# Patient Record
Sex: Male | Born: 1940 | Race: White | Hispanic: No | Marital: Married | Smoking: Heavy tobacco smoker
Health system: Southern US, Community
[De-identification: ages and names within clinical notes are randomized; demographics above are authoritative.]

## PROBLEM LIST (undated history)

## (undated) DIAGNOSIS — K5790 Diverticulosis of intestine, part unspecified, without perforation or abscess without bleeding: Secondary | ICD-10-CM

## (undated) DIAGNOSIS — I739 Peripheral vascular disease, unspecified: Secondary | ICD-10-CM

## (undated) DIAGNOSIS — N529 Male erectile dysfunction, unspecified: Secondary | ICD-10-CM

## (undated) DIAGNOSIS — J449 Chronic obstructive pulmonary disease, unspecified: Secondary | ICD-10-CM

## (undated) DIAGNOSIS — M1990 Unspecified osteoarthritis, unspecified site: Secondary | ICD-10-CM

## (undated) DIAGNOSIS — N200 Calculus of kidney: Secondary | ICD-10-CM

## (undated) DIAGNOSIS — I82409 Acute embolism and thrombosis of unspecified deep veins of unspecified lower extremity: Secondary | ICD-10-CM

## (undated) DIAGNOSIS — F419 Anxiety disorder, unspecified: Secondary | ICD-10-CM

## (undated) DIAGNOSIS — K2270 Barrett's esophagus without dysplasia: Secondary | ICD-10-CM

## (undated) DIAGNOSIS — I714 Abdominal aortic aneurysm, without rupture, unspecified: Secondary | ICD-10-CM

## (undated) DIAGNOSIS — M48061 Spinal stenosis, lumbar region without neurogenic claudication: Secondary | ICD-10-CM

## (undated) DIAGNOSIS — I10 Essential (primary) hypertension: Secondary | ICD-10-CM

## (undated) DIAGNOSIS — K449 Diaphragmatic hernia without obstruction or gangrene: Secondary | ICD-10-CM

## (undated) DIAGNOSIS — E785 Hyperlipidemia, unspecified: Secondary | ICD-10-CM

## (undated) DIAGNOSIS — K859 Acute pancreatitis without necrosis or infection, unspecified: Secondary | ICD-10-CM

## (undated) DIAGNOSIS — M109 Gout, unspecified: Secondary | ICD-10-CM

## (undated) DIAGNOSIS — I4891 Unspecified atrial fibrillation: Secondary | ICD-10-CM

## (undated) DIAGNOSIS — Z9289 Personal history of other medical treatment: Secondary | ICD-10-CM

## (undated) DIAGNOSIS — K219 Gastro-esophageal reflux disease without esophagitis: Secondary | ICD-10-CM

## (undated) DIAGNOSIS — I639 Cerebral infarction, unspecified: Secondary | ICD-10-CM

## (undated) DIAGNOSIS — D649 Anemia, unspecified: Secondary | ICD-10-CM

## (undated) DIAGNOSIS — I6529 Occlusion and stenosis of unspecified carotid artery: Secondary | ICD-10-CM

## (undated) HISTORY — DX: Peripheral vascular disease, unspecified: I73.9

## (undated) HISTORY — DX: Barrett's esophagus without dysplasia: K22.70

## (undated) HISTORY — DX: Unspecified atrial fibrillation: I48.91

## (undated) HISTORY — DX: Gastro-esophageal reflux disease without esophagitis: K21.9

## (undated) HISTORY — DX: Abdominal aortic aneurysm, without rupture, unspecified: I71.40

## (undated) HISTORY — DX: Diverticulosis of intestine, part unspecified, without perforation or abscess without bleeding: K57.90

## (undated) HISTORY — DX: Diaphragmatic hernia without obstruction or gangrene: K44.9

## (undated) HISTORY — DX: Personal history of other medical treatment: Z92.89

## (undated) HISTORY — DX: Chronic obstructive pulmonary disease, unspecified: J44.9

## (undated) HISTORY — PX: PR VEIN BYPASS GRAFT,AORTO-FEM-POP: 35551

## (undated) HISTORY — DX: Male erectile dysfunction, unspecified: N52.9

## (undated) HISTORY — DX: Unspecified osteoarthritis, unspecified site: M19.90

## (undated) HISTORY — DX: Abdominal aortic aneurysm, without rupture: I71.4

## (undated) HISTORY — DX: Anxiety disorder, unspecified: F41.9

## (undated) HISTORY — PX: OTHER SURGICAL HISTORY: SHX169

## (undated) HISTORY — DX: Essential (primary) hypertension: I10

## (undated) HISTORY — DX: Anemia, unspecified: D64.9

## (undated) HISTORY — DX: Acute embolism and thrombosis of unspecified deep veins of unspecified lower extremity: I82.409

## (undated) HISTORY — DX: Acute pancreatitis without necrosis or infection, unspecified: K85.90

## (undated) HISTORY — DX: Gout, unspecified: M10.9

## (undated) HISTORY — DX: Occlusion and stenosis of unspecified carotid artery: I65.29

## (undated) HISTORY — DX: Cerebral infarction, unspecified: I63.9

## (undated) HISTORY — DX: Spinal stenosis, lumbar region without neurogenic claudication: M48.061

## (undated) HISTORY — DX: Hyperlipidemia, unspecified: E78.5

---

## 1978-11-15 HISTORY — PX: LUMBAR DISC SURGERY: SHX700

## 1995-02-02 HISTORY — PX: CATARACT EXTRACTION: SUR2

## 1995-04-20 HISTORY — PX: CATARACT EXTRACTION: SUR2

## 1998-04-15 ENCOUNTER — Inpatient Hospital Stay (HOSPITAL_COMMUNITY): Admission: RE | Admit: 1998-04-15 | Discharge: 1998-04-18 | Payer: Self-pay | Admitting: Vascular Surgery

## 1998-10-07 ENCOUNTER — Ambulatory Visit: Admission: RE | Admit: 1998-10-07 | Discharge: 1998-10-07 | Payer: Self-pay | Admitting: Vascular Surgery

## 1999-04-13 ENCOUNTER — Encounter: Payer: Self-pay | Admitting: Vascular Surgery

## 1999-04-14 ENCOUNTER — Ambulatory Visit: Admission: RE | Admit: 1999-04-14 | Discharge: 1999-04-14 | Payer: Self-pay | Admitting: Vascular Surgery

## 1999-04-16 ENCOUNTER — Encounter: Payer: Self-pay | Admitting: Vascular Surgery

## 1999-04-16 ENCOUNTER — Inpatient Hospital Stay: Admission: RE | Admit: 1999-04-16 | Discharge: 1999-04-18 | Payer: Self-pay | Admitting: Vascular Surgery

## 1999-05-20 ENCOUNTER — Inpatient Hospital Stay (HOSPITAL_COMMUNITY): Admission: EM | Admit: 1999-05-20 | Discharge: 1999-05-23 | Payer: Self-pay | Admitting: Vascular Surgery

## 1999-08-21 ENCOUNTER — Inpatient Hospital Stay: Admission: EM | Admit: 1999-08-21 | Discharge: 1999-08-27 | Payer: Self-pay | Admitting: Vascular Surgery

## 1999-08-22 ENCOUNTER — Encounter: Payer: Self-pay | Admitting: Vascular Surgery

## 1999-08-23 ENCOUNTER — Encounter: Payer: Self-pay | Admitting: Vascular Surgery

## 2000-09-01 ENCOUNTER — Encounter: Admission: RE | Admit: 2000-09-01 | Discharge: 2000-09-01 | Payer: Self-pay | Admitting: Neurosurgery

## 2000-09-01 ENCOUNTER — Encounter: Payer: Self-pay | Admitting: Neurosurgery

## 2000-09-20 ENCOUNTER — Encounter: Payer: Self-pay | Admitting: Vascular Surgery

## 2000-09-20 ENCOUNTER — Inpatient Hospital Stay (HOSPITAL_COMMUNITY): Admission: RE | Admit: 2000-09-20 | Discharge: 2000-09-29 | Payer: Self-pay | Admitting: Vascular Surgery

## 2000-09-21 ENCOUNTER — Encounter: Payer: Self-pay | Admitting: Vascular Surgery

## 2000-11-04 ENCOUNTER — Inpatient Hospital Stay (HOSPITAL_COMMUNITY): Admission: AD | Admit: 2000-11-04 | Discharge: 2000-11-15 | Payer: Self-pay | Admitting: Vascular Surgery

## 2000-11-06 ENCOUNTER — Encounter: Payer: Self-pay | Admitting: Vascular Surgery

## 2000-11-07 ENCOUNTER — Encounter: Payer: Self-pay | Admitting: Vascular Surgery

## 2000-11-15 HISTORY — PX: CAROTID ENDARTERECTOMY: SUR193

## 2000-11-18 ENCOUNTER — Encounter: Payer: Self-pay | Admitting: Vascular Surgery

## 2000-11-18 ENCOUNTER — Inpatient Hospital Stay (HOSPITAL_COMMUNITY): Admission: RE | Admit: 2000-11-18 | Discharge: 2000-11-25 | Payer: Self-pay | Admitting: Vascular Surgery

## 2000-11-18 ENCOUNTER — Encounter (INDEPENDENT_AMBULATORY_CARE_PROVIDER_SITE_OTHER): Payer: Self-pay | Admitting: Specialist

## 2001-06-01 ENCOUNTER — Encounter: Payer: Self-pay | Admitting: Vascular Surgery

## 2001-06-02 ENCOUNTER — Inpatient Hospital Stay (HOSPITAL_COMMUNITY): Admission: RE | Admit: 2001-06-02 | Discharge: 2001-06-03 | Payer: Self-pay | Admitting: Vascular Surgery

## 2001-06-02 ENCOUNTER — Encounter (INDEPENDENT_AMBULATORY_CARE_PROVIDER_SITE_OTHER): Payer: Self-pay | Admitting: Specialist

## 2001-10-06 ENCOUNTER — Ambulatory Visit (HOSPITAL_COMMUNITY): Admission: RE | Admit: 2001-10-06 | Discharge: 2001-10-06 | Payer: Self-pay | Admitting: Cardiovascular Disease

## 2002-11-19 ENCOUNTER — Encounter: Admission: RE | Admit: 2002-11-19 | Discharge: 2002-11-19 | Payer: Self-pay | Admitting: Family Medicine

## 2002-11-19 ENCOUNTER — Encounter: Payer: Self-pay | Admitting: Family Medicine

## 2002-12-07 ENCOUNTER — Inpatient Hospital Stay (HOSPITAL_COMMUNITY): Admission: AD | Admit: 2002-12-07 | Discharge: 2002-12-11 | Payer: Self-pay | Admitting: Family Medicine

## 2002-12-08 ENCOUNTER — Encounter: Payer: Self-pay | Admitting: Family Medicine

## 2002-12-12 ENCOUNTER — Inpatient Hospital Stay (HOSPITAL_COMMUNITY): Admission: RE | Admit: 2002-12-12 | Discharge: 2002-12-20 | Payer: Self-pay | Admitting: Vascular Surgery

## 2002-12-12 ENCOUNTER — Encounter: Payer: Self-pay | Admitting: Vascular Surgery

## 2002-12-24 ENCOUNTER — Inpatient Hospital Stay (HOSPITAL_COMMUNITY): Admission: EM | Admit: 2002-12-24 | Discharge: 2003-01-01 | Payer: Self-pay | Admitting: Emergency Medicine

## 2002-12-27 ENCOUNTER — Encounter: Payer: Self-pay | Admitting: Vascular Surgery

## 2003-12-25 ENCOUNTER — Inpatient Hospital Stay (HOSPITAL_COMMUNITY): Admission: AD | Admit: 2003-12-25 | Discharge: 2004-01-01 | Payer: Self-pay | Admitting: Cardiovascular Disease

## 2005-11-15 HISTORY — PX: PR VEIN BYPASS GRAFT,AORTO-FEM-POP: 35551

## 2005-11-15 HISTORY — PX: CAROTID ENDARTERECTOMY: SUR193

## 2006-03-11 ENCOUNTER — Ambulatory Visit (HOSPITAL_COMMUNITY): Admission: RE | Admit: 2006-03-11 | Discharge: 2006-03-12 | Payer: Self-pay | Admitting: Cardiovascular Disease

## 2006-04-04 ENCOUNTER — Inpatient Hospital Stay (HOSPITAL_COMMUNITY): Admission: RE | Admit: 2006-04-04 | Discharge: 2006-04-05 | Payer: Self-pay | Admitting: Vascular Surgery

## 2006-04-04 ENCOUNTER — Encounter (INDEPENDENT_AMBULATORY_CARE_PROVIDER_SITE_OTHER): Payer: Self-pay | Admitting: Specialist

## 2006-05-02 ENCOUNTER — Ambulatory Visit (HOSPITAL_COMMUNITY): Admission: RE | Admit: 2006-05-02 | Discharge: 2006-05-02 | Payer: Self-pay | Admitting: Vascular Surgery

## 2006-05-09 ENCOUNTER — Ambulatory Visit (HOSPITAL_COMMUNITY): Admission: RE | Admit: 2006-05-09 | Discharge: 2006-05-09 | Payer: Self-pay | Admitting: Vascular Surgery

## 2006-05-09 ENCOUNTER — Encounter: Payer: Self-pay | Admitting: Vascular Surgery

## 2006-05-11 ENCOUNTER — Inpatient Hospital Stay (HOSPITAL_COMMUNITY): Admission: RE | Admit: 2006-05-11 | Discharge: 2006-05-18 | Payer: Self-pay | Admitting: Vascular Surgery

## 2007-03-06 ENCOUNTER — Encounter: Admission: RE | Admit: 2007-03-06 | Discharge: 2007-03-06 | Payer: Self-pay | Admitting: Family Medicine

## 2007-03-06 ENCOUNTER — Ambulatory Visit: Payer: Self-pay | Admitting: Internal Medicine

## 2007-03-29 ENCOUNTER — Ambulatory Visit: Payer: Self-pay | Admitting: Internal Medicine

## 2007-06-26 ENCOUNTER — Ambulatory Visit: Payer: Self-pay | Admitting: Oncology

## 2007-06-26 ENCOUNTER — Inpatient Hospital Stay (HOSPITAL_COMMUNITY): Admission: AD | Admit: 2007-06-26 | Discharge: 2007-06-30 | Payer: Self-pay | Admitting: Internal Medicine

## 2007-07-04 ENCOUNTER — Ambulatory Visit: Payer: Self-pay | Admitting: Internal Medicine

## 2007-07-06 ENCOUNTER — Ambulatory Visit: Payer: Self-pay | Admitting: Internal Medicine

## 2007-08-14 ENCOUNTER — Ambulatory Visit: Payer: Self-pay | Admitting: Surgery

## 2008-08-12 ENCOUNTER — Ambulatory Visit: Payer: Self-pay | Admitting: Surgery

## 2009-02-04 ENCOUNTER — Ambulatory Visit: Payer: Self-pay | Admitting: Surgery

## 2009-09-01 ENCOUNTER — Ambulatory Visit: Payer: Self-pay | Admitting: Vascular Surgery

## 2010-02-13 ENCOUNTER — Ambulatory Visit: Payer: Self-pay | Admitting: Vascular Surgery

## 2010-10-22 ENCOUNTER — Ambulatory Visit: Payer: Self-pay | Admitting: Vascular Surgery

## 2011-01-21 ENCOUNTER — Ambulatory Visit
Admission: RE | Admit: 2011-01-21 | Discharge: 2011-01-21 | Disposition: A | Discharge status: Home / Self Care | Payer: Medicare Other | Source: Ambulatory Visit | Attending: Cardiovascular Disease | Admitting: Cardiovascular Disease

## 2011-01-21 ENCOUNTER — Other Ambulatory Visit: Payer: Self-pay | Admitting: Cardiovascular Disease

## 2011-01-21 DIAGNOSIS — J449 Chronic obstructive pulmonary disease, unspecified: Secondary | ICD-10-CM

## 2011-03-30 NOTE — Procedures (Signed)
BYPASS GRAFT EVALUATION   INDICATION:  Follow up left femoral-to-posterior tibial bypass graft.   HISTORY:  Diabetes:  Yes.  Cardiac:  Yes.  Hypertension:  Yes.  Smoking:  Yes.  Previous Surgery:  Left femoral-to-posterior tibial bypass graft with  composite cephalic vein on 75/64/33 by Dr. Hart Rochester.   SINGLE LEVEL ARTERIAL EXAM                               RIGHT              LEFT  Brachial:                    201                188  Anterior tibial:             123                173  Posterior tibial:            111                165  Peroneal:  Ankle/brachial index:        0.61               0.86   PREVIOUS ABI:  Date: 09/01/09  RIGHT:  0.72  LEFT:  0.97   LOWER EXTREMITY BYPASS GRAFT DUPLEX EXAM:   DUPLEX:  Patent left femoral-to-posterior tibial bypass graft with  elevated velocities of 227 at the proximal anastomosis.   IMPRESSION:  1. Stable ankle brachial indices; however, left ankle brachial index      has decreased.  2. Patent left femoral-to-posterior tibial bypass graft with elevated      velocities as described above.        ___________________________________________  Quita Skye. Hart Rochester, M.D.   CB/MEDQ  D:  02/13/2010  T:  02/13/2010  Job:  [id number]

## 2011-03-30 NOTE — Procedures (Signed)
BYPASS GRAFT EVALUATION   INDICATION:  Follow-up evaluation of lower extremity bypass graft.   HISTORY:  Diabetes:  Yes.  Cardiac:  Yes.  Hypertension:  Yes.  Smoking:  Periodic smoker.  Previous Surgery:  Left fem-to-distal posterior tibial artery bypass  graft with a composite cephalic vein on 30/86/57 by Dr. Hart Rochester.   SINGLE LEVEL ARTERIAL EXAM                               RIGHT              LEFT  Brachial:                    178                176  Anterior tibial:             96  Posterior tibial:            84                 168  Peroneal:  Ankle/brachial index:        0.53               0.94   PREVIOUS ABI:  Date: 08/14/07  RIGHT:  0.52  LEFT:  0.94   LOWER EXTREMITY BYPASS GRAFT DUPLEX EXAM:   DUPLEX:  Doppler arterial waveforms are biphasic proximal to and in the  proximal portion of the bypass graft and are monophasic in the distal  portion of the bypass graft in the distal native posterior tibial  artery.   IMPRESSION:  1. Ankle brachial indices are stable from previous study bilaterally.  2. Patent left femoral-to-posterior tibial artery bypass graft with no      evidence of vein graft stenosis.   ___________________________________________  V. Charlena Cross, MD   MC/MEDQ  D:  08/12/2008  T:  08/12/2008  Job:  [id number]

## 2011-03-30 NOTE — Procedures (Signed)
BYPASS GRAFT EVALUATION   INDICATION:  Follow-up evaluation of left fem-to-posterior tibial artery  bypass graft.   HISTORY:  Diabetes:  Yes.  Cardiac:  Yes.  Hypertension:  Yes.  Smoking:  Yes.  Previous Surgery:  Left fem-to-posterior tibial artery bypass graft with  composite cephalic vein on 60/45/4098 by Dr. Hart Rochester.   SINGLE LEVEL ARTERIAL EXAM                               RIGHT              LEFT  Brachial:                    188                186  Anterior tibial:             96  Posterior tibial:            106                180  Peroneal:  Ankle/brachial index:        0.56               0.96   PREVIOUS ABI:  Date: 08/12/08  RIGHT:  0.53  LEFT:  0.94   LOWER EXTREMITY BYPASS GRAFT DUPLEX EXAM:   DUPLEX:  Doppler arterial waveforms are monophasic proximal to, within,  and distal to the left fem-to-posterior artery bypass with no evidence  of vein graft stenosis.   IMPRESSION:  1. Ankle brachial indices are stable compared to previous study      bilaterally.  2. Patent left femoral to posterior artery bypass graft.     ___________________________________________  V. Charlena Cross, MD   MC/MEDQ  D:  02/04/2009  T:  02/04/2009  Job:  [id number]

## 2011-03-30 NOTE — Procedures (Signed)
CAROTID DUPLEX EXAM   INDICATION:  Known carotid disease.   HISTORY:  Diabetes:  Yes  Cardiac:  Yes  Hypertension:  Yes  Smoking:  Yes  Previous Surgery:  Left CEA in 2001  CV History:  No  Amaurosis Fugax No, Paresthesias No, Hemiparesis No                                       RIGHT             LEFT  Brachial systolic pressure:         167               164  Brachial Doppler waveforms:         WNL               WNL  Vertebral direction of flow:        Antegrade         Antegrade  DUPLEX VELOCITIES (cm/sec)  CCA peak systolic                   57                64  ECA peak systolic                   248               407  ICA peak systolic                   103               83  ICA end diastolic                   27                30  PLAQUE MORPHOLOGY:                  Homogeneous  PLAQUE AMOUNT:                      Mild  PLAQUE LOCATION:                    ICA   IMPRESSION:  1. Right internal carotid artery suggests 20% to 39% stenosis.  2. Left internal carotid artery suggests 0% to 39% stenosis.  3. Bilateral external carotid arteries suggests stenosis.  4. Antegrade flow in bilateral vertebrals.   ___________________________________________  Quita Skye Hart Rochester, M.D.   CB/MEDQ  D:  09/02/2009  T:  09/02/2009  Job:  [id number]

## 2011-03-30 NOTE — Procedures (Signed)
BYPASS GRAFT EVALUATION   INDICATION:  Follow up left fem to posterior tibial artery bypass graft.   HISTORY:  Diabetes:  Yes  Cardiac:  Yes  Hypertension:  Yes  Smoking:  Yes  Previous Surgery:  Left fem to posterior tibial artery bypass graft with  composite cephalic vein on 16/08/9603 by Dr. Hart Rochester.   SINGLE LEVEL ARTERIAL EXAM                               RIGHT              LEFT  Brachial:                    160                152  Anterior tibial:             99                 133  Posterior tibial:            90                 141  Peroneal:  Ankle/brachial index:        0.62               0.88   PREVIOUS ABI:  Date: 02/13/2010  RIGHT:  0.61  LEFT:  0.86   LOWER EXTREMITY BYPASS GRAFT DUPLEX EXAM:   DUPLEX:  Patent fem to posterior tibial artery bypass graft with an area  of increased velocities of 156 cm/sec. in the proximal anastomosis.   IMPRESSION:  1. Patent fem to posterior tibial artery bypass graft with elevated      velocities as noted above.  2. Stable ankle-brachial indices from previous exams.      ___________________________________________  Larina Earthly, M.D.   NT/MEDQ  D:  10/22/2010  T:  10/22/2010  Job:  [id number]

## 2011-03-30 NOTE — H&P (Signed)
[name], [name]                 ACCOUNT NO.:  [id number]   MEDICAL RECORD NO.:  [id number]          PATIENT TYPE:  INP   LOCATION:  6729                         FACILITY:  [facility]   PHYSICIAN:  Michelene Gardener, MD    DATE OF BIRTH:  1941-04-16   DATE OF ADMISSION:  06/26/2007  DATE OF DISCHARGE:                              HISTORY & PHYSICAL   PRIMARY PHYSICIAN:  Robert L. Foy Guadalajara, M.D.   HISTORY OF PRESENT ILLNESS:  This is a 70 year old male with a past  medical history of multiple medical problems including anemia, was sent  by his primary physician for evaluation of anemia.  This patient has  been feeling fatigue in the last few days.  He went and saw his primary  physician around 2 days ago.  At that time, his hemoglobin was low at  9.9.  So, his primary physician told him to go home and then to come in  a few days to recheck his hemoglobin.  As per his wife, he became very  weak, having episodes of lightheadedness and was looking pale.  So, they  took him to see his primary physician and his hemoglobin came to be 6.8.  The patient was sent over to the hospital for further evaluation.  The  patient had a recent EGD and colonoscopy done by [facility], Dr. Yancey Flemings and as per the patient they were negative.   PAST MEDICAL HISTORY:  Significant for:  1. Peripheral vascular disease.  2. Anemia with recurrent transfusion, most of his workup has been      negative.  3. 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  4. Hypertension.  5. Hyperlipidemia.  6. GERD.  7. Anxiety.  8. Diabetes mellitus, type 2.  9. History of gout.   PAST SURGICAL HISTORY:  1. Status post left carotid endarterectomy in 2002.  2. Status post redo left carotid endarterectomy in May 2007.  3. Left femoral to below knee popliteal artery bypass with nonreversed      translocated saphenous vein graft, done in June 1999.  4. Status post Dacron  patch angioplasty of long vein graft stenosis of      the left femoral to popliteal bypass graft in June 2000.  5. Status post left femoral to below knee popliteal artery bypass      graft with 6-mm PTSE graft in October 2000.  6. Status post thrombectomy of left femoral to popliteal Gore-Tex      bypass graft below the knee with revision of distal aspect of left      femoral to popliteal bypass graft with extension to more distal      site in the popliteal artery using a 6-mm Gore-Tex inter-position      graft with exploration of distal anastomosis with removal of      embolic debris.  And, that was done in December 2001.  7. Status post redo of left common femoral to trunk bypass with an      arterectomy using a  6-mm Gore-Tex graft thrombectomy of the left      posterior tibial and peroneal arteries in January 2002.  8. Status post redo of the left femoral to peroneal bypass using a non-      reversed translocated saphenous vein graft from a contralateral      right leg.  This was done in February 2004.  9. Status post back surgery.   ALLERGIES:  The patient is allergic to [facility].   SOCIAL HISTORY:  He is married with 3 children.  He lives at home with  his family.  He has a history of tobacco abuse.  When he was smoking a  pack per day over the last 50 years.  He is currently on disability.  [place]   FAMILY HISTORY:  Positive for coronary artery disease in his father.   CURRENT MEDICATIONS:  1. Norvasc 5 mg p.o. once daily.  2. Lipitor 40 mg p.o. at bedtime.  3. Pletal 400 mg three times daily.  4. Catapres 0.1 mg p.o. once daily.  5. TriCor 160 mg p.o. once daily.  6. Glucotrol 5 mg p.o. twice daily.  7. Cozaar 100 mg p.o. once daily.  8. Lopressor 12.5 mg p.o. q.12 h.  9. Protonix 40 mg p.o. once daily.  10.Actos unknown dose.  11.Terazosin 10 mg p.o. once daily.  12.Xanax as needed.   REVIEW OF SYSTEMS:  As per history of present  illness.   PHYSICAL EXAMINATION:  VITAL SIGNS:  Not done yet.  HEENT:  Conjunctivae are pale.  Pupils are equal, reactive to light.  There is no ptosis.  Hearing is intact.  There is no ear discharge or  infection.  There is no nose discharge, infection, or bleeding.  Oral  mucosa is dry.  No pharyngeal erythema.  NECK:  Supple.  No JVD.  No carotid bruits.  No lymphadenopathy.  No  thyroid enlargement.  No thyroid tenderness.  CARDIOVASCULAR:  S1 and S2 are regular.  There are no murmurs, no  gallops, and no thrills.  RESPIRATORY:  The patient is breathing between 16-18.  There is no use  of accessory muscles.  No intercostal retractions.  No dullness.  No  rales.  No rhonchi.  No wheeze.  ABDOMEN:  The abdomen is soft, nondistended, no  hepatosplenomegaly.  Bowel sounds are normal.  The umbilicus is central.  LOWER EXTREMITIES:  No edema.  No rash.  No varicose veins.  SKIN:  No rash and no erythema.  NEURO:  Cranial nerves are intact from II-XII.  There is no motor or  sensory deficit.  RECTAL:  Guaiac is positive.   LABORATORY RESULTS:  His hemoglobin from the office is 8.6.  No other  labs were done so far.   IMPRESSION:  1. Gastrointestinal bleed.  This patient is guaiac positive.  This      patient underwent extensive gastroenterology workup including an      EGD and colonoscopy in April 2008.  It was negative.  Currently, he      is guaiac positive.  I am not quite sure if his INR is high, so I      will get this test, INR STAT, PTT STAT, CBC and then we will manage      him accordingly.  2. Anemia.  This is secondary to the gastrointestinal bleed.  We will      transfuse him 2 units.  Follow his CBC every 8 hours.  3. Peripheral vascular disease.  This patient has been on Coumadin for      extensive peripheral vascular disease.  I will hold this for now      until we get the results of the INR.  If the INR is high, then I      will reverse this with fresh frozen  plasma.  4. Hypertension.  We will continue his current medications.  5. Hyperlipidemia.  We will continue his current medications.  6. Diabetes mellitus.  We will continue his current medications and we      will put him on a regular insulin sliding scale.   DISPOSITION:  I will get a gastroenterology evaluation on him in the  morning, and I will continue the rest of his medications taken at home.   TOTAL ASSESSMENT TIME:  One hour.      Michelene Gardener, MD  Electronically Signed     NAE/MEDQ  D:  06/26/2007  T:  06/26/2007  Job:  [id number]   cc:   [name] L. Foy Guadalajara, M.D.

## 2011-03-30 NOTE — Assessment & Plan Note (Signed)
OFFICE VISIT   Morss, Herminio L  DOB:  1941-11-14                                       02/13/2010  ZOXWR#:[id number]   CHIEF COMPLAINT:  Followup ABIs approximately 4 years status post left  fem posterior tib bypass with cephalic vein from bilateral upper  extremities.   The patient is a 70 year old gentleman who underwent a left common  femoral to posterior tibial bypass at the level of the ankle using  composite nonreverse cephalic vein graft in both upper extremities.  He  is here today for his followup ABIs at 6 month intervals.  The patient  states he is doing well.  He has no symptoms of claudication.  He denies  any night or rest pain and otherwise is generally doing well.  He had a  small abrasion on his ankle that he received last night from the brake  pedal of his car.  Otherwise he has no nonhealing wounds or issues with  either foot.  He has no new significant medical issues.  There are no  changes in his medications.  He is a diabetic.   Vascular lab showed duplex showed patent left femoral to posterior  tibial bypass graft with elevated velocities of 227 at the proximal  anastomosis.  His ABIs are essentially stable but slightly decreased  bilaterally on 09/01/2009.  On the right it was 0.72, today it was 0.61.  in October the left was 0.97 and today was 0.86.   PHYSICAL EXAM:  General:  This is a well-developed, well-nourished  gentleman in no acute distress.  Vital signs:  Heart rate 69.  His blood  pressure is 186/80.  Saturations are 98.  He had a palpable left medial  graft pulse to the ankle and he had a palpable posterior tib pulse  beyond the distal anastomosis on the left.  He also had a palpable DP on  the left as well.  On the right he had a 1+ DP which is palpable as well  as a dopplerable posterior tibial signal on the right.  Both feet were  warm and well-perfused.  He otherwise had no other issues.   ASSESSMENT:  Patent left  femoral to posterior tibial bypass with  elevated velocities at the proximal anastomosis with slightly decreased  ABIs without symptoms.  Otherwise, the patient is stable.   PLAN:  The patient will follow up in 6 months per protocol for a repeat  duplex and ABIs.   Della Goo, PA-C   Larina Earthly, M.D.  Electronically Signed   RR/MEDQ  D:  02/13/2010  T:  02/13/2010  Job:  [id number]

## 2011-03-30 NOTE — Consult Note (Signed)
[name], [name]                 ACCOUNT NO.:  [id number]   MEDICAL RECORD NO.:  [id number]          PATIENT TYPE:  INP   LOCATION:  6729                         FACILITY:  [facility]   PHYSICIAN:  Cecille Aver, M.D.DATE OF BIRTH:  [birth year]   DATE OF CONSULTATION:  DATE OF DISCHARGE:                                 CONSULTATION   REASON FOR CONSULTATION:  Increased creatinine and anemia.   HISTORY OF PRESENT ILLNESS:  [name] is a 70 year old white male with  past medical history significant for type 2 diabetes mellitus,  hypertension, tobacco use/abuse, hyperlipidemia, gout, as well as  diffuse vascular disease.  The last creatinine that is available on the  hospital charting was a creatinine of 1.3 in July 2007.  However, when  you do scan back, there has been periods when his creatinine was greater  than 2, possibly perioperatively or related to angiograms in the past.   Patient was brought to the hospital yesterday when presented with  weakness and hemoglobin of 6.8, which was down from 9.9 the previous  Friday.  [facility] has been consulted.  He is currently being  transfused.  Apparently, patient had an EGD and colon in April which was  not revealing.  Patient is on Coumadin and did receive SFP to reverse an  INR of greater than 4.  On his initial labs, the creatinine returned at  2.69 and we were consulted to evaluate this.  Patient denies any  knowledge of previous kidney disease.  Over the weekend when he was pale  and dizzy, he was noted to have decreased urine output.  Since he has  been in the hospital and has gotten some fluids that has since resolved.  Patient does have a known history of frequent urinary tract infections,  kidney stones, but does not use excessive nonsteroidal anti-inflammatory  drugs.   PAST MEDICAL HISTORY:  1. Type 2 diabetes mellitus for 7-8 years on p.o. meds.  2. Hypertension, which does appear to be controlled on multiple  agents.  3. Hyperlipidemia.  4. Tobacco in the past and current.  5. Peripheral vascular disease, status post multiple procedures to the      left lower extremity for revascularization per Hart Rochester.  6. Status post left carotid endarterectomy.  7. Chronic back pain on chronic narcotics.   CURRENT MEDICATIONS:  1. Norvasc 5 daily.  2. Lipitor 40 daily.  3. Pletal 400 t.i.d.  4. Clonidine 0.1 daily.  5. Tricor 145 mg a day.  6. Glucotrol 5 mg b.i.d.  7. Cozaar 100 mg a day.  8. Lopressor 12.5 mg b.i.d.  9. Protonix 40 mg a day.  10.Actos 15 mg a day.  11.Hytrin 10 mg a day.  12.Percocet p.r.n.   ALLERGIES:  BETA BLOCKERS.   SOCIAL HISTORY:  Patient is married with three children.  He lives in  [place] on disability.  He does currently use tobacco.   FAMILY HISTORY:  Negative for kidney problems.   REVIEW OF SYMPTOMS:  Positive for weakness, feeling drunk, feeling  sleepy with decreased urine output, and dizziness  and pale, all of which  are improved.  He denies shortness of breath, chest pain, abdominal  pain, nausea or vomiting, diarrhea, constipation, fevers, chills, or a  rash.  Other than decreased urine output over the weekend, he denies any  hematuria, dysuria, or excessive foaminess to the urine.  All other  review of systems is negative.   PHYSICAL EXAMINATION:  VITAL SIGNS:  Afebrile, blood pressure 97/54,  heart rate 78, respirations 20, oxygen saturation 94% on room air.  CBG  range from 111 to 219.  Urine output has been greater than a liter since  admission.  GENERAL:  This is a chronically ill-appearing white male who  is male and in no acute distress.  HEENT:  Pupils equal, round and  reactive to light.  Extraocular muscles are intact.  Mucous membranes  are slightly dry.  NECK:  There is no jugulovenous distention.  LUNGS:  Clear to auscultation bilaterally without wheezes.  CARDIOVASCULAR:  Regular rate and rhythm without murmur, gallop or rub.  ABDOMEN:   Positive bowel sounds, soft, nontender.  EXTREMITIES:  Reveal no edema.  Multiple scars.  Feet are warm.  NEURO:  Patient is alert and oriented  x4.  The remainder of the neurologic exam is nonfocal.   LABORATORY FINDINGS:  White blood cell count 7.1, hemoglobin 7.7.  INR  yesterday was 4.3.  BUN and creatinine are 61 and 2.69 with a potassium  of 5, albumin 3.2, otherwise LFTs are within normal limits, calcium of  9.4.   ASSESSMENT:  This is a 70 year old white male with no known previous  significant chronic kidney disease, now presents with a GI bleed,  decreased blood pressure, and an increased creatinine level on Cozaar.   1. Increased creatinine:  I do not know what his creatinine levels      have been doing over the last year.  We will try to get them from      his primary care physician,  Dr. Ruben Reason office.  Given      recent drop in blood pressure and history of decreased urine output      over the weekend that has resolved and the fact that he was on      Cozaar, I suspect that he does have some ATN from recent event.  I      will hold his Cozaar and hold other blood pressure medicines, as      long as blood pressure is low.  He is currently getting hydrated      and getting blood, which should help his blood pressure.   I will also check a renal ultrasound, urinalysis, SPEP and UPEP to rule  out any etiology of chronic kidney disease.   1. Anemia:  He does have acute and possibly chronic anemia  Iron      stores have been checked.  I agree with that.  We will replete iron      and use Aranesp/Procrit as needed.   Thank you for this consult.  We will continue to follow with you.           ______________________________  Cecille Aver, M.D.     KAG/MEDQ  D:  06/27/2007  T:  06/28/2007  Job:  [id number]

## 2011-03-30 NOTE — Assessment & Plan Note (Signed)
OFFICE VISIT   [place]field, Mong L  DOB:  26-Sep-1941                                       10/22/2010  EAVWU#:[id number]   Date of surgery was May 11, 2006 consisting of left common femoral to  posterior tibial distal at the ankle level bypass by Dr. Hart Rochester. Non-s>  The patient a very pleasant 70 year old gentleman who is followed by [place]  following left common femoral posterior tibial bypass at the level of  the ankle using composite nonreverse cephalic vein from both upper  extremities.  He  undergoes followup ABIs at  6 month intervals.   He states that he is doing very well.  He has had no symptoms of  claudication and denies rest pain.  He has no nonhealing wounds or  issues with the left foot.  Of significance is his diabetes.  There have  been no medical issues arising over the interim 6 months.  Duplex in the  vascular lab demonstrated a patent femoral to posterior tibial artery  bypass graft with elevated velocities at the origin.  His ABIs remained  stable.  ABI on the right is 0.62 on the left 0.88 in comparison of  02/13/2010 when they were 0.61 and 0.86.   PHYSICAL EXAMINATION:  Physical findings revealed a very pleasant  elderly gentleman who walks with a cane.  Heart rate was 77, blood  pressure 157/66, O2 saturations 97%.  HEENT:  PERRLA, EOMI with normal  conjunctiva.  Lungs:  Clear.  Cardiac exam:  Revealed a regular rate and  rhythm.  ABDOMEN:  Soft, nontender, nondistended.  There are no major  deformities of his musculoskeletal system.  Neurologically:  He was  nonfocal.  I appreciated no rashes or ulcers.  There was a good pulse in  the endograft.  There was no tissue loss in  either leg.   ASSESSMENT:  He has a patent femoral to posterior tibial bypass with  elevated velocities at the proximal anastomosis.  He is otherwise  stable.   PLAN:  Follow n 6 months per protocol for repeat duplex and ABIs.   [name] Arms, PA   [name] Hora.  Fields, MD  Electronically Signed   KEL/MEDQ  D:  10/22/2010  T:  10/22/2010  Job:  [id number]

## 2011-03-30 NOTE — Procedures (Signed)
BYPASS GRAFT EVALUATION   INDICATION:  Follow up left femoral to posterior tibial bypass graft.   HISTORY:  Diabetes:  Yes  Cardiac:  Yes  Hypertension:  Yes  Smoking:  Yes  Previous Surgery:  Left femoral to posterior tibial bypass graft with  composite cephalic vein on 1/61/0960 by Dr. Hart Rochester.   SINGLE LEVEL ARTERIAL EXAM                               RIGHT              LEFT  Brachial:                    167                164  Anterior tibial:             120                162  Posterior tibial:            108                156  Peroneal:  Ankle/brachial index:        0.72               0.97   PREVIOUS ABI:  Date: 02/04/2009  RIGHT:  0.53  LEFT:  0.94   LOWER EXTREMITY BYPASS GRAFT DUPLEX EXAM:   DUPLEX:  Patent left femoral to posterior tibial bypass graft with  elevated velocities of 201 cm/sec. at proximal anastomosis.   IMPRESSION:  1. Increased ankle-brachial indices from previous study; however,      blood pressure is lower.  2. Patent left femoral to posterior tibial bypass graft with elevated      velocities at proximal anastomosis site.   ___________________________________________  Quita Skye. Hart Rochester, M.D.   CB/MEDQ  D:  09/02/2009  T:  09/02/2009  Job:  [id number]

## 2011-03-30 NOTE — Procedures (Signed)
BYPASS GRAFT EVALUATION   INDICATION:  Follow up left fem-pop bypass graft.   HISTORY:  Diabetes:  Yes.  Cardiac:  Yes.  Hypertension:  Yes.  Smoking:  Occasionally.  Previous Surgery:  Please see above.   SINGLE LEVEL ARTERIAL EXAM                               RIGHT              LEFT  Brachial:                    163                162  Anterior tibial:             84                 154  Posterior tibial:            79                 144  Peroneal:  Ankle/brachial index:        0.52               0.94   PREVIOUS ABI:  Date: 10/25/06  RIGHT:  0.48  LEFT:  0.94   LOWER EXTREMITY BYPASS GRAFT DUPLEX EXAM:   DUPLEX:  Patent left fem-pop bypass graft with no evidence of focal  stenosis.   IMPRESSION:  1. Patent left femoropopliteal bypass graft with no evidence of focal      stenosis.  2. Mildly abnormal ankle brachial indices with biphasic Doppler      waveform noted in the left leg, status post left femoropopliteal      bypass graft.  3. Moderately abnormal ankle brachial index with monophasic Doppler      waveform noted in the right leg.   ___________________________________________  Quita Skye. Hart Rochester, M.D.   MG/MEDQ  D:  08/14/2007  T:  08/15/2007  Job:  [id number]

## 2011-03-30 NOTE — Consult Note (Signed)
[name], DOWIS                 ACCOUNT NO.:  [id number]   MEDICAL RECORD NO.:  [id number]          PATIENT TYPE:  INP   LOCATION:  6729                         FACILITY:  [facility]   PHYSICIAN:  Pierce Crane, M.D.      DATE OF BIRTH:  1941/11/10   DATE OF CONSULTATION:  DATE OF DISCHARGE:                                 CONSULTATION   PROBLEM:  Anemia.   HISTORY OF PRESENT ILLNESS:  This 70 year old gentleman has a history of  multiple medical problems, with a history of chronic anemia dating back  at least five to six years.  He has had multiple gastrointestinal  investigations secondary to positive stools for occult blood.  Despite  that, he has never had an obvious source of bleeding.  They are  suspicious that he has chronic cycles of bleeding secondary to being on  Coumadin.  He was recently admitted with a hemoglobin of 6.8 prior to  that, four days before his hemoglobin of 9.9.  He subsequently became  more lightheaded and was admitted for that reason.  He did have stools  that were positive for occult blood and subsequently had been seen by GI  again and there has been some discussion about a capsule endoscopy test.  Furthermore, while on admission, he was noted to have an elevated  creatinine, and so nephrology has been involved with his case as well.   PAST MEDICAL HISTORY:  Past medical history is significant for  peripheral vascular disease, status post multiple procedures, including  carotid endarterectomies, femoral-popliteal bypass, angioplasties,  bypass surgeries, thrombectomies, re-dos of femoral tibial bypasses.  He  also has a history of chronic back pain status post surgery.  He has had  chronic pain medication as a result.  He has a history of type 2  diabetes, history of gout, history of gastroesophageal reflux disease,  hyperlipidemia, and hypertension.  He is currently on disability.   ALLERGIES:  LOPRESSOR.   SOCIAL HISTORY:  He lives with his wife in  [place].  He has three  children.  He smokes about a pack a day and has done so for 50 years.   MEDICATIONS:  Medications are as listed in the chart.   REVIEW OF SYSTEMS:  He had some chronic problems with shortness of  breath.  He denies any obvious bleeding that he has noted.  He denies  any headache or blurry vision.  He denies any numbness or tingling in  the hands or feet.  He has had some easy bruising secondary to being on  chronic Coumadin.   PHYSICAL EXAMINATION:  GENERAL:  Physical examination reveals a pleasant  gentleman.  He has some dyspnea at rest.  VITAL SIGNS:  His blood pressure is 129/49, temperature is 98, pulse 74,  respirations 28, saturation 96%.  He has no palpable peripheral  adenopathy.  HEENT:  No scleral icterus.  Oropharynx is normal.  Trachea is midline.  No thyromegaly.  LUNGS:  The lungs are clear.  HEART:  Heart sounds are normal.  ABDOMEN:  He has no palpable hepatosplenomegaly.  GENITALIA:  He has no gonadal adenopathy.  EXTREMITIES:  The extremities show some ecchymotic areas on his arms.   LABORATORY DATA:  On the most recent laboratories, his hemoglobin was  9.6, creatinine is 2, International Normalized Ratio is 2.  Ferritin was  elevated at 586, serum iron of 51.  We do not have a transferrin level.  B12 is 412.  A folate is pending.  Serum protein electrophoresis has  been drawn.   IMPRESSION AND PLAN:  This gentleman has a history of chronic anemia  dating back many years, and certainly this is anemia which is chronic  and is related to his gastrointestinal bleeding, which may be mutual in  nature secondary to being on chronic Coumadin.  Looking back on the  hospital records, his creatinine has been elevated in the past and it is  certainly feasible that he has multifactorial causes of anemia,  including renal insufficiency.  We will check an Epo level and start him  on Aranesp.  Will wait and try to rule out hemolysis on review of a   peripheral smear.  He will likely require long-term injections.  I think  a capsule endoscopy is a good idea as well.  Ultimately, if he has no  obvious response to erythropoietin and/or his Epo level is not  consistent with renal insufficiency, may be inclined to perform a bone  marrow biopsy to rule out a myelodysplastic syndrome.  Will follow him  while in the hospital.      Pierce Crane, M.D.  Electronically Signed     PR/MEDQ  D:  06/28/2007  T:  06/29/2007  Job:  [id number]   cc:   Mikki Harbor, MD  Wilhemina Bonito. [name], MD  Nanetta Batty, M.D.

## 2011-04-02 NOTE — Discharge Summary (Signed)
[place]  Patient:    [name], [name]                        MRN: [id number] Adm. Date:  [id number] Disc. Date: [id number] Attending:  Colvin Caroli Dictator:   Loura Pardon, P.A. CC:         Teena Irani. Arlyce Dice, M.D.   Discharge Summary  DATE OF BIRTH:  [birth year]  PRIMARY CARE GIVER:  Teena Irani. Arlyce Dice, M.D.  FINAL DIAGNOSIS:  Thrombosed left femoropopliteal bypass with ischemic left leg.  SECONDARY DIAGNOSES:  1. Known history of infrainguinal arterial occlusive disease, status post     previous revascularization of the left lower extremity on chronic Coumadin     therapy.  2. History of atherosclerotic coronary artery disease, status post myocardial     infarction in 1970.  3. Hypertension.  4. History of peptic ulcer disease/recurrent gastroesophageal reflux disease.  5. Current long-term history of tobacco habituation.  6. Chronic obstructive pulmonary disease.  7. Gout.  8. History of ethanol use/pancreatitis.  9. Status post back surgery in 1989. 10. Status post bilateral cataract implants in June of 1996.  PROCEDURES:  On November 18, 2000, redo of left common femoral artery to tibioperoneal trunk bypass with placement of a 6 mm Gore-Tex conduit, intraoperative arteriogram, and thrombectomy of the left posterior tibial and left perineal arteries.  Quita Skye Hart Rochester, M.D., surgeon.  DISCHARGE DISPOSITION:  [name] is a suitable candidate for discharge on postoperative day #7.  His graft has remained patent in the postoperative period.  He is ambulating independently.  He has been afebrile in the postoperative period.  He is not requiring any supplemental oxygen.  He has been given Coumadin starting on postoperative day #2 and has achieved a therapeutic level at discharge.  He will resume his home Coumadin dosing, which is 7.5 mg daily.  He did receive two units of packed red blood cells intraoperatively.  Ankle brachial indexes  were obtained on postoperative day #3.  On the left, they were 0.89.  Prior to surgery, they were 0.15 and on the left they were 0.74.  He also began mobilizing on postoperative day #3 and has had no difficulty with ambulation since that time.  His pain was controlled with Percocet.  He has been given his Glucotrol daily while hospitalized.  DISCHARGE MEDICATIONS:  1. Percocet 5/325 mg p.o. q.4-6h. p.r.n. pain.  2. Norvasc 5 mg daily.  3. Glucotrol XL 10 mg daily.  4. Prilosec 20 mg in the morning.  5. Sublingual nitroglycerin as needed.  6. Nortriptyline 15 mg daily as needed.  7. Pravachol 80 mg at bedtime daily.  8. Diovan 300 mg one half tablet daily.  9. Xanax 0.5 mg tablets as needed up to three a day. 10. Coumadin 7.5 mg daily.  DISCHARGE ACTIVITY:  Ambulate as tolerated often.  He is to elevated the left lower extremity when in bed or when sitting down.  He is asked not to drive for two weeks.  DISCHARGE DIET:  Low-sodium, low-cholesterol, ADA diet.  WOUND CARE:  He may shower daily, keeping his incisions clean and dry.  FOLLOW-UP:  He has an office visit with the [facility] on Friday, December 02, 2000, at 10 a.m. for staple removal.  He will see Quita Skye. Hart Rochester, M.D., on December 06, 2000, Tuesday, at 2:10 p.m.  He also has an office visit with Teena Irani.  Arlyce Dice, M.D., at [facility] on Monday, November 28, 2000, at 8 a.m. in the morning to get a blood test, pro time/INR, to ascertain Coumadin levels.  BRIEF HISTORY:  [name] is a 70 year old male who has had multiple previous revascularization procedures in the left lower extremity.  The most recent procedure was performed on November 07, 2000.  During this time, the patient underwent thrombectomy on his left femoropopliteal Gore-Tex bypass with intraoperative arteriograms x 2.  He also underwent revision of the distal aspect of the left femoral to popliteal bypass with  extension to a more distal site on the popliteal artery.  He also underwent an exploration of the distal anastomosis with removal embolic debris.  He was discharged from this most recent procedure on November 14, 2000.  He presented on November 19, 1999, complaining of an acutely cold left foot.  He states that he did not have much pain, but due to the coldness of the foot, he was seen emergently at the [facility] office by Quita Skye. Hart Rochester, M.D., who felt that the best course of action was thrombectomy and possible revision of the left femoropopliteal bypass.  HOSPITAL COURSE:  [name] was admitted to [facility] on November 18, 2000.  He presented with an ischemic left leg and thrombosis of the left femoropopliteal bypass.  He went to surgery the same day for redo left common femoral artery to tibioperoneal trunk bypass with placement of a 6 mm Gore-Tex conduit.  He was transfused with two units of packed red blood cells for acute blood loss anemia intraoperatively.  On postoperative day #1, the hematocrit was 30%.  After transfusion, the creatinine was 1.0.  He had excellent Doppler signals in the posterior tibial and perineal regions of the left foot.  He was on a PCA morphine pump and was placed on heparin postoperatively and continued on heparin until his Coumadin levels were therapeutic.  This was on postoperative day #7.  On postoperative day #2, the JP drains were removed and he was started on his Coumadin.  Ankle brachial indexes, as dictated above, were obtained on postoperative day #3. On postoperative day #4, the PCA morphine pump was discontinued and he achieved good analgesia with oral medications.  By postoperative day #5, it became obvious that he was proving difficult to reestablish at a therapeutic anticoagulation level.  He had received fairly high doses of Coumadin while here at [facility].   However, he will go home on his  regular Coumadin dose at 7.5 mg daily.  He was achieving 96% oxygen saturation on room air by postoperative day #6 and ambulating independently.  His appetite was good.  He had full GI function.  The incisions were healing nicely.  There was no evidence of erythema, swelling, or drainage.  He goes home on November 25, 2000, on Coumadin and with follow-up with Quita Skye. Hart Rochester, M.D., and Teena Irani. Arlyce Dice, M.D., for a pro time on Monday, November 28, 2000. DD:  11/24/00 TD:  11/25/00 Job: [id number]

## 2011-04-02 NOTE — Discharge Summary (Signed)
[name], [name]                 ACCOUNT NO.:  [id number]   MEDICAL RECORD NO.:  [id number]          PATIENT TYPE:  INP   LOCATION:  3302                         FACILITY:  [facility]   PHYSICIAN:  Quita Skye. Hart Rochester, M.D.  DATE OF BIRTH:  Mar 27, 1941   DATE OF ADMISSION:  04/04/2006  DATE OF DISCHARGE:  04/05/2006                                 DISCHARGE SUMMARY   ADMISSION DIAGNOSIS:  Recurrent left carotid artery stenosis.   DISCHARGE/SECONDARY DIAGNOSES:  1.  Recurrent left internal carotid artery stenosis status post redo left      carotid endarterectomy.  2.  Left great toe pain most likely secondary to gout with uric acid level      still pending.  3.  Peripheral vascular occlusive disease status post left femoral peroneal      artery bypass in February 2004, status post multiple revisions and  is      now on Coumadin therapy.  4.  Chronic back pain with history of back surgery.  5.  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  6.  Hypertension.  7.  Hyperlipidemia.  8.  Gastroesophageal reflux disease.  9.  Anxiety.  10. Diabetes mellitus type 2, non-insulin dependent.  11. History of gout.  12. Reports allergy to LOPRESSOR.  13. Chronic renal insufficiency.   BRIEF HISTORY:  [name] is a 70 year old Caucasian male who underwent left  carotid endarterectomy by Dr. Hart Rochester in 2002.  He has had no neurological  symptoms since then.  He has been followed by Dr. Alanda Amass for his carotid  disease with occasional duplex scans.  He recently had evidence of severe  recurrent stenosis of the left internal carotid artery confirmed by  angiogram done by Dr. Alanda Amass.  He did report one episode of dizziness  about 2 weeks prior to admission was which lasted about 30 seconds but  otherwise has been asymptomatic. Denied blurred vision, dysphasia, seizure,  syncope, presyncope memory loss, confusion, headaches, nausea and  vomiting,  vertigo, muscle weakness, numbness, tingling,  diplopia.  He is also stable  from a cardiac standpoint.  Cerebral angiogram showed widely patent  carotid  bifurcation and a left focal 90% stenosis about 3-4 cm  above the  bifurcation which could be at the tip of the patch, or possibly a clamp  injury.  He is referred to Dr. Quita Skye.  Hart Rochester for consideration of redo  left carotid endarterectomy.  Dr. Hart Rochester did feel that redo left carotid  endarterectomy was the best treatment option and after discussing risks and  benefits, the patient agreed to proceed.   HOSPITAL COURSE:  [name] was electively admitted to [facility]  Apr 04, 2006 and underwent a redo left carotid endarterectomy with Dacron  patch angioplasty by Dr. Quita Skye.  Hart Rochester.  Based on intraoperative findings  Dr. Hart Rochester did not feel that his recurrent left carotid disease was due to a  clamp injury, rather to atherosclerotic changes.  Postoperatively, [name]  was extubated neurologically intact and after  a short stay in the recovery  unit was transferred to step-down unit 3300 where it is  anticipated that he  will remain until discharge.   REVIEW OF SYSTEMS:  On the morning of postoperative day #1 he remained  stable with  blood pressures showing 130/50, heart rate in sinus rhythm in  the 60s and 70s.  He is afebrile and saturating 99% on 2 liters per nasal  cannula and it is  not anticipated that we will have any difficulty weaning  him from supplemental oxygen.  His morning blood sugar was 85.  He is also  voiding without difficulty and able to tolerate p.o.'s without any  complaints of dysphagia.  Labs were also stable, showing white blood count  7.6, hemoglobin 9.8, hematocrit 28.3, platelet count 188, sodium 140,  potassium 4.2, BUN and creatinine 26, 1.7 respectively, which were improved  from his preop creatinine of 2.3.  His incision also was clean, dry, intact  without evidence of hematoma.   He he did however complain of left great toe  pain which had been present for about 3 weeks.  He was concerned that this  was acute gout attack as he has experienced in the past.  At the time his  toe was not particularly red and swollen but he said it  had been last week.  Due to his history of left femoral peroneal bypass graft with multiple  revisions, graft occlusion needed to be ruled out.  He did have a palpable  femoral pulse and brisk Doppler popliteal signal on the left with faint  Doppler signals on the posterior tibial of the dorsalis pedis  and posterior  tibial.  He denied any numbness or tingling of his foot or heaviness and  reported no changes in his walking habits although still limited due to his  chronic back pain.  Since his graft was felt to be currently patent, his  left great toe pain was felt probably likely to gout.  A uric acid level was  ordered but results are still pending at the time of this dictation.  He was  also prescribed a 5-day course of indomethacin.  For completeness follow-up  lower extremity Dopplers with ABIs will be repeated as follow-up appointment  2 weeks.  He will also resume his Coumadin.   If [name] is able to ambulate without difficulty and there are no other  acute changes in status it is  anticipated he will be ready to go home on  postop day #1, Apr 05, 2006.   DISCHARGE MEDICATIONS:  1.  Tylox 1-2 tablets p.o. q. 4-6 hours p.r.n. pain.  The patient also has      Lorcet Plus at home which he uses  1 to 1-1/2  tablets up to four time a      day.  He is instructed that he may use his Lorcet instead of the Tylox      if he desires but should not take the Lorcet and Tylox together due to      increased ingestion of Tylenol.  2.  Metoprolol 25 mg p.o. 1/2 tablet b.i.d.  3.  Terazosin 10 mg p.o. q.d.  4.  Felodipine 5 mg one p.o. q.d.  5.  Irbesartan 300 mg p.o. daily.  6.  Protonix 40 mg daily. 7.  Alprazolam 0.5 mg p.o. t.i.d.  8.   __________ sulfate 260 mg p.o. q.h.s. p.r.n.  9.  Folic acid  one p.o. q.d.  10.  Coumadin 6 mg on Mondays and 5 mg on other days.  11. Aspirin 81 mg p.o. q.d.  12. Actos 30 mg one p.o. daily.  13. Pravachol  80 mg one p.o. q.d.  14. Pletal 50 mg p.o. t.i.d.  15. Tricor 1600 mg p.o. q.d.  16. Glipizide 5 mg p.o. q.d.  17. Reserpine 0.1 mg p.o. q.d.  18. Indocin 50 mg p.o. t.i.d. x 5 days.   DISCHARGE INSTRUCTIONS:  He is to continue a low-fat, low-salt diet.  He may  shower starting May 23 and clean his incisions with soap and water, should  notify the [facility] office if he develops fever greater than 101 or redness or  drainage from incision site,  to avoid driving or heavy lifting for the next  2 weeks.  He is to follow with Dr. Hart Rochester at [facility] office on April 26, 2006.  Currently the appointment scheduled for 0930 and lower extremity  Doppler studies with ABIs have been added.  The [facility] office will contact him  with any appointment time changes due to his addition of the vascular lab  study. He is to call and schedule a PT/INR follow up with Dr. Foy Guadalajara on  Friday May 25 or Tuesday, May 29.      Jerold Coombe, P.A.    ______________________________  Quita Skye Hart Rochester, M.D.    AWZ/MEDQ  D:  04/05/2006  T:  04/05/2006  Job:  [id number]   cc:   Gerlene Burdock A. Alanda Amass, M.D.  Fax: [phone]   Doris Cheadle. Foy Guadalajara, M.D.  Fax: [phone]

## 2011-04-02 NOTE — Op Note (Signed)
NAMETAYVIAN, HOLYCROSS                 ACCOUNT NO.:  [id number]   MEDICAL RECORD NO.:  [id number]            PATIENT TYPE:   LOCATION:                                 FACILITY:   PHYSICIAN:  Quita Skye. Hart Rochester, M.D.       DATE OF BIRTH:   DATE OF PROCEDURE:  04/04/2006  DATE OF DISCHARGE:                                 OPERATIVE REPORT   PREOP DIAGNOSIS:  Recurrent severe left internal carotid stenosis--  asymptomatic.   POSTOP DIAGNOSIS:  Recurrent severe left internal carotid stenosis--  asymptomatic.   OPERATIONS:  A redo left carotid endarterectomy with Dacron patch  angioplasty.   SURGEON:  Quita Skye. Hart Rochester, M.D.   FIRST ASSISTANT:  Pecola Leisure, PA   ANESTHESIA:  General endotracheal.   BRIEF HISTORY:  This patient underwent a left carotid endarterectomy in 2002  and has remained asymptomatic since that time.  He has been followed with  periodic duplex scanning; and has now progressed to a 80-90% stenosis of the  left internal carotid artery.  This was confirmed by angiography by Dr.  Alanda Amass which revealed what appeared to be a focal stenosis in the distal  aspect of the patch of the internal carotid endarterectomy.  He was  scheduled for redo surgery.   DESCRIPTION OF PROCEDURE:  The patient was taken to the operating room,  placed in the supine position at which time satisfactory general  endotracheal anesthesia was administered.  The left neck was prepped with  Betadine scrub and solution and draped in a routine sterile manner.  Incision was made through the previous scar in the left neck, and carried  down through subcutaneous tissue and platysma.  Common carotid artery was  dissected free proximally for control.  Care was taken not to injure the  vagus or hypoglossal nerves both of which were exposed.  The endarterectomy  had been closed with a patch; and the patch was completely dissected free  and easily controlled of the internal carotid artery distal to the  patch was  obtained with exposure of an additional 3-4 cm of vessel, because the  patient had a very low bifurcation.  External carotid was also dissected  free.   Then 6000 units of heparin was given intravenously; and the patient was  heparinized carotid vessels were occluded with vascular clamps.  Longitudinal opening made in the common carotid with a 15 blade; extended  through the patch throughout its entire length.  There was diffuse recurrent  atherosclerosis within the entire endarterectomized segment which was soft  and yellowish and very friable with a focal area of build up of recurrent  atherosclerosis at the tip of the patch causing an approximate 90% stenosis.  This was very soft and friable, and was easily removed with spatula.  A #10  shunt was inserted without difficulty reestablishing flow in about 2  minutes.  The endarterectomized segment was carefully examined and it was  felt necessary to completely redo the endarterectomy because of this  recurrent atherosclerosis.  Initially the old patch was excised from the  vessel and a redo endarterectomy was performed from a common carotid artery  just proximal to the previous endarterectomy to the internal carotid artery  just distal to the previous endarterectomy.  This left a very smooth  surface.  After thorough irrigation with heparin and saline a new patch was  sewn into place with 6-0 Prolene.  Prior to completion of the closure the  shunt was removed after about 35 minutes of shunt time.  Following antegrade  and retrograde flushing, the closure was completed with reestablishment of  flow initially up the external and then up the internal branch.  The carotid was occluded for less than 2 minutes for removal of the shunt.  Protamine was then given to reverse the heparin.  Following adequate  hemostasis the wound was irrigated with saline, closed in layers with Vicryl  in a subcuticular fashion and sterile dressing  applied.  The patient taken  to recovery room in satisfactory condition.           ______________________________  Quita Skye Hart Rochester, M.D.     JDL/MEDQ  D:  04/04/2006  T:  04/04/2006  Job:  [id number]   cc:   Gerlene Burdock A. Alanda Amass, M.D.  Fax: [phone]

## 2011-04-02 NOTE — H&P (Signed)
[place]  Patient:    [name], [name]                        MRN: [id number] Adm. Date:  [id number] Disc. Date: [id number] Attending:  Colvin Caroli Dictator:   Adair Patter, P.A.-C.                         History and Physical  CHIEF COMPLAINT:  Ischemic left foot.  HISTORY OF PRESENT ILLNESS:  [name] is a 70 year old man, who has had multiple previous revascularization procedures for his left lower extremity. His most recent procedure was performed on November 07, 2000.  During this time, the patient underwent a thrombectomy of his left femoral/popliteal Gore-Tex bypass, with intraoperative arteriogram x 2.  He also underwent a revision of the distal aspect of the left femoral/popliteal bypass graft with extension to a more distal site of the popliteal artery.  In addition to that, he also underwent an exploration of the distal anastomosis, with the removal of embolic debris.  He was previously discharged from his most recent procedure on November 14, 2000.  He presented on November 18, 2000, complaining of an acutely cold left foot.  He states that he did not have much pain at this time.  Because of the coldness in his left foot, he was seen emergently at the [facility] by Dr. Quita Skye. Hart Rochester, who evaluated the patient and felt the best course of action was a thrombectomy and possible revision of his left femoral/popliteal bypass graft.  PAST MEDICAL HISTORY:  1. Peripheral vascular disease.  2. Coronary artery disease.  3. Status post myocardial infarction in 1970.  4. Hypertension.  5. Peptic ulcer disease.  6. Chronic obstructive pulmonary disease.  7. Tobacco use.  8. Gout.  9. Peripheral neuropathy. 10. History of pancreatitis.  PAST SURGICAL HISTORY:  1. The patient has undergone multiple revascularization procedures for      his left lower extremity.  2. In addition, he has undergone back surgery in 1989.  3. Bilateral  cataract implants in June 1996.  ALLERGIES:  The patient denies any known medication allergies.  CURRENT MEDICATIONS:  1. Tylox one or two tablets q.4-6h. p.r.n. pain.  2. Norvasc 5 mg one tablet q.d.  3. Glucotrol 10 mg one tablet q.d.  4. Prilosec 20 mg one q.a.m., two q.p.m.  5. Xanax 3.5 mg one tablet t.i.d.  6. Sublingual nitroglycerin p.r.n.  7. Nortriptyline 15 mg one tablet q.d.  8. Coumadin.  The patient was discharged home taking 5 mg to 7.5 mg,     which was to be directed by his previous INR.  This dose will have     to be adjusted before the patient is discharged from this hospitalization.  REVIEW OF SYSTEMS:  The patient states he has no recent chest pain or shortness of breath.  He does report a history of impotence and a history of lumbar disk disease.  Otherwise the review of systems is negative.  FAMILY HISTORY:  The patient states his died of coronary artery disease.  SOCIAL HISTORY:  The patient is married, with three children.  He smokes 1-1/2 packs of cigarettes per day, and has been smoking for 40 years.  PHYSICAL EXAMINATION:  VITAL SIGNS:  The patient had a temperature of 98.2 degrees, blood pressure 164/75, heart rate 75 and regular, respirations 12.  GENERAL:  The patient is  alert and oriented x 3.  The patient appears to e well-developed, well-nourished, and in no stated apparent distress.  HEENT:  Head normocephalic, atraumatic.  Eyes:  Pupils equal, round, reactive to light and accommodation.  Extraocular movements intact without nystagmus or scleral icterus.  Ears:  Auditory acuity is grossly intact.  Nose:  Sinuses nontender.  Nasal patency is intact.  Mouth:  The mouth and throat are without exudates.  NECK:  Supple, without carotid bruits, thyromegaly, or lymphadenopathy, or jugular venous distention.  LUNGS:  Clear to auscultation bilaterally.  HEART:  A regular rate and rhythm without murmurs, gallops, or rubs.  ABDOMEN:  Soft,  nontender, nondistended.  Positive bowel sounds in all four quadrants.  EXTREMITIES:  No cyanosis, clubbing, or edema.  His left foot was cool compared to his right foot.  Peripheral pulse examination revealed absent posterior tibial and dorsalis pedis pulses on the left foot.  He also had absent popliteal pulse on the left side.  On the right foot he had a palpable dorsalis pedis pulse and posterior tibial pulse.  He had 2+ femoral pulses bilaterally.  Also noted on his left lower extremity was a recent incision in his left groin, and left popliteal area that was healing well.  NEUROLOGIC:  The patient is alert and oriented x 3.  Cranial nerves II-XII are grossly intact, and there are no focal neurological deficits.  IMPRESSION:  Occlusion of the left femoral/popliteal bypass graft despite Coumadin therapy.  PLAN:  The patient will be admitted to [facility] under the care of Dr. Hart Rochester.  He will undergo a thrombectomy and possible revision of this left femoral/popliteal bypass graft on November 18, 2000. DD:  11/18/00 TD:  11/18/00 Job: [id number]

## 2011-04-02 NOTE — Discharge Summary (Signed)
NAMEAADVIK, ROKER                 ACCOUNT NO.:  [id number]   MEDICAL RECORD NO.:  [id number]          PATIENT TYPE:  INP   LOCATION:  6729                         FACILITY:  [facility]   PHYSICIAN:  Kela Millin, M.D.DATE OF BIRTH:  09/26/1941   DATE OF ADMISSION:  06/26/2007  DATE OF DISCHARGE:  06/30/2007                               DISCHARGE SUMMARY   DISCHARGE DIAGNOSIS:  1. Anemia, unclear etiology -  Per gastroenterology, noted that the      patient had an EGD in May of 2008 which showed a hiatal hernia, but      otherwise normal mucosa.  The patient also had been a colonoscopy      in May 2008 and a GI 15 cm into the ileum all within normal limits.  2. Acute renal failure.  Per nephrology component of ATN from      hypotension and also medications.  3. Diabetes mellitus.  4. History of hypertension.  5. Hyperlipidemia.  6. Gastroesophageal reflux disease.  7. History of gout.  8. History of anxiety disorder.  9. History of peripheral vascular disease.  10.History of left internal carotid artery stenosis with, status post      carotid endarterectomy.  11.Hypokalemia, resolved.  12.History of chronic pancreatitis/pseudocyst.  13.History of chronic back pain, on chronic pain medications.   PROCEDURES AND STUDIES:  1. Renal ultrasound -- bilateral renal parenchymal thickening with      relative presentation of size consistent with medical renal      illness.  2. 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  No      acute findings.  Extensive atherosclerosis of the abdominal      visceral vasculature.  No evidence of retroperitoneal hemorrhage.      Cholelithiasis without cholecystitis.  On the CT of the pelvis      calcifications and surgical clips associated with the left common      femoral artery possibly relating to bypass graft of vascular      surgery noted.  Osteopenia with  degenerative disease of the lumbar      spine and hips, severe atherosclerosis.   CONSULTATIONS:  1. Gastroenterology, Dr. [name].  2. Hematology, Dr. Pierce Crane.  3. Nephrology, Dr. Caryn Section.   HISTORY:  The patient is a 70 year old male with past medical history  significant for the above listed multiple medical problems who presented  with complaints of fatigue in the few days prior to admission.  It was  reported that the patient had seen his primary care physician 2 days  prior to admission, and at that time his hemoglobin was 9.9 so his  primary care physician asked him to come back in the few days to have  his H&H rechecked.  The patient became very weak, and also reported  episodes of lightheadedness, and was looking pale, so he went back to  see his primary care physician on the day of admission, and his  hemoglobin was checked and found to  be 6.8.  He was admitted to the  hospital for further evaluation and management.   Please see the H&P dictated on June 26, 2007 for the details of the  admission physical exam as well as the laboratory data.   HOSPITAL COURSE:  Problem #1:  Anemia with heme-positive stools,  recurrent.  It was noted upon admission that the patient sees Dr. [name]  at [facility], and had an EGD as well as colonoscopy done  in May, and the results as stated above.  The patient was transfused  upon admission.  Gastroenterology was consulted and Dr. [name] followed  the patient.  He recommended that hematology be consulted and this was  done.  Dr. Donnie Coffin saw the patient and his impression was that this  anemia, which is chronic, was related to his GI bleeding which he stated  was likely secondary to him being on chronic Coumadin.  He checked the  patient's EPO level and stated that he would consider starting him on  Aranesp, but following that study.  He indicated that he was not going  to start the patient on Aranesp and the patient was to follow up  with  him in clinic.  The patient's Coumadin had been held on admission and  following his transfusions, his H&H was monitored, and after it  stabilized and the patient was not having any signs of GI bleeding, Dr.  [name] recommended restarting the Coumadin and this was done.  The  patient's last hemoglobin prior to discharge was 11.5, and Dr. [name]  scheduled the patient for a capsule endoscopy outpatient on July 06, 2007.   Problem #2:  ACUTE RENAL FAILURE.  Upon admission the patient had a  renal ultrasound, and the results are as stated above, he was also  hydrated/transfused as already discussed above.  Nephrology was  consulted to see the patient and [facility] followed the patient.  His Cozaar as well as all of his antihypertensives were held while he  was hypotensive in the hospital, but once his blood pressures rebounded,  and his creatinine improved; his antihypertensives were resumed.  His  creatinine upon discharge had improved to 1.4, and he was to followup as  an outpatient with his primary care physician as well as Dr.  Catarina [facility] as needed.   Problem #3:  HISTORY OF HYPERTENSION.  The patient was noted to be  hypotensive in the hospital, and so his antihypertensives were held; and  he was hydrated/transfused as above.  Subsequently his blood pressures  stabilized, and he then was restarted on his outpatient medications with  continued monitoring of his blood pressures.  The impression was that  this was secondary to #1.   Problem #4:  HISTORY OF CORONARY ARTERY DISEASE/SEVERE ATHEROSCLEROTIC  PERIPHERAL VASCULAR DISEASE.  The patient had been on Coumadin and this  was held on admission, once his H&H is stabilized the Coumadin was  resumed as per GI recommendations.  The patient was to followup with his  PCP for monitoring of his PT/INR.   Problem #5:  DIABETES MELLITUS.  The patient's Accu-Cheks were monitored  during his hospital stay, and he was  covered with sliding scale insulin.  He was to continue his preadmission medications upon discharge.   DISCHARGE MEDICATIONS:  The patient instructed to hold Hemocyte until  after capsule endoscopy as per gastroenterology.  Patient to continue  his preadmission medications of clonidine, glipizide, aspirin, Actos,  morphine, fenofibrate, metoprolol, terazosin, Protonix, alprazolam,  quinine sulfate, warfarin, simvastatin, Omnicor, and pentoxifylline.   FOLLOWUP CARE:  1. Dr. [name] as scheduled on August 21.  2. Dr. Foy Guadalajara on Monday for PT/INR.  3. Dr. Donnie Coffin -- patient to call for appointment.      Kela Millin, M.D.  Electronically Signed     ACV/MEDQ  D:  08/22/2007  T:  08/23/2007  Job:  [id number]   cc:   [name] L. Foy Guadalajara, M.D.

## 2011-04-02 NOTE — H&P (Signed)
[place]  Patient:    [name], [name]                          MRN: [id number] Adm. Date:  06/02/01 Attending:  Quita Skye. Hart Rochester, M.D. Dictator:   Marlowe Kays, P.A. CC:         Marinda Elk, M.D.  Candy Sledge, M.D.   History and Physical  DATE OF BIRTH:  07-Oct-1941  CHIEF COMPLAINT:  Left carotid artery disease, symptomatic.  HISTORY OF PRESENT ILLNESS:  Mr. Garrelts is a very pleasant 70 year old white male with known history of carotid artery disease which has not been followed on ultrasounds since 1997.  The patient presented to [facility] for a followup of his PVOD/claudication for which he had undergone femoral to tibial peroneal trunk bypass grafting November 18, 2000 by Dr. Hart Rochester.  At the time of the visit on July 16 he reported a few episodes of amaurosis fugax in the left eye where his vision obliterates with the exception of a very small corner in the inferolateral quadrant.  In fact, during the visit mentioned above, he was experiencing these symptoms.  Immediately, Dopplers were performed which revealed severe flow decreased in the left ICA and very mild decrease in the right side, antegraded blood flow in both vertebral arteries.  Dr. Hart Rochester scheduled the patient to undergo left CEA on June 02, 2001.  Other than his vision changes, the patient is asymptomatic for headache, nausea, vomiting, or vertigo.  No dizziness, falls, seizures, numbness, or tingling.  No muscle weakness, speech impairment, dysphagia, syncope, or presyncope.  No memory loss or confusion.  PAST MEDICAL HISTORY:  Carotid artery disease, severe, PVOD/claudication, chronic Coumadin use, status post MI 1970, hypertension, history of PUD, osteoarthritis, COPD, history of tobacco abuse, gout, peripheral neuropathy, history of pancreatitis, chronic back pain/lumbar disk disease, anxiety/depression, newly diagnosed diabetes mellitus non-insulin-dependent as stated  by his last discharge summary.  PAST SURGICAL HISTORY:  Status post femoral to tibial peroneal trunk bypass graft on November 18, 2000 by Dr. Hart Rochester, multiple revascularization procedures in the left lower extremity, status post back surgery in 1989, status post bilateral cataract surgery June 1996.  MEDICATIONS: 1. Norvasc 5 mg q.d. 2. Glucotrol XL 10 mg q.d. 3. Prilosec 20 mg one p.o. q.a.m., two p.o. q.p.m. 4. Sublingual nitroglycerin p.r.n. 5. Nortriptyline 15 mg q.d. 6. Coumadin 7.5 mg p.o. or as directed.  The patient stopped taking Coumadin    on May 29, 2001. 7. Pravachol 80 mg q.h.s. 8. Xanax 0.5 mg p.r.n. 9. Diovan 300 mg half p.o. q.d.  Of note, these medications need to be verified for the patient forgot his medication list and has stated that these medications have not changed since his discharge.  ALLERGIES:  NKDA.  REVIEW OF SYSTEMS:  See HPI and past medical history for significant positives.  FAMILY HISTORY:  Mother alive and well at 97.  Father deceased of heart disease at 55.  SOCIAL HISTORY:  Married.  Three children.  He is on disability.  He used to be a Management consultant.  He smokes one and a half packs of tobacco a day for the last 40 years.  He denies any alcohol intake.  PHYSICAL EXAMINATION  GENERAL:  Well-developed, well-nourished 70 year old white male in no acute distress.  Alert and oriented x 3.  VITAL SIGNS:  Blood pressure 130/58, pulse 68, respirations 18.  HEENT:  Normocephalic, atraumatic.  PERRLA.  EOMI.  Funduscopic examination within normal limits.  The patient has very small pupils.  Therefore, his funduscopic examination was significantly difficult.  NECK:  Supple.  No JVD.  Bilateral loud bruits.  No lymphadenopathy.  CHEST:  Symmetrical on inspirations.  LUNGS:  Clear to auscultation.  CARDIOVASCULAR:  Regular rate and rhythm.  A 1/6 systolic murmur.  No rubs or gallops.  ABDOMEN:  Soft, nontender.  Bowel sounds x 4.   No masses.  There is the presence of iliac bruits.  GENITOURINARY:  Deferred.  RECTAL:  Deferred.  EXTREMITIES:  No cyanosis, clubbing, or edema.  No ulcerations.  Warm temperature in the right leg.  His temperature is slightly decreased. Peripheral pulses:  Carotid 2+ bilaterally, femoral, popliteal, dorsalis pedis, and posterior tibialis 2+ on the left and on the right 2+ femorals, popliteal, dorsalis, and posterior tibialis absent.  NEUROLOGIC:  Nonfocal.  Gait steady.  DTRs 2+ bilaterally.  Muscle strength 5/5.  ASSESSMENT AND PLAN:  Symptomatic left internal carotid artery stenosis for left carotid endarterectomy by Dr. Hart Rochester.  Dr. Hart Rochester has seen and evaluated this patient prior to the admission and has explained the risks and benefits involving the procedure and the patient has agreed to continue. DD:  06/01/01 TD:  06/01/01 Job: [id number]

## 2011-04-02 NOTE — Op Note (Signed)
[name], [name]                 ACCOUNT NO.:  [id number]   MEDICAL RECORD NO.:  [id number]          PATIENT TYPE:  INP   LOCATION:  3303                         FACILITY:  [facility]   PHYSICIAN:  Quita Skye. Hart Rochester, M.D.  DATE OF BIRTH:  11/15/1941   DATE OF PROCEDURE:  05/11/2006  DATE OF DISCHARGE:                                 OPERATIVE REPORT   PREOPERATIVE DIAGNOSIS:  Severely ischemic left foot with infected left  first toe secondary to severe superficial femoral, popliteal, and tibial  occlusive disease.   POSTOPERATIVE DIAGNOSIS:  Severely ischemic left foot with infected left  first toe secondary to severe superficial femoral, popliteal, and tibial  occlusive disease.   OPERATION:  Left common femoral to posterior tibial (distal at the ankle  level) bypass using a composite non-reversed cephalic vein graft from both  upper extremities with interoperative arteriogram.   SURGEON:  Quita Skye. Hart Rochester, M.D.   FIRST ASSISTANT:  Larina Earthly, M.D.   SECOND ASSISTANT:  Rowe Clack, P.A.-C.  [name], P.A.-C.   ANESTHESIA:  General endotracheal.   PROCEDURE:  The patient was taken to the operating room and placed in the  supine position at which time satisfactory general endotracheal anesthesia  was administered.  The entire left leg and both upper extremities were  prepped with Betadine scrub solution and draped in a routine sterile manner.  Initially, a medial incision was made posterior to the medial malleolus and  the posterior tibial artery was exposed down to the medial malleolus.  It  was a very diffusely diseased vessel but did become slightly better distally  where it was patent on the angiogram.  Both cephalic veins were removed from  the upper extremities through a longitudinal incision beginning in the mid  forearm extending up to the deltopectoral groove in the shoulder.  The  branches were carefully ligated with 5-0 silk ties and divided.  Both veins  were removed and gently dilated heparinized saline and were excellent veins.  They were marked for orientation purposes.  A left inguinal incision was  then made and there was dense scar tissue from previous bypass.  The common  femoral artery was exposed where an occluded femoral-popliteal vein graft  was identified and then control of the external iliac artery above the  inguinal ligament was obtained as well as the profunda and superficial  femoral arteries distally.  The patient was then heparinized.  The femoral  vessels were occluded with vascular clamps.  A longitudinal opening was made  in the common femoral artery and the hood of the previous vein graft, there  was excellent in flow present.  The cephalic veins were used in a non-  reversed fashion and the first vein was anastomosed end-to-side to the  arteriotomy in the common femoral artery with 6-0 Prolene.  Following this,  the clamps released and retrograde valvulotome was used to lyse the valves  and there was excellent flow from the distal end of the vein graft.  The  second vein was then spatulated and also used in a non-reversed  fashion and  the veins were anastomosed end-to-end with 6-0 Prolene and the valves in the  second vein were also lysis with the valvulotome with resultant excellent  flow of the distal end of the vein graft.  One counter incision was made at  the knee level medially.  A subcutaneous tunnel was then created medially  and the vein graft was carefully delivered through the tunnel down to the  ankle level.  Following this, the posterior tibial artery was occluded with  vessel loops and initially a longitudinal opening was made at the very  proximal portion of the medial malleolus and the artery was totally occluded  at this level.  It was dissected down distally further where it did become  softer and a second incision was made in the artery at this point, and the  arteriotomy was extended with Potts  scissors, and it was patent at the  distal end of the arteriotomy accepting a 2 mm dilator was some sluggish  back bleeding.  The vein was carefully measured and spatulated and  anastomosed end-to-side with 6-0 Prolene.  The vessel loops were then  released and there was good Doppler flow in the graft.  Interoperative  arteriogram revealed a widely patent anastomosis into the posterior tibial  artery which filled the foot.  There was retrograde filling of the distal  peroneal artery in the very lower third of the leg.  Protamine was given to  reverse the heparin.  Following adequate hemostasis, the wounds were all  closed in layers with Vicryl and clips.  Sterile dressings were applied.  The patient was taken to the recovery room in satisfactory condition.           ______________________________  Quita Skye Hart Rochester, M.D.     JDL/MEDQ  D:  05/11/2006  T:  05/11/2006  Job:  [id number]

## 2011-04-02 NOTE — H&P (Signed)
NAME:  [name], [name]                           ACCOUNT NO.:  [id number]   MEDICAL RECORD NO.:  [id number]                   PATIENT TYPE:  INP   LOCATION:  3316                                 FACILITY:  [facility]   PHYSICIAN:  Quita Skye. Hart Rochester, M.D.               DATE OF BIRTH:  10/27/1941   DATE OF ADMISSION:  12/24/2002  DATE OF DISCHARGE:                                HISTORY & PHYSICAL   OPERATIVE DIAGNOSIS:  Ischemic left foot, secondary to occlusion of left  femoral tibial peroneal trunk bypass with severe femoral popliteal and  tibial occlusive disease.   POSTOPERATIVE DIAGNOSIS:  Ischemic left foot, secondary to occlusion of left  femoral tibial peroneal trunk bypass with severe femoral popliteal; and  tibial occlusive disease.   OPERATION:  Re-do left femoral to peroneal bypass using a non-reversed  translocated saphenous vein graft from the contralateral right leg with  intraoperative arteriogram.   SURGEON:  Dr. Hart Rochester.   FIRST ASSISTANT:  Dr. Cari Caraway.   SECOND ASSISTANT:  Loura Pardon.   ANESTHESIA:  General endotracheal.   PROCEDURE:  The patient was taken to the operating room and placed in the  supine position at which time satisfactory general endotracheal anesthesia  was administered.  Both legs were prepped with Betadine scrub and solution,  draped in routine sterile manner.  The saphenous vein was removed from  contralateral right leg, from the saphenofemoral junction to the distal calf  through multiple small incisions on the medial aspect of the leg with  branches ligated with 4 and 5-0 silk ties and divided.  It was removed and  gently dilated with heparinized saline, marked for orientation purposes.  Short incision was made through the previous wound in the left groin and the  common femoral artery was exposed at the inguinal ligament as was the  profunda femoris artery which was heavily calcified and diseased.  There  were two old thrombosed  femoral popliteal Gore-Tex grafts arising from the  common femoral artery which were dissected free.  The previous medial  incision had been made below the knee and this was extended distally.  Peroneal artery was identified about 6-8 cm distal to its origin, dissected  free among the tibial veins.  It was a thickened vessel but was patent on  the angiogram.  A subcutaneous tunnel was made medially and the patient was  then heparinized.  The femoral vessels were occluded with vascular clamps,  the previous Gore-Tex graft completely excised from the common femoral  artery.  The superficial femoral artery had previously been resected because  of complete thrombosis and this necessitated transecting the very proximal  portion of the superficial femoral artery and spatulating it up to the  origin of the profunda and using the saphenous vein graft end-to-end after  spatulating the vein graft.  The vein was used in a non-reversed fashion and  this  anastomosis was done with 6-0 Prolene.  Clamps were released and there  was a good pulse down to the first set of competent valves.  Using  retrograde valvulotome, the valve was rendered incompetent with adequate  flow out of the distal end of the vein graft.  There were a few small areas  where there was some calcification in the vein and the distal end of the  vein was about 2-2.5 mm in size.  After delivery through the subcutaneous  tunnel, the peroneal artery was opened with a 15 blade and extended with the  Potts scissors, it would accept a 2.5 mm dilator distally and was very  diseased proximally.  The vein was carefully measured and spatulated,  anastomosed end-to-side with 6-0 Prolene for the heel and 7-0 Prolene for  toe. Following completion of this, vessel loops were released and there was  an excellent pulse in the vein graft.  There was good Doppler flow in the  peroneal and posterior tibial arteries at the ankle.  Intraoperative   arteriogram revealed a patent anastomosis into the peroneal artery with a  lot of spasm in the vessel distally and also spasm in the posterior tibial  artery which was filling retrograde.  60 mg of propafenone was then  administered intra-arterially into the vein graft to hopefully relieve some  of this distal spasm.  No protamine was given.  The wound was irrigated with  saline, closed in layers with Vicryl and subcuticular fascia with clips.  Sterile dressing applied and patient taken to the recovery room in  satisfactory condition.                                               Quita Skye Hart Rochester, M.D.    JDL/MEDQ  D:  12/27/2002  T:  12/27/2002  Job:  [id number]

## 2011-04-02 NOTE — H&P (Signed)
[name], [name]                 ACCOUNT NO.:  [id number]   MEDICAL RECORD NO.:  [id number]           PATIENT TYPE:   LOCATION:                                 FACILITY:   PHYSICIAN:  Quita Skye. Hart Rochester, M.D.  DATE OF BIRTH:  1941/06/07   DATE OF ADMISSION:  DATE OF DISCHARGE:                                HISTORY & PHYSICAL   CHIEF COMPLAINT:  Pain left great toe.   HISTORY AND PHYSICAL AND HOSPITAL COURSE:  Patient is a 70 year old  Caucasian male who is well known to [facility].  Patient has history of peripheral  vascular disease which his initial procedure done in June of 1999 he  underwent left femoral to below-knee popliteal bypass grafting using a non-  reversed translocated saphenous vein graft.  Patient has had multiple  problems with his left peripheral vascular occlusive disease and has  required several revascularization attempts.  Please see below past surgical  history.  Patient recently underwent redo left carotid endarterectomy with  Dacron patch angioplasty on Apr 04, 2006.  Patient tolerated this procedure  well and was discharged home the following day.  Patient states that at  diagnosis of the recurrent left internal carotid artery stenosis he had  started to complain of pain in his left great toe.  He thought that this was  due to gout and would resolve with medication.  The patient's left great toe  pain persisted.  He was seen by Dr. Hart Rochester April 26, 2006 for follow-up of  his redo left carotid endarterectomy.  At that time the left carotid  endarterectomy was healing well.  He had complained to Dr. Hart Rochester of left  great toe pain.  At that time Dr. Hart Rochester noted left foot to be pale with  some inflammation around the first nail.  Duplex scan of his left femoral  peroneal graft was done which showed the graft to be occluded and had  probably been occluded for a few weeks.  ABIs were obtained which showed the  left to have dropped to less than 20% compared to 80% in  January 07, 2006.  In February the graft was seen to be patent.  Patient states that he  recently saw his internal medicine doctor who felt that the left great toe  had developed a superficial wound infection.  His internal medicine doctor  had started him on cephalexin p.o. 500 mg.  Patient states he was told to  continue taking this until his surgery date.  Patient complains of pain  being severe with pain medications helping minimal.  He states pain radiates  up his left foot.  Noted purulent drainage at the left great toe nail bed.  Noted decrease in temperature left foot with erythema.   PAST MEDICAL HISTORY:  1.  Peripheral vascular disease.  2.  History of left internal carotid artery stenosis with recurrent severe      internal carotid artery stenosis.  3.  Hypertension.  4.  Hyperlipidemia.  5.  Gastroesophageal reflux disease.  6.  Anxiety.  7.  Diabetes mellitus type 2.  8.  History of gout.   PAST SURGICAL HISTORY:  1.  Status post left carotid endarterectomy in 2002.  2.  Status post redo left carotid endarterectomy in May 2007.  3.  Status post left femoral to below-knee popliteal artery bypass with a      non-reversed translocated saphenous vein graft in May 03, 1998.  4.  Status post Dacron patch angioplasty of long vein graft graft stenosis      of the left femoral to popliteal bypass graft June of 2000.  5.  Status post left femoral to below-knee popliteal artery bypass graft      with a 6 mm PTSE graft in October of 2000.  6.  Status post thrombectomy of left femoral to popliteal Gore-Tex bypass      graft below the knee with revision of distal aspect of the left femoral      to popliteal bypass graft with extension to a more distal site in the      popliteal artery using a 6 mm Gore-Tex interposition graft with      exploration of distal anastomosis with removal of embolic debris.  This      was December of 2001.  7.  Status post redo left common femoral to  tibioperoneal trunk bypass with      tibioperoneal trunk endarterectomy using a 6 mm Gore-Tex graft and      thrombectomy of left posterior tibial and peroneal arteries.  January of      2002.  8.  Status post redo left femoral to peroneal bypass using a non-reversed      translocated saphenous vein graft from a contralateral right leg.  This      was February 2004.  9.  Status post back surgery.   ALLERGIES:  Allergic to LOPRESSOR.   MEDICATIONS:  1.  Cephalexin 500 mg b.i.d..  2.  Omega 3 fish oil.  3.  Aspirin 81 mg daily.  4.  Hydrocodone 500 mg three to four times per day.  5.  Metoprolol tartrate 25 mg a half a tablet b.i.d.  6.  Terazosin 10 mg daily.  7.  Felodipine 5 mg daily.  8.  Irbesartan 30 mg daily.  9.  Protonix 40 mg daily.  10. Xanax 0.5 mg t.i.d.  11. Quinine sulfate 260 mg daily.  12. Folic acid 1 mg daily.  13. Pravachol 80 mg daily.  14. Actos 30 mg daily.  15. Pletal 50 mg t.i.d.  16. Tricor 160 mg daily.  17. Reserpine 0.1 mg daily.  18. Glipizide 5 mg daily.  19. Coumadin 5 mg daily which his last dose was April 26, 2006.  Patient      currently not taking Coumadin.   SOCIAL HISTORY:  Patient currently married with three children, lives at  home with his family.  History of tobacco abuse which he has cut back to  half a pack per day.  History of a pack or more over the last 50 years.  Patient currently is on disability.  [place]   FAMILY HISTORY:  Positive for coronary artery disease in father.   REVIEW OF SYSTEMS:  See HPI for pertinent positives and negatives.  Patient  denies any recent fevers, night sweats, chills.  He denies any recent  changes in vision, hearing difficulty, swallowing.  He denies any chest  pain, palpitations, lightheadedness, dizziness, orthopnea, paroxysmal  nocturnal dyspnea, shortness of breath, hemoptysis, or wheezing.  Denies any nausea, vomiting,  abdominal pain, changes in bowel movements,  diarrhea,  constipation, melena, hematemesis, hematochezia.  Denies any urgency,  frequency, dysuria, hematuria.  Denies any muscle weakness.  Denies any TIA  or CVA symptoms.   PATIENT'S HISTORY AND PHYSICAL AND HOSPITAL COURSE:  GENERAL:  Well-  developed, well-nourished white male in no acute distress.  VITAL SIGNS:  Blood pressure 142/58, pulse 78, respirations of 18.  HEENT:  Normocephalic, atraumatic.  Pupils equal, round, reactive to light  accommodation.  Oral mucosa is pink and moist.  NECK:  Well-healed left carotid endarterectomy incision.  Patient does have  bilateral bruits noted left greater than right.  RESPIRATORY:  Clear to auscultation bilaterally.  CARDIAC:  Regular rate and rhythm.  S1 and S2 noted.  No murmurs, gallops,  or rubs noted.  ABDOMEN:  Bowel sounds x4.  Soft, nontender on palpation.  No  hepatosplenomegaly noted.  GENITOURINARY:  Deferred.  RECTAL:  Deferred.  EXTREMITIES:  No edema, cyanosis, or clubbing noted.  Patient's left foot is  noted to be cold to touch.  His left great toe is erythematous.  He does  have purulent drainage noted at the left great toe nail bed.  No palpable  left femoral, dorsalis pedis, or posterior pulses noted.  Bilateral radial  artery noted to be 2+ bilateral pulses noted.  His right femoral is 2+ with  dorsalis pedis and posterior tibial on the right to be 1+.  NEUROLOGIC:  Cranial nerves II-XII intact.  Patient is alert and oriented  x3.  Muscle strength 5/5 upper and lower extremities bilaterally.   STUDIES:  Patient did undergo an angiogram done on May 02, 2006 by Dr.  Edilia Bo.  Results are currently not available.   IMPRESSION AND PLAN:  Patient is seen with ischemic left foot secondary to  occluded left femoral to peroneal bypass grafting.  Patient was seen and  evaluated by Dr. Hart Rochester.  Dr. Hart Rochester discussed with patient undergoing redo  of left femoral to peroneal graft using bilateral arm vein with possible   lesser saphenous vein.  He discussed risks and benefits of this procedure  with patient and family.  Patient acknowledges understanding and agreed to  proceed.  Surgery is scheduled for May 11, 2006.  Patient does have a  follow-up appointment with Dr. Hart Rochester on May 10, 2006.  Patient is to  contact the office if he has any further questions or increase in severity  of his pain in the left foot.  Patient acknowledges understanding.      Theda Belfast, PA    ______________________________  Quita Skye Hart Rochester, M.D.    KMD/MEDQ  D:  05/06/2006  T:  05/06/2006  Job:  [id number]

## 2011-04-02 NOTE — Discharge Summary (Signed)
   NAME:  [name], [name]                           ACCOUNT NO.:  [id number]   MEDICAL RECORD NO.:  [id number]                   PATIENT TYPE:  INP   LOCATION:  2010                                 FACILITY:  [facility]   PHYSICIAN:  [name]. Earlene Plater, N.P.              DATE OF BIRTH:  1941-11-03   DATE OF ADMISSION:  12/12/2002  DATE OF DISCHARGE:  12/20/2002                                 DISCHARGE SUMMARY   NO DICTATION.  PHYSICIAN SAID YOU CAN DELETE THIS DICTATION AS HE IS NOT  OURS.                                               [name]. Earlene Plater, N.P.    SMD/MEDQ  D:  12/24/2002  T:  12/24/2002  Job:  [id number]

## 2011-04-02 NOTE — H&P (Signed)
. [facility]  Patient:    [name], [name]                        MRN: [id number] Adm. Date:  [id number] Disc. Date: [id number] Attending:  Bennye Alm CC:         Teena Irani. Arlyce Dice, M.D.   History and Physical  CHIEF COMPLAINT:  Cold left foot.  HISTORY OF PRESENT ILLNESS:  This is a pleasant 70 year old gentleman who has undergone multiple previous revascularizations of the left lower extremity. Initially in June of 1999 he underwent a left femoral to below knee popliteal artery bypass with a nonreversed translocated saphenuos vein graft and intraoperative arteriogram.  Subsequently in June of 1999, he developed a vein graft stenosis of his left fem/pop graft with diffuse degeneration of the graft.  He therefore underwent a Dacron patch angioplasty of a long segment stenosis of his fem/pop graft on April 16, 1999.  Of note, this patient has terrible disease of his veins with calcific disease in both saphenous veins. He subsequently occluded this graft and on August 23, 1999, underwent a new left femoral to below knee popliteal artery bypass graft with a 6 mm PTFE graft.  Most recently, this graft occluded in November of this year and he was admitted on September 20, 2000, for thrombolysis of his graft.  The graft was successfully opened for thrombolysis and no significant problems were identified with the graft.  He was then placed on Coumadin postoperatively and was discharged to home on September 27, 2000.  He now presents with new onset of coolness of his left foot which began yesterday at 3 p.m.  He was brought in and seen by the vascular lab here where it was shown that his graft was occluded and he had an ABI of 36% on the left and 60% on the right.  It was felt that he had an occluded graft and he is therefore being admitted for possible thrombectomy of his graft.  Of note, he has been on his Coumadin.  PAST MEDICAL HISTORY:   Significant for peripheral vascular disease.  Coronary artery disease and he had a myocardial infarction in 1970.  He has hypertension.  Peptic ulcer disease.  COPD with ongoing tobacco abuse. Niddmx.  Gout.  History of peripheral neuropathy.  History of pancreatitis.  PAST SURGICAL HISTORY:  As described above.  In addition, he has undergone back surgery in 1989 and had bilateral cataract implants in June of 1996.  MEDICATIONS: 1. Coumadin 7.5 mg alternating with 5 mg each day. 2. Prilosec 20 mg in the morning, 40 mg in the p.m. 3. Nortriptyline 15 mg p.o. q.h.s. 4. Cardura 8 mg p.o. q.d. 5. Xanax 0.5 mg p.o. t.i.d. 6. Norvasc 5 mg p.o. q.d. 7. Glucotrol 10 mg p.o. q.d. 8. Sublingual nitroglycerin p.r.n.  ALLERGIES:  No known drug allergies.  REVIEW OF SYSTEMS:  He has had no recent chest pain or shortness of breath. He does have a history of impotence.  He also has a history of lumbar disk disease.  Review of systems is otherwise negative.  FAMILY HISTORY:  His father died with coronary artery disease.  SOCIAL HISTORY:  He is married and has three children.  He smokes 1-1/2 packs of cigarettes per day and has been smoking for over 40 years.  PHYSICAL EXAMINATION:  VITAL SIGNS:  Blood pressure 150/68, heart rate 100.  He has bilateral carotid bruits.  LUNGS:  Clear bilaterally to auscultation.  HEART:  He has a regular rate and rhythm.  ABDOMEN:  Soft and nontender.  He has palpable femoral pulses with no distal pulses.  Motor and sensory function is intact in the left foot.  ABI on the left is 36% with monophasic Doppler signals in the left foot and monophasic Doppler signals in the right foot.  He has no significant lower extremity swelling.  SKIN:  Shows no significant ulcerations.  The foot is not mottled, although, it is slightly cooler on the left than the right.  IMPRESSION:  This patient has an occluded left fem/pop bypass graft despite Coumadin.  PLAN:  To  admit him to the hospital and check his pro-time.  If the Coumadin is subtherapeutic, we will take him to the operating room for thrombectomy. If the Coumadin is therapeutic, we will place him on heparin and let the Coumadin wear off and plan thrombectomy of his graft when the pro-time is returned toward normal.  Given the fact that this graft has occluded multiple times, he understands that the chances of keeping this open are not ideal.  In addition, we discussed the option of doing nothing given the fact that he does have reasonable circulation to the left foot with an ABI of 36% and no significant rest pain.  However, I felt that it was worth trying to thrombectomize the graft as this just happened and our chances of getting it open will be much greater if we attempt thrombectomy as soon as possible.  The patient is being admitted urgently to [facility] and further recommendations will be made pending his pro-time results. DD:  11/04/00 TD:  11/04/00 Job: [id number]

## 2011-04-02 NOTE — Op Note (Signed)
[place]. [facility]  Patient:    [name], [name] Visit Number: [id number] MRN: [id number]          Service Type: DSU Location: [facility] 2870 01 Attending Physician:  Ruta Hinds Dictated by:   Pearletha Furl Alanda Amass, M.D. Proc. Date: 10/06/01 Admit Date:  10/06/2001 Discharge Date: 10/06/2001   CC:         Di Kindle. Edilia Bo, M.D.  Marinda Elk, M.D.  CP Lab   Operative Report  PROCEDURE:  Retrograde abdominal aortic catheterization, bilateral lower extremity runoff using bolus Chase technique with VSA imaging and automatic step table, bilateral iliac angiography, right iliac angiography and injections multiple projections.  DESCRIPTION OF PROCEDURE:  The patient was brought to the sixth floor PV lab in a postabsorptive state.  After 5 mg of ______  p.o. premedication he was hydrated preoperatively, and creatinine was 1.2.  The right groin was prepped and draped in usual manner.  Xylocaine 1% was used for local anesthesia.  The RFA was entered with a single anterior puncture using an 18 thin wall needle. Dilatation of the arterial tract because of prior procedures and scar tissue was required with a 6 [place] dilator and Amplantz super stiff wire for support, and a 5 [place] side arm sheath was then inserted without difficulty. French 5 pigtail catheter was placed above the level of the renal arteries, and abdominal aortic angiogram was done in the mid stream PA projection at 20 cc, 20 cc per second.  A second injection was done at 20 cc, 20 cc per second above the iliac bifurcation.  Bilateral lower extremity run off was done with VSA imaging at 80 cc, 8 cc per second with visualization to the feet bilaterally.  Hand injections of the right iliac system were done in PA and oblique projections through the side arm sheath.  Arterial pressures were monitored throughout the procedure, and ranged 185 to 200.  The patient was given 10 mg of  labetalol IV twice, blood pressure came down to 160 with maintenance of sinus rhythm and no clinical problems, particularly pulmonary. Side arm sheath was flushed.  The patient was brought to the holding area for sheath removal and pressure hemostasis.  He tolerated the procedure well.  Abdominal aortic angiogram showed widely patent SMA and celiac access, although because of catheter placement they were only seen proximally.  The left renal artery was single and normal.  The right renal artery was single.  There was calcification at the ostium proximally, but less than 30% ostial and proximal narrowing.  The posterior division arose about 1 cm into the proximal right renal artery.  This area showed progression of disease in approximately 85% of the ostia of the posterior division of the right renal artery with good flow on flush injection.  It came off at a "right angle" to the main renal artery.  It appeared moderately smaller then the anterior division.  The infrarenal abdominal aorta showed eccentric atherosclerosis with no significant stenosis or aneurysm formation.  Just above the iliac bifurcation there was a right eccentric plaque with about 30 to 40% narrowing of the distal abdominal aorta before the right iliac, but there was no pressure gradient across this.  The LCIA had 30% narrowing that was calcific in an area of prior stenosis, and was improved from prior angiography.  The right common iliac had 40 to 50% narrowing eccentrically before the right hypogastric.  The left hypogastric was intact, but had 95% proximal and  ostial stenosis.  The right hypogastric was intact and had 95% mid stenosis.  The REIA had multiple bead-like tandem triple tandem 50 to 60% lesions.  On oblique views there did not appear to be high grade stenosis, and there was no pressure gradient across this.  There was 40% eccentric stenosis to the RCFA. The LCIA had diffuse irregularities, but no  high grade stenosis.  The left SFA was totally occluded.  The left profunda was intact.  The LFPBG was widely patent with an excellent anastomosis to the LCFA.  There was good flow throughout this graft with an excellent anastomosis to the left tibial perineal (TP trunk).  There was faint flow to the proximal LAT which was then occluded, faint flow to the left posterior tibia was occluded, but there was good flow through the perineal which was visualized to the left foot.  There were profunda collaterals as well on the left, and distal collateralization near the foot.  The right SFA had 50% narrowing at the junction of the proximal mid, 50% ______ canal with diffuse disease throughout, but no high grade stenosis. The right popliteal had a proximal 85% irregular lesion above the knee, and there was another 85 to 90% irregular lesion of the popliteal just below the knee.  There appeared to be a 90 to 95% stenosis at the proximal right TP trunk.  The RAT was well visualized and coursed to the foot and was the dominant artery.  There was three vessel run off to the right foot with diffuse disease of the perineal and RPT.  DISCUSSION:  Please refer to H&P.  Essentially, this 70 year old married father of three with five grandchildren continues to smoke a pack a day, has adult onset diabetes mellitus, known PAD, chronic obstructive pulmonary disease, past history of gout, hyperlipidemia, and systemic hypertension.  He has had prior LFPBG by Dr. Edilia Bo, and has had prior redo operations for revisions.  His last surgery was 12/01, when he had thrombectomy and revision of the LFPBG which continues to be patent on this study.  His LCIA narrowing is less than on prior studies, and his aortic narrowing is unchanged.  His right external iliac tandem stenosis is unchanged from prior studies, and  does not appear to be hemodynamically significant angiographically or by gradient.  There were tandem  proximal and distal popliteal lesions on the right which have progressed, and disease in the RTP trunk with symptoms of right calf claudication that are stable.  He has progression of disease in the posterior division of the right renal artery which is poorly suitable to intervention since it has a very inferior take off and rises from the proximal third of the main right renal artery. The anterior division has no significant stenosis, the left renal artery has no significant stenosis.  It is possible that an exacerbation of his hypertension may be related to the posterior division stenosis.  I suspect that this may progress and may close spontaneously.  It is poorly suited to intervention, and I would recommend continued medical therapy of his exacerbation of systemic hypertension.  Risk factor modification obviously is necessary in this patient, dietary as well as discontinuation of smoking, along with continued medical therapy of his multiple medical problems.  The case will be discussed with his primary physician, Dr. Foy Guadalajara, and his peripheral vascular physician Dr. Edilia Bo.  CATHETERIZATION DIAGNOSES:  1. Systemic hypertension, recent exacerbation.  2. Progression of disease with 80% stenosis posterior division single right  renal artery, normal anterior division; normal left renal artery, past     possible renovascular component unfavorable for intervention.  3. Abdominal aortic atherosclerotic disease moderate, unchanged.  4. Moderate right external iliac disease, nonobstructive, unchanged.  5. Patent LFPBG with one vessel run off LLE.  6. Diffuse SFA disease, RLE, with tandem 85% and 95% popliteal in TP trunk,     RLE.  7. Hyperlipidemia.  8. Hypertriglyceridemia.  9. Adult onset diabetes mellitus. 10. History of gout. 11. Gastroesophageal reflux disease. 12. Chronic back pain. 13. Status post LCA 06/02/01, after an episode of amaurosis, Dr. Hart Rochester. 14. Remote normal  coronaries in 1970s, followup Cardiolite pending. Dictated by:   Pearletha Furl Alanda Amass, M.D. Attending Physician:  Ruta Hinds DD:  10/06/01 TD:  10/08/01 Job: [id number]

## 2011-04-02 NOTE — Op Note (Signed)
[place]. [facility]  Patient:    [name], [name]                          MRN: [id number] Proc. Date: 06/02/01 Attending:  Quita Skye. Hart Rochester, M.D.                           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  OPERATIONS:  Left carotid endarterectomy with Ti-Cron patch angioplasty.  SURGEON:  Quita Skye. Hart Rochester, M.D.  FIRST ASSISTANT:  Loura Pardon, P.A.  ANESTHESIA:  General endotracheal.  BRIEF HISTORY:  This patient recently had left lower extremity surgery to revascularize an ischemic left leg and was scheduled to return to have his carotid artery checked because of a bruit and previous study revealing some moderate stenosis several years ago.  In the interim he developed loss of vision in the left eye except for the inferolateral quadrant and was felt to have suffered a retinal embolus.  This returned somewhat but not to total normal vision.  Carotid duplex scanning revealed progression of disease on the left side to a severe state, severe left internal carotid stenosis.  He was scheduled for left carotid endarterectomy.  PROCEDURE:  The patient was taken to the operating room, placed in the supine position, at which time satisfactory general endotracheal anesthesia was administered.  The left neck was prepped with Betadine scrub and solution, draped in a routine sterile manner.  Incision was made along the anterior border at the sternocleidomastoid muscle, carried down to the subcutaneous tissue and platysma using a Bovie.  Common fascial vein and external jugular vein was divided, exposing the common, internal and external carotid arteries. Care was taken not to enter the vagus or hypoglossal nerves, both of which were exposed.  There was a calcified atherosclerotic plaque.  After  carotid bifurcation extending up the internal carotid artery about 2 cm, distal vessel appeared normal.  A #10 shunt was prepared and the patient was heparinized. The carotid vessels were occluded with vascular clamps, longitudinal opening made in the common carotid with a 15 blade, extended up the internal carotid with a Potts scissors to a point distal to the disease.  A #10 shunt was inserted without difficulty, reestablishing flow in about two minutes.  The plaque was very soft and friable with about 80% stenosis and a large ulceration posteriorly.  Standard endarterectomy was then performed, using elevator and Potts scissors with the eversion endarterectomy at the external carotid.  The plaque feathered off the distal internal carotid artery nicely, not requiring any tacking sutures.  The lumen was thoroughly irrigated with heparin and saline, and all loose debris carefully removed and the arterototomy was closed with a patch using continuous 6-0 Prolene.  Prior to completion of closure, the shunt was removed, following antegrade and retrograde flushing.  The closure was completed with reestablishment of flow initially up the external and up the internal branch.  Carotid was occluded for less than two minutes for removal of the shunt.  Protamine was then given to reverse the heparin.  Following adequate hemostasis, the wound was irrigated with saline and closed in layers of Vicryl in a subcuticular fashion.  A sterile dressing was applied and the patient was taken to the recovery room in satisfactory condition. DD:  06/02/01 TD:  06/02/01 Job: [id number]

## 2011-04-02 NOTE — Op Note (Signed)
[name], [name]                 ACCOUNT NO.:  [id number]   MEDICAL RECORD NO.:  [id number]          PATIENT TYPE:  AMB   LOCATION:  SDS                          FACILITY:  [facility]   PHYSICIAN:  Di Kindle. Edilia Bo, M.D.DATE OF BIRTH:  [birth year]   DATE OF PROCEDURE:  05/02/2006  DATE OF DISCHARGE:  05/02/2006                                 OPERATIVE REPORT   PREOPERATIVE DIAGNOSIS:  Infrainguinal arterial occlusive disease.   POSTOPERATIVE DIAGNOSIS:  Infrainguinal arterial occlusive disease.   PROCEDURE:  1.  Right iliac arteriogram.  2.  Selective catheterization of the left common iliac artery.  3.  Left iliac arteriogram.  4.  Selective catheterization of left external iliac artery.  5.  Left lower extremity arteriogram.   SURGEON:  Dr. Edilia Bo.   ASSISTANT:  Nurse.   ANESTHESIA:  Is local with sedation.   TECHNIQUE:  The patient was taken to the PV lab at [facility] and sedated with a  milligram of Versed and 50 mcg of fentanyl.  Both groins were prepped and  draped in usual sterile fashion.  This patient had a previous stem peroneal  bypass graft on the left which was occluded.  I elected to stick the right  groin to stay away from the proximal anastomosis of the graft on the left.  After the skin was anesthetized with 1% lidocaine the right common femoral  artery was cannulated and with some difficulty I was able to get the wire  through the iliac system on the right.  I had to use an angled Glidewire.  I  then advanced the sheath over the wire and a right iliac arteriogram was  obtained through the right femoral sheath.  Next the IMA catheter was  advanced over the wire and then positioned in the proximal left common iliac  artery.  A selective left common iliac arteriogram was obtained.  Next the  angled Glidewire was advanced down into the common femoral artery on the  left and then the IMA catheter was exchanged for an end-hole catheter which  was positioned in the  left external iliac artery distally.  Left lower  extremity runoff films obtained through the end-hole catheter.   FINDINGS:  There is moderate diffuse disease throughout the common iliac and  external iliac artery on the right with no focal stenosis identified.  Hypogastric artery on the right is occluded.  On the left side there is  moderate disease of the common iliac artery but there is no tight focal  stenosis.  The hypogastric artery on the left is patent although there is a  proximal 80% stenosis.  There is mild to moderate disease throughout the  external iliac artery on the left but again no focal stenosis.  The proximal  common femoral artery does have some moderate diffuse disease.  The deep  femoral artery is patent on the left.  The superficial femoral artery is  occluded at its origin and the bypass graft is occluded at its origin.  The  superficial femoral artery and popliteal artery and proximal tibial vessels  are all  completely occluded.  There is reconstitution of the proximal  anterior tibial artery which is patent proximally and then becomes severely  diseased in the distal third of the leg where there is then occluded. The  distal anterior tibial artery and dorsalis pedis artery is occluded.  There  is collateral off the anterior tibial which then reconstitutes the distal  peroneal.  The peroneal then reconstitutes the distal posterior tibial  artery at the level of the ankle.   CONCLUSIONS:  1.  Moderate iliac artery occlusive disease bilaterally.  2.  Occluded fem peroneal bypass graft on the left.  3.  Occluded superficial femoral artery and popliteal artery with severe      tibial occlusive disease as described above.      Di Kindle. Edilia Bo, M.D.  Electronically Signed     CSD/MEDQ  D:  05/02/2006  T:  05/02/2006  Job:  [id number]

## 2011-04-02 NOTE — H&P (Signed)
NAME:  [name], [name]                           ACCOUNT NO.:  [id number]   MEDICAL RECORD NO.:  [id number]                   PATIENT TYPE:  INP   LOCATION:  3732                                 FACILITY:  [facility]   PHYSICIAN:  Robert [name]. Foy Guadalajara, M.D.               DATE OF BIRTH:  1940-11-26   DATE OF ADMISSION:  12/07/2002  DATE OF DISCHARGE:                                HISTORY & PHYSICAL   PROBLEMS/ACUTE:  1. Anemia critical hemoglobin level of 6.1, symptomatic blood pressure and     heart rate changes suspected GI tract losses duration several weeks.  See     NSAID usage due to chronic pain management of spinal stenosis.  2. Anticoagulation therapy for vascular disease.   CHIEF COMPLAINT:  I feel weak and dizzy.   HISTORY OF PRESENT ILLNESS:  A 70 year old male smoker with chronic medical  problems notably chronic anticoagulation due to severe peripheral and  carotid vascular disease and chronic NSAID analgesia due to spinal  radiculopathy and spinal stenosis.  He has been progressively declining over  the past 1 to 1-1/2 weeks which he had attributed to the use of narcotic-  containing analgesic.  Mainly feeling increasingly weak, fatiguable, today  it reached a peak and he became dizzy and his wife insisted he see his  doctor because of his pale skin.  In the office today a hemoglobin of 6.9  was documented and he was then sent for hospitalization.  Unfortunately we  do not have a recent baseline hemoglobin or an entire outpatient record.   PAST MEDICAL HISTORY:  1. Tobacco abuse ongoing, 1 pack daily or more.  2. CAD.  3. ASCVD.  4. Carotid disease.  5. History of carotid endarterectomy left.  6. Aortic femoral popliteal bypass grafting left lower extremity.  7. Anticoagulation.  8. HTN.  9. Dyslipidemia and hypertriglyceridemia.  10.      Diabetes type 2 diagnosed in 1990.  11.      Last glycated hemoglobin 6.2.  12.      Chronic low back pain with right lower  extremity radiculopathy and     recent evidence of spinal stenosis L3-4 due to spurring and an L5     radiculopathy with a slight stenosis.  13.      History of alcoholism, abstinence since 1993 (approximately) with     history of acute GI bleeding requiring hospitalization and acute     pancreatitis.  14.      Erectile dysfunction.  15.      Prostatic hypertrophy.   MEDICATIONS:  1. Felodipine 5 mg a day.  2. Cardura 2 mg q.d.  3. Diovan 320 mg q.h.s.  4. Zocor 40 mg q.d.  5. Tri-Chlor 160 mg q.d.  6. Coumadin 7.5 mg Wednesday and Sunday and on all other days takes 1/2 of     7.5 mg tablet.  7.  Aciphex 20 mg q.d.  8. Atenolol 25 mg q.d.  9. Glipizide 10 mg q.d.  10.      Actos 30 mg q.d.  11.      Pletal 50 mg q.d.   ALLERGIES:  NO KNOWN DRUG ALLERGIES BUT LOPRESSOR REPORTEDLY TRIGGERED  HYPOTENSION.   SOCIAL HISTORY:  Has a disabling condition.  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  As noted, he smokes and denies ETOH use or drugs.   FAMILY HISTORY:  Father with diabetes.  Father died of cardiac and  peripheral vascular disease.  His 28 year old mother is alive, he visits her  quite frequently though has been unable to do so in the past few weeks.   REVIEW OF SYSTEMS:  GI:  No abdominal pain.  He did have a solitary bout of  emesis on Tuesday or Wednesday following breakfast.  Denies coffee-grounds  emesis.  Also denies epistaxis, bleeding gums, or unusual bruising.  Denies  chest pain, headache, tinnitus, dizziness per HPI, as well as fatigue but no  SOB.  Chronically has diminished urinary flow, some nocturia.  Denies fever.  Preventive flu and Pneumovax UTD.  Diabetic eye exam in the past 2 years.   PHYSICAL EXAMINATION:  GENERAL:  Alert and oriented, pleasant gentleman with  marked pallor.  No overt signs of ecchymoses or external bleeding.  VITAL SIGNS:  BP is 140/70 supine with pulse of 98 sitting, BP 110/50, pulse  of 110, respirations 18, O2 98% on room  air.  LUNGS:  CTA bilaterally.  CARDIAC:  RRR, no murmur, rub, gallop.  ABDOMEN:  Soft, doughy.  Bowels sounds normal.  No tenderness.  BACK:  Tenderness of the right and left OS and spinal areas with pain into  his right glut and quad and a positive straight leg raise on the right at  about 20 degrees.  There is no ankle edema.  There is a large scar coursing  the length of the left lower extremity across the left inguinal region, well  healed some time ago.  RECTAL:  Tone is normal.  He has external skin tags.  Prostate is generous  size, nontender, no nodules.  His stool is grossly black and formed feces.   LABORATORY:  Hemoglobin 6.1, INR 5, stool guaiac positive, others pending.   ASSESS:  Critical anemia due to blood loss from GI tract complicated by his  over-anticoagulation and perhaps an antecedent solitary episode of emesis 2-  3 days ago in the absence of ongoing emesis and certainly no acute  hematemesis, would suspect lower tract GI blood losses.   PLAN:  Presently he is hemodynamically stable but he needs to be admitted.  He had apparently  was directly transferred up to unit 5100 rather than  having his care initiated in the ER.  Unfortunately, there are no monitored  beds offered on 5100 and he will need to be transferred to a monitored  setting which is in the works.  He has been typed and crossed for at least 2  units of __________ to begin transfusing ASAP, 2 IV sites placed.  He is  given IV fluids, normal saline, started on an IV proton pump inhibitor.  I  have ordered vitamin K IM and will recheck his INR in 12 hours.  However,  should he destabilize hemodynamically acutely, he would need prompt reversal  with FFP.  I have consulted GI.  Dr. Denyse Amass is covering for Dr. Edythe Clarity who  will see  this patient for [place].  In the long range, issues of anticoagulation  will have to be approached delicately as well as issues of chronic pain management, avoiding NSAIDS.  It  appears he is taking Lorcet which Dr. Foy Guadalajara  has been giving him but he also mentioned some medications he gets from the  [facility].  Finally, his diabetes needs to be watched closely and the  issues of future anticoagulation discerned as well.  Dr. Trula Slade,  Hospital List will follow from here, [phone].                Lilyan Punt Sydnee Levans, M.D.                   Robert [name]. Foy Guadalajara, M.D.    KCS/MEDQ  D:  12/07/2002  T:  12/08/2002  Job:  [id number]   cc:   [name] [name]. Foy Guadalajara, M.D.  [address] [place]  [place] [address]  Fax: [phone]   Wilhemina Bonito. Eda Keys., M.D. [facility]  [address]  [place]  [place] [address]  Fax: [phone]

## 2011-04-02 NOTE — Discharge Summary (Signed)
[name], [name]                 ACCOUNT NO.:  [id number]   MEDICAL RECORD NO.:  [id number]          PATIENT TYPE:  OUT   LOCATION:  VASC                         FACILITY:  [facility]   PHYSICIAN:  Quita Skye. Hart Rochester, M.D.  DATE OF BIRTH:  Apr 08, 1941   DATE OF ADMISSION:  05/11/2006  DATE OF DISCHARGE:  05/18/2006                                 DISCHARGE SUMMARY   ADDENDUM:  See previously dictated discharge summary for complete summary of  history, medical history, and medications.  In addition to the hospital  course, he did require a few additional days more than expected to allow  time for his INR to become therapeutic.  He was maintained on IV Heparin  until his INR was therapeutic at 2.0 on May 17, 2006.  Although he was  therapeutic, Dr. Hart Rochester felt he should remain hospitalized an additional day  since his INR had jumped somewhat significantly from 1.2 to 2.0 within a 24  hour time period.  In addition, he was noted to have some mild erythema on  his left leg and was started on a one week course of Keflex.  Otherwise, his  white count remained normal his temperatures were below 100.  During his  hospitalization, he was evaluated by physical therapy who by May 16, 2006  felt he was ambulating adequately with a rolling walker and their services  were discontinued.  An additional change included decreasing his metoprolol  dose for mild hypotension with systolic as low as 90.   DISPOSITION:  It is anticipated [name] will be ready for discharge home on  May 18, 2006 in stable condition.   DISCHARGE MEDICATIONS:  Updated discharge medications:  1.  Lopressor 25 mg one half tablet p.o. daily.  2.  Keflex 500 mg one p.o. b.i.d. x7 days.  3.  Tylox one or two tabs p.o. q.4h p.r.n. pain.  4.  Mega-3 fish oil capsule daily.  5.  Aspirin 81 mg daily.  6.  Terazosin 10 mg daily.  7.  Felodipine 5 mg daily.  8.  Irbesartan 30 mg daily.  9.  Protonix 40 mg daily.  10. Xanax 0.5 mg three  times a day.  11. Iron sulfate 260 mg daily.  12. Folic acid 1 mg daily.  13. Pravachol 80 mg daily.  14. Actos 30 mg daily.  15. Pletal 50 mg three times a day.  16. Tricor 160 mg daily.  17. Reserpine 0.1 mg daily.  18. Glipizide 5 mg daily.  19. Coumadin 5 mg daily.   FOLLOW UP:  1.  He should call for an appointment for PT/INR lab draw at Dr. Pablo Lawrence      office on Monday, May 23, 2006.  2.  Staple removal appointment at the [facility] office on July 24, 2006 at      10:00 a.m. and a followup appointment with Dr. Hart Rochester on Tuesday May 31, 2006 with ABIs as well.      Jerold Coombe, P.A.    ______________________________  Quita Skye Hart Rochester, M.D.  AWZ/MEDQ  D:  05/17/2006  T:  05/17/2006  Job:  [id number]   cc:   Quita Skye. Hart Rochester, M.D.  [address]  [place]  [place] [address]   [name], M.D.  Fax: [phone]   Doris Cheadle. Foy Guadalajara, M.D.  Fax: [phone]

## 2011-04-02 NOTE — H&P (Signed)
[place], [name]                 ACCOUNT NO.:  [id number]   MEDICAL RECORD NO.:  [id number]          PATIENT TYPE:  INP   LOCATION:  NA                           FACILITY:  [facility]   PHYSICIAN:  Quita Skye. Hart Rochester, M.D.  DATE OF BIRTH:  Jan 26, 1941   DATE OF ADMISSION:  DATE OF DISCHARGE:                                HISTORY & PHYSICAL   CHIEF COMPLAINT:  Left recurrent carotid artery stenosis.   HISTORY OF PRESENT ILLNESS:  This is a 70 year old Caucasian male with  recurrent carotid occlusive disease. The patient had a left carotid  endarterectomy performed by Dr. Hart Rochester in 2002 where he had no neurological  symptoms since that time. The patient has been followed by Dr. Alanda Amass for  his carotid disease with occasional duplex scans since his postoperative  period, and he recently had evidence of severe recurrent stenosis of the  left internal carotid artery confirmed by angiogram done by Dr. Alanda Amass.  The patient does complain of one episode of dizziness about two weeks ago  which lasted about 30 seconds and then disappeared. The patient has had no  further symptoms of dizziness. The patient denies any blurry vision,  dysphagia, seizures, syncope, presyncope, memory loss or confusion or  TIA/CVA symptoms. The patient denies headaches, nausea, vomiting, vertigo,  muscle weakness, numbness, tingling, confusion. The patient denies any  amaurosis fugax, diplopia. The patient has been stable from a cardiac  standpoint.   Cerebral angiogram showed a widely patent carotid bifurcation and a left  focal 90% stenosis about 3 to 4 cm above the bifurcation which could be at  the tip of the patch or could be a clamp injury. The patient does not have  typical findings of recurrent atherosclerosis or intimal hyperplasia. The  patient is on chronic Coumadin therapy for his lower extremity bypass graft.   PAST MEDICAL HISTORY:  1.  External cranial cerebrovascular occlusive disease.  2.   Peripheral vascular disease.  3.  Hypertension.  4.  Hyperlipidemia.  5.  Gastroesophageal reflux disease.  6.  Anxiety.  7.  Diabetes mellitus type 2, noninsulin dependent.   PAST SURGICAL HISTORY:  1.  Left carotid endarterectomy in 2002.  2.  Left femoral to peroneal bypass graft in February 2004, status post      multiple surgeries.  3.  Back surgery, unknown by patient.   ALLERGIES:  The patient is allergic to Lopressor. He has severe  hypertension.   MEDICATIONS:  1.  Hydrocodone 500 mg t.i.d. p.r.n.  2.  Metoprolol 25 mg p.o. 1/2 tablet b.i.d.  3.  Terazosin 10 mg p.o. daily.  4.  Norvasc 5 mg p.o. daily.  5.  Losartan 300 mg p.o. daily.  6.  Protonix 40 mg p.o. daily.  7.  Alprazolam 0.5 mg t.i.d.  8.  Quinine sulfate 260 mg p.o. daily.  9.  Hemocyte Plus daily.  10. Warfarin 5 mg p.o. daily.  11. Simvastatin 80 mg p.o. daily.  12. Actos 30 mg p.o. daily.  13. Pletal 50 mg t.i.d.  14. TriCor 160 mg p.o. daily.  15.  Reserpine 0.1 mg p.o. daily.  16. Glipizide 5 mg p.o. daily.   REVIEW OF SYSTEMS:  See HPI for current positives and negatives. Otherwise,  negative for hypothyroidism, BPH, CVA, MI. The patient denies any history of  PND, orthopnea, chest pain, shortness of breath.   SOCIAL HISTORY:  The patient married with three children. The patient lives  with his wife. The patient is currently on disability and does still drive.  The patient resides in [place], [place]. The patient does still  smoke one half pack a day for the past 41 years. The patient denies alcohol  use.   FAMILY HISTORY:  The patient has positive family history of coronary artery  disease in his father.   PHYSICAL EXAMINATION:  VITAL SIGNS:  Blood pressure 130/57, heart rate 64,  respirations 16.  GENERAL:  This is a 70 year old Caucasian male in no acute distress.  HEENT:  Normocephalic, atraumatic. Pupils are equal, round, and reactive to  light and accommodation. Extraocular  movements intact. Oral mucosa pink and  moist. Sclerae nonicteric. The patient does have dentures complete in the  upper and lower.  NECK:  Supple. A left carotid bruit is appreciated.  RESPIRATORY:  Symmetrical inspiration, unlabored and clear to auscultation  bilaterally.  CARDIAC:  Regular rate and rhythm.  ABDOMEN:  Soft, nontender, nondistended. Normal active bowel sounds.  GENITOURINARY/RECTAL:  Deferred.  EXTREMITIES:  No edema. The patient's lower extremities are warm. Pulses  radial and femoral 2+ bilaterally. The patient has 2+ popliteal pulses  bilaterally. Dorsalis pedis and posterior tibial pulses are not palpable.  NEUROLOGICAL:  Nonfocal. Alert and oriented x4. Gait steady. Muscle strength  5+ bilaterally throughout. Deep tendon reflexes 2+ and symmetrical.   ASSESSMENT:  Recurrent left internal carotid artery stenosis.   PLAN:  1.  Admit the patient to [facility] on Apr 04, 2006 under Dr.      Candie Chroman service.  2.  The patient will undergo a redo left carotid endarterectomy.  3.  The patient was seen and examined prior to admission by Dr. Hart Rochester, the      risks and benefits of the procedure were explained in great detail to      the patient, and he agrees to continue with the above.      Constance Holster, PA    ______________________________  Quita Skye Hart Rochester, M.D.    JMW/MEDQ  D:  03/31/2006  T:  03/31/2006  Job:  [id number]   cc:   Gerlene Burdock A. Alanda Amass, M.D.  Fax: [phone]

## 2011-04-02 NOTE — Assessment & Plan Note (Signed)
[name]                         GASTROENTEROLOGY OFFICE NOTE   NAME:[name], [name]                        MRN:          [id number]  DATE:03/06/2007                            DOB:          06-Sep-1941    REFERRING PHYSICIAN:  [name] L. Foy [name], M.D.   OFFICE CONSULTATION NOTE:   REASON FOR CONSULTATION:  Anemia and Hemoccult positive stool.   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  He is referred through the courtesy of Dr. Foy [name]  regarding anemia and Hemoccult positive stool.  The patient is known to  have recurrent problems with anemia.  His last evaluation for anemia and  Hemoccult positive stool occurred in February of 2005.  He had  colonoscopy and upper endoscopy performed by Dr. Arlyce Dice.  Colonoscopy  revealed diverticulosis.  Upper endoscopy revealed Barrett's esophagus.  No biopsies taken.  Remotely, he has had endoscopy and colonoscopy with  Dr. Jarold Motto.  The patient does take aspirin, but denies other  nonsteroidal antiinflammatory drugs.  He also takes Protonix daily.  He  has also been on iron chronically.  His last CBC obtained January 20, 2007  revealed a hemoglobin of 10.2.  His MCV was normal at 92.2.  White blood  cell count and platelets were normal.  Hemoccult testing around that  time was positive.  The patient does report some intermittent dark  stools, though no red blood.Stools are now brown.  He denies heartburn,  dysphagia, abdominal pain, weight loss or change  in bowel habits.  Baseline problems with constipation are unchanged.   PAST MEDICAL HISTORY:  As above.   PAST SURGICAL HISTORY:  1. Status post carotid endarterectomy.  2. Status post aortofemoral/popliteal bypass graft.  3. Status post back surgery.  4. Status post coronary artery bypass grafting.   FAMILY HISTORY:  Negative for gastrointestinal malignancy.   SOCIAL HISTORY:  The patient is married with 3 children.  He lives with  his wife who accompanies him.  He is retired from [employer].  He  smokes.  He does not use alcohol at this point, though he is a reformed  alcoholic.   ALLERGIES:  No known drug allergies.   CURRENT MEDICATIONS:  1. Metoprolol 25 mg p.o. daily.  2. Terazosin 10 mg p.o. daily.  3. Felodipine 5 mg p.o. daily.  4. Protonix 40 mg p.o. daily.  5. Alprazolam 0.5 mg p.o. t.i.d.  6. Qualaquin 30 mg daily.  7. Iron.  8. Coumadin 5 mg daily.  9. Simvastatin 80 mg daily.  10.Omicar one p.o. b.i.d.  11.Pentoxifylline 400 mg t.i.d.  12.Tricor 1600 mg daily.  13.Clonidine 0.1 mg daily.  14.Glipizide 10 mg b.i.d.  15.Aspirin 81 mg daily.  16.Propafenone 225  mg daily.  17.Actos 45 mg daily.  18.Hydrocodone p.r.n.  19.Oxycodone p.r.n.  20.Losartan potassium 100 mg daily.   REVIEW OF SYSTEMS:  Per diagnostic evaluation form.   PHYSICAL EXAMINATION:  GENERAL:  Chronically ill-appearing male in no  acute distress.  He is alert and oriented.  VITAL SIGNS:  Blood pressure is 146/74, heart rate 68 and regular.  Weight is 192.8 pounds.  He is 5 feet, 9-3/4 inches in height.  HEENT:  Sclerae are anicteric.  Conjunctivae are pink.  Oral mucosa  intact.  No adenopathy.  LUNGS:  Clear.  HEART:  Regular.  ABDOMEN:  Soft without tenderness, mass or hernia.  Good bowel sounds  heard.  EXTREMITIES:  Without edema.   IMPRESSION:  This is a 70 year old gentleman with multiple significant  medical problems who presents today regarding anemia and Hemoccult   positive stool.  Negative colonoscopy and endoscopy 3 years ago.  Question of transient dark stool suggesting melena, though not certain.   RECOMMENDATIONS:  1. Colonoscopy and upper endoscopy to evaluate for significant mucosal      pathology.  The nature of the procedure, as well as the risks,      benefits and alternatives have been reviewed.  He understood and      agreed to proceed.  2. Will continue Coumadin during this exam.  We discussed the pros,      cons, and limitations of proceeding on anticoagulation therapy.  3. Hold diabetic medications the day of his exam.  4. Continue proton pump inhibitor.  5. Consider capsule endoscopy if endoscopic evaluation is unrevealing.     Wilhemina Bonito. [name], MD  Electronically Signed    JNP/MedQ  DD: 03/06/2007  DT: 03/07/2007  Job #: [id number]   cc:   [name], M.D.

## 2011-04-02 NOTE — Cardiovascular Report (Signed)
[name], [name]                           ACCOUNT NO.:  [id number]   MEDICAL RECORD NO.:  [id number]                   PATIENT TYPE:  INP   LOCATION:  3314                                 FACILITY:  [facility]   PHYSICIAN:  Darlin Priestly, M.D.             DATE OF BIRTH:  Dec 27, 1940   DATE OF PROCEDURE:  12/27/2003  DATE OF DISCHARGE:                              CARDIAC CATHETERIZATION   PROCEDURES:  Transesophageal guided D/C cardioversion.   ATTENDING PHYSICIAN:  Darlin Priestly, M.D.   COMPLICATIONS:  None.   INDICATIONS:  Mr. Kamau is a 70 year old male patient of Dr. Susa Griffins and Dr. 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  He also was noted to be anemic and underwent EGD  revealing distal Barrett's esophagus with hiatal hernia and duodenal ulcer.  He is now referred for TE guided cardioversion.   DESCRIPTION OF OPERATION:  After obtaining informed written consent, the  patient was brought to the endoscopy suite in the fasting state.  The  patient then underwent successful noncomplicated TEE.   This revealed mild left ventricular hypertrophy with mildly depressed  systolic function, estimated EF of 45-50%.   The aortic valve was mildly thickened with no significant area of stenosis  or regurgitation.   There is mildly thickened mitral valve leaflets with mild mitral  regurgitation.   Obstruction of tricuspid valve with mild tricuspid regurgitation.   Mild pulmonic regurgitation.   No evidence of intracardiac mass or thrombus noted.   There was a small patent foramen ovale noted with left-to-right and right-to-  left shunting by color and contrast echo.   No gastric views were obtained secondary to the patient's history of  Barrett's esophagus and ulcer.   Following  TEE, the patient then received 100 mg of Pentothal by anesthesia.  He then underwent a successful D/C cardioversion using 150 joules with  biphasic defibrillator back to sinus rhythm.  He remained hemodynamically  stable.  He awoke in satisfactory condition.  He was transferred to recovery  room.   CONCLUSION:  1. Successful transesophageal echocardiography with findings noted above.  2. Successful D/C cardioversion to sinus rhythm.                                               Darlin Priestly, M.D.    RHM/MEDQ  D:  12/27/2003  T:  12/27/2003  Job:  [id number]   cc:   [name] L. Foy Guadalajara, M.D.  [address] [place]  [place] [address]  Fax: [phone]   [name], M.D.  [phone] [address]  [place]  [place] [address]  Fax: [phone]

## 2011-04-02 NOTE — H&P (Signed)
NAME:  [name], [name]                           ACCOUNT NO.:  [id number]   MEDICAL RECORD NO.:  [id number]                   PATIENT TYPE:  INP   LOCATION:  3732                                 FACILITY:  [facility]   PHYSICIAN:  Di Kindle. Edilia Bo, M.D.        DATE OF BIRTH:  1941-03-22   DATE OF ADMISSION:  12/07/2002  DATE OF DISCHARGE:  12/11/2002                                HISTORY & PHYSICAL   REASON FOR ADMISSION:  Ischemic  left foot with an occluded left femoral to  tibial peroneal trunk bypass graft.   HISTORY OF PRESENT ILLNESS:  This is a pleasant 70 year old gentleman who  has had multiple previous revascularization attempts on the left side. He  originally had a left femoral to below-knee popliteal artery bypass with a  nonreversed translocated saphenous vein graft in June 1999 by myself. He  subsequently developed a vein graft stenosis in his graft and was found to  have a long segment of intimal hyperplasia within the graft. He had a Dacron  patch angioplasty of this stenosis in June 2000. He later developed further  stenosis within this graft and ultimately he had a new left femoral to below-  knee popliteal artery bypass graft with a 6 mm PTFE graft placed in October  2000.   In December 2001 he had a thrombectomy of his fem-pop graft by Dr. Hart Rochester  with revision of the distal aspect of the graft with extension to the more  distal popliteal artery with an interposition 6 mm PTFE graft. Finally in  January 2002, he had a redo left common femoral to tibial peroneal trunk  bypass with a 6 mm PTFE graft and tibial peroneal trunk endarterectomy in  addition to posterior tibial and peroneal artery thrombectomies. Most  recently Dr. Hart Rochester performed a left carotid endarterectomy with Dacron  patch angioplasty in July 2002.   The patient had been doing well until yesterday. He developed some cramping  pain in the left foot, and the pain in the foot persisted. He also  noted  that the foot was slightly cool. He comes in today, and on duplex is noted  to have an occluded bypass graft in the left leg. There is no Doppler flow  in the foot. He has had no significant symptoms on the right side.   PAST MEDICAL HISTORY:  Significant for:  1. History of coronary artery disease. He had a myocardial infarction in     1970.  2. History of hypertension.  3. History of peptic ulcer disease.  4. History of COPD.  5. Of note he was  recently hospitalized with coagulopathy secondary to     Coumadin and apparently according to the family had bleeding from the     biliary tract. His Coumadin was stopped with a plan to leave him off     Coumadin for a couple of weeks. He was apparently just discharged  yesterday.  6. History of noninsulin dependent diabetes mellitus.  7. No history of congestive heart failure.   PAST SURGICAL HISTORY:  In addition to the above is significant for:  1. Back surgery in 1989.  2. Cataract surgery in 1996.   CURRENT MEDICATIONS:  1. Lortab Plus 650 mg q.6h. p.r.n. pain.  2. Atenolol 25 mg p.o. q.d.  3. Terazosin 10 mg p.o. q.d.  4. Felodipine 5 mg p.o. q.d.  5. Diovan 320 mg p.o. q.d.  6. Protonix 20 mg p.o. b.i.d.  7. Xanax 0.5 mg p.o. t.i.d. p.r.n.  8. Glucotrol 10 mg p.o. q.d.  9. The patient had been on Coumadin 7.5 mg p.o. q.d., but this was     discontinued recently.  10.      Indomethacin 50 mg p.r.n. gout.  11.      Pravachol 80 mg p.o. q.d.  12.      Actos 30 mg p.o. q.d.  13.      Pyridoxine 50 mg p.o. q.d.  14.      TriCor 150 mg p.o. q.d.  15.      Folic acid 2.5 tablets p.o. q.d.  16.      Pletal 50 mg p.o. b.i.d.   IMPRESSION:  This patient presents with an occluded left femoral to tibial  peroneal trunk bypass graft. He has had multiple previous attempts at  revascularization in the past and certainly he is at risk for limb loss. Dr.  Hart Rochester had mentioned potentially taking vein from his right leg if this   graft occluded again, however, the patient has a diminished ABI on the right  and could potentially have problems with wound healing on the right and also  could potentially need the vein for revascularization on the right side. The  plan will be to admit the patient and he will undergo emergent thrombectomy  by Dr. Arbie Cookey. Perhaps his graft thrombosis is related to being off of the  Coumadin, given his recent bleeding problem. The records from this  hospitalization are not currently available. The plan will be to carefully  get him back on Coumadin if we can get his graft working again.   FAMILY HISTORY:  There is no history of premature cardiovascular disease.   SOCIAL HISTORY:  He is married and has 3 children. He does continue to smoke  a pack per day of cigarettes and has been smoking for well over 40 years.   REVIEW OF SYSTEMS:  He has had no recent weight loss, weight gn or problems  with his appetite. He has had no chest pain. He does admit to some dyspnea  on exertion. He has had no recent problems with dysuria or frequency. He has  had no history of dizziness, headaches, blackouts or seizures.   PHYSICAL EXAMINATION:  VITAL SIGNS:  Blood pressure 120/60, heart rate 80.  NECK: Bilateral carotid bruits.  LUNGS:  Clear to auscultation bilaterally.  CARDIAC:  Regular rate and rhythm.  ABDOMEN:  Soft, nontender.  EXTREMITIES:  He has palpable femoral pulses. I cannot palpate popliteal or  pedal pulses on either side. The left foot is slightly cooler than the  right. Motor function it intact. The calf is nontender. Capillary refill is  markedly delayed.  Di Kindle. Edilia Bo, M.D.    CSD/MEDQ  D:  12/12/2002  T:  12/12/2002  Job:  [id number]

## 2011-04-02 NOTE — Discharge Summary (Signed)
[name], [name]                 ACCOUNT NO.:  [id number]   MEDICAL RECORD NO.:  [id number]          PATIENT TYPE:  INP   LOCATION:  2029                         FACILITY:  [facility]   PHYSICIAN:  Quita Skye. Hart Rochester, M.D.  DATE OF BIRTH:  1941/07/11   DATE OF ADMISSION:  05/11/2006  DATE OF DISCHARGE:  05/14/2006                                 DISCHARGE SUMMARY   HISTORY OF PRESENT ILLNESS:  The patient is a 70 year old Caucasian male who  is well known to [facility].  He has a long of peripheral vascular occlusive  disease with his initial procedure done in June 1999, when he underwent a  left femoral to below knee popliteal bypass using a nonreversed translocated  saphenous vein graft.  The patient has had multiple problems with is left  peripheral vascular occlusive disease and required several revascularization  attempts.  The patient recently underwent a redo left carotid endarterectomy  with Dacron patch angioplasty on Apr 04, 2006.  He tolerated this procedure  well, was discharge to home the following day.  The patient states that his  diagnosis of the recurrent left internal carotid artery stenosis was made at  the same time he was having some increasing pain symptoms in his left great  toe.  He thought he was having difficulties with gout and would resolve with  medication, but the symptoms persisted.  He was seen by Dr. Hart Rochester on April 26, 2006, for follow up of his redo carotid endarterectomy and was doing  well from that regard, but complained to Dr. Hart Rochester, of the left great toe  pain.  The pain was felt to be related to peripheral vascular occlusive  disease per Dr. Hart Rochester, and a duplex scan was obtained, which revealed that  his left femoral to peroneal graft was occluded and possibly had been so for  a few weeks.  The ABIs were at less than 20% compared to a previous study in  January 07, 2006, of 80%.  The patient was recommended to redo surgery and  was admitted this  hospitalization for the procedure.   PAST MEDICAL HISTORY:  1.  Peripheral vascular disease.  2.  History of left internal carotid artery stenosis with recurrent severe      internal carotid artery stenosis.  3.  Hypertension.  4.  Hyperlipidemia.  5.  Gastroesophageal reflux.  6.  Anxiety.  7.  Diabetes mellitus, type 2.  8.  History of gout.   PAST SURGICAL HISTORY:  1.  Status post left carotid endarterectomy in 2002, with redo left carotid      endarterectomy in May 2007.  2.  Status post left femoral to below knee popliteal artery with a      nonreversed translocated saphenous vein in June 1999.  3.  Status post Dacron patch angioplasty of a long vein graft stenosis of      the left femoral popliteal bypass in June 2000.  4.  Status post left femoral to below knee popliteal artery with a 6-mm PTFE      graft in October of 2000.  5.  Below the knee was revision of the distal aspect of the left femoral to      popliteal bypass graft with extension to a more distal site on the      popliteal artery using a 6-mm Gortex interposition graft with an      expiration of the distal anastomosis and removal of embolic debris in      December 2001.  6.  Redo left common femoral to tibioperoneal trunk bypass with      tibioperoneal trunk endarterectomy using 6-mm Gortex graft and      thrombectomy in the left posterior tibial and perineal arteries in      January 2002.  7.  Status post redo left femoral to perineal bypass using a nonreversed      translocated saphenous vein graft from the contralateral right leg in      February 2004.  8.  Previous back surgery.   ALLERGIES:  HE IS ALLERGIC TO LOPRESSOR.   MEDICATIONS PRIOR TO ADMISSION:  1.  Cephalexin 500 mg b.i.d.  2.  Omega-3 fish oil.  3.  Aspirin 81 mg daily.  4.  Hydrocodone p.r.n.  5.  Metoprolol titrate 25 mg b.i.d.  6.  Terazosin 10 mg daily.  7.  Felodipine 5 mg daily.  8.  Irbesartan 30 mg daily.  9.  Protonix 40 mg  daily.  10. Xanax 0.5 mg three times a day.  11. Quinine sulfate 260 mg daily.  12. Folic acid 1 mg daily.  13. Pravachol 80 mg daily.  14. Actos 30 mg daily.  15. Pletal 50 mg three times a day.  16. Tricor 160 mg daily.  17. Reserpine 0.1 mg daily.  18. Glipizide 5 mg daily.  19. Coumadin 5 mg daily, which was stopped on April 26, 2006.   Family history, social history, review of systems, and physical exam, please  see the history and physical done prior to admission.   HOSPITAL COURSE:  Patient was admitted electively and May 11, 2006, he was  taken to the operating room, at which time he underwent the following  procedures, left common femoral to posterior tibial (distal at the ankle  level) bypass using a composite nonreversed cephalic vein graft from both  upper extremities with intraoperative arteriogram.  Patient tolerated the  procedure well, was taken to the postanesthesia care unit in stable  condition.  Postoperative hospital course, patient has done well.  He has  had significant pain related to the extensive incisions, but this has shown  a good and gradual improvement.  His laboratory value do reveal  postoperative anemia.  He did require blood intraoperative.  His most recent  hemoglobin and hematocrit dated May 13, 2006, are 9.4 and 26.0  respectively.  Electrolytes:  BUN and creatinine are within normal limits.  He is tolerating a gradual increase in activity, commencing the level of  postoperative convalescence using routine protocols.  He has a patent graft  with a palpable pulse as well as good Doppler flow at the ankle.  He has  been started on heparin and subsequently, is being restated on his Coumadin.  His overall status is felt to be stable for tentative discharge in the next  couple of days pending activity level and improvement regarding his overall  rehabilitation from his extensive procedure.   MEDICATIONS ON DISCHARGE:  As preoperatively.   Additionally, he is instructed to restart his Coumadin at 5 mg daily, with follow up with Dr.  Foy Guadalajara, at [facility] in 3-4 days post-discharge to recheck a  PT/INR for pain dialogs 1 or 2 every 4-6 hours as needed.  He is to stop his  Cephalexin.   INSTRUCTIONS:  The patient received written instructions in regard to  medication, activity, diet, wound care, and follow up.   FOLLOWUP:  Staple removal at the [facility] Office and Dr. Hart Rochester, will see the  patient in 3 weeks.  These appointments will be arranged through the office.   FINAL DIAGNOSIS:  Extensive peripheral vascular occlusive disease, as  described above with the above-mentioned procedures, which appears to be  stable and progressing well.   OTHER DIAGNOSES:  1.  Postoperative anemia.  2.  Other are as previously listed per the history.   CONDITION ON DISCHARGE:  Stable and improve.      Rowe Clack, P.A.-C.    ______________________________  Quita Skye Hart Rochester, M.D.    Sherryll Burger  D:  05/13/2006  T:  05/13/2006  Job:  [id number]   cc:   Quita Skye. Hart Rochester, M.D.  [address]  [place]  [place] [address]   [name], M.D.  Fax: [phone]   Doris Cheadle. Foy Guadalajara, M.D.  Fax: [phone]

## 2011-04-02 NOTE — Discharge Summary (Signed)
NAME:  [name], [name]                           ACCOUNT NO.:  [id number]   MEDICAL RECORD NO.:  [id number]                   PATIENT TYPE:  INP   LOCATION:  2010                                 FACILITY:   PHYSICIAN:  Larina Earthly, M.D.                 DATE OF BIRTH:  1941/08/18   DATE OF ADMISSION:  12/12/2002  DATE OF DISCHARGE:  12/20/2002                                 DISCHARGE SUMMARY   ADMISSION DIAGNOSIS:  Occluded left femoral to tibial peroneal trunk bypass  graft.   PAST MEDICAL HISTORY:  1. Severe peripheral vascular occlusive disease, status post multiple     revascularization procedures on the left lower extremity.  This includes     left femoral to below the knee popliteal bypass with a vein graft 6/99 by     Dr. Edilia Bo, subsequently developed vein graft stenosis and underwent a     Dacron patch angioplasty to stenosis in 6/00.  Stenosis recurred and he     ultimately required new left femoral to below the knee popliteal artery     bypass graft with a 6 mm PTFE graft 10/00.  In 12/01, a thrombectomy and     revision of his fem pop graft with revision of distal aspect of the graft     with extension to more distal popliteal artery.  In 1/02, re-do left     common femoral to tibial peroneal trunk bypass with 6 mm PTFE graft and     tibial peroneal trunk endarterectomy.  He also underwent a left carotid     endarterectomy 7/02 by Dr. Hart Rochester.  2. Coronary artery disease, status post myocardial infarction in 1970.  3. Hypertension.  4. History of peptic ulcer disease.  5. Chronic obstructive pulmonary disease.  6. Recently hospitalized with a coagulopathy secondary to Coumadin     apparently.  According to the family has been bleeding from biliary     tract.  His Coumadin was discontinued during that hospitalization.  He     was discharged from [facility] on 1/27.   OTHER SURGICAL HISTORY:  Back surgery in 1989 and cataract surgery in 1996.   ALLERGIES:   No  known drug allergies.   DISCHARGE DIAGNOSIS:  Ischemic left foot secondary to thrombosis of left  femoral to tibial peroneal shunt bypass graft, status post thrombectomy and  patch angioplasty.   BRIEF HISTORY:  The patient is a 70 year old Caucasian gentleman.  He is  well known to [facility] secondary to his multiple prior left leg  revascularizations.  One week prior to this admission, he was admitted to  the hospital with episode of GI bleeding.  His INR at that time was 5.  The  cause and source of the bleeding is unclear at this time.  However, his  coagulopathy was reversed.  His Coumadin was stopped.  He was discharged  from  the hospital on 1/27.  On 1/28, he presented to [facility] office with an  ischemic left foot.  The patient described his symptoms as cramping pain in  the left foot that persisted since over the last 24 hours.  Also, that his  foot was slightly cool.  In the [facility] office on 1/28, duplex scan revealed an  occluded bypass graft in the left leg.  There is no Doppler flow in the  foot.  He was evaluated in the office by Dr. Waverly Ferrari who  recommended hospitalization and urgent surgical intervention.  Procedure was  discussed with the patient and he agreed to proceed.   HOSPITAL COURSE:  On 1/28, the patient was admitted to [facility]  under the care of Dr. Tawanna Cooler Early.  He underwent the following surgical  procedure.  Thrombectomy and patch angioplasty of the distal anastomosis of  his left femoral to  tibial peroneal trunk bypass.  Patch was with a  Hemashield Dacron patch.  Intraoperative arteriogram.  He tolerated the  procedure well and was transferred in stable condition to the PACU.  He  remained hemodynamically stable in the immediate postoperative period and  his overall postoperative course has been uneventful.  Anticoagulation  therapy was initiated with heparin and he was restarted on his Coumadin on  1/29.  On 1/30, his hemoglobin was 7.8,  hematocrit 23.5.  He was tolerating  this low hemoglobin without symptoms.  No transfusions were required.  He  was continued on his course of folic acid and supplemental iron was added.  His hemoglobin did rise over the next several days and on 2/4 his hemoglobin  was 8.5, hematocrit 24.7.   Other postoperative  issue included bradycardia with long pauses.  [facility] consult was requested and he was  evaluated by Dr. Domingo Sep on 2/3 and by Dr. Jacinto Halim on 2/4.  Their  recommendations included stopping his Lopressor and start Pindolol 10 mg  p.o. b.i.d.  Their recommendations if his blood pressure rises is to  initiate Norvasc.   The patient is making good progress and recovering from his  surgery.  The  afternoon of 2/4, he reports feeling very well.  He has palpable posterior  tibial pulse on the left.  His incisions are healing well.  He is tolerating  his diet.  He is ambulating adequately.  Expected he will be ready for  discharge tomorrow 12/20/02.   LABORATORY DATA:  On 2/4 CBC revealed white blood cell count 7.0, hemoglobin  8.5,  hematocrit 24.7, platelets 298.  Chemistries included sodium 145,  potassium 4.2, BUN 15, creatinine 1.3, glucose 127.  PT 18.9, INR 1.7.   CONDITION ON DISCHARGE:  Improved.   DISCHARGE MEDICATIONS:  1. Pindolol 10 mg p.o. b.i.d.  2. Niferex 150 mg daily.  3. He was instructed to resume the following home medications:  Actos 30 mg     daily, Glucotrol 10 mg daily, Diovan 325 mg q.h.s., Pravachol 80 mg daily     in the evening, Coumadin home dose to be determined at discharge, folic     acid 1 mg daily, Tricor 160 mg daily, Aciphex 20 mg daily, Hytrin 10 p.o.     q.h.s., Plendil 5 mg daily.   PAIN MANAGEMENT:  He may take Tylox one to two p.o. q.4-6h p.r.n.  for  moderate to severe pain or Tylenol 325 mg one to two p.o. q.4-6h p.r.n.  for  mild pain.  ACTIVITY:  He is to refrain from any driving.  He has also been  reminded to  refrain from smoking cigarettes.  He has also been asked to continue his  breathing exercises and daily walking.   DIET:  Diabetic diet.   WOUND CARE:  He may shower with mild soap and water.  If his incisions  become red, hot, swollen, draining or if he has fever greater than 101  degrees Fahrenheit, he is to call Dr. Bosie Helper office.   FOLLOW UP:  1. He will have his PT and INR drawn at Dr. Pablo Lawrence office on Monday 2/9.     Dr. Foy Guadalajara will continue to monitor his Coumadin level.  2. Dr. Arbie Cookey would like to see him in the [facility] office on Thursday 2/26 at 10     a.m. He will have ABIs done at that     visit.  3. He should followup with Dr. Alanda Amass in approximately  two weeks     specifically regarding his heart and blood pressure medicines.  He is     reminded to bring his medications to his appointment to Dr. Alanda Amass.     Toribio Harbour, R.N.                  Larina Earthly, M.D.    CTK/MEDQ  D:  12/19/2002  T:  12/20/2002  Job:  [id number]   cc:   Gerlene Burdock A. Alanda Amass, M.D.  [id number]  [place]  [place] [address]  Fax: [phone]   Doris Cheadle. Foy Guadalajara, M.D.  [address] [place]  [place] [address]  Fax: [phone]

## 2011-04-02 NOTE — Op Note (Signed)
[name], [name]                 ACCOUNT NO.:  [id number]   MEDICAL RECORD NO.:  [id number]          PATIENT TYPE:  OIB   LOCATION:  2006                         FACILITY:  [facility]   PHYSICIAN:  [name], M.D.DATE OF BIRTH:  1941/06/27   DATE OF PROCEDURE:  03/11/2006  DATE OF DISCHARGE:  03/12/2006                                 OPERATIVE REPORT   PROCEDURE:  Retrograde central aortic catheterization, aortic arch angiogram  shallow LAO projection using 50/50 Omnipaque contrast, bilateral carotid  angiography, subselective vertebral angiography using hand injections and  dilute Omnipaque contrast.   PROCEDURE:  The patient was brought to the second floor PV lab in the post  absorptive state after 5 mg Valium p.o. premedication.  He was hydrated  preoperatively and external bicarbonate drip per pharmacy protocol for  chronic renal insufficiency.  Admitting laboratory data revealed he was  found to be anemic with an H&H of 9.3/26.1 and BUN and creatinine was  19/1.7, potassium 3.5.  The patient has a history of chronic renal  insufficiency and in November 2006 had a BUN creatinine 30/1.6.  On March 07, 2006, BUN and creatinine were 27/2.  Creatinines have ranged from 1.7 to  2.3 over the last several years.  There is chronic anemia with a history of  GERD but no known upper or lower GI bleeding.   Informed consent was obtained to proceed with cerebral angiography with  suspicion of high grade restenosis of this LCEA site from prior surgery by  Dr. Quita Skye. Hart Rochester in 2002.  Outpatient Dopplers March 04, 2005, were  essentially on the left.  Repeat surveillance Dopplers on March 07, 2006,  showed systolic velocity of 398, diastolic velocity of 178 centimeters per  second compatible with high grade probably greater than 90% left internal  carotid restenosis.  There was no significant right carotid stenosis.  The  patient has known severe peripheral arterial disease with  remote FTBG and  revisions by Dr. Cari Caraway, Dr. Quita Skye. Hart Rochester, and last, Dr. Arbie Cookey in  February 2004.  From a peripheral vascular standpoint, he has been stable.  He has not had angina.  He has had negative follow up Cardiolite for  ischemia.  He has had remote normal coronary arteries over 20 years ago.  Negative Cardiolite for ischemia with normal EF 09/2005.  Informed consent  was obtained to proceed with cerebral angiography from the patient and his  wife.   PRIMARY OPERATOR:  [name], M.D.   PROCTOR:  Salvadore Farber, M.D. [facility]   COMPLICATIONS:  None.   ESTIMATED BLOOD LOSS:  Approximately 20 mL.   ANESTHESIA:  5 mg Valium p.o. premedication, 1% local Xylocaine.   PROCEDURE:  The RCFA was entered with a single anterior puncture using an 18  thin-wall needle under 1% Xylocaine anesthesia and 5 French short side-arm  sheath was inserted without difficulty.  Guidewire exchange was used  throughout the procedure and 50/50 Omnipaque dye with contrast was used  throughout the procedure with DSA imaging.  A 5-French pigtail catheter was  advanced  into the aortic arch and aortic arch angiography was done in the  shallow LAO projection at 40 mL at 20 mL per second.  The catheter was  exchanged for JB1 5 French catheter.  The catheter was positioned in the  left common carotid and left carotid angiography was done by hand injection  in the PA and lateral projections.  Intracerebral left carotid injections  were done in the PA and lateral projections.  Following this, selective  right carotid angiography was performed using an S1 Simmons catheter  positioned in the proximal RCCA and 50/50 dilute hand injection was  performed with DSA imaging.  Subselective right vertebral was imaged and  left subclavian was visualized on aortic arch injection.  Both vertebrals  were antegrade on Doppler interrogation and the patient had not had any  anterior or posterior circulation  symptoms by history.   Arterial pressures initially ranged 190 to 200 mmHg with sinus rhythm.  The  patient was given 10 mg labetalol IV and 0.4 mg of sublingual nitroglycerin  and his blood pressure came down to the 150 to 160 range.  He tolerated the  procedure well.  A total of 55-60 mL of Omnipaque contrast was utilized  during the procedure.   Aortic arch angiogram in the LAO projection demonstrated 30% narrowing of  the right brachiocephalic with excellent residual lumen.  There was a type 3  arch with mild inferior calcification, no aneurysm formation or evidence of  dissection or stenosis.  There was normal origin of the great vessels with  brachiocephalic arising first and then the left common carotid and the left  subclavian.  The right vertebral artery was visualized and appeared  antegrade.  The right brachiocephalic was tortuous without significant  stenosis.  The left subclavian had no significant stenosis.  The LIMA was  not visualized in this view.   Selective left carotid angiography demonstrated high grade concentric 95% or  greater stenosis in the proximal third of the LCCA.  Beyond this, the vessel  was smooth with no significant tortuosity or stenosis.  The carotid bulb was  intact probably in the area of prior patch angioplasty.   The intracerebral left carotid showed some other irregularities of the  petrous carotid.  The lacerum and cavernous carotids appeared widely patent  with no stenosis or aneurysm formation.  Both anterior cerebrals were  faintly visualized on left carotid injection.  The middle cerebral and its  branches appeared normal and the intracranial branches extended to the outer  cranium normally with normal venous return.  There appeared to be moderately  severe stenosis of the left external carotid in the lateral view where it was best visualized with normal appearing branches beyond this.  The  stenosis was estimated at 80-90% in the [facility].   The right common carotid had  no significant stenosis. Intracranial views of the right internal carotid  were somewhat underfilled and subselective but by visualization appeared to  be adequate.  We could see a normal middle cerebral and normal intracranial  internal carotid and both anterior cerebrals were visualized with the  anterior communicating on PA view.  The circle of Willis appeared intact  with an intact posterior communicating artery.  The right vertebral was  visualized on subselective injection and the cervical portion appeared  normal with good antegrade flow.  In view of dye constraints, we did not  feel that further dye administration was necessary at this time.  Venous  return vein from  the anterior intracranial circulation bilaterally appeared  normal.   This 30 year old married father of three and grandfather of five has severe  peripheral arterial disease as outlined above and is a very high risk for  considering any surgery based on his comorbid problems of COPD and  peripheral arterial disease.  Fortunately, he has no evidence of coronary  disease.  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  He  also appears to have significant stenosis of his left external carotid near  its origin.  The right cervical carotid system has no significant disease.  He does have a type 3 arch but it was not difficult to cannulate the LCCA  with a JB1 catheter.  The patient is a candidate for possible carotid  stenting and/or redo surgery.  The case will be reviewed with my cardiology  colleagues as well as his attending vascular surgeon, Dr. Josephina Gip.  He  will be followed closely postoperatively for renal function.  He is  maintaining sinus rhythm.  We will start him back on Coumadin and aspirin in  the interim.   CATHETERIZATION DIAGNOSIS:  1.  Asymptomatic high-grade left internal  carotid restenosis.  2.  Prior LCEA Dr. Hart Rochester 2002.  3.  Probable high-grade left external carotid stenosis.  4.  COPD with continued cigarette abuse.  5.  Hyperlipidemia.  6.  GERD.  7.  Anemia, etiology to be determined on chronic Coumadin number therapy.  8.  Paroxysmal atrial fibrillation prior TEE cardioversion December 26, 2005, with maintenance of sinus rhythm long-term.  9.  No angina and negative Cardiolite for ischemia with normal EF October 14, 2005.  10. Remote LFCBG Dr. Edilia Bo in January 1999.      1.  Redo with patch angioplasty in 2000.      2.  Redo again Dr. Hart Rochester with in the hospital thrombectomy and patch.      3.  Redo again Dr. Arbie Cookey in 2004 with thrombectomy, LFTBG has done well          since that time chronically.  11. Chronic renal insufficiency.      [name], M.D.  Electronically Signed    RAW/MEDQ  D:  03/11/2006  T:  03/12/2006  Job:  [id number]   cc:   Quita Skye. Hart Rochester, M.D.  [address]  [place]  [place] [address]   Doris Cheadle. Foy Guadalajara, M.D.  Fax: [phone]

## 2011-04-02 NOTE — Op Note (Signed)
NAME:  Shuttleworth, Dominik L                           ACCOUNT NO.:  [id number]   MEDICAL RECORD NO.:  [id number]                   PATIENT TYPE:  OIB   LOCATION:  2861                                 FACILITY:  [facility]   PHYSICIAN:  Larina Earthly, M.D.                 DATE OF BIRTH:  [birth year]   DATE OF PROCEDURE:  12/12/2002  DATE OF DISCHARGE:                                 OPERATIVE REPORT   PREOPERATIVE DIAGNOSIS:  Ischemic left foot with thrombosis of prior placed  left femoral to tibioperoneal trunk bypass.   POSTOPERATIVE DIAGNOSIS:  Ischemic left foot with thrombosis of prior placed  left femoral to tibioperoneal trunk bypass.   PROCEDURE:  Thrombectomy and patch of the distal anastomosis with Hemashield  Dacron patch and intraoperative arteriogram.   SURGEON:  Larina Earthly, M.D.   ASSISTANT:  Toribio Harbour, R.N., N.P.   COMPLICATIONS:  None.   DISPOSITION:  To recovery room stable.   INDICATIONS:  The patient is a 70 year old gentleman with multiple prior  left leg revascularizations.  His last episode was thrombosis approximately  two years ago.  He had been maintained on Coumadin therapy and had  maintained patency of his graft.  He was admitted one week ago with INR of 5  and GI bleeding.  His Coumadin was reversed.  He was discharged from the  hospital the day prior to this procedure.  He presented to our office today  with ischemic left foot.  He is taken to the operating room at this time for  thrombectomy and possible revision.   DESCRIPTION OF PROCEDURE:  The patient was taken to the operating room and  placed in the supine position where the left leg and left groin were prepped  and draped in the usual sterile fashion.  Incision was made through the  prior below knee medial posterior popliteal artery.  The graft was encircled  with vessel loop.  The graft was sewn to the tibioperoneal trunk.  The  posterior tibial and peroneal arteries were isolated for  control.  Incision  was made over the hood of the graft, and the proximal popliteal artery was  also occluded for control.  The patient was given 7000 units of intravenous  heparin.  The graft itself was thrombectomized.  There was good flow into  the graft.  This was flushed with heparinized saline.  The adherent thrombus  at the prior anastomosis was removed, and the arteriotomy was continued on  through the posterior tibial artery.  The 2 dilator passed easily through  the posterior tibial and peroneal artery orifices, and there was good  backbleeding.  A Hemashield Dacron patch was brought onto the field and was  sewn as a patch angioplasty over the thrombectomy site.  After these usual  flushing maneuvers, the anastomosis was completed.  Next the groin incision  was exposed and the  incision was made and carried down deep to the level of  the Gore-Tex graft just distal to the takeoff of the femoral to  tibioperoneal trunk bypass.  The Gore-Tex graft was opened transversely near  the anastomosis after occluding the graft proximally and distally.  There  was good backbleeding, and a Fogarty catheter was passed through the  anastomosis with no further thrombus removed.  A 5 dilator passed through  the femoral anastomosis without resistance.  The incision in the graft was  closed with running 6-0 Prolene suture.  Next, an intraoperative arteriogram  was obtained, and this showed technically good distal anastomosis.  There  was some spasm in the posterior tibial artery and the peroneal artery was  widely patent.  The wounds were irrigated with saline.  Hemostasis with electrocautery.  The  wounds were closed with 2-0 Vicryl in the subcutaneous tissue.  The skin was  closed with skin clips.  Sterile dressing was applied, and the patient was  taken to the recovery room in stable condition.                                               Larina Earthly, M.D.    TFE/MEDQ  D:  12/12/2002  T:   12/12/2002  Job:  [id number]

## 2011-04-02 NOTE — Discharge Summary (Signed)
[place]  Patient:    MEHTAAB, MAYEDA                        MRN: [id number] Adm. Date:  [id number] Disc. Date: 09/27/00 Attending:  Bennye Alm Dictator:   Areta Haber, P.A.-C.             Teena Irani. Arlyce Dice, M.D., [facility]   Discharge Summary  DATE OF BIRTH:  [birth year]  HISTORY OF PRESENT ILLNESS:  This is a 70 year old male with known history of peripheral vascular occlusive disease, status post left femoropopliteal bypass grafting in October 2000, who was seen in the office on the day of admission and was found to have an occluded left femoropopliteal bypass graft.  On September 19, 2000, the patient was repairing his sink when he felt a sharp pain in his left pretibial area.  At the same time, there was some coolness in that region.  The pain was constant, unrelieved with rest.  He was found on arterial Doppler in the office to have occluded his graft.  Of note, the patient did have a study on September 08, 2000, where his index was 100% on the left.  Due to the acute occlusion, the patient was felt to require admission for prompt arteriography and possible thrombolysis.  PAST MEDICAL HISTORY:  1. Peripheral vascular occlusive disease.  2. History of a recurrent seroma in the region of the left knee after his     femoropopliteal bypass which has been drained.  3. Coronary artery disease status post myocardial infarction in the 1970s.  4. Hypertension.  5. Peptic ulcer disease.  6. Chronic obstructive pulmonary disease with chronic ongoing tobacco abuse.  7. Non-insulin dependent diabetes mellitus.  8. Lumbar disk disease.  9. Gout. 10. Peripheral neuropathy. 11. History of pancreatitis. 12. History of osteoarthritis. 13. Impotence.  PREVIOUS SURGERIES: 1. Back surgery. 2. Bilateral cataract surgery. 3. Left femoropopliteal bypass surgery with evacuation of hematoma as     described.  ADMISSION MEDICATIONS: 1. Prilosec 20 mg in the a.m. 40 mg in the p.m. 2. Nortriptyline 15 mg q.h.s. 3. Cardura 8 mg q.d. 4. Xanax 0.5 mg t.i.d. 5. Ecotrin 325 mg q.d. 6. Norvasc 5 mg p.o. q.d. 7. Glucotrol 10 mg p.o. q.d. 8. Sublingual nitroglycerin p.r.n.  ALLERGIES:  No known drug allergies.  Family history, social history, review of systems, and physical examination, please see the history and physical done at the time of admission.  HOSPITAL COURSE:  The patient was admitted and taken to the angiogram suite where he had a left lower extremity angiogram and was found to have occlusion of the left femoropopliteal bypass graft confirmed.  The radiologist began thrombolysis by infusion with the medication Retavase.  The effusion was to continue overnight, and a recatheterization was to be performed, and this was done on September 21, 2000, where the graft was found to be widely patent with posterior tibial and peroneal runoff and no evidence of stenosis.  The patient was then started on a course of Coumadin loading to attempt therapeutic INR prior to discharge.  In regard to the patients recovery, his left foot is warm and well perfused. He has brisk posterior tibial and Doppler signals on the left.  He has no evidence of hematoma in the right groin.  INR has been managed with daily adjustments of Coumadin.  His most recent INR is 1.3 on September 26, 2000.  It is also noted that the patient has been on heparin during this Coumadin loading, and this has been managed by the pharmacy.  Overall, the patients medical status is felt to be quite stable.  He is noted to be ambulating in the hall with stable vital signs and afebrile status.  He is felt to be stable for discharge at the time frame in which he becomes therapeutic on his Coumadin, possibly September 27, 2000.  FINAL DIAGNOSIS:  Occluded left femoropopliteal bypass graft.  Now status post Retavase thrombolysis  with successful opening of the graft.  OTHER DIAGNOSES PER PAST MEDICAL HISTORY:  1. Peripheral vascular occlusive disease.  2. Coronary artery disease.  3. Hypertension.  4. Peptic ulcer disease.  5. Chronic obstructive pulmonary disease.  6. Tobacco abuse.  7. Non-insulin dependent diabetes mellitus.  8. Lumbar disk disease.  9. Gout. 10. Peripheral neuropathy. 11. History of pancreatitis. 12. Osteoarthritis. 13. Impotence. 14. Previous surgeries as listed.  DISCHARGE MEDICATIONS:  As preoperatively, additionally:  Coumadin with dosage to be determined by pharmacy at discharge.  FOLLOW-UP:  Dr. Nita Sells office on September 19, 2000.  I discussed this with Dr. Nita Sells nurse, and they are aware that he is now on Coumadin and will be followed at the [facility].  Additional follow-up will include Dr. Edilia Bo on Wednesday, October 12, 2000, at 1 p.m. where they will repeat his arterial study.  CONDITION ON DISCHARGE:  Stable and improved.  DISCHARGE INSTRUCTIONS:  The patient received written instructions in regard to medications, activity, diet, wound care, and follow-up. DD:  09/26/00 TD:  09/26/00 Job: [id number]

## 2011-04-02 NOTE — Discharge Summary (Signed)
NAME:  [name]                           ACCOUNT NO.:  [id number]   MEDICAL RECORD NO.:  [id number]                   PATIENT TYPE:  INP   LOCATION:  3732                                 FACILITY:  [facility]   PHYSICIAN:  Ermalene Searing. Leander Rams, M.D.                DATE OF BIRTH:  08-11-1941   DATE OF ADMISSION:  12/07/2002  DATE OF DISCHARGE:  12/11/2002                                 DISCHARGE SUMMARY   DISCHARGE DIAGNOSES:  1. Symptomatic anemia, secondary to bleeding ulcer, resolved, stable.  2. Supra therapeutic INR on Coumadin, resolved.  3. Gastroesophageal reflux disease, stable.  4. Duodenal ulcer, stable.  5. Barrett's esophagus, stable, no inflammation present.  6. Hiatal hernia.  7. Tobacco abuse.  8. Coronary artery disease.  9. Arteriosclerotic cardiovascular disease.  10.      Carotid disease.  11.      History of a left carotid endarterectomy.  12.      Left aorta-femoral-popliteal bypass.  13.      Hypertension.  14.      Dyslipidemia.  15.      Hypertriglyceridemia.  16.      Diabetes mellitus type 2.  17.      Chronic low back pain, secondary to spinal stenosis.  18.      Erectile dysfunction.  19.      Prostatic hypertrophy.  20.      History of alcoholism, abstinent since 1993, with a history of an     acute gastrointestinal bleeding requiring hospitalization, with acute     pancreatitis.   DISCHARGE MEDICATIONS:  1. Tenormin 25 mg daily.  2. Actos 30 mg daily.  3. Protonix 40 mg b.i.d.  4. Zocor 40 mg daily.  5. Tri-Chlor 160 mg daily.  6. Plendil.  7. Hytrin 10 mg daily.  8. Glucotrol 10 mg daily.  9. MS Contin p.r.n.  10.      Foltx one daily.  11.      Pletal 50 mg daily.  12.      Wellbutrin SR 150 mg b.i.d.  13.      Testosterone shots.  14.      Viagra.  15.      Diovan 320 mg daily.    INSTRUCTIONS:  1. The patient is instructed not to take Aciphex, aspirin, ibuprofen, or     Naprosyn.  2. The patient is instructed to take no Coumadin  x2 weeks.  3. He is to see his primary care Tran Arzuaga prior to resuming his Coumadin.   ALLERGIES:  LOPRESSOR.   PROCEDURE:  An EGD performed by Dr. Wilhemina Bonito. [name] on December 10, 2002, and  tolerated without complications.   HISTORY OF PRESENT ILLNESS:  This 70 year old male smoker with chronic  medical problems, notably chronic anticoagulation due to severe peripheral  and carotid vascular disease and chronic NSAID analgesia due to spinal  radiculopathy and spinal stenosis.  He has been progressively declining over  the past 1 to 1-1/2 weeks, which he has attributed to the use of narcotic-  containing analgesics.  Mainly feeling increasingly weak, fatigued, and on  the day of admission it had reached a peak, and he became dizzy and his wife  insisted that he see a doctor because of his pale skin.  In the office on  the day of admission a hemoglobin of 6.9 was documented.  He was then sent  for hospitalization.  Unfortunately, we do not have a recent baseline  hemoglobin or entire outpatient record.  The patient is admitted for further  evaluation and treatment of symptomatic anemia.   HOSPITAL COURSE:  An electrocardiogram in the emergency room revealed a  normal sinus rhythm with no ST-T wave changes.  A chest x-ray revealed an enlarged heart but within normal limits.  There  was a focal ill-defined 1 cm opacity in the right mid-lung zone, likely  representing focal atelectasis or air space disease; however, a follow-up x-  ray is recommended.  Hemoglobin as noted above.  The patient was admitted to a telemetry bed for monitoring.  He was treated  with IV hydration and transfusion, for a total of 3 units of packed rbc's  during his admission.  The patient was maintained on as many of his  outpatient medications as was feasible at that time.  Coumadin was held,  secondary to suspected GI bleeding.  The patient was treated with vitamin K  for a supratherapeutic INR x1.  Also  prophylaxis was provided with Protonix.  Dr. Wilhemina Bonito. [name] of GI was consulted regarding evaluation of potential of  GI blood loss.  The patient underwent an EGD as noted above.  The findings  including Barrett's esophagus, a duodenal ulcer, and a hiatal hernia.  Dr.  Lamar Sprinkles recommendations included increasing his PPI to b.i.d., holding  anticoagulation for two to three weeks to get the INR therapeutic.  No  aspirin or non-steroidal anti-inflammatory drugs for an indefinite period of  time.  The patient's vital signs and hemoglobin had stabilized post-transfusion and  EGD.  The patient's somatic complaints were solved prior to discharge.  On  the day of discharge he feels well.  His vital signs include a temperature  of 99.1 degrees, blood pressure 126/58, pulse 64, room air saturation 96%.  His hemoglobin is 10.0, hematocrit 29.4, platelet count 261,000.   DISCHARGE LABORATORY DATA:  White blood cell count 3.5, hemoglobin 10.0,  hematocrit 29.4, platelet count 261,000.  PT 15.5, INR 1.2.  Sodium 139,  potassium 4.3, glucose 76, BUN 28, creatinine 1.7.  AST 32, ALT 15, ALP 32,  total bilirubin 0.5, magnesium 2.2.  CLO test was negative.   CONSULTATIONS:  Dr. Yancey Flemings of GI.   CONDITION ON DISCHARGE:  Good.   DISPOSITION:  Discharged to home.    FOLLOW UP:  Followup is to be with Dr. [name] [name]. Fried at [facility] in one week's time, for the monitoring of his CBC.       [name]. [name]. Leander Rams, M.D.    SMD/MEDQ  D:  02/26/2003  T:  02/26/2003  Job:  [id number]   cc:   [name] [name]. Foy Guadalajara, M.D.  [address] [place]  [place] [address]  Fax: [phone]  Yancey Flemings, M.D.

## 2011-04-02 NOTE — H&P (Signed)
NAMEDINNIS, ROG                           ACCOUNT NO.:  [id number]   MEDICAL RECORD NO.:  [id number]                   PATIENT TYPE:  INP   LOCATION:  2009                                 FACILITY:  [facility]   PHYSICIAN:  Quita Skye. Hart Rochester, M.D.               DATE OF BIRTH:  [birth year]   DATE OF ADMISSION:  12/24/2002  DATE OF DISCHARGE:                                HISTORY & PHYSICAL   REFERRING PHYSICIANS:  Dr. [name] L. Georgeanna Harrison, [place].  Dr. Pearletha Furl. Tharon Aquas.   CHIEF COMPLAINT:  Occluded left femoral to tibial peroneal trunk bypass  graft.   BRIEF HISTORY:  The patient is a 70 year old white male well known to [facility]  for multiple revascularizations of his left lower extremity.  This includes  a left femoral to below the knee popliteal bypass graft and vein graft, 6/99  by Dr. Edilia Bo.  He subsequently developed vein graft stenosis and underwent  Dacron patch angioplasty 6/00.  The stenosis reoccurred, and he ultimately  required a new left femoral to below-knee popliteal bypass graft with 6 mm  Gortex on 10/00.  In 12/01, the patient has a thrombectomy and revision of  his femoral popliteal graft to the distal aspect of the graft with extension  to the distal popliteal.  In January of 2002, he had a redo left common  femoral to tibial peroneal trunk bypass graft with 6 mm Gortex, and a tibial  peroneal trunk endarterectomy.  He has also undergone a left carotid  endarterectomy 7/02 by Dr. Hart Rochester.  He was readmitted to [facility]  with a biliary tract lead in January of 2004, and was discharged 12/11/02  after this was adequately controlled.  He returned to the hospital on  12/12/02 with his graft occluded.  He was subsequently readmitted and  underwent thrombectomy and patch of the distal anastomosis with Hemashield,  Dacron patch graft and intraoperative arteriogram on 12/12/02.  He was placed  on heparin postoperative, and had a slow  return of his INR on Coumadin.  During this time, he also developed some bradycardia.  He was seen by Dr.  Jacinto Halim for [facility], and his medications were altered.  He ultimately became therapeutic, and was discharged home on 12/20/02.  He was  discharged home on Coumadin 7.5 alternating with 5.0 mg q.o.d.  He returned  to see Dr. .Foy Guadalajara today, and INR's were reported as being 2.8 on his current  dose of Coumadin.  Shortly after returning home, his left foot became numb.  He has a similar experience the day before on Sunday, but he covered it up,  and it got warm again.  He did not feel there was a problem.  Today the foot  did not get warm after it occurred.  He ultimately called [facility] around 6:30  tonight, and was seen in the emergency room  where he was found to have an  occluded graft.  It was Dr. Candie Chroman opinion that he should be admitted and  undergo vein mapping for a possible redo left femoral tibial peroneal trunk  bypass graft using a vein from the right leg.  His INR is still 2.5.  His  creatinine is up to 2.  CBC is still pending.  We plan to correct his INR  with fresh frozen plasma tonight, and have him undergo vein mapping of the  right leg and the left arm in the a.m. to see if he is a candidate for left  femoral to peroneal bypass graft with vein.   PAST MEDICAL HISTORY:  1. Coronary artery disease with prior myocardial infarction and recent     medication adjustments for bradycardia.  2. Hypertension.  3. Peptic ulcer disease.  4. Chronic obstructive pulmonary disease with greater than 40 pack a year     history of tobacco use.  He quit after this last hospitalization.  He had     coagulopathy secondary to Coumadin and biliary tract bleed.  This was     corrected in the hospital.  He was discharged from [facility] on     12/11/02.  He has had back surgery in 1989, and cataract surgery in 1996.   ALLERGIES:  None known.    MEDICATIONS:  1.  Pindolol 10 mg p.o. b.i.d.  2. Niferex 150 mg every day  3. Actos 30 mg every day  4. Glucotrol 10 mg every day  5. Aciphex 20 mg every day  6. Hytrin 10 mg p.o. h.s.  7. Diovan 325 mg p.o. h.s.  8. Pravachol 80 mg every day  9. Coumadin 7.5 alternating with 5.0 q.i.d.  10.      Folic acid 1 mg every day  11.      Tricor 160 mg every day  12.      Plendil 5 mg every day   ALLERGIES:  None known.   SOCIAL HISTORY:  ETOH - none.   REVIEW OF SYSTEMS:  Negative.  He has had no problems until his foot got  cool this evening.   PHYSICAL EXAMINATION:  GENERAL:  A well-nourished, well-developed white  male, in no acute distress, slightly anemic in appearance.  HEENT:  Head normocephalic.  Eyes:  PERRLA.  EOM's intact.  Fundi not  visualized.  Ears, nose, throat and mouth:  Within normal limits.  NECK:  Supple, bilateral bruits.  HEART:  Normal S1 and S2, no murmurs, rubs or gallops.  ABDOMEN:  Soft, nontender, positive bowel sounds, no palpable organomegaly.  He has bilateral femoral/iliac bruits.  CHEST:  Clear to auscultation and percussion bilaterally.  GU/RECTAL:  Deferred.  EXTREMITIES:  Left foot is cool.  He has pain on ambulation.  He has  decreased sensation on the left.  Motion is intact.  Right foot is warm.  Motion and sensation are normal.  Pulses are +2 in the carotid, radial, and  brachial.  Femorals are +2.  I did not palpate any pulses distal to the  femorals bilaterally.  ABI's on 12/13/02 after his surgery showed index of  0.58 on the right, greater than 1 on the left.  Preoperative index was 0.62  on the right, and 0 on the left.  NEUROLOGIC:  No focal deficits.   IMPRESSION:  1. Recurrent occlusion on the left femoral to posterior tibial, bypass     graft, 6 mm Gortex on therapeutic Coumadin  dose.  2. Coronary artery disease, status post myocardial infarction, and     bradycardia, 1/04. 3. Chronic obstructive pulmonary disease recently quit after January 04      after hospitalization.  4. Coagulopathy with biliary tract bleed and anemia secondary to Coumadin     1/04.  5. History of peptic ulcer disease.  6. Mild renal insufficiency.    PLAN:  As noted above.  We plan to admit the patient, reverse his Coumadin.  He is to undergo vein mapping in his right leg and left arm and evaluate  whether he can be a candidate for redo left femoral to peroneal bypass graft  using vein.     Eber Hong, P.A.                 Quita Skye Hart Rochester, M.D.    WDJ/MEDQ  D:  12/24/2002  T:  12/24/2002  Job:  [id number]   cc:   Gerlene Burdock A. Alanda Amass, M.D.  [phone] [address]  [place]  [place] [address]  Fax: [phone]   Doris Cheadle. Foy Guadalajara, M.D.  [address] [place]  [place] [address]  Fax: [phone]

## 2011-04-02 NOTE — Discharge Summary (Signed)
NAMEBRAYTON, [name]                           ACCOUNT NO.:  [id number]   MEDICAL RECORD NO.:  [id number]                   PATIENT TYPE:  INP   LOCATION:  2039                                 FACILITY:  [facility]   PHYSICIAN:  Quita Skye. Hart Rochester, M.D.               DATE OF BIRTH:  1941-09-21   DATE OF ADMISSION:  12/24/2002  DATE OF DISCHARGE:  01/01/2003                                 DISCHARGE SUMMARY   PRIMARY ADMITTING DIAGNOSIS:  Occluded left femoral to tibial peroneal trunk  bypass graft.   ADDITIONAL DISCHARGE DIAGNOSES:  1. Occluded left femoral to tibial peroneal trunk bypass graft.  2. Peripheral vascular disease with ischemic left foot.  3. Coronary artery disease with history of previous myocardial infarction.  4. History of bradycardia.  5. Hypertension.  6. Peptic ulcer disease.  7. Mild renal insufficiency.  8. Chronic obstructive pulmonary disease.  9. Status post recent hospitalization for coagulopathy with biliary tract     bleed and anemia secondary to Coumadin therapy in 1/04.   PROCEDURES PERFORMED:  1. Re-do left femoral to peroneal bypass in a nonreverse translocated     saphenous vein graft of the right leg.  2. Intraoperative arteriogram.   HISTORY:  The patient is a 70 year old white male who has a long history of  peripheral vascular disease.  He is most recently status post a left femoral  to tibial peroneal trunk bypass graft with 6 mm Gor-Tex in January of 2002.  Since that time, he has had occlusion of the graft on 12/12/02.  At that  time, he underwent a thrombectomy as well as patch angioplasty of the distal  anastomosis with a Hemashield graft.  He was restarted on Coumadin and was  discharged home on 12/20/02.  Shortly after he returned home, his foot became  numb.  It also started to feel somewhat cool.  This continued over several  days, and ultimately he called the CVTS office on 12/24/02 and was referred to  the emergency department for further  evaluation.  In the ER, he was noted to  have occlusion of his graft.  He is admitted for further evaluation and  treatment.   HOSPITAL COURSE:  Upon admission, his INR was 2.5, and his creatinine was 2.  It was felt that he would most likely need a re-do revascularization  procedure, but because of his therapeutic INR, he was given fresh-frozen  plasma to correct his coagulopathy.  He also underwent Doppler studies and  vein mapping to make sure he had adequate conduit to proceed with redo  bypass procedure.  He was started on heparin.  By 12/27/02, his INR had  returned to normal, and he was able to be taken to the operating room where  he underwent left femoral to peroneal bypass with saphenous vein from the  right leg.  He tolerated the procedure well, and was transferred  to the  floor in stable condition.  He was restarted on heparin and Coumadin.  Postoperatively, he has done well.  He has gradually been mobilized, and  presently is ambulating without difficulty.  His graft has remained patent  with a strong peroneal as well as popliteal graft pulses.  His INR today is  1.9, PT is 20.4.  His heparin has been discontinued.  His surgical incision  sites are healing well.  Postoperative ankle-brachial indices are 0.88 on  the left and 0.60 on the right.  He has remained afebrile, and all vital  signs have been stable throughout his admission.  He is tolerating a regular  diet.  He is having normal bowel and bladder function.  It is felt that if  he continues to remain stable, and his INR is therapeutic, on the morning of  01/01/03, he will be ready for discharge home.   DISCHARGE MEDICATIONS:  1. Coumadin alternating 5 mg and 7.5 mg every other day.  2. Tylox 1-2 q.4h. p.r.n. for pain.  3. Pindolol 10 mg b.i.d.  4. Actos 30 mg every day  5. Glucotrol 10 mg every day  6. Diovan 320 mg every day  7. Aciphex 20 mg every day  8. Hytrin 10 mg every day  9. Plendil 5 mg every day  10.       Tricor 160 mg every day  11.      Pravachol 80 mg every day  12.      Folic acid, 1 mg every day  13.      Niferex 150 mg every day   DISCHARGE INSTRUCTIONS:  1. Please refrain from driving, heavy lifting or strenuous activity.  2. He may continue his preoperative diet.  3. He is to shower daily and clean his incisions with soap and water.   DISCHARGE FOLLOWUP:  1. He will see Dr. Hart Rochester in the office on Tuesday, January 22, 2003, at 11     a.m.  2. He will return to the CVTS office in one week for staple removal and     wound check by the nurse.  3. He is asked to have PT and INR drawn at Dr. Delrae Alfred office on Monday to     check his Coumadin and to adjust his dose appropriately.  4. He should call our office if he experiences any redness, swelling, or     increased drainage from the incision site, fever greater than 101,     increased numbness, pallor, or temperature change or any other signs of     ischemia of the operative extremity.     Coral Ceo, P.A.                        Quita Skye Hart Rochester, M.D.    GC/MEDQ  D:  12/31/2002  T:  12/31/2002  Job:  [id number]   cc:   [name] L. Foy Guadalajara, M.D.  [address] [place]  [place] [address]  Fax: [phone]   [name], M.D.  [phone] [address], [address]  [place]  [place] [address]  Fax: [phone]

## 2011-04-02 NOTE — Op Note (Signed)
[place]. [facility]  Patient:    [name], [name]                          MRN: [id number] Proc. Date: 11/18/00 Attending:  Quita Skye. Hart Rochester, M.D.                           Operative Report  PREOPERATIVE DIAGNOSIS: Thrombosed left femoropopliteal bypass with ischemic left leg.  POSTOPERATIVE DIAGNOSIS: Thrombosed left femoropopliteal bypass with ischemia left leg.  OPERATION/PROCEDURE: Redo left common femoral to tibioperoneal trunk bypass, with tibioperoneal trunk endarterectomy using a 6 mm Gore-Tex graft, plus intraoperative arteriogram and thrombectomy of left posterior tibial and peroneal arteries.  SURGEON: Quita Skye. Hart Rochester, M.D.  ASSISTANT: Maxwell Marion, RNFA  ANESTHESIA: General endotracheal anesthesia.  DESCRIPTION OF PROCEDURE: The patient was taken to the operating room and placed on the operating room table in the supine position, at which time satisfactory general endotracheal anesthesia was administered.  Both lower extremities were prepped with Betadine scrub and solution and draped in routine sterile manner.  The previous incision in the left inguinal region and below the knee medially were reopened, the patient having previously undergone surgery about ten days earlier.  The distal anastomosis of Gore-Tex to below-the-knee popliteal artery was exposed and the tibioperoneal trunk dissected free throughout its entire course, encircling the posterior tibial and peroneal arteries with vessel loops.  There was diffuse disease particularly in the distal aspect of the tibioperoneal trunk.  The posterior tibial artery was exposed for about 2-3 cm.  The inguinal incision was also opened and proximal control at the inguinal ligament obtained of the distal external iliac or proximal common femoral artery.  The Gore-Tex anastomosis was exposed to the common femoral artery.  A new tunnel was created and a 6 mm PTFE Gore-Tex graft delivered through  it.  The patient was heparinized.  The distal anastomosis of the popliteal artery was removed from the popliteal artery below the knee and there was thrombus which appeared to be several days old, with some fresh component in this vessel.  It was thrombectomized under direct vision and this arteriotomy reclosed with a continuous 6-0 Prolene suture.  A new arteriotomy was then made distal to this in the tibioperoneal trunk and extended down to and through the origin of the posterior tibial artery extending out about 2 cm.  The posterior tibial and peroneal arteries would each accept a 2 mm dilator.  The posterior tibial was the more diseased vessel.  An endarterectomy of the tibioperoneal trunk was performed, removing some calcified plaque, which improved the inflow to the peroneal artery significantly.  A Fogarty catheter had been passed down both vessels and there was thrombus particularly near the origin of the vessels on both sides, with excellent backbleeding after the thrombectomy noted.  New Gore-Tex graft was spatulated and anastomosed end-to-side using continuous 6-0 Prolene. Following this attention was then turned to the groin, where a Cooley clamp was used as a partial occlusion clamp in the Gore-Tex to common femoral anastomosis and a segment of the old Gore-Tex was excised.  New end-to-side anastomosis was done with the new Gore-Tex with 6-0 Prolene.  The clamp was then released and there was an excellent pulse in the graft and good Doppler flow in the posterior tibial and peroneal arteries within the wound and also audible at the ankle.  The peroneal was seen to be  the best vessel. Intraoperative arteriogram revealed a patent distal anastomosis with runoff through severely diseased anterior tibial and posterior tibial arteries, the best vessel definitely being the peroneal artery - which was patent to the distal leg.  Protamine was given to reverse the heparin and following  adequate hemostasis a 10 flat Jackson-Pratt drain was brought out through an inferiorly based stab wound adjacent to both incisions, and the wounds were closed in layers with Vicryl and clips.  A sterile dressing was applied.  The patient was taken to the recovery room in satisfactory condition. DD:  11/18/00 TD:  11/19/00 Job: [id number]

## 2011-04-02 NOTE — Discharge Summary (Signed)
[place]. [facility]  Patient:    [name], [name]                        MRN: [id number] Adm. Date:  [id number] Disc. Date: 11/14/00 Attending:  Colvin Caroli Dictator:   Adair Patter, P.A. CC:         Teena Irani. Arlyce Dice, M.D.   Discharge Summary  DATE OF BIRTH:  1941/05/28  ADMISSION DIAGNOSIS:  Thrombosed left femoral popliteal bypass.  SECONDARY DIAGNOSES: 1. Tobacco use. 2. Type 2 diabetes mellitus. 3. Peripheral vascular disease. 4. Coronary artery disease. 5. Hypertension. 6. Peptic ulcer disease. 7. Chronic obstructive pulmonary disease. 8. Peripheral neuropathy.  DISCHARGE DIAGNOSIS:  Status post thrombectomy and revision of left fem-pop bypass graft.  PROCEDURES: 1. Lower extremity venous mapping. 2. Thrombectomy and revision of left femoral popliteal bypass graft.  HOSPITAL COURSE:  The patient was admitted to [facility] on November 05, 2000, secondary to ischemic left foot.  This was secondary to thrombosed femoropopliteal bypass graft.  On November 07, 2000, the patient was taken to the operating room and underwent a thrombectomy revision of this femoropopliteal bypass graft.  In addition, he also underwent exploration of the distal femoropopliteal bypass anastomosis and a popliteal to distal popliteal bypass with Gore-Tex graft. Intraoperative arteriogram times three was also performed at this time.  No complications were noted during the procedure.  Postoperatively the patient was anticoagulated with heparin and Coumadin.  His postoperative course was uneventful, and he was discharged home on November 14, 2000, when his INR was therapeutic.  DISCHARGE MEDICATIONS: 1. Tylox one to two tablets q.4-6h. as needed for pain. 2. Norvasc 5 mg one tablet daily. 3. Glucotrol 10 mg one tablet daily. 4. Prilosec 20 mg in the a.m. and 40 mg in the p.m. 5. Xanax 3.5 mg one tablet three times daily. 6. Sublingual  nitroglycerin to be taken as needed. 7. Nortriptyline 15 mg one tablet daily. 8. Coumadin.  The patient will most likely be sent home on 5 to 7.5 mg of    Coumadin which will be further taken as directed by his INR.  DISCHARGE ACTIVITY:  The patient is warned to avoid driving and strenuous activity while taking his pain medications.  DISCHARGE DIET:  No concentrated sweets.  WOUND CARE:  The patient was told he may shower and clean his incision with mild soap and water.  DISPOSITION:  Home.  FOLLOW-UP APPOINTMENT:  The patient was instructed to follow up with Dr. Hart Rochester CVTS office in two weeks.  He was told he is also to call and verify time and date of his appointment.  He was also told to continue to go to [facility] to have his INR followed which will further dictate his Coumadin therapy. DD:  11/13/00 TD:  11/13/00 Job: [id number]

## 2011-04-02 NOTE — Discharge Summary (Signed)
[name], [name]                           ACCOUNT NO.:  [id number]   MEDICAL RECORD NO.:  [id number]                   PATIENT TYPE:  INP   LOCATION:  3739                                 FACILITY:  [facility]   PHYSICIAN:  Nanetta Batty, M.D.                DATE OF BIRTH:  04-10-1941   DATE OF ADMISSION:  12/25/2003  DATE OF DISCHARGE:  01/01/2004                                 DISCHARGE SUMMARY   HOSPITAL COURSE:  Mr. Mancil was seen in the office on December 25, 2003 by  referral of Dr. Foy Guadalajara because he was in atrial fibrillation.  He was seen by  Nada Boozer and Dr. Nanetta Batty in the hospital.  He was found to be in  atrial fibrillation with hypotension.  He also felt weak and tired.  He has  a history in the past of GI bleed.  It was decided to admit him, have GI see  him, check his blood counts, etc.  Thus he came in to the hospital, he was  put on IV Cardizem.  His Coumadin was continued.  GI saw him, felt that he  should undergo EGD and colonoscopy to rule out a bleed with his anemia.  He  did get 2 units of packed red blood cells.  EGD was eventually done.  He was  not found to have any source of bleeding.  He also underwent TEE  cardioversion on December 27, 2003 which was successful.  He had been put on  IV heparin while his Coumadin was on hold.  After his EGD and colonoscopy,  his Coumadin was restarted but it was decided that he could go home on  Lovenox.  GI also recommended that he have a heme consult because there was  no source of GI bleeding to be found.  He was seen by Dr. Nanetta Batty on  January 01, 2004.  It was decided that he could go home on Lovenox to  Coumadin.  Case manager was called.  He was taught how to give himself an  injection and he was discharged home.   LABORATORY DATA:  The day of discharge his hemoglobin was 7.9, hematocrit  31.7, platelets were 209 and WBCs were 8.  He was also checked for folate,  ferritin, B12 and TSH.  They were all  within normal limits.  His sodium was  141, potassium 3.7, BUN 21, creatinine 1.7.  Admission hemoglobin was 8.1,  hematocrit 23.2.  Stool guaiacs were negative x4.  TSH was 1.738.  CK's were  elevated but troponins were negative.  Iron was 42, TIBC was 385, percent  saturation was 11, RBC folate was 937, ferritin was 85, B12 was 316.  I do  not see a chest x-ray on the chart at the time of this dictation.   DISCHARGE MEDICATIONS:  1. Protonix 40 mg once a day.  2. Lovenox 90 mg  x6 doses.  3. Coumadin 7.5 mg tablets 1/2 tablet every day.  4. Pindolol 10 mg twice per day.  5. Simvastatin 80 mg once a day.  6. Hytrin 10 mg once per day.  7. Actos 30 mg once per day.  8. Tricor 145 mg once per day.  9. Ferrex 150 mg twice per day x1 month, then once a day.  10.      Glucotrol 10 mg once per day.  11.      Felodipine 5 mg once per day.  12.      Irbesartan 300 mg once per day.  13.      Folic acid and B6 as taken before.  14.      Pentoxifylline 400 mg three times a day.   To note, he does [facility] the [facility] and many of these medications  have been substituted as far as what have been ordered originally.  He  should have his PT/INR and CBC drawn on Monday.  He will follow up with Dr.  Alanda Amass on March 11th at 4 p.m.  He will follow up on February 25th at  9:30 with Corine Shelter in Dr. Kandis Cocking office.   DISCHARGE DIAGNOSES:  1. Anemia of questionable etiology.  Gastrointestinal is now suggesting a     heme consult.  His EGD and colonoscopy were negative for any source of     bleed.  He does have a prior history of a gastrointestinal bleed.  2. Paroxysmal atrial fibrillation.  He did undergo transesophageal     echocardiography cardioversion in the hospital which was successful.  He     does have a history of bradycardia on beta-blockers, thus rate control is     a difficult issue.  He is on pindolol and has been on this long-term.  3. Arteriosclerotic peripheral  vascular disease with, I believe, history of     thrombus in the past and that is why he has been on Coumadin in the past     on a long-term basis which has always been followed by Dr. Foy Guadalajara.  He was     discharged on home on Lovenox to Coumadin.  He was in sinus rhythm at the     time and it is decided that he should have his pro time checked and sent     to our office the first one or two times and then he may follow up with     Dr. Foy Guadalajara.  4. Hypertension.  5. Adult-onset diabetes mellitus.  6. Small patent foramen ovale by transesophageal echocardiography Doppler.  7. History of Barrett's esophagus.      Lezlie Octave, N.P.                        Nanetta Batty, M.D.    BB/MEDQ  D:  01/03/2004  T:  01/04/2004  Job:  [id number]   cc:   [name] L. Foy Guadalajara, M.D.  [address] [place]  [place] [address]  Fax: [phone]   Dr.  [name]

## 2011-04-29 ENCOUNTER — Encounter (INDEPENDENT_AMBULATORY_CARE_PROVIDER_SITE_OTHER): Payer: Medicare Other

## 2011-04-29 DIAGNOSIS — I739 Peripheral vascular disease, unspecified: Secondary | ICD-10-CM

## 2011-04-29 DIAGNOSIS — Z48812 Encounter for surgical aftercare following surgery on the circulatory system: Secondary | ICD-10-CM

## 2011-05-04 NOTE — Procedures (Unsigned)
BYPASS GRAFT EVALUATION  INDICATION:  Follow up bypass graft.  HISTORY: Diabetes:  Yes. Cardiac:  Yes. Hypertension:  Yes. Smoking:  Yes. Previous Surgery:  Left femoral to posterior tibial artery bypass graft with composite cephalic vein on 04/54/0981 by Dr. Hart Rochester.  SINGLE LEVEL ARTERIAL EXAM                              RIGHT              LEFT Brachial:                    161                158 Anterior tibial:             124                150 Posterior tibial:            115                148 Peroneal: Ankle/brachial index:        0.77               0.93  PREVIOUS ABI:  Date: 10/22/2010  RIGHT:  0.62  LEFT:  0.88  LOWER EXTREMITY BYPASS GRAFT DUPLEX EXAM:  DUPLEX:  Patent femoral to posterior tibial artery bypass graft with velocity measurements noted on the following worksheet.  IMPRESSION: 1. Stable ankle brachial indices bilaterally. 2. Patent femoral to posterior tibial artery bypass graft.      ___________________________________________ Quita Skye. Hart Rochester, M.D.  EM/MEDQ  D:  04/29/2011  T:  04/29/2011  Job:  [id number]

## 2011-06-15 ENCOUNTER — Other Ambulatory Visit: Payer: Self-pay | Admitting: Dermatology

## 2011-08-27 LAB — BASIC METABOLIC PANEL
CO2: 23
Chloride: 112
Creatinine, Ser: 1.45
Sodium: 144

## 2011-08-27 LAB — CBC
HCT: 32.9 — ABNORMAL LOW
MCV: 88
Platelets: 220
RBC: 3.74 — ABNORMAL LOW
WBC: 8

## 2011-08-27 LAB — OCCULT BLOOD X 1 CARD TO LAB, STOOL: Fecal Occult Bld: POSITIVE

## 2011-08-27 LAB — PROTIME-INR: Prothrombin Time: 16 — ABNORMAL HIGH

## 2011-08-30 LAB — CBC
Hemoglobin: 7.1 — CL
MCHC: 34.5
MCHC: 34.7
MCV: 88.7
MCV: 88.9
MCV: 91.6
Platelets: 185
Platelets: 187
Platelets: 217
RBC: 2.18 — ABNORMAL LOW
RBC: 3.12 — ABNORMAL LOW
WBC: 7.2
WBC: 8.5
WBC: 9

## 2011-08-30 LAB — URINALYSIS, ROUTINE W REFLEX MICROSCOPIC
Bilirubin Urine: NEGATIVE
Ketones, ur: NEGATIVE
Nitrite: NEGATIVE
Protein, ur: NEGATIVE
Urobilinogen, UA: 1

## 2011-08-30 LAB — BASIC METABOLIC PANEL
BUN: 27 — ABNORMAL HIGH
BUN: 51 — ABNORMAL HIGH
CO2: 23
Calcium: 9.2
Calcium: 9.3
Calcium: 9.4
Chloride: 110
Creatinine, Ser: 1.43
Creatinine, Ser: 1.94 — ABNORMAL HIGH
Creatinine, Ser: 2.06 — ABNORMAL HIGH
GFR calc Af Amer: 42 — ABNORMAL LOW
GFR calc non Af Amer: 24 — ABNORMAL LOW
GFR calc non Af Amer: 32 — ABNORMAL LOW
GFR calc non Af Amer: 49 — ABNORMAL LOW
Glucose, Bld: 105 — ABNORMAL HIGH
Glucose, Bld: 117 — ABNORMAL HIGH
Potassium: 5
Sodium: 137

## 2011-08-30 LAB — HEPATIC FUNCTION PANEL
ALT: 23
AST: 35
Albumin: 3.2 — ABNORMAL LOW
Alkaline Phosphatase: 24 — ABNORMAL LOW
Total Bilirubin: 0.6
Total Protein: 6

## 2011-08-30 LAB — CROSSMATCH
ABO/RH(D): A POS
Antibody Screen: NEGATIVE
Donor AG Type: NEGATIVE
Donor AG Type: NEGATIVE
Donor AG Type: NEGATIVE
PT AG Type: NEGATIVE

## 2011-08-30 LAB — PROTEIN ELECTROPH W RFLX QUANT IMMUNOGLOBULINS
Albumin ELP: 60.7
Alpha-1-Globulin: 5.1 — ABNORMAL HIGH

## 2011-08-30 LAB — URINE CULTURE
Culture: NO GROWTH
Special Requests: NEGATIVE

## 2011-08-30 LAB — PREPARE FRESH FROZEN PLASMA

## 2011-08-30 LAB — HAPTOGLOBIN: Haptoglobin: 183

## 2011-08-30 LAB — RETICULOCYTES: Retic Count, Absolute: 100.8

## 2011-08-30 LAB — TECHNOLOGIST SMEAR REVIEW

## 2011-08-30 LAB — ANTIBODY SCREEN: Antibody Screen: NEGATIVE

## 2011-08-30 LAB — PROTIME-INR
INR: 1.6 — ABNORMAL HIGH
Prothrombin Time: 19.4 — ABNORMAL HIGH

## 2011-08-30 LAB — ERYTHROPOIETIN: Erythropoietin: 10.2

## 2011-08-30 LAB — APTT: aPTT: 45 — ABNORMAL HIGH

## 2011-08-30 LAB — HEMOGLOBIN AND HEMATOCRIT, BLOOD: Hemoglobin: 8.9 — ABNORMAL LOW

## 2011-08-30 LAB — VITAMIN B12: Vitamin B-12: 412 (ref 211–911)

## 2012-01-25 ENCOUNTER — Other Ambulatory Visit (HOSPITAL_BASED_OUTPATIENT_CLINIC_OR_DEPARTMENT_OTHER): Payer: Self-pay | Admitting: Family Medicine

## 2012-01-25 DIAGNOSIS — R109 Unspecified abdominal pain: Secondary | ICD-10-CM

## 2012-01-26 ENCOUNTER — Ambulatory Visit (HOSPITAL_BASED_OUTPATIENT_CLINIC_OR_DEPARTMENT_OTHER)
Admission: RE | Admit: 2012-01-26 | Discharge: 2012-01-26 | Disposition: A | Discharge status: Home / Self Care | Payer: Medicare Other | Source: Ambulatory Visit | Attending: Family Medicine | Admitting: Family Medicine

## 2012-01-26 DIAGNOSIS — K8020 Calculus of gallbladder without cholecystitis without obstruction: Secondary | ICD-10-CM | POA: Insufficient documentation

## 2012-01-26 DIAGNOSIS — R109 Unspecified abdominal pain: Secondary | ICD-10-CM

## 2012-01-26 DIAGNOSIS — N200 Calculus of kidney: Secondary | ICD-10-CM | POA: Insufficient documentation

## 2012-01-26 DIAGNOSIS — M549 Dorsalgia, unspecified: Secondary | ICD-10-CM

## 2012-01-26 DIAGNOSIS — R1031 Right lower quadrant pain: Secondary | ICD-10-CM | POA: Insufficient documentation

## 2012-04-20 ENCOUNTER — Encounter: Payer: Self-pay | Admitting: Vascular Surgery

## 2012-05-02 ENCOUNTER — Ambulatory Visit: Payer: Medicare Other | Admitting: Vascular Surgery

## 2012-05-02 ENCOUNTER — Encounter: Payer: Self-pay | Admitting: Neurosurgery

## 2012-05-03 ENCOUNTER — Ambulatory Visit (INDEPENDENT_AMBULATORY_CARE_PROVIDER_SITE_OTHER): Payer: Medicare Other | Admitting: *Deleted

## 2012-05-03 ENCOUNTER — Ambulatory Visit (INDEPENDENT_AMBULATORY_CARE_PROVIDER_SITE_OTHER): Payer: Medicare Other | Admitting: Neurosurgery

## 2012-05-03 ENCOUNTER — Encounter: Payer: Self-pay | Admitting: Neurosurgery

## 2012-05-03 ENCOUNTER — Encounter (INDEPENDENT_AMBULATORY_CARE_PROVIDER_SITE_OTHER): Payer: Medicare Other | Admitting: *Deleted

## 2012-05-03 VITALS — BP 136/62 | HR 69 | Resp 16 | Ht 69.5 in | Wt 172.2 lb

## 2012-05-03 DIAGNOSIS — I70229 Atherosclerosis of native arteries of extremities with rest pain, unspecified extremity: Secondary | ICD-10-CM | POA: Insufficient documentation

## 2012-05-03 DIAGNOSIS — Z48812 Encounter for surgical aftercare following surgery on the circulatory system: Secondary | ICD-10-CM

## 2012-05-03 NOTE — Progress Notes (Signed)
[facility] Office Note  CC: Annual bypass graft evaluation ABIs Referring Physician: Hart Rochester  History of Present Illness: 71 year old male patient of Dr. Hart Rochester that underwent a left lower extremity bypass graft in 2007 which was a femoral to posterior tibial distal at the ankle level bypass. The patient denies any signs or symptoms claudication, rest pain and has no open wounds on his lower extremities.  Past Medical History  Diagnosis Date  . Anemia   . Arthritis   . Hypertension   . Diabetes mellitus   . Hyperlipidemia   . Carotid artery occlusion   . GERD (gastroesophageal reflux disease)   . Anxiety     ROS: [x]  Positive   [ ]  Denies    General: [ ]  Weight loss, [ ]  Fever, [ ]  chills Neurologic: [ ]  Dizziness, [ ]  Blackouts, [ ]  Seizure [ ]  Stroke, [ ]  "Mini stroke", [ ]  Slurred speech, [ ]  Temporary blindness; [ ]  weakness in arms or legs, [ ]  Hoarseness Cardiac: [ ]  Chest pain/pressure, [ ]  Shortness of breath at rest [ ]  Shortness of breath with exertion, [ ]  Atrial fibrillation or irregular heartbeat Vascular: [ ]  Pain in legs with walking, [ ]  Pain in legs at rest, [ ]  Pain in legs at night,  [ ]  Non-healing ulcer, [ ]  Blood clot in vein/DVT,   Pulmonary: [ ]  Home oxygen, [ ]  Productive cough, [ ]  Coughing up blood, [ ]  Asthma,  [ ]  Wheezing Musculoskeletal:  [ ]  Arthritis, [ ]  Low back pain, [ ]  Joint pain Hematologic: [ ]  Easy Bruising, [ ]  Anemia; [ ]  Hepatitis Gastrointestinal: [ ]  Blood in stool, [ ]  Gastroesophageal Reflux/heartburn, [ ]  Trouble swallowing Urinary: [ ]  chronic Kidney disease, [ ]  on HD - [ ]  MWF or [ ]  TTHS, [ ]  Burning with urination, [ ]  Difficulty urinating Skin: [ ]  Rashes, [ ]  Wounds Psychological: [ ]  Anxiety, [ ]  Depression   Social History History  Substance Use Topics  . Smoking status: Current Everyday Smoker -- 0.5 packs/day  . Smokeless tobacco: Not on file  . Alcohol Use: No    Family  History Family History  Problem Relation Age of Onset  . Heart disease Father     Allergies  Allergen Reactions  . Lopressor (Metoprolol Tartrate) Palpitations    Current Outpatient Prescriptions  Medication Sig Dispense Refill  . ALPRAZolam (XANAX) 0.5 MG tablet Take 0.5 mg by mouth at bedtime as needed.      Marland Kitchen amLODipine (NORVASC) 5 MG tablet Take 5 mg by mouth daily.      Marland Kitchen aspirin 81 MG tablet Take 81 mg by mouth daily.      . cilostazol (PLETAL) 100 MG tablet Take 100 mg by mouth 2 (two) times daily.      . cloNIDine (CATAPRES) 0.1 MG tablet Take 0.1 mg by mouth 2 (two) times daily.      Marland Kitchen docusate sodium (COLACE) 100 MG capsule Take 100 mg by mouth 2 (two) times daily.      . fenofibrate 160 MG tablet Take 160 mg by mouth daily.      Marland Kitchen glipiZIDE (GLUCOTROL) 5 MG tablet Take 5 mg by mouth 2 (two) times daily before a meal.      . metoprolol succinate (TOPROL-XL) 25 MG 24 hr tablet Take 25 mg by mouth daily.      . metoprolol tartrate (LOPRESSOR) 25 MG tablet Take 25 mg by mouth  as needed. One am an one half at bedtime      . omega-3 acid ethyl esters (LOVAZA) 1 G capsule Take 1 g by mouth 2 (two) times daily.      Marland Kitchen oxycodone (ROXICODONE) 30 MG immediate release tablet Take 30 mg by mouth every 8 (eight) hours as needed.      . pantoprazole (PROTONIX) 40 MG tablet Take 40 mg by mouth daily.      . pioglitazone (ACTOS) 30 MG tablet Take 30 mg by mouth daily.      . propafenone (RYTHMOL) 225 MG tablet Take 225 mg by mouth every 8 (eight) hours.      . quiNINE (QUALAQUIN) 324 MG capsule Take 648 mg by mouth every 8 (eight) hours.      . simvastatin (ZOCOR) 80 MG tablet Take 80 mg by mouth at bedtime.      Marland Kitchen terazosin (HYTRIN) 10 MG capsule Take 10 mg by mouth at bedtime.      Marland Kitchen testosterone cypionate (DEPOTESTOTERONE CYPIONATE) 200 MG/ML injection Inject 200 mg into the skin Every month.      . warfarin (COUMADIN) 3 MG tablet Take 4 mg by mouth daily.       . IRBESARTAN PO Take by  mouth.        Physical Examination  Filed Vitals:   05/03/12 1451  BP: 136/62  Pulse: 69  Resp: 16    Body mass index is 25.06 kg/(m^2).  General:  WDWN in NAD Gait: Normal HEENT: WNL Eyes: Pupils equal Pulmonary: normal non-labored breathing , without Rales, rhonchi,  wheezing Cardiac: RRR, without  Murmurs, rubs or gallops; No carotid bruits Abdomen: soft, NT, no masses Skin: no rashes, ulcers noted Vascular Exam/Pulses: 2+ radial pulses bilaterally, lower extremity pulses are palpable  Extremities without ischemic changes, no Gangrene , no cellulitis; no open wounds;  Musculoskeletal: no muscle wasting or atrophy  Neurologic: A&O X 3; Appropriate Affect ; SENSATION: normal; MOTOR FUNCTION:  moving all extremities equally. Speech is fluent/normal  Non-Invasive Vascular Imaging: Bypass graft evaluation shows ABIs today are 1.34 on the right, and 0.97 on the left which are somewhat improved from previous year.  ASSESSMENT/PLAN: Asymptomatic patient that is 6 years status post lower 70 bypass graft and doing well. The patient return in one year for repeat studies. His questions were encouraged and answered, he is in agreement with this plan.  Lauree Chandler ANP  Clinic M.D.: Edilia Bo

## 2012-05-09 NOTE — Procedures (Unsigned)
BYPASS GRAFT EVALUATION  INDICATION:  Left lower extremity bypass graft.  HISTORY: Diabetes:  Yes. Cardiac:  Yes. Hypertension:  Yes. Smoking:  Yes. Previous Surgery:  Left femoral-to-posterior tibial artery bypass graft on 05/11/2006.  SINGLE LEVEL ARTERIAL EXAM                              RIGHT              LEFT Brachial: Anterior tibial: Posterior tibial: Peroneal: Ankle/brachial index:  PREVIOUS ABI:  Date:  RIGHT:  LEFT:  LOWER EXTREMITY BYPASS GRAFT DUPLEX EXAM:  DUPLEX:  Biphasic/monophasic Doppler waveforms noted throughout the left lower extremity bypass graft with no increase in velocities.  IMPRESSION: 1. Patent left lower extremity bypass graft with no evidence of     stenosis. 2. Bilateral ankle brachial indices are noted on a separate report.  ___________________________________________ Quita Skye. Hart Rochester, M.D.  CH/MEDQ  D:  05/04/2012  T:  05/04/2012  Job:  [id number]

## 2012-10-30 ENCOUNTER — Other Ambulatory Visit (HOSPITAL_COMMUNITY): Payer: Self-pay | Admitting: Cardiovascular Disease

## 2012-10-30 DIAGNOSIS — I6529 Occlusion and stenosis of unspecified carotid artery: Secondary | ICD-10-CM

## 2013-02-02 ENCOUNTER — Encounter (HOSPITAL_COMMUNITY): Payer: Medicare Other

## 2013-04-30 ENCOUNTER — Encounter: Payer: Self-pay | Admitting: Physician Assistant

## 2013-04-30 ENCOUNTER — Encounter: Payer: Self-pay | Admitting: Cardiovascular Disease

## 2013-05-01 ENCOUNTER — Ambulatory Visit (INDEPENDENT_AMBULATORY_CARE_PROVIDER_SITE_OTHER): Payer: Medicare Other | Admitting: Cardiovascular Disease

## 2013-05-01 VITALS — BP 142/70 | HR 63 | Ht 69.5 in | Wt 171.0 lb

## 2013-05-01 DIAGNOSIS — I4891 Unspecified atrial fibrillation: Secondary | ICD-10-CM

## 2013-05-01 DIAGNOSIS — E785 Hyperlipidemia, unspecified: Secondary | ICD-10-CM

## 2013-05-01 DIAGNOSIS — I119 Hypertensive heart disease without heart failure: Secondary | ICD-10-CM

## 2013-05-01 DIAGNOSIS — Z7901 Long term (current) use of anticoagulants: Secondary | ICD-10-CM

## 2013-05-01 DIAGNOSIS — Z5181 Encounter for therapeutic drug level monitoring: Secondary | ICD-10-CM

## 2013-05-01 NOTE — Patient Instructions (Signed)
Your physician recommends that you schedule a follow-up appointment in: 6 months.  No changes have been made in your treatment today.

## 2013-05-02 ENCOUNTER — Other Ambulatory Visit: Payer: Self-pay

## 2013-05-02 DIAGNOSIS — I739 Peripheral vascular disease, unspecified: Secondary | ICD-10-CM

## 2013-05-02 DIAGNOSIS — Z48812 Encounter for surgical aftercare following surgery on the circulatory system: Secondary | ICD-10-CM

## 2013-05-03 ENCOUNTER — Encounter (INDEPENDENT_AMBULATORY_CARE_PROVIDER_SITE_OTHER): Payer: Medicare Other | Admitting: *Deleted

## 2013-05-03 ENCOUNTER — Ambulatory Visit: Payer: Medicare Other | Admitting: Neurosurgery

## 2013-05-03 ENCOUNTER — Encounter: Payer: Self-pay | Admitting: Cardiovascular Disease

## 2013-05-03 DIAGNOSIS — Z7901 Long term (current) use of anticoagulants: Secondary | ICD-10-CM | POA: Insufficient documentation

## 2013-05-03 DIAGNOSIS — I119 Hypertensive heart disease without heart failure: Secondary | ICD-10-CM | POA: Insufficient documentation

## 2013-05-03 DIAGNOSIS — E785 Hyperlipidemia, unspecified: Secondary | ICD-10-CM | POA: Insufficient documentation

## 2013-05-03 DIAGNOSIS — Z48812 Encounter for surgical aftercare following surgery on the circulatory system: Secondary | ICD-10-CM

## 2013-05-03 DIAGNOSIS — I739 Peripheral vascular disease, unspecified: Secondary | ICD-10-CM

## 2013-05-03 DIAGNOSIS — I480 Paroxysmal atrial fibrillation: Secondary | ICD-10-CM | POA: Insufficient documentation

## 2013-05-03 NOTE — Progress Notes (Signed)
Patient ID: [name], male   DOB: 1941/10/11, 72 y.o.   MRN: [id number]     HPI: [name], is a 72 y.o. male who presents to the office today for a six-month cardiology evaluation.   History Lloyd Huger has established vascular disease and is status post left fem-fem bypass surgery 1991, redo patch angioplasty graft 2000 with another redo 2004. In 2002 in 2004 he underwent carotid endarterectomies. Additional problems include paroxysmal atrial fibrillation, COPD, hypertension, anemia, type 2 diabetes mellitus, transient renal insufficiency, Barrett's esophagus, hiatal hernia, remote history of duodenal ulcer, and remote history of kidney stones.  Over the past 6 months, he is unaware of any breakthrough arrhythmia. He is on Coumadin anticoagulation. He tells me his primary physician checked a complete set of laboratory this past week. He does note some mild leg edema. He denies claudication. He denies chest pressure.   Past Medical History  Diagnosis Date  . Anemia   . Arthritis   . Hypertension   . Diabetes mellitus   . Hyperlipidemia   . Carotid artery occlusion     Carotid endarterectomy 2002 and 2004  . GERD (gastroesophageal reflux disease)   . Anxiety   . PAD (peripheral artery disease)     Fem-Fem by-pass graft 1991. redo patch angioplasty in 2000 and 2004.  LEA dopplers: 04/2012- R-ABI 1.34, L-ABI 0.97, patent Left lower extremity graft.  . Barrett esophagus   . History of stress test     March 2012 post stress ejection fraction 56%, negative for ischemia.  Last 2-D echocardiogram March 2012 younger than 55% mild concentric left ventricular hypertrophy grade 1 diastolic dysfunction. Mildly dilated left atrium. 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    Past Surgical History  Procedure Laterality Date  . Spine surgery    . Pr vein bypass graft,aorto-fem-pop  2007    Left Fem-post. tibial  . Pr vein bypass graft,aorto-fem-pop  1999-2004    numerous revision Left Fem-pop  . Carotid  endarterectomy  2002    Left CEA  . Carotid endarterectomy  2007    Left CEA redo    Allergies  Allergen Reactions  . Lopressor (Metoprolol Tartrate) Palpitations    Current Outpatient Prescriptions  Medication Sig Dispense Refill  . ALPRAZolam (XANAX) 0.5 MG tablet Take 0.5 mg by mouth at bedtime as needed.      Marland Kitchen amLODipine (NORVASC) 5 MG tablet Take 5 mg by mouth daily.      Marland Kitchen aspirin 81 MG tablet Take 81 mg by mouth daily.      . cilostazol (PLETAL) 100 MG tablet Take 100 mg by mouth 2 (two) times daily.      . cloNIDine (CATAPRES) 0.1 MG tablet Take 0.1 mg by mouth 2 (two) times daily.      Marland Kitchen docusate sodium (COLACE) 100 MG capsule Take 100 mg by mouth 2 (two) times daily.      . fenofibrate 160 MG tablet Take 160 mg by mouth daily.      Marland Kitchen glipiZIDE (GLUCOTROL) 5 MG tablet Take 5 mg by mouth 2 (two) times daily before a meal.      . metoprolol tartrate (LOPRESSOR) 25 MG tablet Take 25 mg by mouth 2 (two) times daily.      Marland Kitchen omega-3 acid ethyl esters (LOVAZA) 1 G capsule Take by mouth 2 (two) times daily. Takes 2 tablets      . oxycodone (ROXICODONE) 30 MG immediate release tablet Take 30 mg by mouth every 8 (eight) hours  as needed.      . pantoprazole (PROTONIX) 40 MG tablet Take 40 mg by mouth daily.      . pioglitazone (ACTOS) 30 MG tablet Take 30 mg by mouth daily.      . propafenone (RYTHMOL) 225 MG tablet Take 225 mg by mouth every 8 (eight) hours.      . simvastatin (ZOCOR) 80 MG tablet Take 80 mg by mouth at bedtime.      Marland Kitchen terazosin (HYTRIN) 10 MG capsule Take 10 mg by mouth at bedtime.      Marland Kitchen testosterone cypionate (DEPOTESTOTERONE CYPIONATE) 200 MG/ML injection Inject 200 mg into the skin Every month.      . warfarin (COUMADIN) 3 MG tablet Take 4 mg by mouth daily.        No current facility-administered medications for this visit.    Socially he is married and has 3 children 6 grandchildren and 5 great-grandchildren. He has a long-standing tobacco history. He does  not drink alcohol  ROS negative for fever chills night sweats. He denies rash. He denies presyncope or syncope. He denies chest pain. Denies recent wheezing. He denies abdominal pain. He denies any claudication presently. Times there some mild leg swelling. He does have diabetes mellitus. Other system review is negative.  PE BP 142/70  Pulse 63  Ht 5' 9.5" (1.765 m)  Wt 171 lb (77.565 kg)  BMI 24.9 kg/m2  General: Alert, oriented, no distress.  Skin: normal turgor, no rashes HEENT: Normocephalic, atraumatic. Pupils round and reactive; sclera anicteric;no lid lag,  Nose without nasal septal hypertrophy Mouth/Parynx benign; Mallinpatti scale** Neck: No JVD; soft bilateral carotid bruits left greater than right Lungs: Diffusely decreased breath sounds without wheezing.; no wheezing or rales Heart: RRR, s1 s2 normal 1/6 systolic murmur appear Chest wall with pectus excavatum. Abdomen: soft, nontender; no hepatosplenomehaly, BS+; soft abdominal bruit;  abdominal aorta nontender and not dilated by palpation. Pulses 2+ Extremities: no clubbing cyanosis or edema, Homan's sign negative  Neurologic: grossly nonfocal  ECG: Normal sinus rhythm with incomplete right bundle branch block.  LABS:  BMET    Component Value Date/Time   NA 144 06/30/2007 0556   K 3.3* 06/30/2007 0556   CL 112 06/30/2007 0556   CO2 23 06/30/2007 0556   GLUCOSE 108* 06/30/2007 0556   BUN 22 06/30/2007 0556   CREATININE 1.45 06/30/2007 0556   CALCIUM 9.7 06/30/2007 0556   GFRNONAA 49* 06/30/2007 0556   GFRAA  Value: 59        The eGFR has been calculated using the MDRD equation. This calculation has not been validated in all clinical* 06/30/2007 0556     Hepatic Function Panel     Component Value Date/Time   PROT 6.0 06/26/2007 1830   ALBUMIN 3.2* 06/26/2007 1830   AST 35 06/26/2007 1830   ALT 23 06/26/2007 1830   ALKPHOS 24* 06/26/2007 1830   BILITOT 1.0 06/28/2007 2225   BILIDIR 0.2 06/26/2007 1830   IBILI 0.4  06/26/2007 1830     CBC    Component Value Date/Time   WBC 8.0 06/30/2007 0556   RBC 3.74* 06/30/2007 0556   HGB 11.5 DELTA CHECK NOTED* 06/30/2007 0556   HCT 32.9* 06/30/2007 0556   PLT 220 06/30/2007 0556   MCV 88.0 06/30/2007 0556   MCHC 34.9 06/30/2007 0556   RDW 16.9* 06/30/2007 0556     BNP No results found for this basename: probnp    Lipid Panel  No results found for  this basename: chol, trig, hdl, cholhdl, vldl, ldlcalc     RADIOLOGY: No results found.    ASSESSMENT AND PLAN: Mr. Watling is maintaining sinus rhythm on his current doses of Popov unknown and metoprolol. We will try to obtain the lab work that was done by Dr. Foy Guadalajara and adjust medications accordingly if necessary. His QTc interval is 456 ms. PR interval 190 ms the he has had an atherogenic dyslipidemia lipid panel and NMR lipocytes profile may be indicated to more closely evaluate LDL particle number. Smoking cessation is imperative. I would recommend discontinuing his   simvastatin at its present dose  since the  maximum recommended dose is 20 mg. This may be able to be changed atorvastatin 40 mg or Crestor 20 mg for similar efficacy. I will see him in 6 months for cardiology followup evaluation per     Lennette Bihari, MD, [facility]  05/03/2013 3:15 PM

## 2013-05-04 ENCOUNTER — Telehealth: Payer: Self-pay | Admitting: *Deleted

## 2013-05-04 ENCOUNTER — Other Ambulatory Visit: Payer: Self-pay

## 2013-05-04 DIAGNOSIS — I739 Peripheral vascular disease, unspecified: Secondary | ICD-10-CM

## 2013-05-04 DIAGNOSIS — Z48812 Encounter for surgical aftercare following surgery on the circulatory system: Secondary | ICD-10-CM

## 2013-05-04 NOTE — Telephone Encounter (Signed)
Left message to return a call to me. 

## 2013-05-07 ENCOUNTER — Telehealth: Payer: Self-pay | Admitting: Cardiovascular Disease

## 2013-05-07 NOTE — Telephone Encounter (Signed)
[name] is returning a call. Someone Gave her a call but she is not sure who. And she is returning the call about [name] Tests

## 2013-05-07 NOTE — Telephone Encounter (Signed)
Returned call and spoke w/ pt's wife, Steward Drone.  Stated she received a message from the [facility] but couldn't make out the message.  Wanted to know if there were any questions Dr. Tresa Endo had.  Informed RN was unable to find where clinical staff called pt.  Wife stated it may have been Dr. Candie Chroman office calling.

## 2013-05-09 ENCOUNTER — Telehealth: Payer: Self-pay | Admitting: *Deleted

## 2013-05-09 NOTE — Telephone Encounter (Signed)
Called and s/w wife to inform her of medication change per Dr. Tresa Endo to d/c the simvastatin and change to atorvastatin 40 mg or crestor 20 mg. She informs me that his med list is incorrect. He is currently already taking 40 mg of atorvastatin. I told her that i will need to inform Dr. Tresa Endo and get back to them with additional orders. She requests something else in generic if possible. Staff  Message sent to Dr. Tresa Endo.

## 2013-05-17 ENCOUNTER — Encounter: Payer: Self-pay | Admitting: Vascular Surgery

## 2013-05-21 ENCOUNTER — Encounter: Payer: Self-pay | Admitting: Vascular Surgery

## 2013-05-21 ENCOUNTER — Other Ambulatory Visit: Payer: Self-pay | Admitting: *Deleted

## 2013-05-21 ENCOUNTER — Ambulatory Visit (INDEPENDENT_AMBULATORY_CARE_PROVIDER_SITE_OTHER): Payer: Medicare Other | Admitting: Vascular Surgery

## 2013-05-21 ENCOUNTER — Other Ambulatory Visit (INDEPENDENT_AMBULATORY_CARE_PROVIDER_SITE_OTHER): Payer: Medicare Other | Admitting: *Deleted

## 2013-05-21 DIAGNOSIS — I6529 Occlusion and stenosis of unspecified carotid artery: Secondary | ICD-10-CM

## 2013-05-21 DIAGNOSIS — I739 Peripheral vascular disease, unspecified: Secondary | ICD-10-CM

## 2013-05-21 DIAGNOSIS — Z48812 Encounter for surgical aftercare following surgery on the circulatory system: Secondary | ICD-10-CM

## 2013-05-21 NOTE — Progress Notes (Signed)
Subjective:     Patient ID: [name], male   DOB: 1941-04-18, 72 y.o.   MRN: [id number]  HPI this 72 year old male returns for continued followup regarding his left lower extremity femoral to posterior tibial bypass which was done using composite bilateral cephalic vein grafts. This was in 2007. Graft is functioning nicely. He denies any specific claudication symptoms but is able to walk one block and then stops because of fatigue. He denies any active neurologic symptoms such as lateralizing weakness, aphasia, amaurosis fugax, diplopia, blurred vision, or syncope. His biggest problem is severe back discomfort which is chronic in nature. He has had previous left carotid endarterectomy and redo left carotid endarterectomy most recent of which was 2007.  Past Medical History  Diagnosis Date  . Anemia   . Arthritis   . Hypertension   . Diabetes mellitus   . Hyperlipidemia   . Carotid artery occlusion     Carotid endarterectomy 2002 and 2004  . GERD (gastroesophageal reflux disease)   . Anxiety   . PAD (peripheral artery disease)     Fem-Fem by-pass graft 1991. redo patch angioplasty in 2000 and 2004.  LEA dopplers: 04/2012- R-ABI 1.34, L-ABI 0.97, patent Left lower extremity graft.  . Barrett esophagus   . History of stress test     March 2012 post stress ejection fraction 56%, negative for ischemia.  Last 2-D echocardiogram March 2012 younger than 55% mild concentric left ventricular hypertrophy grade 1 diastolic dysfunction. Mildly dilated left atrium. 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    History  Substance Use Topics  . Smoking status: Current Every Day Smoker -- 0.50 packs/day    Types: Cigarettes  . Smokeless tobacco: Former Neurosurgeon    Types: Chew    Quit date: 05/21/1968  . Alcohol Use: No    Family History  Problem Relation Age of Onset  . Heart disease Father     Allergies  Allergen Reactions  . Lopressor (Metoprolol Tartrate) Palpitations    Current outpatient  prescriptions:ALPRAZolam (XANAX) 0.5 MG tablet, Take 0.5 mg by mouth at bedtime as needed., Disp: , Rfl: ;  amLODipine (NORVASC) 5 MG tablet, Take 5 mg by mouth daily., Disp: , Rfl: ;  aspirin 81 MG tablet, Take 81 mg by mouth daily., Disp: , Rfl: ;  cilostazol (PLETAL) 100 MG tablet, Take 100 mg by mouth 2 (two) times daily., Disp: , Rfl:  cloNIDine (CATAPRES) 0.1 MG tablet, Take 0.1 mg by mouth 2 (two) times daily., Disp: , Rfl: ;  docusate sodium (COLACE) 100 MG capsule, Take 100 mg by mouth 2 (two) times daily., Disp: , Rfl: ;  fenofibrate 160 MG tablet, Take 160 mg by mouth daily., Disp: , Rfl: ;  glipiZIDE (GLUCOTROL) 5 MG tablet, Take 5 mg by mouth 2 (two) times daily before a meal., Disp: , Rfl:  metoprolol tartrate (LOPRESSOR) 25 MG tablet, Take 25 mg by mouth 2 (two) times daily., Disp: , Rfl: ;  omega-3 acid ethyl esters (LOVAZA) 1 G capsule, Take by mouth 2 (two) times daily. Takes 2 tablets, Disp: , Rfl: ;  oxycodone (ROXICODONE) 30 MG immediate release tablet, Take 30 mg by mouth every 8 (eight) hours as needed., Disp: , Rfl: ;  pantoprazole (PROTONIX) 40 MG tablet, Take 40 mg by mouth daily., Disp: , Rfl:  pioglitazone (ACTOS) 30 MG tablet, Take 30 mg by mouth daily., Disp: , Rfl: ;  propafenone (RYTHMOL) 225 MG tablet, Take 225 mg by mouth every 8 (eight) hours., Disp: ,  Rfl: ;  simvastatin (ZOCOR) 80 MG tablet, Take 80 mg by mouth at bedtime., Disp: , Rfl: ;  terazosin (HYTRIN) 10 MG capsule, Take 10 mg by mouth at bedtime., Disp: , Rfl:  testosterone cypionate (DEPOTESTOTERONE CYPIONATE) 200 MG/ML injection, Inject 200 mg into the skin Every month., Disp: , Rfl: ;  warfarin (COUMADIN) 3 MG tablet, Take 4 mg by mouth daily. , Disp: , Rfl:   BP 151/74  Pulse 64  Resp 18  Ht 5' 9.5" (1.765 m)  Wt 168 lb (76.204 kg)  BMI 24.46 kg/m2  Body mass index is 24.46 kg/(m^2).         Review of Systems denies chest pain, dyspnea on exertion, PND, orthopnea, hemoptysis. Has severe back  discomfort. Easy fatigability with ambulation which limits him. All other systems negative and complete review of systems    Objective:   Physical Exam pressure 151/74 heart rate 64 respirations 18 Gen.-alert and oriented x3 in no apparent distress HEENT normal for age Lungs no rhonchi or wheezing Cardiovascular regular rhythm no murmurs carotid pulses 3+ palpable -partial bruit left neck, high pitched bruit right neck  Abdomen soft nontender no palpable masses Musculoskeletal free of  major deformities Skin clear -no rashes Neurologic normal Lower extremities 3+ femoral and graft pulse left leg with 3+ posterior tibial pulse palpable. Right leg with 3+ femoral pulse but no distal pulses. Both feet well perfused with no evidence of cellulitis or infection or gangrene.  Today I ordered lower extremity duplex scan of the left femoral to posterior tibial bypass graft which revealed an area of high velocity in the left distal external iliac artery as well as the left common femoral artery. The bypass itself was quite tortuous but widely patent to the posterior tibial artery distally. Velocity was greater than 600 cm/s in the left external iliac artery  Also ordered a carotid duplex exam which I reviewed and interpreted. The redo left carotid endarterectomy site is widely patent. There is moderate left external carotid stenosis. The right internal carotid is widely patent with severe stenosis of the right external carotid       Assessment:     A nicely functioning left femoral to posterior tibial composite cephalic vein graft with evidence of significant left external iliac disease extending down into the left common femoral artery-patient on chronic Coumadin therapy Widely patent left redo carotid endarterectomy site with no evidence of significant right ICA stenosis    Plan:     Return in 9 months with duplex scan left femoral posterior tibial graft to once again look at external iliac  artery Patient has excellent 3-4+ pulses throughout left leg graft and a posterior tibial artery level so will not obtain angiogram at this time-we'll need to remove from Coumadin therapy and he has been doing quite well-return 9 months

## 2013-08-20 ENCOUNTER — Other Ambulatory Visit: Payer: Self-pay | Admitting: Cardiovascular Disease

## 2013-08-20 NOTE — Telephone Encounter (Signed)
Rx was sent to pharmacy electronically. 

## 2013-10-31 ENCOUNTER — Ambulatory Visit: Payer: Medicare Other | Admitting: Cardiovascular Disease

## 2013-11-02 ENCOUNTER — Ambulatory Visit: Payer: Medicare Other | Admitting: Cardiovascular Disease

## 2013-11-28 ENCOUNTER — Ambulatory Visit: Payer: Medicare Other | Admitting: Cardiovascular Disease

## 2013-12-10 ENCOUNTER — Ambulatory Visit (INDEPENDENT_AMBULATORY_CARE_PROVIDER_SITE_OTHER): Payer: Medicare Other | Admitting: Cardiovascular Disease

## 2013-12-10 ENCOUNTER — Encounter: Payer: Self-pay | Admitting: Cardiovascular Disease

## 2013-12-10 ENCOUNTER — Other Ambulatory Visit: Payer: Self-pay | Admitting: Cardiovascular Disease

## 2013-12-10 VITALS — BP 120/62 | HR 64 | Ht 69.5 in | Wt 166.8 lb

## 2013-12-10 DIAGNOSIS — I739 Peripheral vascular disease, unspecified: Secondary | ICD-10-CM

## 2013-12-10 DIAGNOSIS — K2270 Barrett's esophagus without dysplasia: Secondary | ICD-10-CM

## 2013-12-10 DIAGNOSIS — I10 Essential (primary) hypertension: Secondary | ICD-10-CM

## 2013-12-10 DIAGNOSIS — I4891 Unspecified atrial fibrillation: Secondary | ICD-10-CM

## 2013-12-10 DIAGNOSIS — J449 Chronic obstructive pulmonary disease, unspecified: Secondary | ICD-10-CM

## 2013-12-10 DIAGNOSIS — E119 Type 2 diabetes mellitus without complications: Secondary | ICD-10-CM

## 2013-12-10 DIAGNOSIS — I6529 Occlusion and stenosis of unspecified carotid artery: Secondary | ICD-10-CM

## 2013-12-10 NOTE — Telephone Encounter (Signed)
Rx was sent to pharmacy electronically. 

## 2013-12-10 NOTE — Patient Instructions (Signed)
Your physician recommends that you schedule a follow-up appointment in: 6 MONTHS. No changes were made today in your therapy. 

## 2013-12-11 ENCOUNTER — Encounter: Payer: Self-pay | Admitting: Cardiovascular Disease

## 2013-12-11 DIAGNOSIS — K2270 Barrett's esophagus without dysplasia: Secondary | ICD-10-CM | POA: Insufficient documentation

## 2013-12-11 DIAGNOSIS — I739 Peripheral vascular disease, unspecified: Secondary | ICD-10-CM | POA: Insufficient documentation

## 2013-12-11 DIAGNOSIS — E119 Type 2 diabetes mellitus without complications: Secondary | ICD-10-CM | POA: Insufficient documentation

## 2013-12-11 DIAGNOSIS — J449 Chronic obstructive pulmonary disease, unspecified: Secondary | ICD-10-CM | POA: Insufficient documentation

## 2013-12-11 NOTE — Progress Notes (Signed)
Patient ID: [name], male   DOB: Dec 17, 1940, 73 y.o.   MRN: [id number]     HPI: [name], is a 73 y.o. male who presents to the office today for a six-month cardiology evaluation.   [name] has established vascular disease and is status post left fem-fem bypass surgery 1991, redo patch angioplasty graft 2000 with another redo 2004. In 2002 in 2004 he underwent carotid endarterectomies. Additional problems include paroxysmal atrial fibrillation, COPD, hypertension, anemia, type 2 diabetes mellitus, transient renal insufficiency, Barrett's esophagus, hiatal hernia, remote history of duodenal ulcer, and remote history of kidney stones.  Over the past 6 months, he is unaware of any breakthrough arrhythmia. He is on Coumadin anticoagulation. He does have difficulty with his back. He does not routinely exercise. Unfortunately, he continues smoke cigarettes, currently one half pack per day.  He denies recent chest pain. He denies any wheezing exacerbation. He has been on Rhythmol for his paroxysmal atrial fibrillation. He has been on lipid-lowering therapy with atorvastatin 80 mg. His blood pressure has been treated with amlodipine 5 mg Catapres patch, metoprolol 25 mg twice a day. He is diabetic on Glucotrol 5 mg twice a day and Actos 30 mg. He continues to tolerate Protonix 40 mg for his history of Barrett's esophagus.  Past Medical History  Diagnosis Date  . Anemia   . Arthritis   . Hypertension   . Diabetes mellitus   . Hyperlipidemia   . Carotid artery occlusion     Carotid endarterectomy 2002 and 2004  . GERD (gastroesophageal reflux disease)   . Anxiety   . PAD (peripheral artery disease)     Fem-Fem by-pass graft 1991. redo patch angioplasty in 2000 and 2004.  LEA dopplers: 04/2012- R-ABI 1.34, L-ABI 0.97, patent Left lower extremity graft.  . Barrett esophagus   . History of stress test     March 2012 post stress ejection fraction 56%, negative for ischemia.  Last 2-D  echocardiogram March 2012 younger than 55% mild concentric left ventricular hypertrophy grade 1 diastolic dysfunction. Mildly dilated left atrium. 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    Past Surgical History  Procedure Laterality Date  . Spine surgery    . Pr vein bypass graft,aorto-fem-pop  2007    Left Fem-post. tibial  . Pr vein bypass graft,aorto-fem-pop  1999-2004    numerous revision Left Fem-pop  . Carotid endarterectomy  2002    Left CEA  . Carotid endarterectomy  2007    Left CEA redo    Allergies  Allergen Reactions  . Lopressor [Metoprolol Tartrate] Palpitations    Current Outpatient Prescriptions  Medication Sig Dispense Refill  . acarbose (PRECOSE) 50 MG tablet Take by mouth. 0.5 tablet twice daily.      Marland Kitchen ALPRAZolam (XANAX) 0.5 MG tablet Take 0.5 mg by mouth at bedtime as needed.      Marland Kitchen amLODipine (NORVASC) 5 MG tablet Take by mouth. Take 0.5 tablet daily      . aspirin 81 MG tablet Take 81 mg by mouth daily.      Marland Kitchen atorvastatin (LIPITOR) 80 MG tablet Take 80 mg by mouth. Takes 0.5 tablet daily.      . cilostazol (PLETAL) 100 MG tablet Take 100 mg by mouth 2 (two) times daily.      . cloNIDine (CATAPRES) 0.1 MG tablet Take 0.1 mg by mouth 2 (two) times daily.      Marland Kitchen docusate sodium (COLACE) 100 MG capsule Take 100 mg by mouth 2 (two)  times daily.      Marland Kitchen glipiZIDE (GLUCOTROL) 5 MG tablet Take 5 mg by mouth 2 (two) times daily before a meal.      . omega-3 acid ethyl esters (LOVAZA) 1 G capsule Take by mouth 2 (two) times daily. Takes 2 tablets      . oxycodone (ROXICODONE) 30 MG immediate release tablet Take 30 mg by mouth every 8 (eight) hours as needed.      . pantoprazole (PROTONIX) 40 MG tablet Take 40 mg by mouth daily.      . pioglitazone (ACTOS) 30 MG tablet Take 30 mg by mouth daily.      . propafenone (RYTHMOL) 225 MG tablet Take 1 tablet (225 mg total) by mouth every 8 (eight) hours.  90 tablet  5  . testosterone cypionate (DEPOTESTOTERONE CYPIONATE) 200 MG/ML  injection Inject 100 mg into the skin every 14 (fourteen) days.       . Vitamin D, Ergocalciferol, (DRISDOL) 50000 UNITS CAPS capsule Take 50,000 Units by mouth every 7 (seven) days.      Marland Kitchen warfarin (COUMADIN) 3 MG tablet Take 4 mg by mouth daily.       . metoprolol tartrate (LOPRESSOR) 25 MG tablet TAKE 1 TABLET BY MOUTH TWICE DAILY  180 tablet  3   No current facility-administered medications for this visit.    Socially he is married and has 3 children 6 grandchildren and 5 great-grandchildren. He has a long-standing tobacco history and is still smoking one half pack per day. He does not drink alcohol  ROS negative for fever chills night sweats. He denies rash. He denies change in vision or hearing. There is no lymphadenopathy. He denies presyncope or syncope. He denies recent wheezing. He denies COPD  exacerbation. He denies chest pain.  He denies abdominal pain. No nausea or vomiting. He denies any awareness of blood in stool or urine. He does have difficulty with his back making ambulation difficult. He denies any claudication presently. At times there some mild leg swelling. He does have diabetes mellitus. He denies tremors. He is unaware of thyroid issues.  Other comprehensive 14 point system review is negative.  PE BP 120/62  Pulse 64  Ht 5' 9.5" (1.765 m)  Wt 166 lb 12.8 oz (75.66 kg)  BMI 24.29 kg/m2  General: Alert, oriented, no distress.  Skin: normal turgor, no rashes HEENT: Normocephalic, atraumatic. Pupils round and reactive; sclera anicteric;no lid lag, no xanthelasmas Nose without nasal septal hypertrophy Mouth/Parynx benign; Mallinpatti scale 2  Neck: No JVD;  bilateral carotid bruits left greater than right Lungs: Diffusely decreased breath sounds without wheezing.; no wheezing or rales Heart: RRR, s1 s2 normal 1/6 systolic murmur; no rub, no S3 or S4 gallop. Chest wall with pectus excavatum. Abdomen: soft, nontender; no hepatosplenomehaly, BS+; soft abdominal bruit;   abdominal aorta nontender and not dilated by palpation. Back: No CVA tenderness Pulses 2+ Extremities: no clubbing cyanosis or edema, Homan's sign negative  Neurologic: grossly nonfocal Psychological: Normal affect and mood.   ECG (independently read by me): Normal sinus rhythm w reconsulted. Normal intervals. No significant ST changes.  LABS:  BMET    Component Value Date/Time   NA 144 06/30/2007 0556   K 3.3* 06/30/2007 0556   CL 112 06/30/2007 0556   CO2 23 06/30/2007 0556   GLUCOSE 108* 06/30/2007 0556   BUN 22 06/30/2007 0556   CREATININE 1.45 06/30/2007 0556   CALCIUM 9.7 06/30/2007 0556   GFRNONAA 49* 06/30/2007 0556  GFRAA  Value: 59        The eGFR has been calculated using the MDRD equation. This calculation has not been validated in all clinical* 06/30/2007 0556     Hepatic Function Panel     Component Value Date/Time   PROT 6.0 06/26/2007 1830   ALBUMIN 3.2* 06/26/2007 1830   AST 35 06/26/2007 1830   ALT 23 06/26/2007 1830   ALKPHOS 24* 06/26/2007 1830   BILITOT 1.0 06/28/2007 2225   BILIDIR 0.2 06/26/2007 1830   IBILI 0.4 06/26/2007 1830     CBC    Component Value Date/Time   WBC 8.0 06/30/2007 0556   RBC 3.74* 06/30/2007 0556   RBC 3.25* 06/28/2007 2225   HGB 11.5 DELTA CHECK NOTED* 06/30/2007 0556   HCT 32.9* 06/30/2007 0556   PLT 220 06/30/2007 0556   MCV 88.0 06/30/2007 0556   MCHC 34.9 06/30/2007 0556   RDW 16.9* 06/30/2007 0556     BNP No results found for this basename: probnp    Lipid Panel  No results found for this basename: chol,  trig,  hdl,  cholhdl,  vldl,  ldlcalc     RADIOLOGY: No results found.    ASSESSMENT AND PLAN: Mr. Schnitker is maintaining sinus rhythm on his current doses of rhythmol and metoprolol. His QT interval is 425 ms which is normal. He's not having any breakthrough atrial fibrillation. I again discussed the importance of complete smoking cessation but I am very doubtful that this will be successful. When I last saw him I  discontinued his simvastatin since he also was on amlodipine and wanted more aggressive lipid lowering. He has tolerated atorvastatin and takes 40 mg daily. His blood pressure is well controlled on current therapy. He also is taking Pletal 100 mg daily for his lower extremity peripheral vascular disease. He  does not have significant claudication. Apparently he tells me Dr. Kellie Simmering will be rechecking his carotid as well as lower extremity Doppler studies. I will see him back in the office for six-month cardiological evaluation and further recommendations will be made at that time.    Troy Sine, MD, [facility]  12/11/2013 3:39 PM

## 2014-02-19 ENCOUNTER — Inpatient Hospital Stay (HOSPITAL_COMMUNITY): Admission: RE | Admit: 2014-02-19 | Payer: Medicare Other | Source: Ambulatory Visit

## 2014-02-19 ENCOUNTER — Ambulatory Visit: Payer: Medicare Other | Admitting: Vascular Surgery

## 2014-03-18 ENCOUNTER — Encounter: Payer: Self-pay | Admitting: Family

## 2014-03-19 ENCOUNTER — Ambulatory Visit (HOSPITAL_COMMUNITY)
Admission: RE | Admit: 2014-03-19 | Discharge: 2014-03-19 | Disposition: A | Discharge status: Home / Self Care | Payer: Medicare Other | Source: Ambulatory Visit | Attending: Vascular Surgery | Admitting: Vascular Surgery

## 2014-03-19 ENCOUNTER — Ambulatory Visit (INDEPENDENT_AMBULATORY_CARE_PROVIDER_SITE_OTHER)
Admission: RE | Admit: 2014-03-19 | Discharge: 2014-03-19 | Disposition: A | Discharge status: Home / Self Care | Payer: Medicare Other | Source: Ambulatory Visit | Attending: Vascular Surgery | Admitting: Vascular Surgery

## 2014-03-19 ENCOUNTER — Encounter: Payer: Self-pay | Admitting: Family

## 2014-03-19 ENCOUNTER — Ambulatory Visit (INDEPENDENT_AMBULATORY_CARE_PROVIDER_SITE_OTHER): Payer: Medicare Other | Admitting: Family

## 2014-03-19 ENCOUNTER — Ambulatory Visit: Payer: Medicare Other | Admitting: Vascular Surgery

## 2014-03-19 VITALS — BP 141/70 | HR 64 | Resp 14 | Ht 69.5 in | Wt 168.0 lb

## 2014-03-19 DIAGNOSIS — I739 Peripheral vascular disease, unspecified: Secondary | ICD-10-CM

## 2014-03-19 DIAGNOSIS — Z48812 Encounter for surgical aftercare following surgery on the circulatory system: Secondary | ICD-10-CM

## 2014-03-19 DIAGNOSIS — I6529 Occlusion and stenosis of unspecified carotid artery: Secondary | ICD-10-CM

## 2014-03-19 DIAGNOSIS — Z452 Encounter for adjustment and management of vascular access device: Secondary | ICD-10-CM | POA: Insufficient documentation

## 2014-03-19 NOTE — Progress Notes (Signed)
[facility] [place] HISTORY AND PHYSICAL   MRN : [id number]  History of Present Illness:   [name] is a 73 y.o. male patient of [name] who returns for continued followup regarding his left lower extremity femoral to posterior tibial bypass which was done using composite bilateral cephalic vein grafts in 2007. He has had previous left carotid endarterectomy and redo left carotid endarterectomy most recent of which was 2007.  He denies any specific claudication symptoms but is able to walk one block and then stops because of fatigue. His biggest problem is severe back discomfort which is chronic in nature.   Pt denies any claudication symptoms with walking, but does have occasional left lateral calf pain which he attributes to low back issues, he takes oxycodone for back pain. Pt denies any non healing wounds. Pt denies any stroke or TIA history. He used to have his carotid Duplex checked in [name]'s office, but was last checked in this office July, 2014: <40% right ICA stenosis, widely patent left ICA. Bilateral ECA stenosis.   Pt Diabetic: Yes, states in good control Pt smoker: smoker  (using e-cigarettes recently)  Current Outpatient Prescriptions  Medication Sig Dispense Refill  . acarbose (PRECOSE) 50 MG tablet Take by mouth 3 (three) times daily with meals. 0.5 tablet twice daily.      [name]. ALPRAZolam (XANAX) 0.5 MG tablet Take 0.5 mg by mouth at bedtime as needed.      [name]. amLODipine (NORVASC) 5 MG tablet Take by mouth. Take 0.5 tablet daily      . aspirin 81 MG tablet Take 81 mg by mouth daily.      [name]. atorvastatin (LIPITOR) 80 MG tablet Take 80 mg by mouth. Takes 0.5 tablet daily.      . cilostazol (PLETAL) 100 MG tablet Take 100 mg by mouth 2 (two) times daily.      . cloNIDine (CATAPRES) 0.1 MG tablet Take 0.1 mg by mouth 2 (two) times daily.      [name]. docusate sodium (COLACE) 100 MG capsule Take 100 mg by mouth 2 (two) times daily.      [name].  glipiZIDE (GLUCOTROL) 5 MG tablet Take 5 mg by mouth 2 (two) times daily before a meal.      . metoprolol tartrate (LOPRESSOR) 25 MG tablet TAKE 1 TABLET BY MOUTH TWICE DAILY  180 tablet  3  . omega-3 acid ethyl esters (LOVAZA) 1 G capsule Take by mouth 2 (two) times daily. Takes 2 tablets      . oxycodone (ROXICODONE) 30 MG immediate release tablet Take 30 mg by mouth every 8 (eight) hours as needed.      . pantoprazole (PROTONIX) 40 MG tablet Take 40 mg by mouth 2 (two) times daily.       . propafenone (RYTHMOL) 225 MG tablet Take 1 tablet (225 mg total) by mouth every 8 (eight) hours.  90 tablet  5  . testosterone cypionate (DEPOTESTOTERONE CYPIONATE) 200 MG/ML injection Inject 100 mg into the skin every 14 (fourteen) days.       . Vitamin D, Ergocalciferol, (DRISDOL) 50000 UNITS CAPS capsule Take 50,000 Units by mouth every 7 (seven) days.      [name]. warfarin (COUMADIN) 3 MG tablet Take 4 mg by mouth daily.       . pioglitazone (ACTOS) 30 MG tablet Take 30 mg by mouth daily.       No current facility-administered medications for this visit.    Pt  meds include:  Statin :Yes Betablocker: Yes ASA: Yes Other anticoagulants/antiplatelets: taking coumadin for PAD  Past Medical History  Diagnosis Date  . Anemia   . Arthritis   . Hypertension   . Diabetes mellitus   . Hyperlipidemia   . Carotid artery occlusion     Carotid endarterectomy 2002 and 2004  . GERD (gastroesophageal reflux disease)   . Anxiety   . PAD (peripheral artery disease)     Fem-Fem by-pass graft 1991. redo patch angioplasty in 2000 and 2004.  LEA dopplers: 04/2012- R-ABI 1.34, L-ABI 0.97, patent Left lower extremity graft.  . Barrett esophagus   . History of stress test     March 2012 post stress ejection fraction 56%, negative for ischemia.  Last 2-D echocardiogram March 2012 younger than 55% mild concentric left ventricular hypertrophy grade 1 diastolic dysfunction. Mildly dilated left atrium. Atrial septum was  aneurysmal..  . COPD (chronic obstructive pulmonary disease)     Past Surgical History  Procedure Laterality Date  . Pr vein bypass graft,aorto-fem-pop  2007    Left Fem-post. tibial  . Pr vein bypass graft,aorto-fem-pop  1999-2004    numerous revision Left Fem-pop  . Carotid endarterectomy  2002    Left CEA  . Carotid endarterectomy  2007    Left CEA redo  . Eye surgery Left 02-02-95    Cataract  . Eye surgery Right 04-20-95    Cataract  . Spine surgery  1980    Social History History  Substance Use Topics  . Smoking status: Current Every Day Smoker -- 0.50 packs/day    Types: E-cigarettes  . Smokeless tobacco: Former NeurosurgeonUser    Types: Chew    Quit date: 05/21/1968  . Alcohol Use: No    Family History Family History  Problem Relation Age of Onset  . Heart disease Father   . Heart attack Father     X's 3  . Hyperlipidemia Father   . Hypertension Father   . Diabetes Brother   . Hypertension Brother   . Cancer Sister     Allergies  Allergen Reactions  . Lopressor [Metoprolol Tartrate] Palpitations     REVIEW OF SYSTEMS: See HPI for pertinent positives and negatives.  Physical Examination Filed Vitals:   03/19/14 1539  BP: 141/70  Pulse: 64  Resp: 14  Height: 5' 9.5" (1.765 m)  Weight: 168 lb (76.204 kg)  SpO2: 97%   Body mass index is 24.46 kg/(m^2).  General:  WDWN in NAD Gait: Normal HENT: WNL Eyes: Pupils equal Pulmonary: normal non-labored breathing , without Rales, rhonchi,  Wheezing, diminished air movement left posterior fields Cardiac: RRR, no murmurs detected  Abdomen: soft, NT, no masses Skin: no rashes, ulcers noted;  no Gangrene , no cellulitis; no open wounds;   VASCULAR EXAM  Carotid Bruits Left Right   Positive, moderate Positive, soft      Aorta is not palpable. Radial pulses are 2+ palpable bilateral                       VASCULAR EXAM: Extremities without ischemic changes  without Gangrene; without open wounds.  LE Pulses LEFT RIGHT       FEMORAL  3+ palpable  3+ palpable        POPLITEAL  not palpable   not palpable       POSTERIOR TIBIAL  3+ palpable   not palpable        DORSALIS PEDIS      ANTERIOR TIBIAL not palpable  not palpable     Musculoskeletal: no muscle wasting or atrophy; no edema  Neurologic: A&O X 3; Appropriate Affect ;  SENSATION: normal; MOTOR FUNCTION: 5/5 iSymmetric, CN 2-12 intact Speech is fluent/normal. Has some hearing loss.   Significant Diagnostic Studies:   Non-Invasive Vascular Imaging:  LOWER EXTREMITY ARTERIAL DUPLEX EVALUATION (03/19/2014)     INDICATION: Peripheral Vascular Disease     PREVIOUS INTERVENTION(S): Left femoral-distal PTA BPG 05/11/2006    DUPLEX EXAM:     RIGHT  LEFT   Peak Systolic Velocity (cm/s) Ratio (if abnormal) Waveform  Peak Systolic Velocity (cm/s) Ratio (if abnormal) Waveform     Inflow Artery 196  B     Proximal Anastomosis 212 4.6 B     Proximal Graft 46  B     Mid Graft 46  B      Distal Graft 41  B     Distal Anastomosis 80  B     Outflow Artery 97  B  0.58/0.28 Today's ABI / TBI 0.98/0.60  N/C; 0.17 Previous ABI / TBI (05/03/2013) 0.92/0.43    Waveform:    M - Monophasic       B - Biphasic       T - Triphasic  If Ankle Brachial Index (ABI) or Toe Brachial Index (TBI) performed, please see complete report     ADDITIONAL FINDINGS:     IMPRESSION: Left arterial inflow presents with stenosis of greater than 50% with turbulent disturbed waveforms suggestive of a more proximal disease process. Patent left femoral to distal posterior tibial artery bypass graft, no hyperplasia or hemodynamically significant stenosis present.    Compared to the previous exam:  Improved toe brachial indices and stable left ankle brachial index since previous study on 05/03/2013.     ASSESSMENT:  [name] is a 73 y.o. male patient  who is s/p left lower extremity femoral to posterior tibial bypass which was done using composite bilateral cephalic vein grafts in 2007. He has had previous left carotid endarterectomy and redo left carotid endarterectomy most recent of which was 2007. He used to have his carotid Duplex checked in Dr. Kandis Cocking office, but was last checked in this office July, 2014: <40% right ICA stenosis, widely patent left ICA. Bilateral ECA stenosis; will recheck in a year when he returns for PAD evaluation. Left arterial inflow presents with stenosis of greater than 50% with turbulent disturbed waveforms suggestive of a more proximal disease process. Patent left femoral to distal posterior tibial artery bypass graft, no hyperplasia or hemodynamically significant stenosis present. Improved toe brachial indices and stable left ankle brachial index since previous study on 05/03/2013.   PLAN:   I counseled him re nicotine/tobacco cessation. Based on today's exam and non-invasive vascular lab results, the patient will follow up in 1 year with the following tests left LE arterial Duplex, ABI's, and carotid Duplex. I discussed in depth with the patient the nature of atherosclerosis, and emphasized the importance of maximal medical management including strict control of blood pressure, blood glucose, and lipid levels, obtaining regular exercise, and cessation of smoking.  The  patient is aware that without maximal medical management the underlying atherosclerotic disease process will progress, limiting the benefit of any interventions.  The patient was given information about stroke prevention and what symptoms should prompt the patient to seek immediate medical care.  The patient was given information about PAD including signs, symptoms, treatment, what symptoms should prompt the patient to seek immediate medical care, and risk reduction measures to take. Thank you for allowing [place] to participate in this patient's  care.  Charisse March, RN, MSN, FNP-C [facility] Office: [phone]  Clinic MD: Early 03/19/2014 3:51 PM

## 2014-03-19 NOTE — Patient Instructions (Addendum)
Peripheral Vascular Disease Peripheral Vascular Disease (PVD), also called Peripheral Arterial Disease (PAD), is a circulation problem caused by cholesterol (atherosclerotic plaque) deposits in the arteries. PVD commonly occurs in the lower extremities (legs) but it can occur in other areas of the body, such as your arms. The cholesterol buildup in the arteries reduces blood flow which can cause pain and other serious problems. The presence of PVD can place a person at risk for Coronary Artery Disease (CAD).  CAUSES  Causes of PVD can be many. It is usually associated with more than one risk factor such as:   High Cholesterol.  Smoking.  Diabetes.  Lack of exercise or inactivity.  High blood pressure (hypertension).  Obesity.  Family history. SYMPTOMS   When the lower extremities are affected, patients with PVD may experience:  Leg pain with exertion or physical activity. This is called INTERMITTENT CLAUDICATION. This may present as cramping or numbness with physical activity. The location of the pain is associated with the level of blockage. For example, blockage at the abdominal level (distal abdominal aorta) may result in buttock or hip pain. Lower leg arterial blockage may result in calf pain.  As PVD becomes more severe, pain can develop with less physical activity.  In people with severe PVD, leg pain may occur at rest.  Other PVD signs and symptoms:  Leg numbness or weakness.  Coldness in the affected leg or foot, especially when compared to the other leg.  A change in leg color.  Patients with significant PVD are more prone to ulcers or sores on toes, feet or legs. These may take longer to heal or may reoccur. The ulcers or sores can become infected.  If signs and symptoms of PVD are ignored, gangrene may occur. This can result in the loss of toes or loss of an entire limb.  Not all leg pain is related to PVD. Other medical conditions can cause leg pain such  as:  Blood clots (embolism) or Deep Vein Thrombosis.  Inflammation of the blood vessels (vasculitis).  Spinal stenosis. DIAGNOSIS  Diagnosis of PVD can involve several different types of tests. These can include:  Pulse Volume Recording Method (PVR). This test is simple, painless and does not involve the use of X-rays. PVR involves measuring and comparing the blood pressure in the arms and legs. An ABI (Ankle-Brachial Index) is calculated. The normal ratio of blood pressures is 1. As this number becomes smaller, it indicates more severe disease.  < 0.95  indicates significant narrowing in one or more leg vessels.  <0.8 there will usually be pain in the foot, leg or buttock with exercise.  <0.4 will usually have pain in the legs at rest.  <0.25  usually indicates limb threatening PVD.  Doppler detection of pulses in the legs. This test is painless and checks to see if you have a pulses in your legs/feet.  A dye or contrast material (a substance that highlights the blood vessels so they show up on x-ray) may be given to help your caregiver better see the arteries for the following tests. The dye is eliminated from your body by the kidney's. Your caregiver may order blood work to check your kidney function and other laboratory values before the following tests are performed:  Magnetic Resonance Angiography (MRA). An MRA is a picture study of the blood vessels and arteries. The MRA machine uses a large magnet to produce images of the blood vessels.  Computed Tomography Angiography (CTA). A CTA is a   specialized x-ray that looks at how the blood flows in your blood vessels. An IV may be inserted into your arm so contrast dye can be injected.  Angiogram. Is a procedure that uses x-rays to look at your blood vessels. This procedure is minimally invasive, meaning a small incision (cut) is made in your groin. A small tube (catheter) is then inserted into the artery of your groin. The catheter is  guided to the blood vessel or artery your caregiver wants to examine. Contrast dye is injected into the catheter. X-rays are then taken of the blood vessel or artery. After the images are obtained, the catheter is taken out. TREATMENT  Treatment of PVD involves many interventions which may include:  Lifestyle changes:  Quitting smoking.  Exercise.  Following a low fat, low cholesterol diet.  Control of diabetes.  Foot care is very important to the PVD patient. Good foot care can help prevent infection.  Medication:  Cholesterol-lowering medicine.  Blood pressure medicine.  Anti-platelet drugs.  Certain medicines may reduce symptoms of Intermittent Claudication.  Interventional/Surgical options:  Angioplasty. An Angioplasty is a procedure that inflates a balloon in the blocked artery. This opens the blocked artery to improve blood flow.  Stent Implant. A wire mesh tube (stent) is placed in the artery. The stent expands and stays in place, allowing the artery to remain open.  Peripheral Bypass Surgery. This is a surgical procedure that reroutes the blood around a blocked artery to help improve blood flow. This type of procedure may be performed if Angioplasty or stent implants are not an option. SEEK IMMEDIATE MEDICAL CARE IF:   You develop pain or numbness in your arms or legs.  Your arm or leg turns cold, becomes blue in color.  You develop redness, warmth, swelling and pain in your arms or legs. MAKE SURE YOU:   Understand these instructions.  Will watch your condition.  Will get help right away if you are not doing well or get worse. Document Released: 12/09/2004 Document Revised: 01/24/2012 Document Reviewed: 11/05/2008 [employer] Patient Information 2014 [employer].   Smoking Cessation Quitting smoking is important to your health and has many advantages. However, it is not always easy to quit since nicotine is a very addictive drug. Often times, people try 3  times or more before being able to quit. This document explains the best ways for you to prepare to quit smoking. Quitting takes hard work and a lot of effort, but you can do it. ADVANTAGES OF QUITTING SMOKING  You will live longer, feel better, and live better.  Your body will feel the impact of quitting smoking almost immediately.  Within 20 minutes, blood pressure decreases. Your pulse returns to its normal level.  After 8 hours, carbon monoxide levels in the blood return to normal. Your oxygen level increases.  After 24 hours, the chance of having a heart attack starts to decrease. Your breath, hair, and body stop smelling like smoke.  After 48 hours, damaged nerve endings begin to recover. Your sense of taste and smell improve.  After 72 hours, the body is virtually free of nicotine. Your bronchial tubes relax and breathing becomes easier.  After 2 to 12 weeks, lungs can hold more air. Exercise becomes easier and circulation improves.  The risk of having a heart attack, stroke, cancer, or lung disease is greatly reduced.  After 1 year, the risk of coronary heart disease is cut in half.  After 5 years, the risk of stroke falls to   the same as a nonsmoker.  After 10 years, the risk of lung cancer is cut in half and the risk of other cancers decreases significantly.  After 15 years, the risk of coronary heart disease drops, usually to the level of a nonsmoker.  If you are pregnant, quitting smoking will improve your chances of having a healthy baby.  The people you live with, especially any children, will be healthier.  You will have extra money to spend on things other than cigarettes. QUESTIONS TO THINK ABOUT BEFORE ATTEMPTING TO QUIT You may want to talk about your answers with your caregiver.  Why do you want to quit?  If you tried to quit in the past, what helped and what did not?  What will be the most difficult situations for you after you quit? How will you plan to  handle them?  Who can help you through the tough times? Your family? Friends? A caregiver?  What pleasures do you get from smoking? What ways can you still get pleasure if you quit? Here are some questions to ask your caregiver:  How can you help me to be successful at quitting?  What medicine do you think would be best for me and how should I take it?  What should I do if I need more help?  What is smoking withdrawal like? How can I get information on withdrawal? GET READY  Set a quit date.  Change your environment by getting rid of all cigarettes, ashtrays, matches, and lighters in your home, car, or work. Do not let people smoke in your home.  Review your past attempts to quit. Think about what worked and what did not. GET SUPPORT AND ENCOURAGEMENT You have a better chance of being successful if you have help. You can get support in many ways.  Tell your family, friends, and co-workers that you are going to quit and need their support. Ask them not to smoke around you.  Get individual, group, or telephone counseling and support. Programs are available at local hospitals and health centers. Call your local health department for information about programs in your area.  Spiritual beliefs and practices may help some smokers quit.  Download a "quit meter" on your computer to keep track of quit statistics, such as how long you have gone without smoking, cigarettes not smoked, and money saved.  Get a self-help book about quitting smoking and staying off of tobacco. LEARN NEW SKILLS AND BEHAVIORS  Distract yourself from urges to smoke. Talk to someone, go for a walk, or occupy your time with a task.  Change your normal routine. Take a different route to work. Drink tea instead of coffee. Eat breakfast in a different place.  Reduce your stress. Take a hot bath, exercise, or read a book.  Plan something enjoyable to do every day. Reward yourself for not smoking.  Explore  interactive web-based programs that specialize in helping you quit. GET MEDICINE AND USE IT CORRECTLY Medicines can help you stop smoking and decrease the urge to smoke. Combining medicine with the above behavioral methods and support can greatly increase your chances of successfully quitting smoking.  Nicotine replacement therapy helps deliver nicotine to your body without the negative effects and risks of smoking. Nicotine replacement therapy includes nicotine gum, lozenges, inhalers, nasal sprays, and skin patches. Some may be available over-the-counter and others require a prescription.  Antidepressant medicine helps people abstain from smoking, but how this works is unknown. This medicine is available by prescription.    Nicotinic receptor partial agonist medicine simulates the effect of nicotine in your brain. This medicine is available by prescription. Ask your caregiver for advice about which medicines to use and how to use them based on your health history. Your caregiver will tell you what side effects to look out for if you choose to be on a medicine or therapy. Carefully read the information on the package. Do not use any other product containing nicotine while using a nicotine replacement product.  RELAPSE OR DIFFICULT SITUATIONS Most relapses occur within the first 3 months after quitting. Do not be discouraged if you start smoking again. Remember, most people try several times before finally quitting. You may have symptoms of withdrawal because your body is used to nicotine. You may crave cigarettes, be irritable, feel very hungry, cough often, get headaches, or have difficulty concentrating. The withdrawal symptoms are only temporary. They are strongest when you first quit, but they will go away within 10 14 days. To reduce the chances of relapse, try to:  Avoid drinking alcohol. Drinking lowers your chances of successfully quitting.  Reduce the amount of caffeine you consume. Once you  quit smoking, the amount of caffeine in your body increases and can give you symptoms, such as a rapid heartbeat, sweating, and anxiety.  Avoid smokers because they can make you want to smoke.  Do not let weight gain distract you. Many smokers will gain weight when they quit, usually less than 10 pounds. Eat a healthy diet and stay active. You can always lose the weight gained after you quit.  Find ways to improve your mood other than smoking. FOR MORE INFORMATION  www.smokefree.gov  Document Released: 10/26/2001 Document Revised: 05/02/2012 Document Reviewed: 02/10/2012 [employer] Patient Information 2014 [employer].   Stroke Prevention Some medical conditions and behaviors are associated with an increased chance of having a stroke. You may prevent a stroke by making healthy choices and managing medical conditions. HOW CAN I REDUCE MY RISK OF HAVING A STROKE?   Stay physically active. Get at least 30 minutes of activity on most or all days.  Do not smoke. It may also be helpful to avoid exposure to secondhand smoke.  Limit alcohol use. Moderate alcohol use is considered to be:  No more than 2 drinks per day for men.  No more than 1 drink per day for nonpregnant women.  Eat healthy foods. This involves  Eating 5 or more servings of fruits and vegetables a day.  Following a diet that addresses high blood pressure (hypertension), high cholesterol, diabetes, or obesity.  Manage your cholesterol levels.  A diet low in saturated fat, trans fat, and cholesterol and high in fiber may control cholesterol levels.  Take any prescribed medicines to control cholesterol as directed by your health care provider.  Manage your diabetes.  A controlled-carbohydrate, controlled-sugar diet is recommended to manage diabetes.  Take any prescribed medicines to control diabetes as directed by your health care provider.  Control your hypertension.  A low-salt (sodium), low-saturated fat,  low-trans fat, and low-cholesterol diet is recommended to manage hypertension.  Take any prescribed medicines to control hypertension as directed by your health care provider.  Maintain a healthy weight.  A reduced-calorie, low-sodium, low-saturated fat, low-trans fat, low-cholesterol diet is recommended to manage weight.  Stop drug abuse.  Avoid taking birth control pills.  Talk to your health care provider about the risks of taking birth control pills if you are over 35 years old, smoke, get migraines, or have   ever had a blood clot.  Get evaluated for sleep disorders (sleep apnea).  Talk to your health care provider about getting a sleep evaluation if you snore a lot or have excessive sleepiness.  Take medicines as directed by your health care provider.  For some people, aspirin or blood thinners (anticoagulants) are helpful in reducing the risk of forming abnormal blood clots that can lead to stroke. If you have the irregular heart rhythm of atrial fibrillation, you should be on a blood thinner unless there is a good reason you cannot take them.  Understand all your medicine instructions.  Make sure that other other conditions (such as anemia or atherosclerosis) are addressed. SEEK IMMEDIATE MEDICAL CARE IF:   You have sudden weakness or numbness of the face, arm, or leg, especially on one side of the body.  Your face or eyelid droops to one side.  You have sudden confusion.  You have trouble speaking (aphasia) or understanding.  You have sudden trouble seeing in one or both eyes.  You have sudden trouble walking.  You have dizziness.  You have a loss of balance or coordination.  You have a sudden, severe headache with no known cause.  You have new chest pain or an irregular heartbeat. Any of these symptoms may represent a serious problem that is an emergency. Do not wait to see if the symptoms will go away. Get medical help at once. Call your local emergency services   (911 in [place] Do not drive yourself to the hospital. Document Released: 12/09/2004 Document Revised: 08/22/2013 Document Reviewed: 05/04/2013 [employer] Patient Information 2014 [employer].  

## 2014-03-20 NOTE — Addendum Note (Signed)
Addended by: Sharee PimpleMCCHESNEY, MARILYN K on: 03/20/2014 10:34 AM   Modules accepted: Orders

## 2014-05-13 ENCOUNTER — Other Ambulatory Visit: Payer: Self-pay | Admitting: Cardiovascular Disease

## 2014-08-08 ENCOUNTER — Ambulatory Visit (INDEPENDENT_AMBULATORY_CARE_PROVIDER_SITE_OTHER): Payer: Medicare Other | Admitting: Cardiovascular Disease

## 2014-08-08 ENCOUNTER — Ambulatory Visit
Admission: RE | Admit: 2014-08-08 | Discharge: 2014-08-08 | Disposition: A | Discharge status: Home / Self Care | Payer: Medicare Other | Source: Ambulatory Visit | Attending: Cardiovascular Disease | Admitting: Cardiovascular Disease

## 2014-08-08 ENCOUNTER — Encounter: Payer: Self-pay | Admitting: Cardiovascular Disease

## 2014-08-08 VITALS — BP 162/60 | HR 71 | Ht 69.0 in | Wt 156.5 lb

## 2014-08-08 DIAGNOSIS — I119 Hypertensive heart disease without heart failure: Secondary | ICD-10-CM

## 2014-08-08 DIAGNOSIS — R634 Abnormal weight loss: Secondary | ICD-10-CM

## 2014-08-08 DIAGNOSIS — R0602 Shortness of breath: Secondary | ICD-10-CM

## 2014-08-08 DIAGNOSIS — K2270 Barrett's esophagus without dysplasia: Secondary | ICD-10-CM

## 2014-08-08 DIAGNOSIS — I4891 Unspecified atrial fibrillation: Secondary | ICD-10-CM

## 2014-08-08 DIAGNOSIS — I6529 Occlusion and stenosis of unspecified carotid artery: Secondary | ICD-10-CM

## 2014-08-08 DIAGNOSIS — I480 Paroxysmal atrial fibrillation: Secondary | ICD-10-CM

## 2014-08-08 DIAGNOSIS — E119 Type 2 diabetes mellitus without complications: Secondary | ICD-10-CM

## 2014-08-08 DIAGNOSIS — Z7901 Long term (current) use of anticoagulants: Secondary | ICD-10-CM

## 2014-08-08 DIAGNOSIS — I739 Peripheral vascular disease, unspecified: Secondary | ICD-10-CM

## 2014-08-08 DIAGNOSIS — Z5181 Encounter for therapeutic drug level monitoring: Secondary | ICD-10-CM

## 2014-08-08 NOTE — Progress Notes (Signed)
Patient ID: [name], male   DOB: 1941-08-30, 73 y.o.   MRN: [id number]   HPI: [name] is a 73 y.o. male who presents to the office today for an 50-monthcardiology evaluation.   Mr. NVoldenhas established vascular disease and is status post left fem-fem bypass surgery 1991, redo patch angioplasty graft 2000 with another redo 2004. In 2002 in 2004 he underwent carotid endarterectomies. Additional problems include paroxysmal atrial fibrillation, COPD, hypertension, anemia, type 2 diabetes mellitus, transient renal insufficiency, Barrett's esophagus, hiatal hernia, remote history of duodenal ulcer, and remote history of kidney stones.  Over the past 8 months, he is unaware of any breakthrough arrhythmia. He is on Coumadin anticoagulation. He does have difficulty with his back. He does not routinely exercise. Unfortunately, he continues smoke cigarettes, currently one half pack per day.  He denies recent chest pain. He denies any wheezing exacerbation. He has been on Rhythmol for his paroxysmal atrial fibrillation. He has been on lipid-lowering therapy with atorvastatin 80 mg. His blood pressure has been treated with amlodipine 5 mg Catapres patch, metoprolol 25 mg twice a day. He is diabetic on Glucotrol 5 mg twice a day and Actos 30 mg. He continues to tolerate Protonix 40 mg for his history of Barrett's esophagus.  The past year, he admits to significant weight loss despite trying to.  He denies any fever, chills, or night sweats.  He denies any change in sputum production.  He is unaware of any hemoptysis.  He denies any black or tarry stools.  He does have approximate 50 to tobacco use. He presents for evaluation.  Past Medical History  Diagnosis Date  . Anemia   . Arthritis   . Hypertension   . Diabetes mellitus   . Hyperlipidemia   . Carotid artery occlusion     Carotid endarterectomy 2002 and 2004  . GERD (gastroesophageal reflux disease)   . Anxiety   . PAD (peripheral artery  disease)     Fem-Fem by-pass graft 1991. redo patch angioplasty in 2000 and 2004.  LEA dopplers: 04/2012- R-ABI 1.34, L-ABI 0.97, patent Left lower extremity graft.  . Barrett esophagus   . History of stress test     March 2012 post stress ejection fraction 56%, negative for ischemia.  Last 2-D echocardiogram March 2012 younger than 55% mild concentric left ventricular hypertrophy grade 1 diastolic dysfunction. Mildly dilated left atrium. 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  . COPD (chronic obstructive pulmonary disease)     Past Surgical History  Procedure Laterality Date  . Pr vein bypass graft,aorto-fem-pop  2007    Left Fem-post. tibial  . Pr vein bypass graft,aorto-fem-pop  1999-2004    numerous revision Left Fem-pop  . Carotid endarterectomy  2002    Left CEA  . Carotid endarterectomy  2007    Left CEA redo  . Eye surgery Left 02-02-95    Cataract  . Eye surgery Right 04-20-95    Cataract  . Spine surgery  1980    Allergies  Allergen Reactions  . Lopressor [Metoprolol Tartrate] Palpitations    Current Outpatient Prescriptions  Medication Sig Dispense Refill  . acarbose (PRECOSE) 50 MG tablet Take by mouth 3 (three) times daily with meals. 0.5 tablet twice daily.      .Marland KitchenALPRAZolam (XANAX) 0.5 MG tablet Take 0.5 mg by mouth at bedtime as needed.      .Marland KitchenamLODipine (NORVASC) 5 MG tablet Take by mouth. Take 0.5 tablet daily      .  aspirin 81 MG tablet Take 81 mg by mouth daily.      Marland Kitchen atorvastatin (LIPITOR) 80 MG tablet Take 80 mg by mouth. Takes 0.5 tablet daily.      . cilostazol (PLETAL) 100 MG tablet Take 100 mg by mouth 2 (two) times daily.      . cloNIDine (CATAPRES) 0.1 MG tablet Take 0.1 mg by mouth 2 (two) times daily.      Marland Kitchen docusate sodium (COLACE) 100 MG capsule Take 100 mg by mouth 2 (two) times daily.      Marland Kitchen glipiZIDE (GLUCOTROL) 5 MG tablet Take 5 mg by mouth 2 (two) times daily before a meal.      . metoprolol tartrate (LOPRESSOR) 25 MG tablet TAKE 1 TABLET BY MOUTH  TWICE DAILY  180 tablet  3  . omega-3 acid ethyl esters (LOVAZA) 1 G capsule Take by mouth 2 (two) times daily. Takes 2 tablets      . oxycodone (ROXICODONE) 30 MG immediate release tablet Take 30 mg by mouth every 8 (eight) hours as needed.      . pantoprazole (PROTONIX) 40 MG tablet Take 40 mg by mouth 2 (two) times daily.       . propafenone (RYTHMOL) 225 MG tablet Take 225 mg by mouth 2 (two) times daily.      Marland Kitchen testosterone cypionate (DEPOTESTOTERONE CYPIONATE) 200 MG/ML injection Inject 100 mg into the skin every 14 (fourteen) days.       . Vitamin D, Ergocalciferol, (DRISDOL) 50000 UNITS CAPS capsule Take 50,000 Units by mouth every 7 (seven) days.      Marland Kitchen warfarin (COUMADIN) 3 MG tablet Take 4 mg by mouth daily.       . pioglitazone (ACTOS) 30 MG tablet Take 30 mg by mouth daily.       No current facility-administered medications for this visit.    Socially he is married and has 3 children 6 grandchildren and 5 great-grandchildren. He has a long-standing tobacco history and is still smoking one half pack per day. He does not drink alcohol  ROS General: Negative; No fevers, chills, or night sweats; positive for weight loss of approximately 10 pounds since January HEENT: Negative; No changes in vision or hearing, sinus congestion, difficulty swallowing Pulmonary: Negative; No cough, wheezing, hemoptysis Cardiovascular: Negative; No chest pain, presyncope, syncope, palpitations GI: Negative; No nausea, vomiting, diarrhea, or abdominal pain GU: Negative; No dysuria, hematuria, or difficulty voiding Musculoskeletal: Right wrist injury, for which he was in a brace today. no myalgias, joint pain, or weakness Hematologic/Oncology: Negative; no easy bruising, bleeding Endocrine: Negative; no heat/cold intolerance; no diabetes Neuro: Negative; no changes in balance, headaches Skin: Negative; No rashes or skin lesions Psychiatric: Negative; No behavioral problems, depression Sleep: Negative;  No snoring, daytime sleepiness, hypersomnolence, bruxism, restless legs, hypnogognic hallucinations, no cataplexy Other comprehensive 14 point system review is negative.   PE BP 162/60  Pulse 71  Ht 5' 9"  (1.753 m)  Wt 156 lb 8 oz (70.988 kg)  BMI 23.10 kg/m2  General: Alert, oriented, no distress.  Skin: normal turgor, no rashes HEENT: Normocephalic, atraumatic. Pupils round and reactive; sclera anicteric;no lid lag, no xanthelasmas Nose without nasal septal hypertrophy Mouth/Parynx benign; Mallinpatti scale 2  Neck: No JVD;  bilateral carotid bruits;  left greater than right Lungs: Diffusely decreased breath sounds without wheezing.; no wheezing or rales Heart: RRR, s1 s2 normal 1/6 systolic murmur; no rub, no S3 or S4 gallop. Chest wall with pectus excavatum. Abdomen: soft, nontender;  no hepatosplenomehaly, BS+; soft abdominal bruit;  abdominal aorta nontender and not dilated by palpation. Back: No CVA tenderness Pulses 2+ Extremities: Right wrist in a brace.  no clubbing cyanosis or edema, Homan's sign negative  Neurologic: grossly nonfocal Psychological: Normal affect and mood.  ECG (independently read by me): Sinus rhythm at 71 beats per minute.  Right bundle branch block with repolarization changes.  Q wave in V1 and V2.  ECG (independently read by me): Normal sinus rhythm. Normal intervals. No significant ST changes.  LABS:  BMET    Component Value Date/Time   NA 144 06/30/2007 0556   K 3.3* 06/30/2007 0556   CL 112 06/30/2007 0556   CO2 23 06/30/2007 0556   GLUCOSE 108* 06/30/2007 0556   BUN 22 06/30/2007 0556   CREATININE 1.45 06/30/2007 0556   CALCIUM 9.7 06/30/2007 0556   GFRNONAA 49* 06/30/2007 0556   GFRAA  Value: 59        The eGFR has been calculated using the MDRD equation. This calculation has not been validated in all clinical* 06/30/2007 0556     Hepatic Function Panel     Component Value Date/Time   PROT 6.0 06/26/2007 1830   ALBUMIN 3.2* 06/26/2007 1830    AST 35 06/26/2007 1830   ALT 23 06/26/2007 1830   ALKPHOS 24* 06/26/2007 1830   BILITOT 1.0 06/28/2007 2225   BILIDIR 0.2 06/26/2007 1830   IBILI 0.4 06/26/2007 1830     CBC    Component Value Date/Time   WBC 8.0 06/30/2007 0556   RBC 3.74* 06/30/2007 0556   RBC 3.25* 06/28/2007 2225   HGB 11.5 DELTA CHECK NOTED* 06/30/2007 0556   HCT 32.9* 06/30/2007 0556   PLT 220 06/30/2007 0556   MCV 88.0 06/30/2007 0556   MCHC 34.9 06/30/2007 0556   RDW 16.9* 06/30/2007 0556     BNP No results found for this basename: probnp    Lipid Panel  No results found for this basename: chol,  trig,  hdl,  cholhdl,  vldl,  ldlcalc     RADIOLOGY: No results found.    ASSESSMENT AND PLAN: Mr. Pettet is maintaining sinus rhythm on his current doses of rhythmol and metoprolol.  He does have right bundle branch block with repolarization changes.  QTc interval is 480 ms.   He's not having any breakthrough atrial fibrillation. I again discussed the importance of complete smoking cessation but I am very doubtful that this will be successful. Previously I discontinued his simvastatin since he also was on amlodipine and wanted more aggressive lipid lowering. He has tolerated atorvastatin and takes 40 mg daily. His blood pressure is well controlled on current therapy. He also is taking Pletal 100 mg daily for his lower extremity peripheral vascular disease. He  does not have significant claudication.  I did review recent vascular studies done by Dr. Kellie Simmering.  On 03/19/2014 his right ankle-brachial index was 0.58, left brachial index 0.98, right toe brachial index less than 0.69 and left toe brachial index less than 0.69.  His bilateral toe brachial indices since his previous study in June 2014 were improved.  I'm concerned about his recent weight loss.  He has lost 10 pounds since I initially saw him in January.  He does have a 50 year history of tobacco use.  I'm scheduling him for a PA and lateral chest x-ray for further  evaluation.  He will be following up with his primary physician.  It may be worthwhile to check stool  guaiacs.  If weight loss continues, despite him being unaware of change in bowel or bladder habits or stool color.  Troy Sine, MD, [facility]  08/08/2014 6:36 PM

## 2014-08-08 NOTE — Patient Instructions (Signed)
Chest xray [facility] [place] [address] today    Your physician wants you to follow-up in: 6 months. You will receive a reminder letter in the mail two months in advance. If you don't receive a letter, please call our office to schedule the follow-up appointment.

## 2014-08-16 ENCOUNTER — Telehealth: Payer: Self-pay | Admitting: *Deleted

## 2014-08-16 NOTE — Telephone Encounter (Signed)
Notified CXR WNL no changes in therapy needed at this time. She will notify patient.

## 2014-08-16 NOTE — Telephone Encounter (Signed)
Message copied by Gaynelle CageWADDELL, Kenesha Moshier M. on Fri Aug 16, 2014  5:40 PM ------      Message from: Nicki GuadalajaraKELLY, THOMAS A      Created: Sun Aug 11, 2014  9:50 AM       No acute process. Mild hyperexpansion and scarring; no mass ------

## 2014-12-16 ENCOUNTER — Other Ambulatory Visit (HOSPITAL_BASED_OUTPATIENT_CLINIC_OR_DEPARTMENT_OTHER): Payer: Self-pay | Admitting: Family Medicine

## 2014-12-16 ENCOUNTER — Ambulatory Visit (INDEPENDENT_AMBULATORY_CARE_PROVIDER_SITE_OTHER): Payer: Medicare Other

## 2014-12-16 DIAGNOSIS — I638 Other cerebral infarction: Secondary | ICD-10-CM

## 2014-12-16 DIAGNOSIS — R531 Weakness: Secondary | ICD-10-CM

## 2014-12-16 DIAGNOSIS — Z8673 Personal history of transient ischemic attack (TIA), and cerebral infarction without residual deficits: Secondary | ICD-10-CM

## 2014-12-23 ENCOUNTER — Encounter: Payer: Self-pay | Admitting: Vascular Surgery

## 2014-12-24 ENCOUNTER — Ambulatory Visit (HOSPITAL_COMMUNITY)
Admission: RE | Admit: 2014-12-24 | Discharge: 2014-12-24 | Disposition: A | Discharge status: Home / Self Care | Payer: Medicare Other | Source: Ambulatory Visit | Attending: Vascular Surgery | Admitting: Vascular Surgery

## 2014-12-24 ENCOUNTER — Encounter: Payer: Self-pay | Admitting: Vascular Surgery

## 2014-12-24 ENCOUNTER — Other Ambulatory Visit: Payer: Self-pay | Admitting: Family

## 2014-12-24 ENCOUNTER — Ambulatory Visit (INDEPENDENT_AMBULATORY_CARE_PROVIDER_SITE_OTHER): Payer: Medicare Other | Admitting: Vascular Surgery

## 2014-12-24 VITALS — BP 161/75 | HR 63 | Resp 16 | Ht 69.5 in | Wt 159.0 lb

## 2014-12-24 DIAGNOSIS — Z48812 Encounter for surgical aftercare following surgery on the circulatory system: Secondary | ICD-10-CM | POA: Diagnosis present

## 2014-12-24 DIAGNOSIS — I6523 Occlusion and stenosis of bilateral carotid arteries: Secondary | ICD-10-CM

## 2014-12-24 DIAGNOSIS — I739 Peripheral vascular disease, unspecified: Secondary | ICD-10-CM

## 2014-12-24 NOTE — Progress Notes (Signed)
Subjective:     Patient ID: [name], male   DOB: November 12, 1941, 74 y.o.   MRN: [id number]  HPI This 74 year old male is seen today for a recent small right brain CVA. Patient developed some mild left hemiparesis during the night and mild facial asymmetry. This is almost completely resolved. An MRI did show 2 small right brain CVAs. He has been followed for carotid disease for many years. He had a left carotid endarterectomy by me in 2001 and a redo left carotid endarterectomy by me at a later date. He also has had femoral to posterior tibial bypass grafting on the left for limb ischemia. He is on daily Coumadin and aspirin. He has had no new symptoms in his lower extremities.  Past Medical History  Diagnosis Date  . Anemia   . Arthritis   . Hypertension   . Diabetes mellitus   . Hyperlipidemia   . Carotid artery occlusion     Carotid endarterectomy 2002 and 2004  . GERD (gastroesophageal reflux disease)   . Anxiety   . PAD (peripheral artery disease)     Fem-Fem by-pass graft 1991. redo patch angioplasty in 2000 and 2004.  LEA dopplers: 04/2012- R-ABI 1.34, L-ABI 0.97, patent Left lower extremity graft.  . Barrett esophagus   . History of stress test     March 2012 post stress ejection fraction 56%, negative for ischemia.  Last 2-D echocardiogram March 2012 younger than 55% mild concentric left ventricular hypertrophy grade 1 diastolic dysfunction. Mildly dilated left atrium. 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  . COPD (chronic obstructive pulmonary disease)     History  Substance Use Topics  . Smoking status: Current Every Day Smoker -- 0.50 packs/day    Types: E-cigarettes  . Smokeless tobacco: Former NeurosurgeonUser    Types: Chew    Quit date: 05/21/1968  . Alcohol Use: No    Family History  Problem Relation Age of Onset  . Heart disease Father   . Heart attack Father     X's 3  . Hyperlipidemia Father   . Hypertension Father   . Diabetes Brother   . Hypertension Brother   . Cancer  Sister     Allergies  Allergen Reactions  . Lopressor [Metoprolol Tartrate] Palpitations     Current outpatient prescriptions:  .  ALPRAZolam (XANAX) 0.5 MG tablet, Take 0.5 mg by mouth at bedtime as needed., Disp: , Rfl:  .  amLODipine (NORVASC) 5 MG tablet, Take by mouth. Take 0.5 tablet daily, Disp: , Rfl:  .  aspirin 81 MG tablet, Take 81 mg by mouth daily., Disp: , Rfl:  .  atorvastatin (LIPITOR) 80 MG tablet, Take 80 mg by mouth. Takes 0.5 tablet daily., Disp: , Rfl:  .  cilostazol (PLETAL) 100 MG tablet, Take 100 mg by mouth 2 (two) times daily., Disp: , Rfl:  .  cloNIDine (CATAPRES) 0.1 MG tablet, Take 0.1 mg by mouth 2 (two) times daily., Disp: , Rfl:  .  docusate sodium (COLACE) 100 MG capsule, Take 100 mg by mouth 2 (two) times daily., Disp: , Rfl:  .  glipiZIDE (GLUCOTROL) 5 MG tablet, Take 5 mg by mouth 2 (two) times daily before a meal., Disp: , Rfl:  .  losartan (COZAAR) 100 MG tablet, Take 100 mg by mouth daily., Disp: , Rfl:  .  metoprolol tartrate (LOPRESSOR) 25 MG tablet, TAKE 1 TABLET BY MOUTH TWICE DAILY, Disp: 180 tablet, Rfl: 3 .  omega-3 acid ethyl esters (LOVAZA) 1 G capsule,  Take by mouth 2 (two) times daily. Takes 2 tablets, Disp: , Rfl:  .  oxycodone (ROXICODONE) 30 MG immediate release tablet, Take 30 mg by mouth every 8 (eight) hours as needed., Disp: , Rfl:  .  pantoprazole (PROTONIX) 40 MG tablet, Take 40 mg by mouth 2 (two) times daily. , Disp: , Rfl:  .  propafenone (RYTHMOL) 225 MG tablet, Take 225 mg by mouth 2 (two) times daily., Disp: , Rfl:  .  testosterone cypionate (DEPOTESTOTERONE CYPIONATE) 200 MG/ML injection, Inject 100 mg into the skin every 14 (fourteen) days. , Disp: , Rfl:  .  Vitamin D, Ergocalciferol, (DRISDOL) 50000 UNITS CAPS capsule, Take 50,000 Units by mouth every 7 (seven) days., Disp: , Rfl:  .  warfarin (COUMADIN) 3 MG tablet, Take 4 mg by mouth daily. , Disp: , Rfl:  .  acarbose (PRECOSE) 50 MG tablet, Take by mouth 3 (three)  times daily with meals. 0.5 tablet twice daily., Disp: , Rfl:  .  pioglitazone (ACTOS) 30 MG tablet, Take 30 mg by mouth daily., Disp: , Rfl:   BP 161/75 mmHg  Pulse 63  Resp 16  Ht 5' 9.5" (1.765 m)  Wt 159 lb (72.122 kg)  BMI 23.15 kg/m2  Body mass index is 23.15 kg/(m^2).           Review of Systems Denies chest pain, dyspnea on exertion, PND, orthopnea, hemoptysis, claudication. Also denies a faint, amaurosis fugax, diplopia, syncope.     Objective:   Physical Exam BP 161/75 mmHg  Pulse 63  Resp 16  Ht 5' 9.5" (1.765 m)  Wt 159 lb (72.122 kg)  BMI 23.15 kg/m2   Gen. Well-developed well-nourished male no apparent stress alert and oriented 3 Lungs no rhonchi or wheezing Cardiovascular rhythm no murmurs Left leg with subcutaneously placed femoral to posterior tibial graft with 3+ pulse both feet well perfused Carotid pulses are 3+ bilaterally with harsh bruit on the left softer bruit on the right.    today I ordered a carotid duplex exam which I reviewed and interpreted. Left carotid endarterectomy site is widely patent. There is a severe left external carotid stenosis. On the right side there is a moderately severe right external carotid stenosis and a mild right internal carotid stenosis in the 40 percent range.     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

## 2014-12-25 NOTE — Addendum Note (Signed)
Addended by: Sharee PimpleMCCHESNEY, Aseret Hoffman K on: 12/25/2014 10:58 AM   Modules accepted: Orders

## 2015-01-03 ENCOUNTER — Ambulatory Visit (INDEPENDENT_AMBULATORY_CARE_PROVIDER_SITE_OTHER): Payer: Medicare Other | Admitting: Physician Assistant

## 2015-01-03 ENCOUNTER — Encounter: Payer: Self-pay | Admitting: Physician Assistant

## 2015-01-03 VITALS — BP 156/62 | HR 64 | Ht 69.5 in | Wt 153.5 lb

## 2015-01-03 DIAGNOSIS — I10 Essential (primary) hypertension: Secondary | ICD-10-CM

## 2015-01-03 DIAGNOSIS — Z7901 Long term (current) use of anticoagulants: Secondary | ICD-10-CM

## 2015-01-03 DIAGNOSIS — I4891 Unspecified atrial fibrillation: Secondary | ICD-10-CM

## 2015-01-03 DIAGNOSIS — I480 Paroxysmal atrial fibrillation: Secondary | ICD-10-CM

## 2015-01-03 DIAGNOSIS — Z5181 Encounter for therapeutic drug level monitoring: Secondary | ICD-10-CM

## 2015-01-03 DIAGNOSIS — I639 Cerebral infarction, unspecified: Secondary | ICD-10-CM | POA: Insufficient documentation

## 2015-01-03 NOTE — Progress Notes (Signed)
Patient ID: [name], male   DOB: 20-Jun-1941, 74 y.o.   MRN: [id number]    Date:  01/03/2015   ID:  [name] Ballerrchie L Kuhnle, DOB 20-Jun-1941, MRN [id number]  PCP:  Lenora BoysFRIED, ROBERT L, MD  Primary Cardiologist:  Tresa EndoKelly  Chief Complaint  Patient presents with  . Follow-up    Recent CVA :  Occas. palpitations. States it was out of rhythm the morning he woke up with the stroke symptoms.     History of Present Illness: [name] Ballerrchie L Stockard is a 74 y.o. male who  has established vascular disease and is status post left fem-fem bypass surgery 1991, redo patch angioplasty graft 2000 with another redo 2004. In 2002 in 2004 he underwent carotid endarterectomies. Additional problems include paroxysmal atrial fibrillation, COPD, hypertension, anemia, type 2 diabetes mellitus, transient renal insufficiency, Barrett's esophagus, hiatal hernia, remote history of duodenal ulcer, and remote history of kidney stones.  Over the past 8 months, he is unaware of any breakthrough arrhythmia. He is on Coumadin anticoagulation. He does have difficulty with his back. He does not routinely exercise. Unfortunately, he continues smoke cigarettes, currently one half pack per day.  He has been on Rhythmol for his paroxysmal atrial fibrillation. He has been on lipid-lowering therapy with atorvastatin 80 mg. His blood pressure has been treated with amlodipine 5 mg Catapres patch, metoprolol 25 mg twice a day. He is diabetic on Glucotrol 5 mg twice a day and Actos 30 mg. He continues to tolerate Protonix 40 mg for his history of Barrett's esophagus.  The patient was seen by Dr. Tresa EndoKelly in September he admited to significant weight loss in the year prior despite trying to. He does have approximate 50 to tobacco use. Chest x-ray after that visit showed no masses or nodules.  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  Patient states that the day of his stroke he woke up and his heart was out of rhythm.  Patient's wife states that his last INR was 2.1 and for the last several months it has been therapeutic.  The patient is under regular physical therapy since his stroke and has noticed improvements. He still has some residual weakness particularly in his fingers as far as grip strength. He does have chronic back pain for which he takes oxycodone.  The patient currently denies nausea, vomiting, fever, chest pain, shortness of breath, orthopnea, dizziness, PND, cough, congestion, abdominal pain, hematochezia, melena, lower extremity edema, claudication.  Wt Readings from Last 3 Encounters:  01/03/15 153 lb 8 oz (69.627 kg)  12/24/14 159 lb (72.122 kg)  08/08/14 156 lb 8 oz (70.988 kg)     Past Medical History  Diagnosis Date  . Anemia   . Arthritis   . Hypertension   . Diabetes mellitus   . Hyperlipidemia   . Carotid artery occlusion     Carotid endarterectomy 2002 and 2004  . GERD (gastroesophageal reflux disease)   . Anxiety   . PAD (peripheral artery disease)     Fem-Fem by-pass graft 1991. redo patch angioplasty in 2000 and 2004.  LEA dopplers: 04/2012- R-ABI 1.34, L-ABI 0.97, patent Left lower extremity graft.  . Barrett esophagus   . History of stress test     March 2012 post stress ejection fraction 56%, negative for ischemia.  Last 2-D echocardiogram March 2012 younger than 55% mild concentric left  ventricular hypertrophy grade 1 diastolic dysfunction. Mildly dilated left atrium. 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  . COPD (chronic obstructive pulmonary disease)     Current Outpatient Prescriptions  Medication Sig Dispense Refill  . acarbose (PRECOSE) 50 MG tablet Take by mouth 3 (three) times daily with meals. 0.5 tablet twice daily.    Marland Kitchen ALPRAZolam (XANAX) 0.5 MG tablet Take 0.5 mg by mouth at bedtime as needed.    Marland Kitchen amLODipine (NORVASC) 5 MG tablet Take by mouth. Take 0.5 tablet daily      . aspirin 81 MG tablet Take 81 mg by mouth daily.    Marland Kitchen atorvastatin (LIPITOR) 80 MG tablet Take 80 mg by mouth. Takes 0.5 tablet daily.    . cilostazol (PLETAL) 100 MG tablet Take 100 mg by mouth 2 (two) times daily.    . cloNIDine (CATAPRES) 0.1 MG tablet Take 0.1 mg by mouth 2 (two) times daily.    Marland Kitchen docusate sodium (COLACE) 100 MG capsule Take 100 mg by mouth 2 (two) times daily.    Marland Kitchen glipiZIDE (GLUCOTROL) 5 MG tablet Take 5 mg by mouth 2 (two) times daily before a meal.    . losartan (COZAAR) 100 MG tablet Take 100 mg by mouth daily.    . metoprolol tartrate (LOPRESSOR) 25 MG tablet TAKE 1 TABLET BY MOUTH TWICE DAILY 180 tablet 3  . omega-3 acid ethyl esters (LOVAZA) 1 G capsule Take by mouth 2 (two) times daily. Takes 2 tablets    . oxycodone (ROXICODONE) 30 MG immediate release tablet Take 30 mg by mouth every 8 (eight) hours as needed.    . pantoprazole (PROTONIX) 40 MG tablet Take 40 mg by mouth 2 (two) times daily.     . propafenone (RYTHMOL) 225 MG tablet Take 225 mg by mouth 2 (two) times daily.    Marland Kitchen testosterone cypionate (DEPOTESTOTERONE CYPIONATE) 200 MG/ML injection Inject 100 mg into the skin every 14 (fourteen) days.     . Vitamin D, Ergocalciferol, (DRISDOL) 50000 UNITS CAPS capsule Take 50,000 Units by mouth every 7 (seven) days.    Marland Kitchen warfarin (COUMADIN) 3 MG tablet Take 4 mg by mouth daily.      No current facility-administered medications for this visit.    Allergies:    Allergies  Allergen Reactions  . Lopressor [Metoprolol Tartrate] Palpitations    Social History:  The patient  reports that he has been smoking E-cigarettes.  He has been smoking about 0.50 packs per day. He quit smokeless tobacco use about 46 years ago. His smokeless tobacco use included Chew. He reports that he does not drink alcohol or use illicit drugs.   Family history:   Family History  Problem Relation Age of Onset  . Heart disease Father   . Heart attack Father     X's 3  .  Hyperlipidemia Father   . Hypertension Father   . Diabetes Brother   . Hypertension Brother   . Cancer Sister     ROS:  Please see the history of present illness.  All other systems reviewed and negative.   PHYSICAL EXAM: VS:  BP 156/62 mmHg  Pulse 64  Ht 5' 9.5" (1.765 m)  Wt 153 lb 8 oz (69.627 kg)  BMI 22.35 kg/m2 Well nourished, well developed, in no acute distress HEENT: Pupils are equal round react to light accommodation extraocular movements are intact.  Neck: no JVDNo cervical lymphadenopathy. Cardiac: Regular rate and rhythm without murmurs rubs or gallops. Lungs:  clear to auscultation bilaterally,  no wheezing, rhonchi or rales Abd: soft, nontender, positive bowel sounds all quadrants, no hepatosplenomegaly Ext: no lower extremity edema.  2+ radial and dorsalis pedis pulses. Skin: warm and dry Neuro:  Decreased grip strength in the left compared to the right. Arm flexion and leg extensions. Equal  EKG:  Sinus rhythm with first-degree AV block. Rate 64 bpm PR interval 216 ms.   ASSESSMENT AND PLAN:  Problem List Items Addressed This Visit    Paroxysmal atrial fibrillation - Primary    Patient is currently taking per path unknown and metoprolol. He is reported to several episodes sometimes daily where his heart is out of rhythm. He can feel it in his pulse.  We'll put him on a seven-day CardioNet to see what the atrial fib burden is he's been therapeutic on his INRs. Follow-up in 3 months with Dr. Tresa Endo. Depending on the results of the CardioNet we may alter his follow-up appointment.  The patient appears euvolemic without any signs of heart failure. Given his therapeutic INRs, I don't think we need to do an echocardiogram      Essential hypertension    Blood pressure is elevated today however, the patient does experience chronic back pain particularly with ambulation, which is likely to drive up his blood pressure. No changes in therapy.      CVA (cerebral vascular  accident) February 2016   Anticoagulated on Coumadin    INR subtherapeutic the last several months.

## 2015-01-03 NOTE — Assessment & Plan Note (Addendum)
Patient is currently taking per path unknown and metoprolol. He is reported to several episodes sometimes daily where his heart is out of rhythm. He can feel it in his pulse.  We'll put him on a seven-day CardioNet to see what the atrial fib burden is he's been therapeutic on his INRs. Follow-up in 3 months with Dr. Tresa EndoKelly. Depending on the results of the CardioNet we may alter his follow-up appointment.  The patient appears euvolemic without any signs of heart failure. Given his therapeutic INRs, I don't think we need to do an echocardiogram

## 2015-01-03 NOTE — Assessment & Plan Note (Signed)
Blood pressure is elevated today however, the patient does experience chronic back pain particularly with ambulation, which is likely to drive up his blood pressure. No changes in therapy.

## 2015-01-03 NOTE — Assessment & Plan Note (Signed)
INR subtherapeutic the last several months.

## 2015-01-03 NOTE — Patient Instructions (Signed)
Your physician has recommended that you wear an event monitor for one week. Event monitors are medical devices that record the heart's electrical activity. Doctors most often us these monitors to diagnose arrhythmias. Arrhythmias are problems with the speed or rhythm of the heartbeat. The monitor is a small, portable device. You can wear one while you do your normal daily activities. This is usually used to diagnose what is causing palpitations/syncope (passing out).  WE WILL CALL YOU WITH THE RESULTS.  Your physician recommends that you schedule a follow-up appointment in: 3 MONTHS WITH DR. Tresa EndoKELLY.

## 2015-01-14 DIAGNOSIS — I639 Cerebral infarction, unspecified: Secondary | ICD-10-CM

## 2015-01-14 HISTORY — DX: Cerebral infarction, unspecified: I63.9

## 2015-01-21 ENCOUNTER — Ambulatory Visit: Payer: Medicare Other | Admitting: Vascular Surgery

## 2015-01-21 ENCOUNTER — Other Ambulatory Visit (HOSPITAL_COMMUNITY): Payer: Medicare Other

## 2015-01-21 ENCOUNTER — Encounter (HOSPITAL_COMMUNITY): Payer: Medicare Other

## 2015-01-29 NOTE — Addendum Note (Signed)
Addended by: Lindell SparELKINS, JENNA M on: 01/29/2015 04:57 PM   Modules accepted: Orders

## 2015-02-14 ENCOUNTER — Ambulatory Visit: Payer: Medicare Other | Admitting: Cardiovascular Disease

## 2015-02-25 ENCOUNTER — Ambulatory Visit (INDEPENDENT_AMBULATORY_CARE_PROVIDER_SITE_OTHER): Payer: Medicare Other | Admitting: Cardiovascular Disease

## 2015-02-25 VITALS — BP 130/62 | HR 66 | Ht 69.5 in | Wt 155.7 lb

## 2015-02-25 DIAGNOSIS — I639 Cerebral infarction, unspecified: Secondary | ICD-10-CM | POA: Diagnosis not present

## 2015-02-25 DIAGNOSIS — Z7901 Long term (current) use of anticoagulants: Secondary | ICD-10-CM

## 2015-02-25 DIAGNOSIS — Z5181 Encounter for therapeutic drug level monitoring: Secondary | ICD-10-CM

## 2015-02-25 DIAGNOSIS — R634 Abnormal weight loss: Secondary | ICD-10-CM

## 2015-02-25 DIAGNOSIS — I480 Paroxysmal atrial fibrillation: Secondary | ICD-10-CM | POA: Diagnosis not present

## 2015-02-25 DIAGNOSIS — I10 Essential (primary) hypertension: Secondary | ICD-10-CM

## 2015-02-25 DIAGNOSIS — I739 Peripheral vascular disease, unspecified: Secondary | ICD-10-CM | POA: Diagnosis not present

## 2015-02-25 NOTE — Patient Instructions (Signed)
Your physician wants you to follow-up in: 1 year or sooner if needed with Dr. Kelly. You will receive a reminder letter in the mail two months in advance. If you don't receive a letter, please call our office to schedule the follow-up appointment. 

## 2015-02-27 ENCOUNTER — Encounter: Payer: Self-pay | Admitting: Cardiovascular Disease

## 2015-02-27 NOTE — Progress Notes (Signed)
Patient ID: [name], male   DOB: 1941/04/20, 74 y.o.   MRN: [id number]   HPI: [name] is a 74 y.o. male who presents to the office today for a 28-monthcardiology evaluation.   Mr. NCrescenzohas established PVD and is status post left fem-fem bypass surgery 1991, redo patch angioplasty graft 2000 with another redo 2004. In 2002 in 2004 he underwent carotid endarterectomies. Additional problems include paroxysmal atrial fibrillation, COPD, hypertension, anemia, type 2 diabetes mellitus, transient renal insufficiency, Barrett's esophagus, hiatal hernia, remote history of duodenal ulcer, and remote history of kidney stones.  He is unaware of any breakthrough arrhythmia. He is on Coumadin anticoagulation. He does have difficulty with his back. He does not routinely exercise. Unfortunately, he continues smoke cigarettes, currently one half pack per day.  He denies recent chest pain. He denies any wheezing exacerbation. He has been on Rhythmol for his paroxysmal atrial fibrillation. He has been on lipid-lowering therapy with atorvastatin 80 mg. His blood pressure has been treated with amlodipine 5 mg Catapres patch, metoprolol 25 mg twice a day. He is diabetic on Glucotrol 5 mg twice a day and Actos 30 mg. He continues to tolerate Protonix 40 mg for his history of Barrett's esophagus.  He tells me that since I last saw him, he was referred by Dr. freed to Dr. lLeanne Lovelyday.  She of at [facility] for renal evaluation.  Past Medical History  Diagnosis Date  . Anemia   . Arthritis   . Hypertension   . Diabetes mellitus   . Hyperlipidemia   . Carotid artery occlusion     Carotid endarterectomy 2002 and 2004  . GERD (gastroesophageal reflux disease)   . Anxiety   . PAD (peripheral artery disease)     Fem-Fem by-pass graft 1991. redo patch angioplasty in 2000 and 2004.  LEA dopplers: 04/2012- R-ABI 1.34, L-ABI 0.97, patent Left lower extremity graft.  . Barrett esophagus   . History of stress test       March 2012 post stress ejection fraction 56%, negative for ischemia.  Last 2-D echocardiogram March 2012 younger than 55% mild concentric left ventricular hypertrophy grade 1 diastolic dysfunction. Mildly dilated left atrium. 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  . COPD (chronic obstructive pulmonary disease)     Past Surgical History  Procedure Laterality Date  . Pr vein bypass graft,aorto-fem-pop  2007    Left Fem-post. tibial  . Pr vein bypass graft,aorto-fem-pop  1999-2004    numerous revision Left Fem-pop  . Carotid endarterectomy  2002    Left CEA  . Carotid endarterectomy  2007    Left CEA redo  . Eye surgery Left 02-02-95    Cataract  . Eye surgery Right 04-20-95    Cataract  . Spine surgery  1980    Allergies  Allergen Reactions  . Lopressor [Metoprolol Tartrate] Palpitations    Current Outpatient Prescriptions  Medication Sig Dispense Refill  . acarbose (PRECOSE) 50 MG tablet Take by mouth 3 (three) times daily with meals. 0.5 tablet twice daily.    .Marland KitchenALPRAZolam (XANAX) 0.5 MG tablet Take 0.5 mg by mouth at bedtime as needed.    .Marland KitchenamLODipine (NORVASC) 5 MG tablet Take by mouth. Take 0.5 tablet daily    . aspirin 81 MG tablet Take 81 mg by mouth daily.    .Marland Kitchenatorvastatin (LIPITOR) 80 MG tablet Take 80 mg by mouth. Takes 0.5 tablet daily.    . cilostazol (PLETAL) 100 MG tablet Take 100 mg  by mouth 2 (two) times daily.    . cloNIDine (CATAPRES) 0.3 MG tablet Take 1 tablet by mouth 2 (two) times daily.  6  . docusate sodium (COLACE) 100 MG capsule Take 100 mg by mouth 2 (two) times daily.    Marland Kitchen glipiZIDE (GLUCOTROL) 5 MG tablet Take 5 mg by mouth 2 (two) times daily before a meal.    . losartan (COZAAR) 100 MG tablet Take 100 mg by mouth daily.    . metoprolol tartrate (LOPRESSOR) 25 MG tablet TAKE 1 TABLET BY MOUTH TWICE DAILY 180 tablet 3  . omega-3 acid ethyl esters (LOVAZA) 1 G capsule Take by mouth 2 (two) times daily. Takes 2 tablets    . oxycodone (ROXICODONE) 30 MG  immediate release tablet Take 30 mg by mouth every 8 (eight) hours as needed.    . pantoprazole (PROTONIX) 40 MG tablet Take 40 mg by mouth 2 (two) times daily.     . propafenone (RYTHMOL) 225 MG tablet Take 225 mg by mouth 2 (two) times daily.    Marland Kitchen testosterone cypionate (DEPOTESTOTERONE CYPIONATE) 200 MG/ML injection Inject 100 mg into the skin every 14 (fourteen) days.     . Vitamin D, Ergocalciferol, (DRISDOL) 50000 UNITS CAPS capsule Take 50,000 Units by mouth every 7 (seven) days.    Marland Kitchen warfarin (COUMADIN) 3 MG tablet As Directed by INR     No current facility-administered medications for this visit.    Socially he is married and has 3 children 6 grandchildren and 5 great-grandchildren. He has a long-standing tobacco history and is still smoking one half pack per day.  Has smoked for 50 years He does not drink alcohol  ROS General: Negative; No fevers, chills, or night sweats; positive for weight loss of approximately 10 pounds since January HEENT: Negative; No changes in vision or hearing, sinus congestion, difficulty swallowing Pulmonary: Negative; No cough, wheezing, hemoptysis Cardiovascular: Negative; No chest pain, presyncope, syncope, palpitations GI: Negative; No nausea, vomiting, diarrhea, or abdominal pain GU: Negative; No dysuria, hematuria, or difficulty voiding Musculoskeletal: Right wrist injury, for which he was in a brace today. no myalgias, joint pain, or weakness Hematologic/Oncology: Negative; no easy bruising, bleeding Endocrine: Negative; no heat/cold intolerance; no diabetes Neuro: Negative; no changes in balance, headaches Skin: Negative; No rashes or skin lesions Psychiatric: Negative; No behavioral problems, depression Sleep: Negative; No snoring, daytime sleepiness, hypersomnolence, bruxism, restless legs, hypnogognic hallucinations, no cataplexy Other comprehensive 14 point system review is negative.   PE BP 130/62 mmHg  Pulse 66  Ht 5' 9.5" (1.765 m)   Wt 155 lb 11.2 oz (70.625 kg)  BMI 22.67 kg/m2  General: Alert, oriented, no distress.  Skin: normal turgor, no rashes HEENT: Normocephalic, atraumatic. Pupils round and reactive; sclera anicteric;no lid lag, no xanthelasmas Nose without nasal septal hypertrophy Mouth/Parynx benign; Mallinpatti scale 2  Neck: No JVD;  bilateral carotid bruits;  left greater than right Lungs: Diffusely decreased breath sounds without wheezing.; no wheezing or rales Heart: RRR, s1 s2 normal 1/6 systolic murmur; no rub, no S3 or S4 gallop. Chest wall: pectus excavatum. Abdomen: soft, nontender; no hepatosplenomehaly, BS+; soft abdominal bruit;  abdominal aorta nontender and not dilated by palpation. Back: No CVA tenderness Pulses 2+ Extremities: Right wrist in a brace.  no clubbing cyanosis or edema, Homan's sign negative  Neurologic: grossly nonfocal Psychological: Normal affect and mood.   ECG (independently read by me): Normal sinus rhythm at 66 bpm.  First degree block.  September 2015 ECG (independently  read by me): Sinus rhythm at 71 beats per minute.  Right bundle branch block with repolarization changes.  Q wave in V1 and V2.  ECG (independently read by me): Normal sinus rhythm. Normal intervals. No significant ST changes.  LABS:  BMET    Component Value Date/Time   NA 144 06/30/2007 0556   K 3.3* 06/30/2007 0556   CL 112 06/30/2007 0556   CO2 23 06/30/2007 0556   GLUCOSE 108* 06/30/2007 0556   BUN 22 06/30/2007 0556   CREATININE 1.45 06/30/2007 0556   CALCIUM 9.7 06/30/2007 0556   GFRNONAA 49* 06/30/2007 0556   GFRAA * 06/30/2007 0556    59        The eGFR has been calculated using the MDRD equation. This calculation has not been validated in all clinical     Hepatic Function Panel     Component Value Date/Time   PROT 6.0 06/26/2007 1830   ALBUMIN 3.2* 06/26/2007 1830   AST 35 06/26/2007 1830   ALT 23 06/26/2007 1830   ALKPHOS 24* 06/26/2007 1830   BILITOT 1.0 06/28/2007  2225   BILIDIR 0.2 06/26/2007 1830   IBILI 0.4 06/26/2007 1830     CBC    Component Value Date/Time   WBC 8.0 06/30/2007 0556   RBC 3.74* 06/30/2007 0556   RBC 3.25* 06/28/2007 2225   HGB 11.5 DELTA CHECK NOTED* 06/30/2007 0556   HCT 32.9* 06/30/2007 0556   PLT 220 06/30/2007 0556   MCV 88.0 06/30/2007 0556   MCHC 34.9 06/30/2007 0556   RDW 16.9* 06/30/2007 0556     BNP No results found for: PROBNP  Lipid Panel  No results found for: CHOL   RADIOLOGY: No results found.    ASSESSMENT AND PLAN: Mr. Palma is a 89 year old gentleman with a 50 year history of tobacco use who continues to smoke.  He has a history of paroxysmal atrial fibrillation and  is maintaining sinus rhythm on his current doses of rhythmol and metoprolol.   He's not having any breakthrough atrial fibrillation. I again discussed the importance of complete smoking cessation but I am very doubtful that this will be successful. Previously I had discontinued his simvastatin since he also was on amlodipine and wanted more aggressive lipid lowering. He has tolerated atorvastatin and is tolerating his current dose of 40 mg daily.  His blood pressure is well controlled on current therapy. He also is taking Pletal 100 mg daily for his lower extremity peripheral vascular disease. He  does not have significant claudication.  I did review recent vascular studies done by Dr. Kellie Simmering.  On 03/19/2014 his right ankle-brachial index was 0.58, left brachial index 0.98, right toe brachial index less than 0.69 and left toe brachial index less than 0.69.  His bilateral toe brachial indices since his previous study in June 2014 were improved.  His weight today is 155 pounds, which places him in a body mass index of 22.7 kg/m.  Previously he had lost weight but this has been stable for the past 9 months.  He's not having any chest pain symptoms.Marland Kitchen  He will be having follow-up laboratory with Dr. freed.  I will try to obtain these results.  As  long as he remains clinically stable, I will see him in one year for reevaluation but am available sooner if problem arise.  Time spent: 25 minutes  Troy Sine, MD, [facility]  02/27/2015 6:21 PM

## 2015-03-20 ENCOUNTER — Other Ambulatory Visit (HOSPITAL_COMMUNITY): Payer: Medicare Other

## 2015-03-20 ENCOUNTER — Ambulatory Visit: Payer: Medicare Other | Admitting: Family

## 2015-03-20 ENCOUNTER — Encounter (HOSPITAL_COMMUNITY): Payer: Medicare Other

## 2015-03-25 ENCOUNTER — Other Ambulatory Visit (HOSPITAL_COMMUNITY): Payer: Medicare Other

## 2015-03-25 ENCOUNTER — Ambulatory Visit: Payer: Medicare Other | Admitting: Vascular Surgery

## 2015-03-25 ENCOUNTER — Encounter (HOSPITAL_COMMUNITY): Payer: Medicare Other

## 2015-03-31 ENCOUNTER — Other Ambulatory Visit: Payer: Self-pay | Admitting: Cardiovascular Disease

## 2015-04-15 ENCOUNTER — Other Ambulatory Visit: Payer: Self-pay | Admitting: Cardiovascular Disease

## 2015-04-16 NOTE — Telephone Encounter (Signed)
Rx(s) sent to pharmacy electronically.  

## 2015-06-20 ENCOUNTER — Encounter: Payer: Self-pay | Admitting: Family

## 2015-06-23 ENCOUNTER — Encounter: Payer: Self-pay | Admitting: Family

## 2015-06-23 ENCOUNTER — Ambulatory Visit (HOSPITAL_COMMUNITY)
Admission: RE | Admit: 2015-06-23 | Discharge: 2015-06-23 | Disposition: A | Discharge status: Home / Self Care | Payer: Medicare Other | Source: Ambulatory Visit | Attending: Family | Admitting: Family

## 2015-06-23 ENCOUNTER — Other Ambulatory Visit: Payer: Self-pay | Admitting: Vascular Surgery

## 2015-06-23 ENCOUNTER — Ambulatory Visit (INDEPENDENT_AMBULATORY_CARE_PROVIDER_SITE_OTHER)
Admission: RE | Admit: 2015-06-23 | Discharge: 2015-06-23 | Disposition: A | Discharge status: Home / Self Care | Payer: Medicare Other | Source: Ambulatory Visit | Attending: Vascular Surgery | Admitting: Vascular Surgery

## 2015-06-23 ENCOUNTER — Ambulatory Visit (INDEPENDENT_AMBULATORY_CARE_PROVIDER_SITE_OTHER): Payer: Medicare Other | Admitting: Family

## 2015-06-23 VITALS — BP 137/66 | HR 54 | Temp 97.5°F | Resp 16 | Ht 69.5 in | Wt 149.0 lb

## 2015-06-23 DIAGNOSIS — Z9889 Other specified postprocedural states: Secondary | ICD-10-CM | POA: Diagnosis not present

## 2015-06-23 DIAGNOSIS — I739 Peripheral vascular disease, unspecified: Secondary | ICD-10-CM

## 2015-06-23 DIAGNOSIS — Z720 Tobacco use: Secondary | ICD-10-CM

## 2015-06-23 DIAGNOSIS — I6523 Occlusion and stenosis of bilateral carotid arteries: Secondary | ICD-10-CM | POA: Diagnosis not present

## 2015-06-23 DIAGNOSIS — Z95828 Presence of other vascular implants and grafts: Secondary | ICD-10-CM | POA: Insufficient documentation

## 2015-06-23 DIAGNOSIS — Z789 Other specified health status: Secondary | ICD-10-CM | POA: Diagnosis not present

## 2015-06-23 DIAGNOSIS — I639 Cerebral infarction, unspecified: Secondary | ICD-10-CM | POA: Diagnosis not present

## 2015-06-23 DIAGNOSIS — Z48812 Encounter for surgical aftercare following surgery on the circulatory system: Secondary | ICD-10-CM | POA: Diagnosis not present

## 2015-06-23 NOTE — Patient Instructions (Signed)

## 2015-06-23 NOTE — Progress Notes (Signed)
Filed Vitals:   06/23/15 1330 06/23/15 1334 06/23/15 1346 06/23/15 1348  BP: 148/71 152/69 139/68 137/66  Pulse: 62 58 55 54  Temp:  97.5 F (36.4 C)    TempSrc:  Oral    Resp:  16    Height:  5' 9.5" (1.765 m)    Weight:  149 lb (67.586 kg)    SpO2:  100%

## 2015-06-23 NOTE — Progress Notes (Signed)
[facility] [place] HISTORY AND PHYSICAL   MRN : [id number]  History of Present Illness:   [name] is a 74 y.o. male patient of Dr. Hart [place] who returns for continued followup regarding his left lower extremity femoral to posterior tibial bypass which was done using composite bilateral cephalic vein grafts in 2007. He has had previous left carotid endarterectomy and redo left carotid endarterectomy most recent of which was 2007.  He denies any specific claudication symptoms but is able to walk one block and then stops because of fatigue. His biggest problem is severe back discomfort which is chronic in nature.   He had a mild stroke in March 2016 while on coumadin as manifested by weakness in his left hand and foot, and drawing of the left corner of his mouth; these symptoms lasted about 2 weeks. He was evaluated by MRI of his head as requested by his PCP, per pt. He had physical therapy for this. He denies any subsequent strokes or TIA's.   Pt denies any claudication symptoms with walking, but does have occasional left lateral calf pain which he attributes to low back issues, he takes oxycodone for back pain. His walking is limited by back pain. Pt denies any non healing wounds.  He used to have his carotid Duplex checked in Dr. Kandis Cocking office, but was last checked in this office July, 2014: <40% right ICA stenosis, widely patent left ICA. Bilateral ECA stenosis.   Pt Diabetic: Yes, states in good control Pt smoker: smoker (using e-cigarettes, he does not know if it has nicotine)  Pt meds include:  Statin :Yes Betablocker: Yes ASA: Yes Other anticoagulants/antiplatelets: taking coumadin for PAD     Current Outpatient Prescriptions  Medication Sig Dispense Refill  . acarbose (PRECOSE) 50 MG tablet Take by mouth 3 (three) times daily with meals. 0.5 tablet twice daily.    Marland Kitchen ALPRAZolam (XANAX) 0.5 MG tablet Take 0.5 mg by mouth at bedtime as needed.     Marland Kitchen amLODipine (NORVASC) 5 MG tablet Take by mouth. Take 0.5 tablet daily    . aspirin 81 MG tablet Take 81 mg by mouth daily.    Marland Kitchen atorvastatin (LIPITOR) 80 MG tablet Take 80 mg by mouth. Takes 0.5 tablet daily.    . cilostazol (PLETAL) 100 MG tablet Take 100 mg by mouth 2 (two) times daily.    . cloNIDine (CATAPRES) 0.3 MG tablet Take 1 tablet by mouth 2 (two) times daily.  6  . docusate sodium (COLACE) 100 MG capsule Take 100 mg by mouth 2 (two) times daily.    Marland Kitchen glipiZIDE (GLUCOTROL) 5 MG tablet Take 5 mg by mouth 2 (two) times daily before a meal.    . losartan (COZAAR) 100 MG tablet Take 100 mg by mouth daily.    . metoprolol tartrate (LOPRESSOR) 25 MG tablet TAKE 1 TABLET BY MOUTH TWICE DAILY 180 tablet 2  . omega-3 acid ethyl esters (LOVAZA) 1 G capsule Take by mouth 2 (two) times daily. Takes 2 tablets    . oxycodone (ROXICODONE) 30 MG immediate release tablet Take 30 mg by mouth every 8 (eight) hours as needed.    . pantoprazole (PROTONIX) 40 MG tablet Take 40 mg by mouth 2 (two) times daily.     . propafenone (RYTHMOL) 225 MG tablet Take 225 mg by mouth 2 (two) times daily.    . propafenone (RYTHMOL) 225 MG tablet TAKE 1 TABLET BY MOUTH EVERY 8 HOURS 90 tablet 3  .  testosterone cypionate (DEPOTESTOTERONE CYPIONATE) 200 MG/ML injection Inject 100 mg into the skin every 14 (fourteen) days.     . Vitamin D, Ergocalciferol, (DRISDOL) 50000 UNITS CAPS capsule Take 50,000 Units by mouth every 7 (seven) days.    Marland Kitchen warfarin (COUMADIN) 3 MG tablet As Directed by INR     No current facility-administered medications for this visit.    Past Medical History  Diagnosis Date  . Anemia   . Arthritis   . Hypertension   . Diabetes mellitus   . Hyperlipidemia   . Carotid artery occlusion     Carotid endarterectomy 2002 and 2004  . GERD (gastroesophageal reflux disease)   . Anxiety   . PAD (peripheral artery disease)     Fem-Fem by-pass graft 1991. redo patch angioplasty in 2000 and 2004.   LEA dopplers: 04/2012- R-ABI 1.34, L-ABI 0.97, patent Left lower extremity graft.  . Barrett esophagus   . History of stress test     March 2012 post stress ejection fraction 56%, negative for ischemia.  Last 2-D echocardiogram March 2012 younger than 55% mild concentric left ventricular hypertrophy grade 1 diastolic dysfunction. Mildly dilated left atrium. 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  . COPD (chronic obstructive pulmonary disease)     Social History History  Substance Use Topics  . Smoking status: Current Every Day Smoker -- 0.50 packs/day    Types: E-cigarettes  . Smokeless tobacco: Former Neurosurgeon    Types: Chew    Quit date: 05/21/1968  . Alcohol Use: No    Family History Family History  Problem Relation Age of Onset  . Heart disease Father   . Heart attack Father     X's 3  . Hyperlipidemia Father   . Hypertension Father   . Diabetes Brother   . Hypertension Brother   . Cancer Sister     Surgical History Past Surgical History  Procedure Laterality Date  . Pr vein bypass graft,aorto-fem-pop  2007    Left Fem-post. tibial  . Pr vein bypass graft,aorto-fem-pop  1999-2004    numerous revision Left Fem-pop  . Carotid endarterectomy  2002    Left CEA  . Carotid endarterectomy  2007    Left CEA redo  . Eye surgery Left 02-02-95    Cataract  . Eye surgery Right 04-20-95    Cataract  . Spine surgery  1980    Allergies  Allergen Reactions  . Lopressor [Metoprolol Tartrate] Palpitations    Current Outpatient Prescriptions  Medication Sig Dispense Refill  . acarbose (PRECOSE) 50 MG tablet Take by mouth 3 (three) times daily with meals. 0.5 tablet twice daily.    Marland Kitchen ALPRAZolam (XANAX) 0.5 MG tablet Take 0.5 mg by mouth at bedtime as needed.    Marland Kitchen amLODipine (NORVASC) 5 MG tablet Take by mouth. Take 0.5 tablet daily    . aspirin 81 MG tablet Take 81 mg by mouth daily.    Marland Kitchen atorvastatin (LIPITOR) 80 MG tablet Take 80 mg by mouth. Takes 0.5 tablet daily.    . cilostazol  (PLETAL) 100 MG tablet Take 100 mg by mouth 2 (two) times daily.    . cloNIDine (CATAPRES) 0.3 MG tablet Take 1 tablet by mouth 2 (two) times daily.  6  . docusate sodium (COLACE) 100 MG capsule Take 100 mg by mouth 2 (two) times daily.    Marland Kitchen glipiZIDE (GLUCOTROL) 5 MG tablet Take 5 mg by mouth 2 (two) times daily before a meal.    . losartan (COZAAR) 100 MG  tablet Take 100 mg by mouth daily.    . metoprolol tartrate (LOPRESSOR) 25 MG tablet TAKE 1 TABLET BY MOUTH TWICE DAILY 180 tablet 2  . omega-3 acid ethyl esters (LOVAZA) 1 G capsule Take by mouth 2 (two) times daily. Takes 2 tablets    . oxycodone (ROXICODONE) 30 MG immediate release tablet Take 30 mg by mouth every 8 (eight) hours as needed.    . pantoprazole (PROTONIX) 40 MG tablet Take 40 mg by mouth 2 (two) times daily.     . propafenone (RYTHMOL) 225 MG tablet Take 225 mg by mouth 2 (two) times daily.    . propafenone (RYTHMOL) 225 MG tablet TAKE 1 TABLET BY MOUTH EVERY 8 HOURS 90 tablet 3  . testosterone cypionate (DEPOTESTOTERONE CYPIONATE) 200 MG/ML injection Inject 100 mg into the skin every 14 (fourteen) days.     . Vitamin D, Ergocalciferol, (DRISDOL) 50000 UNITS CAPS capsule Take 50,000 Units by mouth every 7 (seven) days.    Marland Kitchen warfarin (COUMADIN) 3 MG tablet As Directed by INR     No current facility-administered medications for this visit.     REVIEW OF SYSTEMS: See HPI for pertinent positives and negatives.  Physical Examination Filed Vitals:   06/23/15 1330 06/23/15 1334 06/23/15 1346 06/23/15 1348  BP: 148/71 152/69 139/68 137/66  Pulse: 62 58 55 54  Temp:  97.5 F (36.4 C)    TempSrc:  Oral    Resp:  16    Height:  5' 9.5" (1.765 m)    Weight:  149 lb (67.586 kg)    SpO2:  100%     Body mass index is 21.7 kg/(m^2).   General: WDWN in NAD Gait: Normal HENT: WNL Eyes: Pupils equal Pulmonary: normal non-labored breathing, limited air movement in all fields , without Rales, rhonchi,or wheezing. Cardiac:  RRR, no murmur detected  Abdomen: soft, NT, no palpable masses Skin: no rashes, ulcers noted; no Gangrene , no cellulitis; no open wounds;   VASCULAR EXAM  Carotid Bruits Left Right   Positive, moderate Positive, soft   Aorta is not palpable. Radial pulses are 2+ palpable bilateral   VASCULAR EXAM: Extremities without ischemic changes  without Gangrene; without open wounds.     LE Pulses LEFT RIGHT   FEMORAL 3+ palpable 3+ palpable    POPLITEAL not palpable  not palpable   POSTERIOR TIBIAL 3+ palpable  not palpable    DORSALIS PEDIS  ANTERIOR TIBIAL not palpable  not palpable     Musculoskeletal: no muscle wasting or atrophy; no edema Neurologic: A&O X 3; Appropriate Affect ;  MOTOR FUNCTION: 5/5 Symmetric, CN 2-12 intact Speech is fluent/normal. Has some hearing loss.         Non-Invasive Vascular Imaging (06/23/2015):  CEREBROVASCULAR DUPLEX EVALUATION    INDICATION: Carotid artery disease     PREVIOUS INTERVENTION(S): Left carotid endarterectomy 2001    DUPLEX EXAM:     RIGHT  LEFT  Peak Systolic Velocities (cm/s) End Diastolic Velocities (cm/s) Plaque LOCATION Peak Systolic Velocities (cm/s) End Diastolic Velocities (cm/s) Plaque  42 6  CCA PROXIMAL 53 12   41 7  CCA MID 50 11   53 7 HT CCA DISTAL 36 8 HT  420 0 HT ECA 331 0 CP  58 6 HT ICA PROXIMAL 35 12 HT  77 15  ICA MID 78 24   87 14  ICA DISTAL 77 21     2.12 ICA / CCA Ratio (PSV) NA  Antegrade  Vertebral Flow Antegrade   145 Brachial Systolic Pressure (mmHg) 150  Multiphasic (Subclavian artery) Brachial Artery Waveforms Multiphasic (Subclavian artery)    Plaque Morphology:  HM = Homogeneous, HT = Heterogeneous, CP = Calcific Plaque, SP = Smooth Plaque, IP = Irregular Plaque   ADDITIONAL FINDINGS:     IMPRESSION: Right internal carotid artery velocities suggest a <40% stenosis.  Patent left carotid endarterectomy site with evidence of mild hyperplasia at the proximal and distal patch sites without hemodynamically significant changes.     Compared to the previous exam:  No significant change in comparison to the last exam on 12/24/2014.    LOWER EXTREMITY ARTERIAL DUPLEX EVALUATION    INDICATION: Peripheral vascular disease     PREVIOUS INTERVENTION(S): Femoral to posterior tibial artery bypass graft with bilateral cephalic vein 2007.    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    IMPRESSION: Patent left lower extremity bypass graft with no evidence of hemodynamically significant stenosis within the graft.  Inflow disease noted.         ASSESSMENT:  [name] is a 74 y.o. male who is s/p left lower extremity femoral to posterior tibial bypass which was done using composite bilateral cephalic vein grafts in 2007. He has had previous left carotid endarterectomy and redo left carotid endarterectomy most recent of which was 2007.  He had a mild stroke in March 2016 while on coumadin as manifested by weakness in his left hand and foot, and drawing of the left corner of his mouth; these  symptoms lasted about 2 weeks. He was evaluated by MRI of his head as requested by his PCP, per pt. He had physical therapy for this. He denies any subsequent strokes or TIA's.   Today's carotid Duplex suggests <40% right ICA stenosis and a patent left carotid endarterectomy site with evidence of mild hyperplasia at the proximal and distal patch sites without hemodynamically significant changes. No significant change in comparison to the last exam on 12/24/2014.  Today's left LE arterial Duplex suggests a patent left lower extremity bypass graft with no evidence of hemodynamically significant stenosis within the graft.  Inflow disease noted. 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  Pt uses a vapor cigarette, does not know if it has  nicotine; he was advised to wean off the nicotine if it does as nicotine causes vasospasm.  Face to face time with patient was 25 minutes. Over 50% of this time was spent on counseling and coordination of care.   PLAN:   Based on today's exam and non-invasive vascular lab results, the patient will follow up in 6 months with the following tests: left LE arterial Duplex and ABI's. Carotid Duplex in a year. I discussed in depth with the patient the nature of atherosclerosis, and emphasized the importance of maximal medical management including strict control of blood pressure, blood glucose, and lipid levels, obtaining regular exercise, and cessation of smoking.  The patient is aware that without maximal medical management the underlying atherosclerotic disease process will progress, limiting the benefit of any interventions.  The patient was given information about stroke prevention and what symptoms should prompt the patient to seek immediate medical care.  The patient  was given information about PAD including signs, symptoms, treatment, what symptoms should prompt the patient to seek immediate medical care, and risk reduction measures to take. Thank you for allowing [place] to participate in this patient's care.  Charisse March, RN, MSN, FNP-C [facility] Office: [phone]  Clinic MD: Darrick Penna on call  06/23/2015 1:27 PM

## 2015-09-08 ENCOUNTER — Other Ambulatory Visit: Payer: Self-pay | Admitting: Cardiovascular Disease

## 2015-09-09 NOTE — Telephone Encounter (Signed)
Rx request sent to pharmacy.  

## 2015-11-06 ENCOUNTER — Other Ambulatory Visit: Payer: Self-pay | Admitting: Cardiovascular Disease

## 2015-11-07 NOTE — Telephone Encounter (Signed)
Rx request sent to pharmacy.  

## 2015-12-12 ENCOUNTER — Other Ambulatory Visit: Payer: Self-pay | Admitting: Cardiovascular Disease

## 2015-12-12 NOTE — Telephone Encounter (Signed)
Rx request sent to pharmacy.  

## 2015-12-23 ENCOUNTER — Encounter: Payer: Self-pay | Admitting: Family

## 2015-12-30 ENCOUNTER — Encounter: Payer: Self-pay | Admitting: Family

## 2015-12-30 ENCOUNTER — Ambulatory Visit (HOSPITAL_COMMUNITY)
Admission: RE | Admit: 2015-12-30 | Discharge: 2015-12-30 | Disposition: A | Discharge status: Home / Self Care | Payer: Medicare Other | Source: Ambulatory Visit | Attending: Family | Admitting: Family

## 2015-12-30 ENCOUNTER — Ambulatory Visit (INDEPENDENT_AMBULATORY_CARE_PROVIDER_SITE_OTHER): Payer: Medicare Other | Admitting: Family

## 2015-12-30 VITALS — BP 137/66 | HR 59 | Ht 69.5 in | Wt 142.5 lb

## 2015-12-30 DIAGNOSIS — Z720 Tobacco use: Secondary | ICD-10-CM

## 2015-12-30 DIAGNOSIS — I6523 Occlusion and stenosis of bilateral carotid arteries: Secondary | ICD-10-CM | POA: Diagnosis not present

## 2015-12-30 DIAGNOSIS — Z789 Other specified health status: Secondary | ICD-10-CM

## 2015-12-30 DIAGNOSIS — I779 Disorder of arteries and arterioles, unspecified: Secondary | ICD-10-CM

## 2015-12-30 DIAGNOSIS — Z48812 Encounter for surgical aftercare following surgery on the circulatory system: Secondary | ICD-10-CM | POA: Insufficient documentation

## 2015-12-30 DIAGNOSIS — Z95828 Presence of other vascular implants and grafts: Secondary | ICD-10-CM | POA: Insufficient documentation

## 2015-12-30 DIAGNOSIS — Z9889 Other specified postprocedural states: Secondary | ICD-10-CM

## 2015-12-30 DIAGNOSIS — I739 Peripheral vascular disease, unspecified: Secondary | ICD-10-CM | POA: Insufficient documentation

## 2015-12-30 NOTE — Progress Notes (Signed)
[facility] [place] HISTORY AND PHYSICAL -PAD  History of Present Illness [name] is a 75 y.o. male patient of Dr. Hart Rochester who returns for continued followup regarding his left lower extremity femoral to posterior tibial bypass which was done using composite bilateral cephalic vein grafts in 2007. He has had previous left carotid endarterectomy and redo left carotid endarterectomy most recent of which was 2007.  He denies any specific claudication symptoms but is able to walk one block and then stops because of fatigue. His biggest problem is severe back discomfort which is chronic in nature.   He had a mild stroke in March 2016 while on coumadin as manifested by weakness in his left hand and foot, and drawing of the left corner of his mouth; these symptoms lasted about 2 weeks. He was evaluated by MRI of his head as requested by his PCP, per pt. He had physical therapy for this. He denies any subsequent strokes or TIA's.   Pt denies any claudication symptoms with walking, but does have occasional left lateral calf pain which he attributes to low back issues, he takes oxycodone for back pain. His walking is limited by back pain. Pt denies any non healing wounds.  He has been losing weight, weighed 202 pounds 3 years ago, is 142 pounds today. States he has no appetite, denies post prandial abdominal pain. Wife states his PCP is aware of this; pt and wife attribute this to taking acarbose.   He used to have his carotid Duplex checked in Dr. Kandis Cocking office, but was last checked in this office July, 2014: <40% right ICA stenosis, widely patent left ICA. Bilateral ECA stenosis.   Pt Diabetic: Yes, states in good control Pt smoker: smoker, less than 1/2 ppd, 60 + years   Pt meds include:  Statin :Yes Betablocker: Yes ASA: Yes Other anticoagulants/antiplatelets: taking coumadin for PAD    Past Medical History  Diagnosis Date  . Anemia   . Hypertension   .  Diabetes mellitus   . Hyperlipidemia   . Carotid artery occlusion     Carotid endarterectomy 2002 and 2004  . GERD (gastroesophageal reflux disease)   . Anxiety   . PAD (peripheral artery disease) (HCC)     Fem-Fem by-pass graft 1991. redo patch angioplasty in 2000 and 2004.  LEA dopplers: 04/2012- R-ABI 1.34, L-ABI 0.97, patent Left lower extremity graft.  . Barrett esophagus   . History of stress test     March 2012 post stress ejection fraction 56%, negative for ischemia.  Last 2-D echocardiogram March 2012 younger than 55% mild concentric left ventricular hypertrophy grade 1 diastolic dysfunction. Mildly dilated left atrium. 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  . COPD (chronic obstructive pulmonary disease) (HCC)   . Stroke [facility]) March 2016    Lihgt Stroke  . Arthritis     Gout    Social History Social History  Substance Use Topics  . Smoking status: Light Tobacco Smoker -- 0.50 packs/day    Types: E-cigarettes  . Smokeless tobacco: Former Neurosurgeon    Types: Chew    Quit date: 05/21/1968  . Alcohol Use: No    Family History Family History  Problem Relation Age of Onset  . Heart disease Father   . Heart attack Father     X's 3  . Hyperlipidemia Father   . Hypertension Father   . Diabetes Brother   . Hypertension Brother   . Cancer Sister     Past Surgical History  Procedure Laterality Date  . Pr vein bypass graft,aorto-fem-pop  2007    Left Fem-post. tibial  . Pr vein bypass graft,aorto-fem-pop  1999-2004    numerous revision Left Fem-pop  . Carotid endarterectomy  2002    Left CEA  . Carotid endarterectomy  2007    Left CEA redo  . Eye surgery Left 02-02-95    Cataract  . Eye surgery Right 04-20-95    Cataract  . Spine surgery  1980    Allergies  Allergen Reactions  . Lopressor [Metoprolol Tartrate] Palpitations    LOPRESSOR bothers the patient.    Current Outpatient Prescriptions  Medication Sig Dispense Refill  . acarbose (PRECOSE) 50 MG tablet Take by  mouth 3 (three) times daily with meals. 0.5 tablet twice daily.    Marland Kitchen ALPRAZolam (XANAX) 0.5 MG tablet Take 0.5 mg by mouth at bedtime as needed.    Marland Kitchen amLODipine (NORVASC) 5 MG tablet Take by mouth. Take 0.5 tablet daily    . aspirin 81 MG tablet Take 81 mg by mouth daily.    Marland Kitchen atorvastatin (LIPITOR) 80 MG tablet Take 80 mg by mouth. Takes 0.5 tablet daily.    . cilostazol (PLETAL) 100 MG tablet Take 100 mg by mouth 2 (two) times daily.    . cloNIDine (CATAPRES) 0.3 MG tablet Take 1 tablet by mouth 2 (two) times daily.  6  . docusate sodium (COLACE) 100 MG capsule Take 100 mg by mouth 2 (two) times daily.    . Ferrous Fumarate-Folic Acid (FERROCITE F PO) Take by mouth daily.    Marland Kitchen glipiZIDE (GLUCOTROL) 5 MG tablet Take 5 mg by mouth 2 (two) times daily before a meal.    . losartan (COZAAR) 100 MG tablet Take 100 mg by mouth daily.    . metoprolol tartrate (LOPRESSOR) 25 MG tablet TAKE 1 TABLET BY MOUTH TWICE DAILY 180 tablet 0  . omega-3 acid ethyl esters (LOVAZA) 1 G capsule Take by mouth 2 (two) times daily. Takes 2 tablets    . oxycodone (ROXICODONE) 30 MG immediate release tablet Take 30 mg by mouth every 8 (eight) hours as needed.    . pantoprazole (PROTONIX) 40 MG tablet Take 40 mg by mouth 2 (two) times daily.     . propafenone (RYTHMOL) 225 MG tablet TAKE 1 TABLET BY MOUTH EVERY 8 HOURS 90 tablet 0  . quiNINE (QUALAQUIN) 324 MG capsule Take 648 mg by mouth as needed.    . testosterone cypionate (DEPOTESTOTERONE CYPIONATE) 200 MG/ML injection Inject 100 mg into the skin every 14 (fourteen) days.     . Vitamin D, Ergocalciferol, (DRISDOL) 50000 UNITS CAPS capsule Take 50,000 Units by mouth every 7 (seven) days.    Marland Kitchen warfarin (COUMADIN) 3 MG tablet As Directed by INR     No current facility-administered medications for this visit.    ROS: See HPI for pertinent positives and negatives.   Physical Examination  Filed Vitals:   12/30/15 1245  BP: 137/66  Pulse: 59  Height: 5' 9.5"  (1.765 m)  Weight: 142 lb 8 oz (64.638 kg)  SpO2: 97%   Body mass index is 20.75 kg/(m^2).  General: WDWN in NAD Gait: Normal HENT: WNL Eyes: Pupils equal Pulmonary: normal non-labored breathing, limited air movement in all fields Cardiac: RRR, no murmur detected  Abdomen: soft, NT, no palpable masses Skin: no rashes, no ulcers;no cellulitis.   VASCULAR EXAM  Carotid Bruits Left Right   Positive, moderate Positive, soft   Aorta is not  palpable. Radial pulses are 2+ palpable bilateral   VASCULAR EXAM: Extremities without ischemic changes  without Gangrene; without open wounds.     LE Pulses LEFT RIGHT   FEMORAL 3+ palpable 3+ palpable    POPLITEAL not palpable  not palpable   POSTERIOR TIBIAL 3+ palpable  not palpable    DORSALIS PEDIS  ANTERIOR TIBIAL not palpable  not palpable     Musculoskeletal: no muscle wasting or atrophy; no edema Neurologic: A&O X 3; Appropriate Affect ;  MOTOR FUNCTION: 5/5 Symmetric, CN 2-12 intact Speech is fluent/normal. Has some hearing loss.               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 Patent left LE bypass graft with no evidence of significant stenosis within the graft, inflow disease noted (312 cm/s at inflow artery). No significant change compared to exam of 06/23/15.   ASSESSMENT: [name] is a 75 y.o. male who is s/p left lower extremity femoral to posterior tibial bypass which was done using composite  bilateral cephalic vein grafts in 2007. His walking is limited by back pain, he does not seem to indicate claudication symptoms with walking, he has no signs of ischemia in his feet/legs.  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 Patent left LE bypass graft with no evidence of significant stenosis within the graft, inflow disease noted (312 cm/s at inflow artery). No significant change compared to exam of 06/23/15.   He has had previous left carotid endarterectomy and redo left carotid endarterectomy most recent of which was 2007.  He had a mild stroke in March 2016 while on coumadin as manifested by weakness in his left hand and foot, and drawing of the left corner of his mouth; these symptoms lasted about 2 weeks. He was evaluated by MRI of his head as requested by his PCP, per pt. He had physical therapy for this. He denies any subsequent strokes or TIA's.   06/23/15 carotid duplex indicated <40% right ICA stenosis and a patent left carotid endarterectomy site with evidence of mild hyperplasia at the proximal and distal patch sites without hemodynamically significant changes. No significant change in comparison to the last exam on 12/24/2014.   I spoke with Dr. Hart Rochester re soft thrombus in aneurysm mid thigh portion of graft, no claudication sx's, no change from six months prior. Any subsequent revascularization of left LE must necessarily be for limb salvage.   His atherosclerotic risk factors include controlled DM and active smoking, see Plan.  He takes coumadin, a statin, and ASA.    Face to face time with patient was 25 minutes. Over 50% of this time was spent on counseling and coordination of care.   PLAN:  The patient was counseled re smoking cessation and given several free resources re smoking cessation.  Based on the patient's vascular studies and examination, pt will return to clinic in 6  months with ABI's, left LE arterial duplex, and carotid duplex.  I discussed in depth with the patient the nature of atherosclerosis, and emphasized the importance of maximal medical management including strict control of blood pressure, blood glucose, and lipid levels, obtaining regular  exercise, and cessation of smoking.  The patient is aware that without maximal medical management the underlying atherosclerotic disease process will progress, limiting the benefit of any interventions.  The patient was given information about PAD including signs, symptoms, treatment, what symptoms should prompt the patient to seek immediate medical care, and risk reduction measures to take.  Charisse March, RN, MSN, FNP-C [facility] [place] Phone: [phone]  Clinic MD: Hart Rochester  12/30/2015 1:05 PM

## 2015-12-31 NOTE — Addendum Note (Signed)
Addended by: Sharee Pimple on: 12/31/2015 09:56 AM   Modules accepted: Orders

## 2016-01-16 ENCOUNTER — Other Ambulatory Visit: Payer: Self-pay | Admitting: Cardiovascular Disease

## 2016-03-25 ENCOUNTER — Other Ambulatory Visit (HOSPITAL_BASED_OUTPATIENT_CLINIC_OR_DEPARTMENT_OTHER): Payer: Self-pay | Admitting: Physician Assistant

## 2016-03-25 DIAGNOSIS — R7989 Other specified abnormal findings of blood chemistry: Secondary | ICD-10-CM

## 2016-03-25 DIAGNOSIS — R945 Abnormal results of liver function studies: Secondary | ICD-10-CM

## 2016-03-25 DIAGNOSIS — R634 Abnormal weight loss: Secondary | ICD-10-CM

## 2016-03-26 ENCOUNTER — Ambulatory Visit (HOSPITAL_BASED_OUTPATIENT_CLINIC_OR_DEPARTMENT_OTHER)
Admission: RE | Admit: 2016-03-26 | Discharge: 2016-03-26 | Disposition: A | Discharge status: Home / Self Care | Payer: Medicare Other | Source: Ambulatory Visit | Attending: Physician Assistant | Admitting: Physician Assistant

## 2016-03-26 ENCOUNTER — Encounter (HOSPITAL_BASED_OUTPATIENT_CLINIC_OR_DEPARTMENT_OTHER): Payer: Self-pay

## 2016-03-26 DIAGNOSIS — K8070 Calculus of gallbladder and bile duct without cholecystitis without obstruction: Secondary | ICD-10-CM | POA: Insufficient documentation

## 2016-03-26 DIAGNOSIS — R7989 Other specified abnormal findings of blood chemistry: Secondary | ICD-10-CM | POA: Diagnosis present

## 2016-03-26 DIAGNOSIS — R634 Abnormal weight loss: Secondary | ICD-10-CM | POA: Diagnosis not present

## 2016-03-26 DIAGNOSIS — R918 Other nonspecific abnormal finding of lung field: Secondary | ICD-10-CM | POA: Diagnosis not present

## 2016-03-26 DIAGNOSIS — R945 Abnormal results of liver function studies: Secondary | ICD-10-CM

## 2016-03-26 MED ORDER — IOPAMIDOL (ISOVUE-300) INJECTION 61%
100.0000 mL | Freq: Once | INTRAVENOUS | Status: AC | PRN
  Administered 2016-03-26: 100 mL via INTRAVENOUS

## 2016-03-31 ENCOUNTER — Telehealth: Payer: Self-pay | Admitting: Internal Medicine

## 2016-03-31 ENCOUNTER — Encounter: Payer: Self-pay | Admitting: Internal Medicine

## 2016-03-31 NOTE — Telephone Encounter (Signed)
Left message for pt to call back  °

## 2016-03-31 NOTE — Telephone Encounter (Signed)
Pt has appt in July and will keep that appt. Pt knows to call back if symptoms worsen.

## 2016-04-21 ENCOUNTER — Other Ambulatory Visit (HOSPITAL_BASED_OUTPATIENT_CLINIC_OR_DEPARTMENT_OTHER): Payer: Self-pay | Admitting: Physician Assistant

## 2016-04-21 DIAGNOSIS — R634 Abnormal weight loss: Secondary | ICD-10-CM

## 2016-04-21 DIAGNOSIS — Z87891 Personal history of nicotine dependence: Secondary | ICD-10-CM

## 2016-04-26 ENCOUNTER — Ambulatory Visit (HOSPITAL_BASED_OUTPATIENT_CLINIC_OR_DEPARTMENT_OTHER)
Admission: RE | Admit: 2016-04-26 | Discharge: 2016-04-26 | Disposition: A | Discharge status: Home / Self Care | Payer: Medicare Other | Source: Ambulatory Visit | Attending: Physician Assistant | Admitting: Physician Assistant

## 2016-04-26 DIAGNOSIS — R918 Other nonspecific abnormal finding of lung field: Secondary | ICD-10-CM | POA: Diagnosis not present

## 2016-04-26 DIAGNOSIS — I712 Thoracic aortic aneurysm, without rupture: Secondary | ICD-10-CM | POA: Diagnosis not present

## 2016-04-26 DIAGNOSIS — I700 Atherosclerosis of aorta: Secondary | ICD-10-CM | POA: Diagnosis not present

## 2016-04-26 DIAGNOSIS — J439 Emphysema, unspecified: Secondary | ICD-10-CM | POA: Insufficient documentation

## 2016-04-26 DIAGNOSIS — Z720 Tobacco use: Secondary | ICD-10-CM | POA: Insufficient documentation

## 2016-04-26 DIAGNOSIS — Z87891 Personal history of nicotine dependence: Secondary | ICD-10-CM

## 2016-04-26 DIAGNOSIS — R634 Abnormal weight loss: Secondary | ICD-10-CM | POA: Insufficient documentation

## 2016-04-26 DIAGNOSIS — I2510 Atherosclerotic heart disease of native coronary artery without angina pectoris: Secondary | ICD-10-CM | POA: Insufficient documentation

## 2016-05-06 ENCOUNTER — Institutional Professional Consult (permissible substitution) (INDEPENDENT_AMBULATORY_CARE_PROVIDER_SITE_OTHER): Payer: Medicare Other | Admitting: Cardiothoracic Surgery

## 2016-05-06 ENCOUNTER — Encounter: Payer: Self-pay | Admitting: *Deleted

## 2016-05-06 VITALS — BP 142/67 | HR 74 | Resp 16 | Ht 69.5 in | Wt 134.0 lb

## 2016-05-06 DIAGNOSIS — I779 Disorder of arteries and arterioles, unspecified: Secondary | ICD-10-CM | POA: Diagnosis not present

## 2016-05-06 DIAGNOSIS — I712 Thoracic aortic aneurysm, without rupture, unspecified: Secondary | ICD-10-CM

## 2016-05-06 NOTE — Progress Notes (Signed)
[address]       [place] [address]             [phone]                    [name] [facility] Medical Record #[id number] Date of Birth: 05/27/1941  Referring: Lovenia Kim, PA-C Primary Care: Ethel Rana  Chief Complaint:    Chief Complaint  Patient presents with  . Thoracic Aortic Aneurysm    CT CHEST 04/26/16    History of Present Illness:    [name] 75 y.o. male is seen in the office for finding on chest CT of descending thoracic aneurysm . Patient has known severe peripheral vascular disease followed by  Dr. Hart Rochester for  his left lower extremity femoral to posterior tibial bypass which was done using composite bilateral cephalic vein grafts in 2007. He has had previous left carotid endarterectomy and redo left carotid endarterectomy most recent of which was 2007.  He denies any specific claudication symptoms but is able to walk one block and then stops because of fatigue. He has  is severe back discomfort which is chronic in nature.   He had a mild stroke in March 2016 while on coumadin as manifested by weakness in his left hand and foot, and drawing of the left corner of his mouth; these symptoms lasted about 2 weeks. He was evaluated by MRI of his head as requested by his PCP, per pt. He denies any subsequent strokes or TIA's.    He complained of cough and blood tinged sputum, also 50 lb wt loss over the past two years . CT of abdomen then ct of chest was done. 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 Patient referred to pulmonary to work up hemoptysis and  Bilateral lower lobe peribronchovascular reticular and airspace opacities with several airspace opacities appearing masslike.   Current Activity/ Functional Status:  Patient is not independent with mobility/ambulation, transfers, ADL's, IADL's.   Zubrod Score: At the time of surgery this patient's most appropriate  activity status/level should be described as:     0    Normal activity, no symptoms     1    Restricted in physical strenuous activity but ambulatory, able to do out light work     2    Ambulatory and capable of self care, unable to do work activities, up and about               >50 % of waking hours                                  3    Only limited self care, in bed greater than 50% of waking hours     4    Completely disabled, no self care, confined to bed or chair     5    Moribund   Past Medical History  Diagnosis Date  . Anemia   . Hypertension   . Diabetes mellitus   . Hyperlipidemia   . Carotid artery occlusion     Carotid endarterectomy 2002 and 2004  . GERD (gastroesophageal reflux disease)   . Anxiety   . PAD (peripheral artery disease) (HCC)     Fem-Fem by-pass graft 1991. redo patch angioplasty in 2000 and 2004.  LEA dopplers: 04/2012-  R-ABI 1.34, L-ABI 0.97, patent Left lower extremity graft.  . Barrett esophagus   . History of stress test     March 2012 post stress ejection fraction 56%, negative for ischemia.  Last 2-D echocardiogram March 2012 younger than 55% mild concentric left ventricular hypertrophy grade 1 diastolic dysfunction. Mildly dilated left atrium. 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  . COPD (chronic obstructive pulmonary disease) (HCC)   . Stroke [facility](HCC) March 2016    Lihgt Stroke  . Arthritis     Gout    Past Surgical History  Procedure Laterality Date  . Pr vein bypass graft,aorto-fem-pop  2007    Left Fem-post. tibial  . Pr vein bypass graft,aorto-fem-pop  1999-2004    numerous revision Left Fem-pop  . Carotid endarterectomy  2002    Left CEA  . Carotid endarterectomy  2007    Left CEA redo  . Eye surgery Left 02-02-95    Cataract  . Eye surgery Right 04-20-95    Cataract  . Spine surgery  1980    Family History  Problem Relation Age of Onset  . Heart disease Father   . Heart attack Father     X's 3  . Hyperlipidemia Father   .  Hypertension Father   . Diabetes Brother   . Hypertension Brother   . Cancer Sister     Social History   Social History  . Marital Status: Married    Spouse Name: N/A  . Number of Children: N/A  . Years of Education: N/A   Occupational History  . Not on file.   Social History Main Topics  . Smoking status: Light Tobacco Smoker -- 0.50 packs/day for 50 years    Types: Cigarettes, E-cigarettes  . Smokeless tobacco: Former NeurosurgeonUser    Types: Chew    Quit date: 05/21/1968  . Alcohol Use: No  . Drug Use: No  . Sexual Activity: Not on file   Other Topics Concern  . Not on file   Social History Narrative    History  Smoking status  . Light Tobacco Smoker -- 0.50 packs/day for 50 years  . Types: Cigarettes, E-cigarettes  Smokeless tobacco  . Former NeurosurgeonUser  . Types: Chew  . Quit date: 05/21/1968    History  Alcohol Use No     Allergies  Allergen Reactions  . Lopressor [Metoprolol Tartrate] Palpitations    LOPRESSOR bothers the patient.    Current Outpatient Prescriptions  Medication Sig Dispense Refill  . amLODipine (NORVASC) 5 MG tablet Take by mouth. Take 0.5 tablet daily    . aspirin 81 MG tablet Take 81 mg by mouth daily.    Marland Kitchen. atorvastatin (LIPITOR) 80 MG tablet Take 80 mg by mouth. Takes 0.5 tablet daily.    . cilostazol (PLETAL) 100 MG tablet Take 100 mg by mouth 2 (two) times daily.    . cloNIDine (CATAPRES) 0.3 MG tablet Take 1 tablet by mouth 3 (three) times daily.   6  . docusate sodium (COLACE) 100 MG capsule Take 100 mg by mouth 2 (two) times daily.    . Ferrous Fumarate-Folic Acid (FERROCITE F PO) Take by mouth daily.    Marland Kitchen. glipiZIDE (GLUCOTROL) 5 MG tablet Take 5 mg by mouth 2 (two) times daily before a meal.    . losartan (COZAAR) 100 MG tablet Take 100 mg by mouth daily.    . metoprolol tartrate (LOPRESSOR) 25 MG tablet TAKE 1 TABLET BY MOUTH TWICE DAILY 180 tablet 0  . omega-3 acid  ethyl esters (LOVAZA) 1 G capsule Take by mouth 2 (two) times daily.  Takes 2 tablets    . oxycodone (ROXICODONE) 30 MG immediate release tablet Take 30 mg by mouth every 8 (eight) hours as needed.    . pantoprazole (PROTONIX) 40 MG tablet Take 40 mg by mouth 2 (two) times daily.     . propafenone (RYTHMOL) 225 MG tablet TAKE 1 TABLET BY MOUTH EVERY 8 HOURS 90 tablet 1  . testosterone cypionate (DEPOTESTOTERONE CYPIONATE) 200 MG/ML injection Inject 100 mg into the skin every 14 (fourteen) days.     . Vitamin D, Ergocalciferol, (DRISDOL) 50000 UNITS CAPS capsule Take 50,000 Units by mouth every 7 (seven) days.    Marland Kitchen warfarin (COUMADIN) 3 MG tablet As Directed by INR    . ALPRAZolam (XANAX) 0.5 MG tablet Take 0.5 mg by mouth at bedtime as needed.     No current facility-administered medications for this visit.      Review of Systems:     Cardiac Review of Systems: Y or N  Chest Pain [    ]  Resting SOB [   ] Exertional SOB  [  ]  Orthopnea [  ]   Pedal Edema [   ]    Palpitations [  ] Syncope  [  ]   Presyncope [   ]  General Review of Systems: [Y] = yes [  ]=no Constitional: recent weight change Mahler.Beck  ];  Wt loss over the last 3 months [   ] anorexia Cove.Etienne  ]; fatigue [  y]; nausea [  ]; night sweats [  ]; fever [  ]; or chills [  ];          Dental: poor dentition[  ]; Last Dentist visit:   Eye : blurred vision [  ]; diplopia [   ]; vision changes [  ];  Amaurosis fugax[  ]; Resp: cough Cove.Etienne  ];  wheezing[y  ];  hemoptysis[y  ]; shortness of breath[ y ]; paroxysmal nocturnal dyspnea[ y ]; dyspnea on exertion[ y ]; or orthopnea[  ];  GI:  gallstones[  ], vomiting[  ];  dysphagia[  ]; melena[  ];  hematochezia [  ]; heartburn[  ];   Hx of  Colonoscopy[  ]; GU: kidney stones [ y ]; hematuria[  ];   dysuria [  ];  nocturia[  ];  history of     obstruction [  ]; urinary frequency [  ]             Skin: rash, swelling[  ];, hair loss[  ];  peripheral edema[  ];  or itching[  ]; Musculosketetal: myalgias[  ];  joint swelling[  ];  joint erythema[  ];  joint pain[  ];   back pain[  ];  Heme/Lymph: bruising[  ];  bleeding[  ];  anemia[  ];  Neuro: TIA[  ];  headaches[  ];  stroke[y  ];  vertigo[  ];  seizures[  ];   paresthesias[  ];  difficulty walking[ y ];  Psych:depression[  ]; anxiety[  ];  Endocrine: diabetes[ y ];  thyroid dysfunction[  ];  Immunizations: Flu up to date [ y ]; Pneumococcal up to date Cove.Etienne  ];  Other:  Physical Exam: BP 142/67 mmHg  Pulse 74  Resp 16  Ht 5' 9.5" (1.765 m)  Wt 134 lb (60.782 kg)  BMI 19.51 kg/m2  SpO2 98%  PHYSICAL EXAMINATION: General appearance:  alert, cooperative, appears older than stated age and cachectic Head: Normocephalic, without obvious abnormality, atraumatic Neck: no adenopathy, bilaterial carotid bruits carotid bruit, no JVD, supple, symmetrical, trachea midline and thyroid not enlarged, symmetric, no tenderness/mass/nodules Lymph nodes: Cervical, supraclavicular, and axillary nodes normal. Resp: diminished breath sounds bibasilar Back: symmetric, no curvature. ROM normal. No CVA tenderness. Cardio: irregularly irregular rhythm GI: soft, non-tender; bowel sounds normal; no masses,  no organomegaly Extremities: extremities normal, atraumatic, no cyanosis or edema Neurologic: Grossly normal  Left pt pulse palpable , as noted aneurysm dilation of previous composite vein graft left leg, left dp and right pt/dp not palpable      Diagnostic Studies & Laboratory data:     Recent Radiology Findings:   Ct Chest Wo Contrast  04/26/2016  CLINICAL DATA:  Pt states current smoker x 60 years x 1ppd, pt denies cough/sob, wt loss close to 100lbs in a couple years EXAM: CT CHEST WITHOUT CONTRAST TECHNIQUE: Multidetector CT imaging of the chest was performed following the standard protocol without IV contrast. COMPARISON:  Chest radiograph, 04/15/2016. FINDINGS: Neck base and axilla: No mass or adenopathy. Dense atherosclerotic vascular calcifications. Mediastinum and hila: Heart is normal in size and  configuration. There are dense coronary artery calcifications. There is small focal bulge along the left margin of the aortic arch just distal to the left subclavian artery takeoff. There is a more significant focal bulge of the descending thoracic aorta having a maximum diameter 5.5 cm, measuring 4 cm in length. Dense atherosclerotic calcifications are noted throughout the thoracic aorta and along the aortic arch branch vessels extending into the upper abdominal arteries. There is an enlarged subcarinal lymph node measuring 14 mm in short axis. No mediastinal or hilar masses or other enlarged lymph nodes. Lungs and pleura: There is reticular and patchy airspace peribronchovascular opacities in both lower lobes, left greater than right. Low attenuation material occludes the posterior, basal segment of the left lower lobe consistent with mucous/secretions. There are masslike areas of consolidation in the lower lobes, largest on the left measuring 3.1 x 2.1 x 2.3 cm. Largest on the right measures 2.1 x 1.1 x 2.0 cm. Lungs show additional areas of subtle ground-glass opacity and peripheral reticular opacities, the latter likely scarring. In the apices there is pleural parenchymal scarring. Mild to moderate centrilobular emphysema is noted most evident near the lung apices. There is no evidence of pulmonary edema. No pleural effusion or pneumothorax. Limited upper abdomen: Left renal atrophy. No acute findings. No convincing liver or adrenal mass on the included field of view. Musculoskeletal: Bones are demineralized. There are degenerative changes throughout the visualized spine old right rib fractures noted. No osteoblastic or osteolytic lesions. IMPRESSION: 1. Bilateral lower lobe peribronchovascular reticular and airspace opacities with several airspace opacities appearing masslike. This is most likely infectious/ inflammatory in origin. Neoplastic disease is not excluded. Followup options include tissue sampling of  the larger left lower lobe mass like consolidation, short-term CT follow-up following therapy if there are symptoms suggestive of infection, or follow-up PET-CT. 2. Focal descending thoracic aortic aneurysm measuring 5.5 cm. Cardiothoracic surgical follow-up is recommended. 3. There are chronic appearing changes of apical pleural parenchymal scarring and emphysema as well as extensive aortic and branch vessel atherosclerotic disease and coronary artery calcifications. Electronically Signed   By: Amie Portland M.D.   On: 04/26/2016 15:25     I have independently reviewed the above radiologic studies.  Recent Lab Findings: Lab Results  Component Value Date  WBC 8.0 06/30/2007   HGB 11.5 DELTA CHECK NOTED* 06/30/2007   HCT 32.9* 06/30/2007   PLT 220 06/30/2007   GLUCOSE 108* 06/30/2007   ALT 23 06/26/2007   AST 35 06/26/2007   NA 144 06/30/2007   K 3.3* 06/30/2007   CL 112 06/30/2007   CREATININE 1.45 06/30/2007   BUN 22 06/30/2007   CO2 23 06/30/2007   INR 1.3 06/30/2007      Assessment / Plan:   1/ Focal descending thoracic aortic aneurysm measuring 5.5 cm 2/ Bilateral lower lobe peribronchovascular reticular and airspace opacities with several airspace opacities appearing masslike. Could be  infectious/ inflammatory in origin but  Neoplastic disease is not ruled out  3/ Severe COPD- long term and current tobacco use 4/ Protein malnutrition 5/extensive aortic and branch vessel atherosclerotic disease and coronary artery calcifications 6/ long term anticoagulation on coumadin, history of AF and stroke 7/ Diabetic with peripheral vascular, cerebral vascular complications and neuropathy complications   Discussed with patient and family the dx of 5.5 cm saccular descending thoracic aneurysm, risk of rupture and the possibility of aortic stenting for treatment of thoracic aneurysm, however with degree of vascular calcification  Will complicate access and my require direct common iliac  graft for access. Review of CTA suggest a conduit to the right common iliac artery and placement of 31 x 15 or possibility 34 x 15 cm gore tag device via a 22 french sheath. Patient and family under stand he will see Dr Myra GianottiBrabham as vascular consult and pulmonary service to evaluate bilateral air space disease, poss lung masses. When pulmonary status is evaluated will consider thoracic aortic stent placement.  I  spent 40 minutes counseling the patient face to face and 50% or more the  time was spent in counseling and coordination of care. The total time spent in the appointment was 60 minutes.  Delight OvensEdward B Paris Chiriboga MD      [address] [place] [id number] Office [phone]   Beeper [phone]  05/06/2016 3:55 PM

## 2016-05-07 ENCOUNTER — Encounter: Payer: Self-pay | Admitting: *Deleted

## 2016-05-10 ENCOUNTER — Encounter: Payer: Medicare Other | Admitting: Surgery

## 2016-05-17 ENCOUNTER — Telehealth: Payer: Self-pay | Admitting: Surgery

## 2016-05-17 NOTE — Telephone Encounter (Signed)
Spoke to pt's wife to resch for 7/19 at 9:30 w/ [facility].

## 2016-05-17 NOTE — Telephone Encounter (Signed)
-----   Message from Glori BickersStephanie N Dehart, RN sent at 05/17/2016  8:34 AM EDT ----- Regarding: R/S OV to [facility] Will you please schedule this patient with Dr. Myra GianottiBrabham, instead of Dr. Edilia Boickson? Drs. Tyrone SageGerhardt and Brabham are possibly going to schedule a TEVAR.  Thanks, Judeth CornfieldStephanie ----- Message -----    From: Davina Pokeyan C Brooks, RN    Sent: 05/16/2016   8:23 AM      To: Glori BickersStephanie N Dehart, RN  See below.  It sounds like Dr. Tyrone SageGerhardt wants patient with Dr. Myra GianottiBrabham.  Can you let Joyce GrossKay know?  Thanks so much,  Ryan ----- Message -----    From: Delight OvensEdward B Gerhardt, MD    Sent: 05/14/2016   5:43 PM      To: Davina Pokeyan C Brooks, RN  He was seeing about leg issue but was also seeing him because he need vascular access for thoracic stent graft   ----- Message -----    From: Davina Pokeyan C Brooks, RN    Sent: 05/14/2016   4:39 PM      To: Delight OvensEdward B Gerhardt, MD  Hey,   Are you okay with this patient seeing Dr. Edilia Boickson?  They scheduled it with him for next week b/c Dr. Myra GianottiBrabham doesn't have an opening for 3-4 weeks.  I told [facility] I would ask you if you were okay with that.  If not, I will have them switch it to Dr. Myra GianottiBrabham!  Thanks,  Alycia Rossettiyan

## 2016-05-21 ENCOUNTER — Encounter: Payer: Medicare Other | Admitting: Vascular Surgery

## 2016-05-28 ENCOUNTER — Encounter: Payer: Self-pay | Admitting: Surgery

## 2016-06-01 ENCOUNTER — Encounter: Payer: Self-pay | Admitting: *Deleted

## 2016-06-02 ENCOUNTER — Institutional Professional Consult (permissible substitution): Payer: Medicare Other | Admitting: Pulmonary Disease

## 2016-06-02 ENCOUNTER — Ambulatory Visit (INDEPENDENT_AMBULATORY_CARE_PROVIDER_SITE_OTHER): Payer: Medicare Other | Admitting: Internal Medicine

## 2016-06-02 ENCOUNTER — Encounter: Payer: Self-pay | Admitting: Internal Medicine

## 2016-06-02 ENCOUNTER — Ambulatory Visit (INDEPENDENT_AMBULATORY_CARE_PROVIDER_SITE_OTHER): Payer: Medicare Other | Admitting: Pulmonary Disease

## 2016-06-02 ENCOUNTER — Encounter: Payer: Self-pay | Admitting: Surgery

## 2016-06-02 ENCOUNTER — Ambulatory Visit (INDEPENDENT_AMBULATORY_CARE_PROVIDER_SITE_OTHER): Payer: Medicare Other | Admitting: Surgery

## 2016-06-02 ENCOUNTER — Encounter: Payer: Self-pay | Admitting: Pulmonary Disease

## 2016-06-02 ENCOUNTER — Other Ambulatory Visit (INDEPENDENT_AMBULATORY_CARE_PROVIDER_SITE_OTHER): Payer: Medicare Other

## 2016-06-02 VITALS — BP 90/58 | HR 96 | Ht 66.5 in | Wt 136.4 lb

## 2016-06-02 VITALS — BP 109/71 | HR 100 | Temp 97.7°F | Resp 18 | Ht 69.5 in | Wt 135.8 lb

## 2016-06-02 VITALS — BP 90/60 | HR 100 | Ht 66.25 in | Wt 136.2 lb

## 2016-06-02 DIAGNOSIS — I779 Disorder of arteries and arterioles, unspecified: Secondary | ICD-10-CM | POA: Diagnosis not present

## 2016-06-02 DIAGNOSIS — R945 Abnormal results of liver function studies: Secondary | ICD-10-CM

## 2016-06-02 DIAGNOSIS — I712 Thoracic aortic aneurysm, without rupture, unspecified: Secondary | ICD-10-CM

## 2016-06-02 DIAGNOSIS — R7989 Other specified abnormal findings of blood chemistry: Secondary | ICD-10-CM | POA: Diagnosis not present

## 2016-06-02 DIAGNOSIS — R932 Abnormal findings on diagnostic imaging of liver and biliary tract: Secondary | ICD-10-CM

## 2016-06-02 DIAGNOSIS — J449 Chronic obstructive pulmonary disease, unspecified: Secondary | ICD-10-CM

## 2016-06-02 DIAGNOSIS — R634 Abnormal weight loss: Secondary | ICD-10-CM | POA: Diagnosis not present

## 2016-06-02 DIAGNOSIS — K831 Obstruction of bile duct: Secondary | ICD-10-CM | POA: Diagnosis not present

## 2016-06-02 DIAGNOSIS — Z720 Tobacco use: Secondary | ICD-10-CM

## 2016-06-02 DIAGNOSIS — F17200 Nicotine dependence, unspecified, uncomplicated: Secondary | ICD-10-CM

## 2016-06-02 LAB — CBC WITH DIFFERENTIAL/PLATELET
BASOS PCT: 0.3 % (ref 0.0–3.0)
Basophils Absolute: 0 10*3/uL (ref 0.0–0.1)
EOS ABS: 0.6 10*3/uL (ref 0.0–0.7)
Eosinophils Relative: 5.8 % — ABNORMAL HIGH (ref 0.0–5.0)
HCT: 35.8 % — ABNORMAL LOW (ref 39.0–52.0)
HEMOGLOBIN: 12 g/dL — AB (ref 13.0–17.0)
Lymphocytes Relative: 27 % (ref 12.0–46.0)
Lymphs Abs: 2.8 10*3/uL (ref 0.7–4.0)
MCHC: 33.5 g/dL (ref 30.0–36.0)
MCV: 90.6 fl (ref 78.0–100.0)
MONO ABS: 1 10*3/uL (ref 0.1–1.0)
Monocytes Relative: 9.2 % (ref 3.0–12.0)
Neutro Abs: 6.1 10*3/uL (ref 1.4–7.7)
Neutrophils Relative %: 57.7 % (ref 43.0–77.0)
Platelets: 178 10*3/uL (ref 150.0–400.0)
RBC: 3.96 Mil/uL — AB (ref 4.22–5.81)
RDW: 16.8 % — AB (ref 11.5–15.5)
WBC: 10.5 10*3/uL (ref 4.0–10.5)

## 2016-06-02 LAB — COMPREHENSIVE METABOLIC PANEL
ALBUMIN: 3.7 g/dL (ref 3.5–5.2)
ALK PHOS: 71 U/L (ref 39–117)
ALT: 16 U/L (ref 0–53)
AST: 27 U/L (ref 0–37)
BILIRUBIN TOTAL: 0.4 mg/dL (ref 0.2–1.2)
BUN: 35 mg/dL — AB (ref 6–23)
CHLORIDE: 102 meq/L (ref 96–112)
CO2: 25 meq/L (ref 19–32)
CREATININE: 2.05 mg/dL — AB (ref 0.40–1.50)
Calcium: 9.2 mg/dL (ref 8.4–10.5)
GFR: 33.76 mL/min — ABNORMAL LOW (ref >60.00)
GLUCOSE: 165 mg/dL — AB (ref 70–99)
Potassium: 4.1 mEq/L (ref 3.5–5.1)
SODIUM: 135 meq/L (ref 135–145)
Total Protein: 7.4 g/dL (ref 6.0–8.3)

## 2016-06-02 MED ORDER — LEVOFLOXACIN 500 MG PO TABS: 500.0000 mg | ORAL_TABLET | Freq: Every day | ORAL | Status: DC

## 2016-06-02 NOTE — Progress Notes (Signed)
   Subjective:    Patient ID: [name], male    DOB: 12-22-1940, 75 y.o.   MRN: [id number]  HPI Consult for evaluation of abnormal CT scan.  [name] is a 75 year old heavy active smoker with past medical history as below. He was evaluated for weight loss, loss of appetite. He had a CT of the abdomen which showed cholelithiasis, bile duct dilation for which she is being evaluated by Dr. Marina GoodellPerry, GI. The CT also showed right basilar consolidation, opacities. She was treated with a course of Augmentin.  He got a dedicated CT of the chest which showed slightly worse opacities. His been referred to our clinic for further evaluation. The CT scan chest is also significant for significant coronary atherosclerosis and thoracic aortic aneurysm. He attributes his weight loss to acarbose. He's been subsequently switched to Onglyza with improvement in appetite and he is getting 2 pounds over the past few weeks.  His chronic cough with sputum production. Has dyspnea on exertion, wheezing. He denies any hemoptysis, fevers, chills. He is a heavy ex-smoker and is trying to quit.  DATA: CT abdomen 03/26/16 - cholelithiasis, choledocholithiasis, patchy lower lobe opacities. CT chest 04/26/16- progression of lower lobe opacities with new masslike consolidation in bilateral lower lobes. Images reviewed.  Social history: 25 pack year smoking history. Current active smoker. No alcohol, drug use.  Family History: Sister-cancer Brother-diabetes, hypertension Father-coronary artery disease, hypertension.   Review of Systems Cough, sputum production, dyspnea, wheezing. No hemoptysis No chest pain, palpitation. Positive for loss of weight, loss of appetite, malaise, fatigue. No nausea, vomiting, diarrhea, consultation. All other review of systems negative.    Objective:   Physical Exam Blood pressure 90/58, pulse 96, height 5' 6.5" (1.689 m), weight 136 lb 6.4 oz (61.871 kg), SpO2 98 %. Gen: No apparent  distress Neuro: No gross focal deficits. HEENT: No JVD, lymphadenopathy, thyromegaly. RS: Clear, No wheeze or crackles CVS: S1-S2 heard, no murmurs rubs gallops. Abdomen: Soft, positive bowel sounds. Musculoskeletal: No edema.    Assessment & Plan:  Consult for abnormal CT scan Active smoker COPD.  The CT images were reviewed. The opacities are consistent with an infectious/inflammatory process. The areas of most concern including the mass like consolidations in the lower lobes were not seen on the lung images of CT of the abdomen from the previous month. He may have chronic aspiration. Malignancy is a possibility given his extensive smoking history. Other considerations include interstitial lung disease from smoking such as RB ILD or DIP, COP.  I'll treat him empirically with a prolonged course of antibiotic, Levaquin for 14 days, even though he does not show overt signs of infection. He'll get a repeat CT scan in 1 month as a short-term follow-up and a swallow eval. If abnormalities persist then he may be a candidate for PET and CT guided biopsy. I will send for PFTs to get an assessment of lung function.  Plan: - Prescribe a course of levaquin - Repeat CT scan - Swallow eval  Follow up in 1 month.  Chilton GreathousePraveen Yobana Culliton MD [facility] Pager [phone] If no answer or after 3pm call: [phone] 06/02/2016, 5:14 PM

## 2016-06-02 NOTE — Patient Instructions (Addendum)
If you are age 76 or older, your body mass index should be between 23-30. Your Body mass index is 21.82 kg/(m^2). If this is out of the aforementioned range listed, please consider follow up with your Primary Care Provider.  If you are age 76 or younger, your body mass index should be between 19-25. Your Body mass index is 21.82 kg/(m^2). If this is out of the aformentioned range listed, please consider follow up with your Primary Care Provider.   [facility]. Your appointment time is 1:00pm. Please arrive 15 minutes prior to your appointment time for registration purposes. Please make certain not to have anything to eat or drink 6 hours prior to your test. In addition, if you have any metal in your body, have a pacemaker or defibrillator, please be sure to let your ordering physician know. This test typically takes 45 minutes to 1 hour to complete.  Thank you for choosing [facility]  Dr Yancey FlemingsJohn Perry

## 2016-06-02 NOTE — Progress Notes (Addendum)
HISTORY OF PRESENT ILLNESS:  [name] is a 75 y.o. male with MULTIPLE SIGNIFICANT medical problems including but not limited to hypertension, diabetes, hyperlipidemia, carotid artery disease status post endarterectomy 2, peripheral vascular disease status post surgical bypass, COPD, atrial fibrillation, chronic anticoagulation therapy, history of deep vein thrombosis, and arthritis. I have not seen the patient in many years. Followed previously for GERD and alcohol-related pancreatitis. Last evaluated May 2008 to assess normocytic anemia and Hemoccult-positive stool. Upper endoscopy was unremarkable except for small hiatal hernia. Complete colonoscopy with intubation of the terminal ileum is normal except for mild diverticulosis. The patient is sent today by his primary care provider Mr. Loraine Leriche Helper PA-C with a chief complaint of abnormal imaging, gallstones, and weight loss. Patient has been getting significant care from the [facility] hospital system. He is accompanied today by his wife. They tell me that his problem with weight loss has occurred over the past 2-3 years. They report 60 pound weight loss. 10 pound weight loss this year. There has been no associated nausea, vomiting, or abdominal pain. Outside blood work from 03/19/2016 revealed hemoglobin 10.7, white blood cell count 12.7, normal platelets, hemoglobin A1c 7.1, BUN 23, creatinine 1.37, albumin 3.3, normal transaminases. Mildly elevated alkaline phosphatase 206 and bilirubin 1.4. Because of unexplained weight loss they requested more detailed evaluation. This included contrast-enhanced CT scan of the abdomen and pelvis performed 03/26/2016. He was found to have cholelithiasis. Common duct was dilated at 14 mm. Possible tiny choledocholithiasis noted. Abnormal infiltrates at the lung base were noted. He subsequently underwent CT scan of the chest 04/26/2016. Significant findings were infiltrative changes in the lungs likely infection but could not  rule out neoplasia. 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 For the pulmonary abnormality he is seeing a pulmonologist today. For the thoracic aneurysm, he has been evaluated by Dr. Tyrone Sage with potential intervention planned. The patient and his wife also tell me that his diabetic medication Acarbose was discontinued after long-term use because he felt this affected his appetite. He is been off medication for about one month and reports 2 pound weight gain and that her appetite. Again he denies any abdominal pain. He does have chronic stable constipation.  REVIEW OF SYSTEMS:  All non-GI ROS negative except for back pain  Past Medical History  Diagnosis Date  . Anemia   . Hypertension   . Diabetes mellitus   . Hyperlipidemia   . Carotid artery occlusion     Carotid endarterectomy 2002 and 2004  . GERD (gastroesophageal reflux disease)   . Anxiety   . PAD (peripheral artery disease) (HCC)     Fem-Fem by-pass graft 1991. redo patch angioplasty in 2000 and 2004.  LEA dopplers: 04/2012- R-ABI 1.34, L-ABI 0.97, patent Left lower extremity graft.  . Barrett esophagus   . History of stress test     March 2012 post stress ejection fraction 56%, negative for ischemia.  Last 2-D echocardiogram March 2012 younger than 55% mild concentric left ventricular hypertrophy grade 1 diastolic dysfunction. Mildly dilated left atrium. 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  . COPD (chronic obstructive pulmonary disease) (HCC)   . Stroke [facility]) March 2016    Lihgt Stroke  . Arthritis     Gout  . Atrial fibrillation (HCC)   . Erectile dysfunction   . Gout   . Lumbar stenosis   . Diverticulosis   . Hiatal hernia   . AAA (abdominal aortic aneurysm) (HCC)   . DVT (deep venous thrombosis) (HCC)   .  Pancreatitis     Past Surgical History  Procedure Laterality Date  . Pr vein bypass graft,aorto-fem-pop  2007    Left Fem-post. tibial  . Pr vein bypass graft,aorto-fem-pop  1999-2004    numerous revision Left Fem-pop   . Carotid endarterectomy  2002    Left CEA  . Carotid endarterectomy  2007    Left CEA redo  . Cataract extraction Left 02-02-95  . Cataract extraction Right 04-20-95  . Lumbar disc surgery  1980    Social History Mccabe Kerri PerchesL Loomis  reports that he has been smoking Cigarettes and E-cigarettes.  He has a 19.8 pack-year smoking history. He quit smokeless tobacco use about 48 years ago. His smokeless tobacco use included Chew. He reports that he does not drink alcohol or use illicit drugs.  family history includes Cancer in his sister; Diabetes in his brother; Heart attack in his father; Heart disease in his father; Hyperlipidemia in his father; Hypertension in his brother and father.  Allergies  Allergen Reactions  . Lopressor [Metoprolol Tartrate] Palpitations    LOPRESSOR bothers the patient.  . Niacin     Other reaction(s): Facial Edema (intolerance)       PHYSICAL EXAMINATION: Vital signs: BP 90/60 mmHg  Pulse 100  Ht 5' 6.25" (1.683 m)  Wt 136 lb 4 oz (61.803 kg)  BMI 21.82 kg/m2  Constitutional: Chronically ill-appearing, thin, weathered, no acute distress Psychiatric: alert and oriented x3, cooperative Eyes: extraocular movements intact, anicteric, conjunctiva pink Mouth: oral pharynx moist, no lesions. Dentures Neck: supple without thyromegaly Lymph: no lymphadenopathy Cardiovascular: heart regular rate and rhythm, no murmur Lungs: clear to auscultation bilaterally Abdomen: soft, nontender, nondistended, no obvious ascites, no peritoneal signs, normal bowel sounds, no organomegaly Rectal: Omitted Extremities: no clubbing cyanosis or lower extremity edema bilaterally. Chronic stasis changes present Skin: Multiple ecchymoses. Otherwise no lesions on visible extremities of clinical relevance Neuro: No focal deficits. Cranial nerves intact  ASSESSMENT:  #1. Cholelithiasis with possible choledocholithiasis and biliary dilation. Appears asymptomatic #2. Mild elevation of  alkaline phosphatase. May be related to #1 #3. Multiple chronic medical problems of significance #4. Chronic anticoagulation #5. Recently identified pulmonary abnormalities being evaluated #6. Progressive thoracic aneurysm. Has been recently evaluated with intervention planned pending pulmonary workup   PLAN:  #1. I discussed evaluation of the biliary tree to include MRCP, ERCP, and endoscopic ultrasound #2. Given his multiple medical problems, unresolved issues with his lungs and6 aneurysm, and asymptomatic nature regarding biliary abnormalities, we have opted for the noninvasive MRCP as the next step. We can evaluate further for choledocholithiasis (confirm) and possible distal biliary obstruction such as ampullary neoplasm. We will contact the patient and his wife with the results when available #3. Complete pulmonary and thoracic surgery evaluation/interventions #4. GI follow-up and plans to be determined after the above addressed and completed. #5. Repeat blood work today including comprehensive metabolic panel and CBC A copy of this consultation note has been sent to [employer] Net[facility] Helper PA-C, Dr. Sheliah PlaneEdward Gerhardt, and Dr. Concepcion ElkPaveen Mannam  LABORATORY BLOOD WORK Laboratory blood work has returned from today. LFT abnormalities have completely resolved. CBC stable. Renal insufficiency noted

## 2016-06-02 NOTE — Patient Instructions (Signed)
We will give a course of levaquin 500 mg/day for 14 days. Repeat CT of chest in 1 month Schedule for PFTs Return to clinic after these tests to review and plan for further treatment as needed.

## 2016-06-02 NOTE — Progress Notes (Signed)
[facility]  Patient name: [name] MRN: [id number] DOB: [birth year] Sex: male  REFERRING PHYSICIAN: Dr. Tyrone Sage  REASON FOR CONSULT: TAAA  HPI: [name] is a 75 y.o. male, who is a former patient of Dr. Hart Rochester.  He has a history of left carotid endarterectomy in 2001 and then a redo left carotid endarterectomy.  He has also undergone a femoral to posterior tibial bypass graft with arm vein.  He has had a history of right brain stroke.  He suffers from atrial fibrillation and is on Coumadin.  He is a diabetic.  He has recently changed medications and states that his blood sugars are under much better control, around 100.  He is medically managed for hypercholesterolemia with a statin.  He is on multiple blood pressure medications.  He takes Pletal for peripheral vascular disease.  He is a smoker.  He reports a significant weight loss.  During evaluation for this he had a CT scan which revealed a 5.5 cm descending thoracic aortic aneurysm.  When he saw Dr. Nydia Bouton, there was also aneurysmal changes to his femoral-popliteal bypass graft.  Past Medical History  Diagnosis Date  . Anemia   . Hypertension   . Diabetes mellitus   . Hyperlipidemia   . Carotid artery occlusion     Carotid endarterectomy 2002 and 2004  . GERD (gastroesophageal reflux disease)   . Anxiety   . PAD (peripheral artery disease) (HCC)     Fem-Fem by-pass graft 1991. redo patch angioplasty in 2000 and 2004.  LEA dopplers: 04/2012- R-ABI 1.34, L-ABI 0.97, patent Left lower extremity graft.  . Barrett esophagus   . History of stress test     March 2012 post stress ejection fraction 56%, negative for ischemia.  Last 2-D echocardiogram March 2012 younger than 55% mild concentric left ventricular hypertrophy grade 1 diastolic dysfunction. Mildly dilated left atrium. 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  . COPD (chronic obstructive pulmonary disease) (HCC)     . Stroke [facility]) March 2016    Lihgt Stroke  . Arthritis     Gout  . Atrial fibrillation (HCC)   . Erectile dysfunction   . Gout   . Lumbar stenosis   . Diverticulosis   . Hiatal hernia     Family History  Problem Relation Age of Onset  . Heart disease Father   . Heart attack Father     X's 3  . Hyperlipidemia Father   . Hypertension Father   . Diabetes Brother   . Hypertension Brother   . Cancer Sister     SOCIAL HISTORY: Social History   Social History  . Marital Status: Married    Spouse Name: N/A  . Number of Children: N/A  . Years of Education: N/A   Occupational History  . Not on file.   Social History Main Topics  . Smoking status: Light Tobacco Smoker -- 0.25 packs/day for 50 years    Types: Cigarettes, E-cigarettes  . Smokeless tobacco: Former Neurosurgeon    Types: Chew    Quit date: 05/21/1968  . Alcohol Use: No  . Drug Use: No  . Sexual Activity: Not on file   Other Topics Concern  . Not on file   Social History Narrative   Married   3 daughters   Education: 10th grade   Occupation: disabled   Religion: [employer]   Caffeine: rare       Allergies  Allergen Reactions  . Lopressor [Metoprolol Tartrate]  Palpitations    LOPRESSOR bothers the patient.    Current Outpatient Prescriptions  Medication Sig Dispense Refill  . ALPRAZolam (XANAX) 0.5 MG tablet Take 0.5 mg by mouth at bedtime as needed.    Marland Kitchen. amLODipine (NORVASC) 5 MG tablet Take by mouth. Take 0.5 tablet daily    . aspirin 81 MG tablet Take 81 mg by mouth daily.    Marland Kitchen. atorvastatin (LIPITOR) 80 MG tablet Take 80 mg by mouth. Takes 0.5 tablet daily.    . cilostazol (PLETAL) 100 MG tablet Take 100 mg by mouth 2 (two) times daily.    . cloNIDine (CATAPRES) 0.3 MG tablet Take 0.1 mg by mouth 3 (three) times daily.   6  . docusate sodium (COLACE) 100 MG capsule Take 100 mg by mouth 2 (two) times daily.    . Ferrous Fumarate-Folic Acid (FERROCITE F PO) Take by mouth daily.    Marland Kitchen. glipiZIDE  (GLUCOTROL) 5 MG tablet Take 5 mg by mouth 2 (two) times daily before a meal.    . indomethacin (INDOCIN) 25 MG capsule Take 25 mg by mouth as needed.    . lidocaine (LIDODERM) 5 % Place 2 patches onto the skin daily. Remove & Discard patch within 12 hours or as directed by MD    . losartan (COZAAR) 100 MG tablet Take 100 mg by mouth daily.    . metoprolol tartrate (LOPRESSOR) 25 MG tablet TAKE 1 TABLET BY MOUTH TWICE DAILY 180 tablet 0  . omega-3 acid ethyl esters (LOVAZA) 1 G capsule Take by mouth 2 (two) times daily. Takes 2 tablets    . oxycodone (ROXICODONE) 30 MG immediate release tablet Take 15 mg by mouth every 8 (eight) hours as needed.     . pantoprazole (PROTONIX) 40 MG tablet Take 40 mg by mouth 2 (two) times daily.     . propafenone (RYTHMOL) 225 MG tablet TAKE 1 TABLET BY MOUTH EVERY 8 HOURS 90 tablet 1  . saxagliptin HCl (ONGLYZA) 2.5 MG TABS tablet Take 2.5 mg by mouth daily.    Marland Kitchen. testosterone cypionate (DEPOTESTOTERONE CYPIONATE) 200 MG/ML injection Inject 100 mg into the skin every 14 (fourteen) days.     . Vitamin D, Ergocalciferol, (DRISDOL) 50000 UNITS CAPS capsule Take 50,000 Units by mouth every 7 (seven) days.    Marland Kitchen. warfarin (COUMADIN) 3 MG tablet 4 mg daily. As Directed by INR     No current facility-administered medications for this visit.    REVIEW OF SYSTEMS:  [X]  denotes positive finding, [ ]  denotes negative finding Cardiac  Comments:  Chest pain or chest pressure: x   Shortness of breath upon exertion:    Short of breath when lying flat:    Irregular heart rhythm:        Vascular    Pain in calf, thigh, or hip brought on by ambulation: x   Pain in feet at night that wakes you up from your sleep:     Blood clot in your veins:    Leg swelling:         Pulmonary    Oxygen at home:    Productive cough:     Wheezing:         Neurologic    Sudden weakness in arms or legs:  x   Sudden numbness in arms or legs:     Sudden onset of difficulty speaking or  slurred speech:    Temporary loss of vision in one eye:     Problems with  dizziness:         Gastrointestinal    Blood in stool:     Vomited blood:         Genitourinary    Burning when urinating:     Blood in urine:        Psychiatric    Major depression:         Hematologic    Bleeding problems:    Problems with blood clotting too easily:        Skin    Rashes or ulcers:        Constitutional    Fever or chills: x Weight loss     PHYSICAL EXAM: Filed Vitals:   06/02/16 0937  BP: 109/71  Pulse: 100  Temp: 97.7 F (36.5 C)  TempSrc: Oral  Resp: 18  Height: 5' 9.5" (1.765 m)  Weight: 135 lb 12.8 oz (61.598 kg)  SpO2: 98%    GENERAL: The patient is a well-nourished male, in no acute distress. The vital signs are documented above. CARDIAC: There is a regular rate and rhythm.  VASCULAR: Palpable right femoral pulse.  Palpable pulse within the left posterior tibial bypass graft with aneurysmal changes in the mid thigh. PULMONARY: There is good air exchange bilaterally without wheezing or rales. ABDOMEN: Soft and non-tender with normal pitched bowel sounds.  MUSCULOSKELETAL: There are no major deformities or cyanosis. NEUROLOGIC: No focal weakness or paresthesias are detected. SKIN: There are no ulcers or rashes noted. PSYCHIATRIC: The patient has a normal affect.  DATA:  I have reviewed his CT scan which shows a 5.5 cm descending thoracic aortic aneurysm with heavily calcified vessels.  MEDICAL ISSUES: Thoracic aneurysm: I believe the patient is a candidate for endovascular repair.  He would need a retroperitoneal conduit.  I believe the right side would be the preferred side.  It would need to be sewn onto the common iliac artery.  We discussed the risks and benefits of the operation including the risk of stroke, death, paralysis, bleeding.  I will try to correlate with Dr. Tyrone Sage to get this scheduled.  The patient is on Coumadin which will need to be discontinued  prior to his operation.  He also takes Pletal which will need to be discontinued.  I am sending him to see Dr. Tresa Endo for formal cardiac clearance and discussions regarding Lovenox bridging prior to his surgery.  Weight loss: The patient is currently being evaluated for possible pulmonary issues.  He is scheduled to see a pulmonologist today.  The results of this will dictate the timing for the thoracic aneurysm repair  Aneurysmal changes to left femoral posterior tibial bypass graft: I believe this will need to be addressed but would do this after his thoracic aneurysm has been repaired here this would need to be done as a separate operation.  I feel that with the tortuous nature of the vein graft, that I would be able to resect the aneurysm and perform primary anastomosis.   Durene Cal, MD [facility] [phone] Pager [phone]

## 2016-06-07 ENCOUNTER — Other Ambulatory Visit: Payer: Self-pay | Admitting: Internal Medicine

## 2016-06-07 ENCOUNTER — Ambulatory Visit (HOSPITAL_COMMUNITY)
Admission: RE | Admit: 2016-06-07 | Discharge: 2016-06-07 | Disposition: A | Discharge status: Home / Self Care | Payer: Medicare Other | Source: Ambulatory Visit | Attending: Internal Medicine | Admitting: Internal Medicine

## 2016-06-07 DIAGNOSIS — K8050 Calculus of bile duct without cholangitis or cholecystitis without obstruction: Secondary | ICD-10-CM | POA: Diagnosis not present

## 2016-06-07 DIAGNOSIS — K831 Obstruction of bile duct: Secondary | ICD-10-CM

## 2016-06-07 DIAGNOSIS — K8070 Calculus of gallbladder and bile duct without cholecystitis without obstruction: Secondary | ICD-10-CM | POA: Insufficient documentation

## 2016-06-07 DIAGNOSIS — K8689 Other specified diseases of pancreas: Secondary | ICD-10-CM | POA: Diagnosis not present

## 2016-06-08 ENCOUNTER — Telehealth: Payer: Self-pay | Admitting: Internal Medicine

## 2016-06-08 ENCOUNTER — Telehealth: Payer: Self-pay | Admitting: Cardiovascular Disease

## 2016-06-08 NOTE — Telephone Encounter (Signed)
ASAP clearance request routed to provider. His last appt w Dr. Tresa Endo was 02/2015 w recommendation for 1 yr f/u.

## 2016-06-08 NOTE — Telephone Encounter (Signed)
Called and left results on voice mail for patient's wife as requested.

## 2016-06-09 NOTE — Telephone Encounter (Signed)
This is a follow up by Marcelino Duster on the surg clearance.

## 2016-06-12 ENCOUNTER — Inpatient Hospital Stay (HOSPITAL_COMMUNITY)
Admission: EM | Admit: 2016-06-12 | Discharge: 2016-06-19 | DRG: 314 | Disposition: A | Discharge status: Home / Self Care | Payer: Medicare Other | Attending: Vascular Surgery | Admitting: Vascular Surgery

## 2016-06-12 ENCOUNTER — Encounter (HOSPITAL_COMMUNITY): Payer: Self-pay

## 2016-06-12 DIAGNOSIS — M109 Gout, unspecified: Secondary | ICD-10-CM | POA: Diagnosis present

## 2016-06-12 DIAGNOSIS — I4891 Unspecified atrial fibrillation: Secondary | ICD-10-CM | POA: Diagnosis present

## 2016-06-12 DIAGNOSIS — Z8679 Personal history of other diseases of the circulatory system: Secondary | ICD-10-CM | POA: Diagnosis not present

## 2016-06-12 DIAGNOSIS — I998 Other disorder of circulatory system: Secondary | ICD-10-CM | POA: Diagnosis present

## 2016-06-12 DIAGNOSIS — R710 Precipitous drop in hematocrit: Secondary | ICD-10-CM | POA: Diagnosis present

## 2016-06-12 DIAGNOSIS — I712 Thoracic aortic aneurysm, without rupture: Secondary | ICD-10-CM | POA: Diagnosis present

## 2016-06-12 DIAGNOSIS — T82868A Thrombosis of vascular prosthetic devices, implants and grafts, initial encounter: Principal | ICD-10-CM | POA: Diagnosis present

## 2016-06-12 DIAGNOSIS — J189 Pneumonia, unspecified organism: Secondary | ICD-10-CM | POA: Diagnosis present

## 2016-06-12 DIAGNOSIS — E785 Hyperlipidemia, unspecified: Secondary | ICD-10-CM | POA: Diagnosis present

## 2016-06-12 DIAGNOSIS — I70222 Atherosclerosis of native arteries of extremities with rest pain, left leg: Secondary | ICD-10-CM | POA: Diagnosis present

## 2016-06-12 DIAGNOSIS — E119 Type 2 diabetes mellitus without complications: Secondary | ICD-10-CM | POA: Diagnosis present

## 2016-06-12 DIAGNOSIS — Z9842 Cataract extraction status, left eye: Secondary | ICD-10-CM | POA: Diagnosis not present

## 2016-06-12 DIAGNOSIS — E440 Moderate protein-calorie malnutrition: Secondary | ICD-10-CM | POA: Diagnosis present

## 2016-06-12 DIAGNOSIS — F17200 Nicotine dependence, unspecified, uncomplicated: Secondary | ICD-10-CM | POA: Diagnosis present

## 2016-06-12 DIAGNOSIS — Z7901 Long term (current) use of anticoagulants: Secondary | ICD-10-CM | POA: Diagnosis not present

## 2016-06-12 DIAGNOSIS — Z681 Body mass index (BMI) 19 or less, adult: Secondary | ICD-10-CM | POA: Diagnosis not present

## 2016-06-12 DIAGNOSIS — I10 Essential (primary) hypertension: Secondary | ICD-10-CM | POA: Diagnosis present

## 2016-06-12 DIAGNOSIS — K219 Gastro-esophageal reflux disease without esophagitis: Secondary | ICD-10-CM | POA: Diagnosis present

## 2016-06-12 DIAGNOSIS — Z7982 Long term (current) use of aspirin: Secondary | ICD-10-CM

## 2016-06-12 DIAGNOSIS — D696 Thrombocytopenia, unspecified: Secondary | ICD-10-CM | POA: Diagnosis present

## 2016-06-12 DIAGNOSIS — J449 Chronic obstructive pulmonary disease, unspecified: Secondary | ICD-10-CM | POA: Diagnosis present

## 2016-06-12 DIAGNOSIS — M549 Dorsalgia, unspecified: Secondary | ICD-10-CM | POA: Diagnosis present

## 2016-06-12 DIAGNOSIS — Z9841 Cataract extraction status, right eye: Secondary | ICD-10-CM

## 2016-06-12 DIAGNOSIS — T82898A Other specified complication of vascular prosthetic devices, implants and grafts, initial encounter: Secondary | ICD-10-CM

## 2016-06-12 DIAGNOSIS — E876 Hypokalemia: Secondary | ICD-10-CM | POA: Diagnosis present

## 2016-06-12 DIAGNOSIS — Z8673 Personal history of transient ischemic attack (TIA), and cerebral infarction without residual deficits: Secondary | ICD-10-CM | POA: Diagnosis not present

## 2016-06-12 DIAGNOSIS — E7800 Pure hypercholesterolemia, unspecified: Secondary | ICD-10-CM | POA: Diagnosis present

## 2016-06-12 DIAGNOSIS — G8929 Other chronic pain: Secondary | ICD-10-CM | POA: Diagnosis present

## 2016-06-12 DIAGNOSIS — Z7902 Long term (current) use of antithrombotics/antiplatelets: Secondary | ICD-10-CM

## 2016-06-12 DIAGNOSIS — I739 Peripheral vascular disease, unspecified: Secondary | ICD-10-CM | POA: Diagnosis present

## 2016-06-12 LAB — CBC WITH DIFFERENTIAL/PLATELET
BASOS ABS: 0 10*3/uL (ref 0.0–0.1)
BASOS PCT: 0 %
EOS PCT: 3 %
Eosinophils Absolute: 0.3 10*3/uL (ref 0.0–0.7)
HCT: 34.9 % — ABNORMAL LOW (ref 39.0–52.0)
Hemoglobin: 11.7 g/dL — ABNORMAL LOW (ref 13.0–17.0)
Lymphocytes Relative: 21 %
Lymphs Abs: 2.1 10*3/uL (ref 0.7–4.0)
MCH: 30.2 pg (ref 26.0–34.0)
MCHC: 33.5 g/dL (ref 30.0–36.0)
MCV: 89.9 fL (ref 78.0–100.0)
MONO ABS: 0.9 10*3/uL (ref 0.1–1.0)
Monocytes Relative: 9 %
NEUTROS ABS: 6.7 10*3/uL (ref 1.7–7.7)
Neutrophils Relative %: 67 %
PLATELETS: 144 10*3/uL — AB (ref 150–400)
RBC: 3.88 MIL/uL — ABNORMAL LOW (ref 4.22–5.81)
RDW: 16 % — AB (ref 11.5–15.5)
WBC: 9.9 10*3/uL (ref 4.0–10.5)

## 2016-06-12 LAB — HEPARIN LEVEL (UNFRACTIONATED): HEPARIN UNFRACTIONATED: 0.25 [IU]/mL — AB (ref 0.30–0.70)

## 2016-06-12 LAB — COMPREHENSIVE METABOLIC PANEL
ALBUMIN: 3.1 g/dL — AB (ref 3.5–5.0)
ALK PHOS: 64 U/L (ref 38–126)
ALT: 18 U/L (ref 17–63)
ANION GAP: 6 (ref 5–15)
AST: 30 U/L (ref 15–41)
BILIRUBIN TOTAL: 0.8 mg/dL (ref 0.3–1.2)
BUN: 28 mg/dL — ABNORMAL HIGH (ref 6–20)
CALCIUM: 9.4 mg/dL (ref 8.9–10.3)
CO2: 24 mmol/L (ref 22–32)
CREATININE: 1.46 mg/dL — AB (ref 0.61–1.24)
Chloride: 105 mmol/L (ref 101–111)
GFR calc non Af Amer: 45 mL/min — ABNORMAL LOW (ref >60)
GFR, EST AFRICAN AMERICAN: 52 mL/min — AB (ref >60)
Glucose, Bld: 161 mg/dL — ABNORMAL HIGH (ref 65–99)
Potassium: 4.5 mmol/L (ref 3.5–5.1)
Sodium: 135 mmol/L (ref 135–145)
TOTAL PROTEIN: 7 g/dL (ref 6.5–8.1)

## 2016-06-12 LAB — PROTIME-INR
INR: 1.64
Prothrombin Time: 19.6 seconds — ABNORMAL HIGH (ref 11.4–15.2)

## 2016-06-12 LAB — MRSA PCR SCREENING: MRSA BY PCR: NEGATIVE

## 2016-06-12 MED ORDER — PROPAFENONE HCL 225 MG PO TABS
225.0000 mg | ORAL_TABLET | Freq: Two times a day (BID) | ORAL | Status: DC
  Administered 2016-06-12 – 2016-06-19 (×14): 225 mg via ORAL
  Filled 2016-06-12 (×15): qty 1

## 2016-06-12 MED ORDER — HEPARIN (PORCINE) IN NACL 100-0.45 UNIT/ML-% IJ SOLN
1050.0000 [IU]/h | INTRAMUSCULAR | Status: DC
  Administered 2016-06-12: 900 [IU]/h via INTRAVENOUS
  Filled 2016-06-12: qty 250

## 2016-06-12 MED ORDER — POTASSIUM CHLORIDE CRYS ER 20 MEQ PO TBCR: 20.0000 meq | EXTENDED_RELEASE_TABLET | Freq: Once | ORAL | Status: DC

## 2016-06-12 MED ORDER — METOPROLOL TARTRATE 25 MG PO TABS
25.0000 mg | ORAL_TABLET | Freq: Two times a day (BID) | ORAL | Status: DC
  Administered 2016-06-13 – 2016-06-19 (×13): 25 mg via ORAL
  Filled 2016-06-12 (×13): qty 1

## 2016-06-12 MED ORDER — HEPARIN BOLUS VIA INFUSION
3000.0000 [IU] | Freq: Once | INTRAVENOUS | Status: AC
  Administered 2016-06-12: 3000 [IU] via INTRAVENOUS
  Filled 2016-06-12: qty 3000

## 2016-06-12 MED ORDER — HYDRALAZINE HCL 20 MG/ML IJ SOLN
5.0000 mg | INTRAMUSCULAR | Status: AC | PRN
  Administered 2016-06-14 – 2016-06-15 (×2): 5 mg via INTRAVENOUS
  Filled 2016-06-12 (×2): qty 1

## 2016-06-12 MED ORDER — METOPROLOL TARTRATE 25 MG PO TABS
25.0000 mg | ORAL_TABLET | Freq: Two times a day (BID) | ORAL | Status: DC
  Administered 2016-06-12: 25 mg via ORAL
  Filled 2016-06-12: qty 1

## 2016-06-12 MED ORDER — METOPROLOL TARTRATE 5 MG/5ML IV SOLN
2.0000 mg | INTRAVENOUS | Status: AC | PRN
  Administered 2016-06-15 (×2): 2.5 mg via INTRAVENOUS
  Filled 2016-06-12 (×2): qty 5

## 2016-06-12 MED ORDER — PANTOPRAZOLE SODIUM 40 MG PO TBEC
40.0000 mg | DELAYED_RELEASE_TABLET | Freq: Two times a day (BID) | ORAL | Status: DC
  Administered 2016-06-12 – 2016-06-19 (×16): 40 mg via ORAL
  Filled 2016-06-12 (×16): qty 1

## 2016-06-12 MED ORDER — ASPIRIN EC 81 MG PO TBEC
81.0000 mg | DELAYED_RELEASE_TABLET | Freq: Every day | ORAL | Status: DC
  Administered 2016-06-13 – 2016-06-19 (×7): 81 mg via ORAL
  Filled 2016-06-12 (×8): qty 1

## 2016-06-12 MED ORDER — ALPRAZOLAM 0.25 MG PO TABS
0.2500 mg | ORAL_TABLET | Freq: Every evening | ORAL | Status: DC | PRN
Start: 2016-06-12 — End: 2016-06-19
  Administered 2016-06-14 – 2016-06-17 (×2): 0.25 mg via ORAL
  Filled 2016-06-12 (×3): qty 1

## 2016-06-12 MED ORDER — CILOSTAZOL 100 MG PO TABS
100.0000 mg | ORAL_TABLET | Freq: Two times a day (BID) | ORAL | Status: DC
  Administered 2016-06-12 – 2016-06-19 (×14): 100 mg via ORAL
  Filled 2016-06-12 (×15): qty 1

## 2016-06-12 MED ORDER — WHITE PETROLATUM GEL
Status: AC
  Administered 2016-06-12: 0.2
  Filled 2016-06-12: qty 1

## 2016-06-12 MED ORDER — GLIPIZIDE 5 MG PO TABS
5.0000 mg | ORAL_TABLET | Freq: Two times a day (BID) | ORAL | Status: DC
  Administered 2016-06-13 – 2016-06-19 (×10): 5 mg via ORAL
  Filled 2016-06-12 (×13): qty 1

## 2016-06-12 MED ORDER — ONDANSETRON HCL 4 MG/2ML IJ SOLN: 4.0000 mg | Freq: Four times a day (QID) | INTRAMUSCULAR | Status: DC | PRN

## 2016-06-12 MED ORDER — ALUM & MAG HYDROXIDE-SIMETH 200-200-20 MG/5ML PO SUSP: 15.0000 mL | ORAL | Status: DC | PRN

## 2016-06-12 MED ORDER — SODIUM CHLORIDE 0.9 % IV SOLN
INTRAVENOUS | Status: DC
  Administered 2016-06-12: 15:00:00 via INTRAVENOUS

## 2016-06-12 MED ORDER — VITAMIN D (ERGOCALCIFEROL) 1.25 MG (50000 UNIT) PO CAPS: 50000.0000 [IU] | ORAL_CAPSULE | ORAL | Status: DC

## 2016-06-12 MED ORDER — LOSARTAN POTASSIUM 50 MG PO TABS
100.0000 mg | ORAL_TABLET | Freq: Every day | ORAL | Status: DC
  Administered 2016-06-13 – 2016-06-19 (×7): 100 mg via ORAL
  Filled 2016-06-12 (×7): qty 2

## 2016-06-12 MED ORDER — CLONIDINE HCL 0.1 MG PO TABS
0.1000 mg | ORAL_TABLET | Freq: Three times a day (TID) | ORAL | Status: DC
  Administered 2016-06-12 – 2016-06-19 (×21): 0.1 mg via ORAL
  Filled 2016-06-12 (×21): qty 1

## 2016-06-12 MED ORDER — LEVOFLOXACIN 500 MG PO TABS
500.0000 mg | ORAL_TABLET | Freq: Every day | ORAL | Status: DC
  Administered 2016-06-12: 500 mg via ORAL
  Filled 2016-06-12: qty 1

## 2016-06-12 MED ORDER — AMLODIPINE BESYLATE 5 MG PO TABS
5.0000 mg | ORAL_TABLET | Freq: Every day | ORAL | Status: DC
  Administered 2016-06-13 – 2016-06-19 (×8): 5 mg via ORAL
  Filled 2016-06-12 (×7): qty 1

## 2016-06-12 MED ORDER — PANTOPRAZOLE SODIUM 40 MG PO TBEC: 40.0000 mg | DELAYED_RELEASE_TABLET | Freq: Every day | ORAL | Status: DC

## 2016-06-12 MED ORDER — LINAGLIPTIN 5 MG PO TABS
5.0000 mg | ORAL_TABLET | Freq: Every day | ORAL | Status: DC
  Administered 2016-06-13 – 2016-06-19 (×6): 5 mg via ORAL
  Filled 2016-06-12 (×6): qty 1

## 2016-06-12 MED ORDER — ATORVASTATIN CALCIUM 40 MG PO TABS
40.0000 mg | ORAL_TABLET | Freq: Every day | ORAL | Status: DC
  Administered 2016-06-12 – 2016-06-18 (×7): 40 mg via ORAL
  Filled 2016-06-12 (×7): qty 1

## 2016-06-12 MED ORDER — SODIUM CHLORIDE 0.45 % IV SOLN
INTRAVENOUS | Status: DC
  Administered 2016-06-12: 16:00:00 via INTRAVENOUS
  Administered 2016-06-13: 100 mL/h via INTRAVENOUS
  Administered 2016-06-15 – 2016-06-17 (×6): via INTRAVENOUS

## 2016-06-12 MED ORDER — LEVOFLOXACIN 500 MG PO TABS
500.0000 mg | ORAL_TABLET | ORAL | Status: AC
  Administered 2016-06-14 – 2016-06-16 (×2): 500 mg via ORAL
  Filled 2016-06-12 (×2): qty 1

## 2016-06-12 MED ORDER — DOCUSATE SODIUM 100 MG PO CAPS
200.0000 mg | ORAL_CAPSULE | Freq: Two times a day (BID) | ORAL | Status: DC
  Administered 2016-06-12 – 2016-06-18 (×11): 200 mg via ORAL
  Filled 2016-06-12 (×14): qty 2

## 2016-06-12 MED ORDER — MORPHINE SULFATE (PF) 4 MG/ML IV SOLN
4.0000 mg | Freq: Once | INTRAVENOUS | Status: AC
  Administered 2016-06-12: 4 mg via INTRAVENOUS
  Filled 2016-06-12: qty 1

## 2016-06-12 MED ORDER — LABETALOL HCL 5 MG/ML IV SOLN
10.0000 mg | INTRAVENOUS | Status: AC | PRN
  Administered 2016-06-14 (×4): 10 mg via INTRAVENOUS
  Filled 2016-06-12 (×3): qty 4

## 2016-06-12 MED ORDER — OXYCODONE HCL 5 MG PO TABS
15.0000 mg | ORAL_TABLET | Freq: Three times a day (TID) | ORAL | Status: DC | PRN
  Administered 2016-06-12 – 2016-06-15 (×7): 15 mg via ORAL
  Filled 2016-06-12 (×7): qty 3

## 2016-06-12 MED ORDER — VITAMIN D (ERGOCALCIFEROL) 1.25 MG (50000 UNIT) PO CAPS
50000.0000 [IU] | ORAL_CAPSULE | ORAL | Status: DC
  Administered 2016-06-18: 50000 [IU] via ORAL
  Filled 2016-06-12: qty 1

## 2016-06-12 NOTE — ED Notes (Signed)
[name] , [place] updated pt.s daughters and wife on plan of care

## 2016-06-12 NOTE — Consult Note (Signed)
Chief Complaint: Patient was seen in consultation today for  Chief Complaint  Patient presents with  . Poor Circulation   at the request of Dr. Fabienne Bruns  Referring Physician(s): Fabienne Bruns, MD   Patient Status: Inpatient  History of Present Illness: [name] is a 75 y.o. male with multiple medical problems including peripheral vascular disease and s/p left femoral to posterior tibial artery bypass with arm vein.  Patient has recently been worked up for weight loss and lung opacities and treated with Augmentin.  Patient presents with new onset of left calf pain and coolness in left foot.  Patient has been evaluated by Vascular Surgery and suspect distal occlusion of the left leg bypass.  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      Past Medical History:  Diagnosis Date  . AAA (abdominal aortic aneurysm) (HCC)   . Anemia   . Anxiety   . Arthritis    Gout  . Atrial fibrillation (HCC)   . Barrett esophagus   . Carotid artery occlusion    Carotid endarterectomy 2002 and 2004  . COPD (chronic obstructive pulmonary disease) (HCC)   . Diabetes mellitus   . Diverticulosis   . DVT (deep venous thrombosis) (HCC)   . Erectile dysfunction   . GERD (gastroesophageal reflux disease)   . Gout   . Hiatal hernia   . History of stress test    March 2012 post stress ejection fraction 56%, negative for ischemia.  Last 2-D echocardiogram March 2012 younger than 55% mild concentric left ventricular hypertrophy grade 1 diastolic dysfunction. Mildly dilated left atrium. 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  . Hyperlipidemia   . Hypertension   . Lumbar stenosis   . PAD (peripheral artery disease) (HCC)    Fem-Fem by-pass graft 1991. redo patch angioplasty in 2000 and 2004.  LEA dopplers: 04/2012- R-ABI 1.34, L-ABI 0.97, patent Left lower extremity graft.  . Pancreatitis   . Stroke [facility]) March 2016   Lihgt Stroke     Past Surgical History:  Procedure Laterality Date  . CAROTID ENDARTERECTOMY  2002   Left CEA  . CAROTID ENDARTERECTOMY  2007   Left CEA redo  . CATARACT EXTRACTION Left 02-02-95  . CATARACT EXTRACTION Right 04-20-95  . LUMBAR DISC SURGERY  1980  . PR VEIN BYPASS GRAFT,AORTO-FEM-POP  2007   Left Fem-post. tibial  . PR VEIN BYPASS GRAFT,AORTO-FEM-POP  1999-2004   numerous revision Left Fem-pop    Allergies: Lopressor [metoprolol tartrate] and Niacin  Medications: Prior to Admission medications   Medication Sig Start Date End Date Taking? Authorizing Provider  ALPRAZolam Prudy Feeler) 0.5 MG tablet Take 0.25 mg by mouth at bedtime as needed for anxiety.    Yes Historical Provider, MD  amLODipine (NORVASC) 5 MG tablet Take 5 mg by mouth daily.    Yes Historical Provider, MD  aspirin 81 MG tablet Take 81 mg by mouth daily.   Yes Historical Provider, MD  atorvastatin (LIPITOR) 80 MG tablet Take 40 mg by mouth.    Yes Historical Provider, MD  cilostazol (PLETAL) 100 MG tablet Take 100 mg by mouth 2 (two) times daily.   Yes Historical Provider, MD  cloNIDine (CATAPRES) 0.1 MG tablet Take 0.1 mg by mouth 3 (three) times daily.   Yes Historical Provider, MD  docusate sodium (COLACE) 100 MG capsule Take 200 mg by mouth 2 (two) times daily.    Yes Historical Provider, MD  Ferrous Fumarate-Folic Acid (FERROCITE F PO) Take by mouth daily.   Yes Historical Provider, MD  glipiZIDE (GLUCOTROL) 5 MG tablet Take 5 mg by mouth 2 (two) times daily before a meal.   Yes Historical Provider, MD  levofloxacin (LEVAQUIN) 500 MG tablet Take 1 tablet (500 mg total) by mouth daily. 06/02/16  Yes Praveen Mannam, MD  losartan (COZAAR) 100 MG tablet Take 100 mg by mouth daily.   Yes Historical Provider, MD  metoprolol tartrate (LOPRESSOR) 25 MG tablet TAKE 1 TABLET BY MOUTH TWICE DAILY 12/12/15  Yes Lennette Bihari, MD  omega-3 acid ethyl esters (LOVAZA) 1 G capsule Take by mouth 2 (two) times daily. Takes 2 tablets    Yes Historical Provider, MD  oxyCODONE (ROXICODONE) 15 MG immediate release tablet Take 15 mg by mouth every 8 (eight) hours as needed for pain.   Yes Historical Provider, MD  pantoprazole (PROTONIX) 40 MG tablet Take 40 mg by mouth 2 (two) times daily.    Yes Historical Provider, MD  propafenone (RYTHMOL) 225 MG tablet Take 225 mg by mouth 2 (two) times daily.   Yes Historical Provider, MD  saxagliptin HCl (ONGLYZA) 2.5 MG TABS tablet Take 2.5 mg by mouth daily.   Yes Historical Provider, MD  testosterone cypionate (DEPOTESTOTERONE CYPIONATE) 200 MG/ML injection Inject 100 mg into the skin every 14 (fourteen) days.  03/28/12  Yes Historical Provider, MD  Vitamin D, Ergocalciferol, (DRISDOL) 50000 UNITS CAPS capsule Take 50,000 Units by mouth every 7 (seven) days.   Yes Historical Provider, MD  Warfarin Sodium (COUMADIN PO) Take 4 mg by mouth daily. As directed   Yes Historical Provider, MD  cloNIDine (CATAPRES) 0.3 MG tablet Take 0.1 mg by mouth 3 (three) times daily.  02/21/15   Historical Provider, MD  oxycodone (ROXICODONE) 30 MG immediate release tablet Take 15 mg by mouth every 8 (eight) hours as needed.     Historical Provider, MD  propafenone (RYTHMOL) 225 MG tablet TAKE 1 TABLET BY MOUTH EVERY 8 HOURS 01/19/16   Lennette Bihari, MD     Family History  Problem Relation Age of Onset  . Heart disease Father   . Heart attack Father     X's 3  . Hyperlipidemia Father   . Hypertension Father   . Diabetes Brother   . Hypertension Brother   . Cancer Sister     type unknown    Social History   Social History  . Marital status: Married    Spouse name: N/A  . Number of children: 3  . Years of education: N/A   Occupational History  . disabled    Social History Main Topics  . Smoking status: Light Tobacco Smoker    Packs/day: 0.33    Years: 60.00    Types: Cigarettes, E-cigarettes  . Smokeless tobacco: Former Neurosurgeon    Types: Chew    Quit date: 05/21/1968  . Alcohol use No  . Drug  use: No  . Sexual activity: Not Asked   Other Topics Concern  . None   Social History Narrative   Married, lives with spouse   3 daughters   Education: 10th grade   Occupation: disabled from back pain.  Metal work/fabrication x55yrs   Religion: [employer]   Caffeine: rare       Review of Systems  Constitutional: Positive for unexpected weight change.  Respiratory: Negative.   Cardiovascular: Negative.   Gastrointestinal: Negative for abdominal pain.    Vital Signs: BP 174/76  Pulse 70   Temp 98 F (36.7 C) (Oral)   Resp 17   Ht 5\' 6"  (1.676 m)   Wt 136 lb (61.7 kg)   SpO2 100%   BMI 21.95 kg/m   Physical Exam  Cardiovascular: Normal rate, regular rhythm and normal heart sounds.   Pulmonary/Chest: Effort normal and breath sounds normal.  Abdominal: Soft. Bowel sounds are normal.  Musculoskeletal:  Left leg:  Discoloration in left calf and foot.  No ulcerations Superficial and aneurysmal bypass along the medial aspect of the left thigh and left. Proximal aspect of bypass in thigh is pulsatile and patent. No pulse in bypass vein in the mid and distal calf. Unable to feel left PT pulse. Moving left foot. Sensation in foot appears to be baseline for patient.       Imaging: Mr Abdomen Mrcp Wo Cm  Result Date: 06/07/2016 CLINICAL DATA:  Biliary ductal dilatation seen on recent CT. Cholelithiasis and possible choledocholithiasis. Elevated liver function tests. EXAM: MRI ABDOMEN WITHOUT CONTRAST  (INCLUDING MRCP) TECHNIQUE: Multiplanar multisequence MR imaging of the abdomen was performed. Heavily T2-weighted images of the biliary and pancreatic ducts were obtained, and three-dimensional MRCP images were rendered by post processing. COMPARISON:  AP CT on 03/26/2016. FINDINGS: Lower chest:  No acute findings. Hepatobiliary: No mass visualized on this unenhanced exam. Several tiny gallstones are seen, without evidence of gallbladder dilatation or cholecystitis. Exam is  partially degraded by motion artifact. Biliary ductal dilatation seen with common bile duct measuring 10 mm in diameter. A smooth stricture is seen involving the distal common bile duct within the pancreatic head, images 29 and 30/series 5. One or 2 tiny less than 5 mm filling defects are seen within the distal common bile duct just proximal to this stricture, image 66/series 4, consistent with tiny biliary calculi. Pancreas: No mass or inflammatory process visualized on this unenhanced exam. Mild main pancreatic ductal dilatation is seen in the body and tail of the pancreas, with stricture in the pancreatic head and neck, although no definite mass is visualized at this site. This raises suspicion for chronic pancreatitis. Spleen:  Within normal limits in size. Adrenals/Urinary Tract: No adrenal mass identified. No evidence of renal mass or hydronephrosis. Diffuse left renal parenchymal atrophy is again noted. Several tiny sub-cm cysts are noted in both kidneys. Stomach/Bowel:  Unremarkable. Vascular/Lymphatic: No pathologically enlarged lymph nodes identified. No evidence of abdominal aortic aneurysm. Other:  None. Musculoskeletal:  No suspicious bone lesions identified. IMPRESSION: Mild biliary and pancreatic ductal dilatation, with smooth stricture of the distal common bile duct within the pancreatic head. No definite pancreatic mass visualized on this unenhanced exam. This [name] be due to chronic pancreatitis. EUS recommended for further evaluation to exclude the possibility of a small pancreatic neoplasm. Tiny less than 5 mm calculi in distal common bile duct, just proximal to stricture. Cholelithiasis, without evidence of cholecystitis. Electronically Signed   By: Myles Rosenthal M.D.   On: 06/07/2016 16:00  Mr 3d Recon At Scanner  Result Date: 06/07/2016 CLINICAL DATA:  Biliary ductal dilatation seen on recent CT. Cholelithiasis and possible choledocholithiasis. Elevated liver function tests. EXAM: MRI ABDOMEN  WITHOUT CONTRAST  (INCLUDING MRCP) TECHNIQUE: Multiplanar multisequence MR imaging of the abdomen was performed. Heavily T2-weighted images of the biliary and pancreatic ducts were obtained, and three-dimensional MRCP images were rendered by post processing. COMPARISON:  AP CT on 03/26/2016. FINDINGS: Lower chest:  No acute findings. Hepatobiliary: No mass visualized on this unenhanced exam. Several tiny gallstones are  seen, without evidence of gallbladder dilatation or cholecystitis. Exam is partially degraded by motion artifact. Biliary ductal dilatation seen with common bile duct measuring 10 mm in diameter. A smooth stricture is seen involving the distal common bile duct within the pancreatic head, images 29 and 30/series 5. One or 2 tiny less than 5 mm filling defects are seen within the distal common bile duct just proximal to this stricture, image 66/series 4, consistent with tiny biliary calculi. Pancreas: No mass or inflammatory process visualized on this unenhanced exam. Mild main pancreatic ductal dilatation is seen in the body and tail of the pancreas, with stricture in the pancreatic head and neck, although no definite mass is visualized at this site. This raises suspicion for chronic pancreatitis. Spleen:  Within normal limits in size. Adrenals/Urinary Tract: No adrenal mass identified. No evidence of renal mass or hydronephrosis. Diffuse left renal parenchymal atrophy is again noted. Several tiny sub-cm cysts are noted in both kidneys. Stomach/Bowel:  Unremarkable. Vascular/Lymphatic: No pathologically enlarged lymph nodes identified. No evidence of abdominal aortic aneurysm. Other:  None. Musculoskeletal:  No suspicious bone lesions identified. IMPRESSION: Mild biliary and pancreatic ductal dilatation, with smooth stricture of the distal common bile duct within the pancreatic head. No definite pancreatic mass visualized on this unenhanced exam. This [name] be due to chronic pancreatitis. EUS recommended  for further evaluation to exclude the possibility of a small pancreatic neoplasm. Tiny less than 5 mm calculi in distal common bile duct, just proximal to stricture. Cholelithiasis, without evidence of cholecystitis. Electronically Signed   By: Myles Rosenthal M.D.   On: 06/07/2016 16:00   Imaging:  I personally examined left leg bypass with ultrasound.   Proximal aspect of bypass in thigh is patent and aneurysmal. Thrombus in bypass in mid and distal calf. Thrombus appears to extend down to ankle. Unable to visualize anastomosis well with [place].  Labs:  CBC:  Recent Labs  06/02/16 1525 06/12/16 1055  WBC 10.5 9.9  HGB 12.0* 11.7*  HCT 35.8* 34.9*  PLT 178.0 144*    COAGS:  Recent Labs  06/12/16 1055  INR 1.64    BMP:  Recent Labs  06/02/16 1525 06/12/16 1055  NA 135 135  K 4.1 4.5  CL 102 105  CO2 25 24  GLUCOSE 165* 161*  BUN 35* 28*  CALCIUM 9.2 9.4  CREATININE 2.05* 1.46*  GFRNONAA  --  45*  GFRAA  --  52*    LIVER FUNCTION TESTS:  Recent Labs  06/02/16 1525 06/12/16 1055  BILITOT 0.4 0.8  AST 27 30  ALT 16 18  ALKPHOS 71 64  PROT 7.4 7.0  ALBUMIN 3.7 3.1*    TUMOR MARKERS: No results for input(s): AFPTM, CEA, CA199, CHROMGRNA in the last 8760 hours.  Assessment and Plan:  74 yo with multiple medical problems including peripheral vascular disease and acute thrombosis of the distal left fem-PT bypass.  Agree with Vascular Surgery that additional imaging will be helpful and suspect that the best initial option for this patient is catheter-directed thrombolysis of this clot.  Discussed arteriogram and thrombolysis with patient and family.  They understand that he will need to be in ICU if we perform thrombolytic therapy.  They appear to have a good understanding of the procedure and risks of bleeding.  Review of patient's prior imaging demonstrates heavily diseased arteries in pelvis.  Due to the distal location of the bypass thrombosis and  superficial nature of the bypass, will likely perform an antegrade  puncture in left groin or leg.  Will re-check INR and other labs in morning.  If no contraindications with INR, anticipate arteriogram and potential thrombolysis tomorrow.  Thank you for this interesting consult.  I greatly enjoyed meeting [name] and look forward to participating in their care.  A copy of this report was sent to the requesting provider on this date.  Electronically Signed: Abundio Miu 06/12/2016, 6:22 PM   I spent a total of 40 Minutes  in face to face in clinical consultation, greater than 50% of which was counseling/coordinating care for left leg bypass thrombosis.

## 2016-06-12 NOTE — ED Triage Notes (Signed)
Patient here with left cool foot and pain to same since last pm, has had surgery to same for vascular problems to this leg, unable to palpate pulse on arrival, dark coloration to same

## 2016-06-12 NOTE — ED Notes (Signed)
Pt. Given clear liquids gingerale and jello

## 2016-06-12 NOTE — ED Notes (Signed)
Vascular at bedside

## 2016-06-12 NOTE — H&P (Signed)
[facility] NOTE   MRN : [id number]  Reason for Consult: Left foot cool to touch possible fem-PT bypass occlusion Referring Physician: ED  History of Present Illness: 75 y/o male with cold left foot and calf cramping since yesterday at 3pm.  He was told to come to the ED this am by Dr. Darrick Penna.    He has also undergone a femoral to posterior tibial bypass graft with arm vein in 2007 by Dr. Hart Rochester.  He has had a history of right brain stroke.  He suffers from atrial fibrillation and is on Coumadin.  He is a diabetic.  He has recently changed medications and states that his blood sugars are under much better control, around 100.  He is medically managed for hypercholesterolemia with a statin.  He is on multiple blood pressure medications.  He takes Pletal for peripheral vascular disease.  He is a smoker.  He reports a significant weight loss.  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 Myra Gianotti reported by Dr. Tyrone Sage.    Of note pt has had recent CT which showed multiple chest nodules, possible mass, dilated bile ducts, wt loss and work up was in progress for this.     Current Facility-Administered Medications  Medication Dose Route Frequency Provider Last Rate Last Dose  . heparin ADULT infusion 100 units/mL (25000 units/287mL sodium chloride 0.45%)  900 Units/hr Intravenous Continuous Apryl Irine Seal, RPH      . heparin bolus via infusion 3,000 Units  3,000 Units Intravenous Once Apryl Irine Seal, [facility]       Current Outpatient Prescriptions  Medication Sig Dispense Refill  . ALPRAZolam (XANAX) 0.5 MG tablet Take 0.25 mg by mouth at bedtime as needed for anxiety.     Marland Kitchen amLODipine (NORVASC) 5 MG tablet Take 5 mg by mouth daily.     Marland Kitchen aspirin 81 MG tablet Take 81 mg by mouth daily.    Marland Kitchen atorvastatin (LIPITOR) 80 MG tablet Take 40 mg  by mouth.     . cilostazol (PLETAL) 100 MG tablet Take 100 mg by mouth 2 (two) times daily.    . cloNIDine (CATAPRES) 0.1 MG tablet Take 0.1 mg by mouth 3 (three) times daily.    Marland Kitchen docusate sodium (COLACE) 100 MG capsule Take 200 mg by mouth 2 (two) times daily.     . Ferrous Fumarate-Folic Acid (FERROCITE F PO) Take by mouth daily.    Marland Kitchen glipiZIDE (GLUCOTROL) 5 MG tablet Take 5 mg by mouth 2 (two) times daily before a meal.    . levofloxacin (LEVAQUIN) 500 MG tablet Take 1 tablet (500 mg total) by mouth daily. 14 tablet 0  . losartan (COZAAR) 100 MG tablet Take 100 mg by mouth daily.    . metoprolol tartrate (LOPRESSOR) 25 MG tablet TAKE 1 TABLET BY MOUTH TWICE DAILY 180 tablet 0  . omega-3 acid ethyl esters (LOVAZA) 1 G capsule Take by mouth 2 (two) times daily. Takes 2 tablets    . oxyCODONE (ROXICODONE) 15 MG immediate release tablet Take 15 mg by mouth every 8 (eight) hours as needed for pain.    . pantoprazole (PROTONIX) 40 MG tablet Take 40 mg by mouth 2 (two) times daily.     . propafenone (RYTHMOL) 225 MG tablet Take 225 mg by mouth 2 (two) times daily.    Marland Kitchen  saxagliptin HCl (ONGLYZA) 2.5 MG TABS tablet Take 2.5 mg by mouth daily.    Marland Kitchen testosterone cypionate (DEPOTESTOTERONE CYPIONATE) 200 MG/ML injection Inject 100 mg into the skin every 14 (fourteen) days.     . Vitamin D, Ergocalciferol, (DRISDOL) 50000 UNITS CAPS capsule Take 50,000 Units by mouth every 7 (seven) days.    . Warfarin Sodium (COUMADIN PO) Take 4 mg by mouth daily. As directed    . cloNIDine (CATAPRES) 0.3 MG tablet Take 0.1 mg by mouth 3 (three) times daily.   6  . oxycodone (ROXICODONE) 30 MG immediate release tablet Take 15 mg by mouth every 8 (eight) hours as needed.     . propafenone (RYTHMOL) 225 MG tablet TAKE 1 TABLET BY MOUTH EVERY 8 HOURS 90 tablet 1    Pt meds include: Statin :Yes Betablocker: Yes ASA: Yes Other anticoagulants/antiplatelets: coumadin  Past Medical History:  Diagnosis Date  . AAA  (abdominal aortic aneurysm) (HCC)   . Anemia   . Anxiety   . Arthritis    Gout  . Atrial fibrillation (HCC)   . Barrett esophagus   . Carotid artery occlusion    Carotid endarterectomy 2002 and 2004  . COPD (chronic obstructive pulmonary disease) (HCC)   . Diabetes mellitus   . Diverticulosis   . DVT (deep venous thrombosis) (HCC)   . Erectile dysfunction   . GERD (gastroesophageal reflux disease)   . Gout   . Hiatal hernia   . History of stress test    March 2012 post stress ejection fraction 56%, negative for ischemia.  Last 2-D echocardiogram March 2012 younger than 55% mild concentric left ventricular hypertrophy grade 1 diastolic dysfunction. Mildly dilated left atrium. 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  . Hyperlipidemia   . Hypertension   . Lumbar stenosis   . PAD (peripheral artery disease) (HCC)    Fem-Fem by-pass graft 1991. redo patch angioplasty in 2000 and 2004.  LEA dopplers: 04/2012- R-ABI 1.34, L-ABI 0.97, patent Left lower extremity graft.  . Pancreatitis   . Stroke [facility]) March 2016   Lihgt Stroke    Past Surgical History:  Procedure Laterality Date  . CAROTID ENDARTERECTOMY  2002   Left CEA  . CAROTID ENDARTERECTOMY  2007   Left CEA redo  . CATARACT EXTRACTION Left 02-02-95  . CATARACT EXTRACTION Right 04-20-95  . LUMBAR DISC SURGERY  1980  . PR VEIN BYPASS GRAFT,AORTO-FEM-POP  2007   Left Fem-post. tibial  . PR VEIN BYPASS GRAFT,AORTO-FEM-POP  1999-2004   numerous revision Left Fem-pop    Social History Social History  Substance Use Topics  . Smoking status: Light Tobacco Smoker    Packs/day: 0.33    Years: 60.00    Types: Cigarettes, E-cigarettes  . Smokeless tobacco: Former Neurosurgeon    Types: Chew    Quit date: 05/21/1968  . Alcohol use No    Family History Family History  Problem Relation Age of Onset  . Heart disease Father   . Heart attack Father     X's 3  . Hyperlipidemia Father   . Hypertension Father   . Diabetes Brother   . Hypertension  Brother   . Cancer Sister     type unknown    Allergies  Allergen Reactions  . Lopressor [Metoprolol Tartrate] Palpitations    LOPRESSOR bothers the patient.  . Niacin     Other reaction(s): Facial Edema (intolerance)     REVIEW OF SYSTEMS  General: [x ] Weight loss,  Fever,   chills Neurologic: [ ]  Dizziness, [ ]  Blackouts, [ ]  Seizure [ ]  Stroke, [ ]  "Mini stroke", [ ]  Slurred speech, [ ]  Temporary blindness; [ ]  weakness in arms or legs, [ ]  Hoarseness [ ]  Dysphagia Cardiac: [ ]  Chest pain/pressure, [ ]  Shortness of breath at rest [ ]  Shortness of breath with exertion, [ ]  Atrial fibrillation or irregular heartbeat  Vascular: [ x] Pain in legs with walking, [ ]  Pain in legs at rest, [ ]  Pain in legs at night,  [ ]  Non-healing ulcer, [ ]  Blood clot in vein/DVT,   Pulmonary: [ ]  Home oxygen, [ ]  Productive cough, [ ]  Coughing up blood, [ ]  Asthma,  [ ]  Wheezing [x ] COPD Musculoskeletal:  [x ] Arthritis, [ ]  Low back pain, [ ]  Joint pain Hematologic: [x ] Easy Bruising, [ ]  Anemia; [ ]  Hepatitis Gastrointestinal: [ ]  Blood in stool, [ ]  Gastroesophageal Reflux/heartburn, Urinary: [ ]  chronic Kidney disease, [ ]  on HD - [ ]  MWF or [ ]  TTHS, [ ]  Burning with urination, [ ]  Difficulty urinating Skin: [ ]  Rashes, [ ]  Wounds Psychological: [ ]  Anxiety, [ ]  Depression  Physical Examination Vitals:   06/12/16 1200 06/12/16 1215 06/12/16 1230 06/12/16 1245  BP: 128/61 142/60 146/68 148/64  Pulse: 64 64 62 62  Resp: 16 15 13 14   Temp:      TempSrc:      SpO2: 100% 100% 100% 100%  Weight:      Height:       Body mass index is 21.95 kg/m.  General:  WDWN in NAD HENT: WNL Eyes: Pupils equal Pulmonary: normal non-labored breathing , without Rales, rhonchi,  wheezing Cardiac: RRR, without  Murmurs, rubs or gallops; No carotid bruits Abdomen: soft, NT, no masses Skin: no rashes, ulcers noted;  no Gangrene , no cellulitis; no open wounds;   Vascular Exam/Pulses:Palpable  femroal, palpable graft pulse down to mid calf left LE.  Doppler signal PT at level of fem-PT anastomosis.  Right DP/PT doppler signals.  Palpable radial pulses equal B.   Musculoskeletal: no muscle wasting or atrophy; no edema  Neurologic: A&O X 3; Appropriate Affect ;  SENSATION: normal; MOTOR FUNCTION: 5/5 Symmetric Speech is fluent/normal   Significant Diagnostic Studies: CBC Lab Results  Component Value Date   WBC 9.9 06/12/2016   HGB 11.7 (L) 06/12/2016   HCT 34.9 (L) 06/12/2016   MCV 89.9 06/12/2016   PLT 144 (L) 06/12/2016    BMET    Component Value Date/Time   NA 135 06/12/2016 1055   K 4.5 06/12/2016 1055   CL 105 06/12/2016 1055   CO2 24 06/12/2016 1055   GLUCOSE 161 (H) 06/12/2016 1055   BUN 28 (H) 06/12/2016 1055   CREATININE 1.46 (H) 06/12/2016 1055   CALCIUM 9.4 06/12/2016 1055   GFRNONAA 45 (L) 06/12/2016 1055   GFRAA 52 (L) 06/12/2016 1055   Estimated Creatinine Clearance: 38.2 mL/min (by C-G formula based on SCr of 1.46 mg/dL).  COAG Lab Results  Component Value Date   INR 1.64 06/12/2016   INR 1.3 06/30/2007   INR 1.6 (H) 06/29/2007     Non-Invasive Vascular Imaging: none  ASSESSMENT/PLAN:  Ischemic left fem-PT bypass Coumadin 1.64 today he is subtherapeutic. We started heparin and will discuss thrombolysis of the fem-PT by pass with IR today.     Clinton Gallant [facility] 06/12/2016 1:36 PM  History and exam details as above.  He has a palpable graft pulse  to the level of the left mid leg.  He does have a monophasic PT doppler signal.  Graft has diffuse aneurysmal degeneration.    No further conduit available except PTFE which would be limited durability.  I believe best option is angio with thrombolysis to determine etiology of graft dysfunction.  Have discussed with Dr Katy Fitch from interventional radiology who will see later today.  Heparin IV for now.  Hold coumadin.  NPO p midnight.  Pt and family informed he has limited options if  this graft fails.  Fabienne Bruns, MD [facility] [place] Office: [phone] Pager: [phone]

## 2016-06-12 NOTE — ED Provider Notes (Signed)
[facility]-EMERGENCY DEPT Provider Note   CSN: [id number] Arrival date & time: 06/12/16  8469  First Provider Contact:  First MD Initiated Contact with Patient 06/12/16 1021        History   Chief Complaint No chief complaint on file.   HPI KORIE BRABSON is a 75 y.o. male.  The history is provided by a significant other and the patient.    75 year old male with history of AAA anemia, arthritis, A. Fib, COPD, DVT on Coumadin, gout, hyperlipidemia, diabetes, history of stroke, presenting to the ED with left foot pain and discoloration. Patient states he was having some cramping sensation in his left calf yesterday but attributed this to a muscle cramp so did not pay much attention to this. Reports upon waking this morning he had increased pain in his left foot and noticed that it appeared discolored. He describes this is a sharp, intense pain.  Patient admits to vascular surgeries on left leg in the past with Dr. Hart Rochester.  Saw Dr. Myra Gianotti on 06/02/16 in clinic and was found to have Aneurysmal changes to left femoral posterior tibial bypass graft.  Plan was to address this in the near future.  Patient is on chronic coumadin, has been compliant with this.  Patient also has hx of AAA, no chest or abdominal pain currently.  Has been able to eat/drink this morning.  Past Medical History:  Diagnosis Date  . AAA (abdominal aortic aneurysm) (HCC)   . Anemia   . Anxiety   . Arthritis    Gout  . Atrial fibrillation (HCC)   . Barrett esophagus   . Carotid artery occlusion    Carotid endarterectomy 2002 and 2004  . COPD (chronic obstructive pulmonary disease) (HCC)   . Diabetes mellitus   . Diverticulosis   . DVT (deep venous thrombosis) (HCC)   . Erectile dysfunction   . GERD (gastroesophageal reflux disease)   . Gout   . Hiatal hernia   . History of stress test    March 2012 post stress ejection fraction 56%, negative for ischemia.  Last 2-D echocardiogram March 2012 younger than 55% mild  concentric left ventricular hypertrophy grade 1 diastolic dysfunction. Mildly dilated left atrium. 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  . Hyperlipidemia   . Hypertension   . Lumbar stenosis   . PAD (peripheral artery disease) (HCC)    Fem-Fem by-pass graft 1991. redo patch angioplasty in 2000 and 2004.  LEA dopplers: 04/2012- R-ABI 1.34, L-ABI 0.97, patent Left lower extremity graft.  . Pancreatitis   . Stroke [facility]) March 2016   Lihgt Stroke    Patient Active Problem List   Diagnosis Date Noted  . CVA (cerebral vascular accident) February 2016 01/03/2015  . Essential hypertension 01/03/2015  . Weight loss 08/08/2014  . Aftercare following surgery of the circulatory system, NEC 03/19/2014  . DM2 (diabetes mellitus, type 2) (HCC) 12/11/2013  . COPD (chronic obstructive pulmonary disease) (HCC) 12/11/2013  . PVD (peripheral vascular disease) (HCC) 12/11/2013  . Barrett's esophagus 12/11/2013  . Occlusion and stenosis of carotid artery without mention of cerebral infarction 05/21/2013  . Anticoagulated on Coumadin 05/03/2013  . Other and unspecified hyperlipidemia 05/03/2013  . Paroxysmal atrial fibrillation (HCC) 05/03/2013  . Benign hypertensive heart disease without heart failure 05/03/2013  . 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 CAROTID ENDARTERECTOMY  2002   Left CEA  . CAROTID ENDARTERECTOMY  2007  Left CEA redo  . CATARACT EXTRACTION Left 02-02-95  . CATARACT EXTRACTION Right 04-20-95  . LUMBAR DISC SURGERY  1980  . PR VEIN BYPASS GRAFT,AORTO-FEM-POP  2007   Left Fem-post. tibial  . PR VEIN BYPASS GRAFT,AORTO-FEM-POP  1999-2004   numerous revision Left Fem-pop       Home Medications    Prior to Admission medications   Medication Sig Start Date End Date Taking? Authorizing Provider  ALPRAZolam Prudy Feeler) 0.5 MG tablet Take 0.5 mg by mouth at bedtime as needed.    Historical  Provider, MD  amLODipine (NORVASC) 5 MG tablet Take 5 mg by mouth daily.     Historical Provider, MD  aspirin 81 MG tablet Take 81 mg by mouth daily.    Historical Provider, MD  atorvastatin (LIPITOR) 80 MG tablet Take 40 mg by mouth.     Historical Provider, MD  cilostazol (PLETAL) 100 MG tablet Take 100 mg by mouth 2 (two) times daily.    Historical Provider, MD  cloNIDine (CATAPRES) 0.3 MG tablet Take 0.1 mg by mouth 3 (three) times daily.  02/21/15   Historical Provider, MD  docusate sodium (COLACE) 100 MG capsule Take 100 mg by mouth 2 (two) times daily.    Historical Provider, MD  Ferrous Fumarate-Folic Acid (FERROCITE F PO) Take by mouth daily.    Historical Provider, MD  glipiZIDE (GLUCOTROL) 5 MG tablet Take 5 mg by mouth 2 (two) times daily before a meal.    Historical Provider, MD  indomethacin (INDOCIN) 25 MG capsule Take 25 mg by mouth as needed.    Historical Provider, MD  levofloxacin (LEVAQUIN) 500 MG tablet Take 1 tablet (500 mg total) by mouth daily. 06/02/16   Praveen Mannam, MD  lidocaine (LIDODERM) 5 % Place 2 patches onto the skin daily. Remove & Discard patch within 12 hours or as directed by MD    Historical Provider, MD  losartan (COZAAR) 100 MG tablet Take 100 mg by mouth daily.    Historical Provider, MD  metoprolol tartrate (LOPRESSOR) 25 MG tablet TAKE 1 TABLET BY MOUTH TWICE DAILY 12/12/15   Lennette Bihari, MD  omega-3 acid ethyl esters (LOVAZA) 1 G capsule Take by mouth 2 (two) times daily. Takes 2 tablets    Historical Provider, MD  oxycodone (ROXICODONE) 30 MG immediate release tablet Take 15 mg by mouth every 8 (eight) hours as needed.     Historical Provider, MD  pantoprazole (PROTONIX) 40 MG tablet Take 40 mg by mouth 2 (two) times daily.     Historical Provider, MD  propafenone (RYTHMOL) 225 MG tablet TAKE 1 TABLET BY MOUTH EVERY 8 HOURS 01/19/16   Lennette Bihari, MD  quiNINE (QUALAQUIN) 324 MG capsule Take 648 mg by mouth as needed.    Historical Provider, MD    saxagliptin HCl (ONGLYZA) 2.5 MG TABS tablet Take 2.5 mg by mouth daily.    Historical Provider, MD  testosterone cypionate (DEPOTESTOTERONE CYPIONATE) 200 MG/ML injection Inject 100 mg into the skin every 14 (fourteen) days.  03/28/12   Historical Provider, MD  Vitamin D, Ergocalciferol, (DRISDOL) 50000 UNITS CAPS capsule Take 50,000 Units by mouth every 7 (seven) days.    Historical Provider, MD  Warfarin Sodium (COUMADIN PO) Take 4 mg by mouth daily. As directed    Historical Provider, MD    Family History Family History  Problem Relation Age of Onset  . Heart disease Father   . Heart attack Father     X's 3  .  Hyperlipidemia Father   . Hypertension Father   . Diabetes Brother   . Hypertension Brother   . Cancer Sister     type unknown    Social History Social History  Substance Use Topics  . Smoking status: Light Tobacco Smoker    Packs/day: 0.33    Years: 60.00    Types: Cigarettes, E-cigarettes  . Smokeless tobacco: Former Neurosurgeon    Types: Chew    Quit date: 05/21/1968  . Alcohol use No     Allergies   Lopressor [metoprolol tartrate] and Niacin   Review of Systems Review of Systems  Musculoskeletal: Positive for arthralgias and myalgias.  Skin: Positive for color change and pallor.  All other systems reviewed and are negative.    Physical Exam Updated Vital Signs BP 142/68 (BP Location: Left Arm)   Pulse 75   Temp 97.9 F (36.6 C) (Oral)   Resp 20   SpO2 99%   Physical Exam  Constitutional: He is oriented to person, place, and time. He appears well-developed and well-nourished.  Elderly, calm, pleasant  HENT:  Head: Normocephalic and atraumatic.  Mouth/Throat: Oropharynx is clear and moist.  Eyes: Conjunctivae and EOM are normal. Pupils are equal, round, and reactive to light.  Neck: Normal range of motion.  Cardiovascular: Normal rate, regular rhythm and normal heart sounds.   Pulmonary/Chest: Effort normal and breath sounds normal.  Abdominal:  Soft. Bowel sounds are normal.  Musculoskeletal: Normal range of motion.  Left foot is dusty in color and cold when compared with right; he does have significant varicosities of the left lower leg; normal motor function to both feet; delayed cap refill on left, normal on right  Neurological: He is alert and oriented to person, place, and time.  Skin: Skin is warm and dry.  Psychiatric: He has a normal mood and affect.  Nursing note and vitals reviewed.    ED Treatments / Results  Labs (all labs ordered are listed, but only abnormal results are displayed) Labs Reviewed  CBC WITH DIFFERENTIAL/PLATELET - Abnormal; Notable for the following:       Result Value   RBC 3.88 (*)    Hemoglobin 11.7 (*)    HCT 34.9 (*)    RDW 16.0 (*)    Platelets 144 (*)    All other components within normal limits  COMPREHENSIVE METABOLIC PANEL - Abnormal; Notable for the following:    Glucose, Bld 161 (*)    BUN 28 (*)    Creatinine, Ser 1.46 (*)    Albumin 3.1 (*)    GFR calc non Af Amer 45 (*)    GFR calc Af Amer 52 (*)    All other components within normal limits  PROTIME-INR - Abnormal; Notable for the following:    Prothrombin Time 19.6 (*)    All other components within normal limits  HEPARIN LEVEL (UNFRACTIONATED)    EKG  EKG Interpretation None       Radiology No results found.  Procedures Procedures (including critical care time)  CRITICAL CARE Performed by: Garlon Hatchet   Total critical care time: 45 minutes  Critical care time was exclusive of separately billable procedures and treating other patients.  Critical care was necessary to treat or prevent imminent or life-threatening deterioration.  Critical care was time spent personally by me on the following activities: development of treatment plan with patient and/or surrogate as well as nursing, discussions with consultants, evaluation of patient's response to treatment, examination of patient, obtaining history  from  patient or surrogate, ordering and performing treatments and interventions, ordering and review of laboratory studies, ordering and review of radiographic studies, pulse oximetry and re-evaluation of patient's condition.   Medications Ordered in ED Medications  heparin ADULT infusion 100 units/mL (25000 units/262mL sodium chloride 0.45%) (900 Units/hr Intravenous New Bag/Given 06/12/16 1336)  docusate sodium (COLACE) capsule 200 mg (not administered)  cilostazol (PLETAL) tablet 100 mg (not administered)  glipiZIDE (GLUCOTROL) tablet 5 mg (not administered)  amLODipine (NORVASC) tablet 5 mg (not administered)  pantoprazole (PROTONIX) EC tablet 40 mg (not administered)  ALPRAZolam (XANAX) tablet 0.25 mg (not administered)  aspirin tablet 81 mg (not administered)  atorvastatin (LIPITOR) tablet 40 mg (not administered)  Vitamin D (Ergocalciferol) (DRISDOL) capsule 50,000 Units (not administered)  losartan (COZAAR) tablet 100 mg (not administered)  metoprolol tartrate (LOPRESSOR) tablet 25 mg (not administered)  levofloxacin (LEVAQUIN) tablet 500 mg (not administered)  propafenone (RYTHMOL) tablet 225 mg (not administered)  cloNIDine (CATAPRES) tablet 0.1 mg (not administered)  oxyCODONE (Oxy IR/ROXICODONE) immediate release tablet 15 mg (not administered)  linagliptin (TRADJENTA) tablet 5 mg (not administered)  morphine 4 MG/ML injection 4 mg (4 mg Intravenous Given 06/12/16 1108)  heparin bolus via infusion 3,000 Units (3,000 Units Intravenous Bolus from Bag 06/12/16 1338)     Initial Impression / Assessment and Plan / ED Course  I have reviewed the triage vital signs and the nursing notes.  Pertinent labs & imaging results that were available during my care of the patient were reviewed by me and considered in my medical decision making (see chart for details).  Clinical Course   75 y.o. M here with cold, dusky, and pulseless left foot.  Pain began sometime yesterday.  Hx of PVD in left  leg, sees Dr. Myra Gianotti.  Pulses not able to be found with doppler.  Right foot with palpable DP pulse.  Normal motor function of left foot currently.  Will plan for labs including CBC, CMP, PT INR.  Emergent consult to vascular surgery.    10:44 AM Spoke with Maralyn Sago (OR RN) in case with Dr. Darrick Penna-- recommends labs for now.  Once INR back, get pharmacy to assess for heparin drip.  He will be down as soon as possible to assess.  Patient INR is subtherapeutic. Pharmacy was consulted, started heparin drip. Awaiting vascular evaluation.  Discussed with family as they had some concerns, all questions answered.  Patient maintains normal motor function of affected foot.  Vascular has evaluated patient-- will admit for further management.  Family updated on plan of care.  Final Clinical Impressions(s) / ED Diagnoses   Final diagnoses:  Lower limb ischemia    New Prescriptions New Prescriptions   No medications on file     Garlon Hatchet, Cordelia Poche 06/12/16 1409    Glynn Octave, MD 06/12/16 [phone]

## 2016-06-12 NOTE — Progress Notes (Signed)
ANTICOAGULATION CONSULT NOTE  Pharmacy Consult for Heparin Indication: atrial fibrillation and ischemic foot  Allergies  Allergen Reactions  . Lopressor [Metoprolol Tartrate] Palpitations    LOPRESSOR bothers the patient.  . Niacin     Other reaction(s): Facial Edema (intolerance)    Patient Measurements: Height: 5\' 9"  (175.3 cm) Weight: 129 lb 6.6 oz (58.7 kg) IBW/kg (Calculated) : 70.7 Heparin Dosing Weight: 61.7 kg  Vital Signs: Temp: 98.7 F (37.1 C) (07/29 1855) Temp Source: Oral (07/29 1855) BP: 193/78 (07/29 1855) Pulse Rate: 83 (07/29 1855)  Labs:  Recent Labs  06/12/16 1055 06/12/16 1950  HGB 11.7*  --   HCT 34.9*  --   PLT 144*  --   LABPROT 19.6*  --   INR 1.64  --   HEPARINUNFRC  --  0.25*  CREATININE 1.46*  --     Estimated Creatinine Clearance: 36.3 mL/min (by C-G formula based on SCr of 1.46 mg/dL).   Medical History: Past Medical History:  Diagnosis Date  . AAA (abdominal aortic aneurysm) (HCC)   . Anemia   . Anxiety   . Arthritis    Gout  . Atrial fibrillation (HCC)   . Barrett esophagus   . Carotid artery occlusion    Carotid endarterectomy 2002 and 2004  . COPD (chronic obstructive pulmonary disease) (HCC)   . Diabetes mellitus   . Diverticulosis   . DVT (deep venous thrombosis) (HCC)   . Erectile dysfunction   . GERD (gastroesophageal reflux disease)   . Gout   . Hiatal hernia   . History of stress test    March 2012 post stress ejection fraction 56%, negative for ischemia.  Last 2-D echocardiogram March 2012 younger than 55% mild concentric left ventricular hypertrophy grade 1 diastolic dysfunction. Mildly dilated left atrium. 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  . Hyperlipidemia   . Hypertension   . Lumbar stenosis   . PAD (peripheral artery disease) (HCC)    Fem-Fem by-pass graft 1991. redo patch angioplasty in 2000 and 2004.  LEA dopplers: 04/2012- R-ABI 1.34, L-ABI 0.97, patent Left lower extremity graft.  . Pancreatitis   .  Stroke [facility]) March 2016   Lihgt Stroke   Assessment: [name] is 73 yoM who presents to the ED on 7/29 for [name] ischemic foot. Prior to presentation, [name] was on warfarin 4mg  daily for AFib thromboprophylaxis. His INR is low at 1.64 on admission. Hgb and platelets low, but stable.   Initial HL low at 0.25 on 900 units/hr. No bleeding issues noted. Noted plans for possible intervention by IR in am. Recheck INR/HL in am.  Goal of Therapy:  Heparin level 0.3-0.7 units/ml Monitor platelets by anticoagulation protocol: Yes   Plan:  Increase heparin infusion to 1050 units/hr Check heparin level and INR in am Continue to monitor H&H and platelets  Sheppard Coil PharmD., BCPS Clinical Pharmacist Pager [phone] 06/12/2016 8:28 PM

## 2016-06-12 NOTE — Progress Notes (Signed)
ANTICOAGULATION CONSULT NOTE - Initial Consult  Pharmacy Consult for Heparin Indication: atrial fibrillation and ischemic foot  Allergies  Allergen Reactions  . Lopressor [Metoprolol Tartrate] Palpitations    LOPRESSOR bothers the patient.  . Niacin     Other reaction(s): Facial Edema (intolerance)    Patient Measurements: Height: 5\' 6"  (167.6 cm) Weight: 136 lb (61.7 kg) IBW/kg (Calculated) : 63.8 Heparin Dosing Weight: 61.7 kg  Vital Signs: Temp: 98 F (36.7 C) (07/29 1108) Temp Source: Oral (07/29 1108) BP: 130/61 (07/29 1145) Pulse Rate: 63 (07/29 1145)  Labs:  Recent Labs  06/12/16 1055  HGB 11.7*  HCT 34.9*  PLT 144*  LABPROT 19.6*  INR 1.64  CREATININE 1.46*    Estimated Creatinine Clearance: 38.2 mL/min (by C-G formula based on SCr of 1.46 mg/dL).   Medical History: Past Medical History:  Diagnosis Date  . AAA (abdominal aortic aneurysm) (HCC)   . Anemia   . Anxiety   . Arthritis    Gout  . Atrial fibrillation (HCC)   . Barrett esophagus   . Carotid artery occlusion    Carotid endarterectomy 2002 and 2004  . COPD (chronic obstructive pulmonary disease) (HCC)   . Diabetes mellitus   . Diverticulosis   . DVT (deep venous thrombosis) (HCC)   . Erectile dysfunction   . GERD (gastroesophageal reflux disease)   . Gout   . Hiatal hernia   . History of stress test    March 2012 post stress ejection fraction 56%, negative for ischemia.  Last 2-D echocardiogram March 2012 younger than 55% mild concentric left ventricular hypertrophy grade 1 diastolic dysfunction. Mildly dilated left atrium. 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  . Hyperlipidemia   . Hypertension   . Lumbar stenosis   . PAD (peripheral artery disease) (HCC)    Fem-Fem by-pass graft 1991. redo patch angioplasty in 2000 and 2004.  LEA dopplers: 04/2012- R-ABI 1.34, L-ABI 0.97, patent Left lower extremity graft.  . Pancreatitis   . Stroke [facility]) March 2016   Lihgt Stroke   Assessment: AN is  51 yoM who presents to the ED on 7/29 for an ischemic foot. Prior to presentation, AN was on warfarin 4mg  daily for AFib thromboprophylaxis. His INR is 1.46. Hgb and platelets low, but stable. No bleeding noted. Patient may go to surgery today.   Goal of Therapy:  Heparin level 0.3-0.7 units/ml Monitor platelets by anticoagulation protocol: Yes   Plan:  Give 3000 units bolus x 1 Start heparin infusion at 900 units/hr Check heparin level post-bolus at 2000 Continue to monitor H&H and platelets  Allie Bossier, PharmD PGY1 Pharmacy Resident [phone] (Pager) 06/12/2016 12:34 PM

## 2016-06-12 NOTE — Progress Notes (Signed)
Pt arrived to 3 south at [employer].  Marisue Ivan RN

## 2016-06-13 ENCOUNTER — Encounter (HOSPITAL_COMMUNITY): Payer: Self-pay | Admitting: Diagnostic Radiology

## 2016-06-13 ENCOUNTER — Inpatient Hospital Stay (HOSPITAL_COMMUNITY): Payer: Medicare Other

## 2016-06-13 HISTORY — PX: IR GENERIC HISTORICAL: IMG1180011

## 2016-06-13 LAB — COMPREHENSIVE METABOLIC PANEL
ALBUMIN: 2.7 g/dL — AB (ref 3.5–5.0)
ALK PHOS: 57 U/L (ref 38–126)
ALT: 15 U/L — ABNORMAL LOW (ref 17–63)
ANION GAP: 8 (ref 5–15)
AST: 30 U/L (ref 15–41)
BUN: 20 mg/dL (ref 6–20)
CALCIUM: 8.8 mg/dL — AB (ref 8.9–10.3)
CO2: 22 mmol/L (ref 22–32)
Chloride: 104 mmol/L (ref 101–111)
Creatinine, Ser: 1.3 mg/dL — ABNORMAL HIGH (ref 0.61–1.24)
GFR calc Af Amer: >60 mL/min (ref >60)
GFR calc non Af Amer: 52 mL/min — ABNORMAL LOW (ref >60)
GLUCOSE: 146 mg/dL — AB (ref 65–99)
POTASSIUM: 3.3 mmol/L — AB (ref 3.5–5.1)
SODIUM: 134 mmol/L — AB (ref 135–145)
TOTAL PROTEIN: 6.5 g/dL (ref 6.5–8.1)
Total Bilirubin: 1.4 mg/dL — ABNORMAL HIGH (ref 0.3–1.2)

## 2016-06-13 LAB — HEPARIN LEVEL (UNFRACTIONATED): HEPARIN UNFRACTIONATED: 0.43 [IU]/mL (ref 0.30–0.70)

## 2016-06-13 LAB — CBC
HCT: 32.5 % — ABNORMAL LOW (ref 39.0–52.0)
HCT: 34 % — ABNORMAL LOW (ref 39.0–52.0)
HEMATOCRIT: 33 % — AB (ref 39.0–52.0)
HEMOGLOBIN: 11 g/dL — AB (ref 13.0–17.0)
Hemoglobin: 11.4 g/dL — ABNORMAL LOW (ref 13.0–17.0)
Hemoglobin: 11.6 g/dL — ABNORMAL LOW (ref 13.0–17.0)
MCH: 30.3 pg (ref 26.0–34.0)
MCH: 30 pg (ref 26.0–34.0)
MCH: 31 pg (ref 26.0–34.0)
MCHC: 33.8 g/dL (ref 30.0–36.0)
MCHC: 33.5 g/dL (ref 30.0–36.0)
MCHC: 35.2 g/dL (ref 30.0–36.0)
MCV: 89.5 fL (ref 78.0–100.0)
MCV: 89.5 fL (ref 78.0–100.0)
MCV: 88.2 fL (ref 78.0–100.0)
PLATELETS: 126 10*3/uL — AB (ref 150–400)
PLATELETS: 117 10*3/uL — AB (ref 150–400)
Platelets: 129 10*3/uL — ABNORMAL LOW (ref 150–400)
RBC: 3.63 MIL/uL — ABNORMAL LOW (ref 4.22–5.81)
RBC: 3.8 MIL/uL — AB (ref 4.22–5.81)
RBC: 3.74 MIL/uL — ABNORMAL LOW (ref 4.22–5.81)
RDW: 15.7 % — ABNORMAL HIGH (ref 11.5–15.5)
RDW: 15.5 % (ref 11.5–15.5)
RDW: 15.5 % (ref 11.5–15.5)
WBC: 8.9 10*3/uL (ref 4.0–10.5)
WBC: 9.7 10*3/uL (ref 4.0–10.5)
WBC: 9.7 10*3/uL (ref 4.0–10.5)

## 2016-06-13 LAB — GLUCOSE, CAPILLARY
GLUCOSE-CAPILLARY: 86 mg/dL (ref 65–99)
GLUCOSE-CAPILLARY: 103 mg/dL — AB (ref 65–99)
GLUCOSE-CAPILLARY: 142 mg/dL — AB (ref 65–99)

## 2016-06-13 LAB — PROTIME-INR
INR: 1.71
PROTHROMBIN TIME: 20.3 s — AB (ref 11.4–15.2)

## 2016-06-13 LAB — FIBRINOGEN: FIBRINOGEN: 377 mg/dL (ref 210–475)

## 2016-06-13 MED ORDER — IOPAMIDOL (ISOVUE-300) INJECTION 61%
INTRAVENOUS | Status: AC
  Administered 2016-06-13: 100 mL
  Filled 2016-06-13: qty 100

## 2016-06-13 MED ORDER — ONDANSETRON HCL 4 MG/2ML IJ SOLN: 4.0000 mg | Freq: Four times a day (QID) | INTRAMUSCULAR | Status: DC | PRN

## 2016-06-13 MED ORDER — LIDOCAINE HCL 1 % IJ SOLN
INTRAMUSCULAR | Status: AC
  Administered 2016-06-13: 4 mL
  Filled 2016-06-13: qty 20

## 2016-06-13 MED ORDER — HEPARIN (PORCINE) IN NACL 100-0.45 UNIT/ML-% IJ SOLN
950.0000 [IU]/h | INTRAMUSCULAR | Status: DC
  Administered 2016-06-13: 800 [IU]/h via INTRAVENOUS
  Filled 2016-06-13: qty 250

## 2016-06-13 MED ORDER — FENTANYL CITRATE (PF) 100 MCG/2ML IJ SOLN
INTRAMUSCULAR | Status: AC | PRN
  Administered 2016-06-13 (×2): 25 ug via INTRAVENOUS
  Administered 2016-06-13: 50 ug via INTRAVENOUS

## 2016-06-13 MED ORDER — MIDAZOLAM HCL 2 MG/2ML IJ SOLN
INTRAMUSCULAR | Status: AC
  Filled 2016-06-13: qty 2

## 2016-06-13 MED ORDER — MIDAZOLAM HCL 2 MG/2ML IJ SOLN
INTRAMUSCULAR | Status: AC | PRN
  Administered 2016-06-13 (×2): 0.5 mg via INTRAVENOUS
  Administered 2016-06-13: 1 mg via INTRAVENOUS

## 2016-06-13 MED ORDER — SODIUM CHLORIDE 0.9% FLUSH
3.0000 mL | Freq: Two times a day (BID) | INTRAVENOUS | Status: DC
  Administered 2016-06-13 – 2016-06-18 (×9): 3 mL via INTRAVENOUS

## 2016-06-13 MED ORDER — SODIUM CHLORIDE 0.9 % IV SOLN: 250.0000 mL | INTRAVENOUS | Status: DC | PRN

## 2016-06-13 MED ORDER — METOPROLOL TARTRATE 5 MG/5ML IV SOLN
INTRAVENOUS | Status: AC
  Filled 2016-06-13: qty 5

## 2016-06-13 MED ORDER — MORPHINE SULFATE (PF) 4 MG/ML IV SOLN
5.0000 mg | INTRAVENOUS | Status: DC | PRN
  Administered 2016-06-13 – 2016-06-18 (×9): 5 mg via INTRAVENOUS
  Filled 2016-06-13 (×9): qty 2

## 2016-06-13 MED ORDER — MIDAZOLAM HCL 2 MG/2ML IJ SOLN: 1.0000 mg | INTRAMUSCULAR | Status: DC | PRN

## 2016-06-13 MED ORDER — SODIUM CHLORIDE 0.9 % IV SOLN
0.5000 mg/h | INTRAVENOUS | Status: DC
  Administered 2016-06-13 – 2016-06-14 (×2): 0.5 mg/h via INTRA_ARTERIAL
  Filled 2016-06-13 (×3): qty 10

## 2016-06-13 MED ORDER — SODIUM CHLORIDE 0.9% FLUSH: 3.0000 mL | INTRAVENOUS | Status: DC | PRN

## 2016-06-13 MED ORDER — FENTANYL CITRATE (PF) 100 MCG/2ML IJ SOLN
INTRAMUSCULAR | Status: AC
  Filled 2016-06-13: qty 2

## 2016-06-13 NOTE — Sedation Documentation (Signed)
Patient denies pain and is resting comfortably.  

## 2016-06-13 NOTE — Progress Notes (Signed)
Patient arrived from IR with RN. Report received from IR RN and 3S RN. Patient is alert and orientedX4. On monitor. Denies pain. Infusing medications per order and policy. Will continue to monitor.

## 2016-06-13 NOTE — Progress Notes (Signed)
ANTICOAGULATION CONSULT NOTE  Pharmacy Consult for Heparin Indication: atrial fibrillation and ischemic foot  Patient Measurements: Height: 5\' 9"  (175.3 cm) Weight: 129 lb 6.6 oz (58.7 kg) IBW/kg (Calculated) : 70.7 Heparin Dosing Weight: 58.7 kg  Vital Signs: Temp: 98.5 F (36.9 C) (07/30 1218) Temp Source: Oral (07/30 1218) BP: 200/74 (07/30 1200) Pulse Rate: 80 (07/30 1200)  Labs:  Recent Labs  06/12/16 1055 06/12/16 1950 06/13/16 0411  HGB 11.7*  --  11.0*  HCT 34.9*  --  32.5*  PLT 144*  --  129*  LABPROT 19.6*  --  20.3*  INR 1.64  --  1.71  HEPARINUNFRC  --  0.25* 0.43  CREATININE 1.46*  --   --     Estimated Creatinine Clearance: 36.3 mL/min (by C-G formula based on SCr of 1.46 mg/dL).  Assessment: [name] is 24 yoM who presents to the ED on 7/29 for [name] ischemic foot. Prior to presentation, [name] was on warfarin 4mg  daily for AFib thromboprophylaxis. His INR was low at 1.64 on admission. INR today is 1.71. Warfarin is held for now per vascular surgery/IR intervention. Hgb low but stable. Platelets trending down moderately (144>129)- could be multifactorial from antibiotic/patient condition. Will monitor.   Initial HL low at 0.25 on 900 units/hr. No bleeding issues noted.  HL therapeutic at 0.43 on 1050 units/hr. No bleeding issues noted.   IR determined a occluded left vein graft, and started catheter directed thrombolysis with TPA. Heparin rate and goal has been decreased while patient is on TPA.   Goal of Therapy:  Heparin level 0.2-0.5 units/ml Monitor platelets by anticoagulation protocol: Yes   Plan:  - Continue heparin infusion at 800 units/hr - Check heparin level every 6 hours for 4 occurrences post-procedure, then daily or as necessary  - Continue to monitor H&H, platelets, and s/sx's of bleeding  Allie Bossier, PharmD PGY1 Pharmacy Resident [phone] (Pager) 06/13/2016 12:35 PM

## 2016-06-13 NOTE — Progress Notes (Signed)
Pt transported to IR by myself and transporter. Pt was on room air and telemetry and room air. Report and chart given to IR RN. Dr Lowella Dandy at bedside speaking with pt and his family. Marisue Ivan RN

## 2016-06-13 NOTE — Progress Notes (Signed)
Patient ID: [name], male   DOB: January 09, 1941, 75 y.o.   MRN: [id number] Started thrombolysis today. Patient already feels better. No hematoma at puncture site. There is now a pulse in mid and distal calf vein graft. Foot is warm. Continue TPA overnight and plan for repeat arteriogram tomorrow.

## 2016-06-13 NOTE — Progress Notes (Signed)
ANTICOAGULATION CONSULT NOTE  Pharmacy Consult for Heparin Indication: atrial fibrillation and ischemic foot  Allergies  Allergen Reactions  . Lopressor [Metoprolol Tartrate] Palpitations    LOPRESSOR bothers the patient.  . Niacin     Other reaction(s): Facial Edema (intolerance)    Patient Measurements: Height: 5\' 9"  (175.3 cm) Weight: 129 lb 6.6 oz (58.7 kg) IBW/kg (Calculated) : 70.7 Heparin Dosing Weight: 58.7 kg  Vital Signs: Temp: 98.2 F (36.8 C) (07/30 0748) Temp Source: Oral (07/30 0748) BP: 157/67 (07/30 0748) Pulse Rate: 68 (07/30 0258)  Labs:  Recent Labs  06/12/16 1055 06/12/16 1950 06/13/16 0411  HGB 11.7*  --  11.0*  HCT 34.9*  --  32.5*  PLT 144*  --  129*  LABPROT 19.6*  --  20.3*  INR 1.64  --  1.71  HEPARINUNFRC  --  0.25* 0.43  CREATININE 1.46*  --   --     Estimated Creatinine Clearance: 36.3 mL/min (by C-G formula based on SCr of 1.46 mg/dL).   Medical History: Past Medical History:  Diagnosis Date  . AAA (abdominal aortic aneurysm) (HCC)   . Anemia   . Anxiety   . Arthritis    Gout  . Atrial fibrillation (HCC)   . Barrett esophagus   . Carotid artery occlusion    Carotid endarterectomy 2002 and 2004  . COPD (chronic obstructive pulmonary disease) (HCC)   . Diabetes mellitus   . Diverticulosis   . DVT (deep venous thrombosis) (HCC)   . Erectile dysfunction   . GERD (gastroesophageal reflux disease)   . Gout   . Hiatal hernia   . History of stress test    March 2012 post stress ejection fraction 56%, negative for ischemia.  Last 2-D echocardiogram March 2012 younger than 55% mild concentric left ventricular hypertrophy grade 1 diastolic dysfunction. Mildly dilated left atrium. 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  . Hyperlipidemia   . Hypertension   . Lumbar stenosis   . PAD (peripheral artery disease) (HCC)    Fem-Fem by-pass graft 1991. redo patch angioplasty in 2000 and 2004.  LEA dopplers: 04/2012- R-ABI 1.34, L-ABI 0.97,  patent Left lower extremity graft.  . Pancreatitis   . Stroke [facility]) March 2016   Lihgt Stroke   Assessment: [name] is 37 yoM who presents to the ED on 7/29 for [name] ischemic foot. Prior to presentation, [name] was on warfarin 4mg  daily for AFib thromboprophylaxis. His INR was low at 1.64 on admission. Hgb low but stable. Platelets trending down moderately (144>129)- could be multifactorial from antibiotic/patient condition. Will monitor.   Initial HL low at 0.25 on 900 units/hr. No bleeding issues noted.  HL therapeutic at 0.43 on 1050 units/hr. No bleeding issues noted. Patient planned for arteriogram and possible thrombolysis today 7/30.  INR today is  1.71. Holding warfarin for now for vascular surgery/IR intervention.   Goal of Therapy:  Heparin level 0.3-0.7 units/ml Monitor platelets by anticoagulation protocol: Yes   Plan:  Continue heparin infusion at 1050 units/hr. Check heparin level and INR in am Continue to monitor H&H and platelets Follow-up plan from IR and vascular surgery.   Bailey Mech, Ilda Basset D PGY1 Pharmacy Resident Pager [phone] 06/13/2016 8:42 AM

## 2016-06-13 NOTE — Procedures (Signed)
Post-Procedure Note  Pre-operative Diagnosis: Left leg ischemia due to occluded left fem-PT vein bypass graft.       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   Indications:  Left leg ischemia with pain.  Procedure Details:   Informed consent obtained for LLE arteriogram and thrombolytic therapy.  US demonstrated patent vein graft in left thigh.  Left thigh vein graft with accessed with [place] and arteriogram performed.  6 Fr sheath placed and 5 Fr catheter was advanced through calf thrombus with difficultly.  Catheter and wire advanced to distal anastomosis.  50 cm length infusion catheter placed in left calf.  Starting TPA infusion at 0.5 mg/hr.  Transferred to ICU bed.  Findings: Left vein bypass graft occluded in mid and distal calf.  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  Catheter and wire advanced into clot and infusion catheter tip placed just above ankle.   Distal anastomosis of vein bypass appears to be occluded.   Complications: None     Condition: Stable  Plan: Start catheter directed thrombolysis of left leg vein graft with TPA.   Follow-up arteriogram on 06/14/16.

## 2016-06-13 NOTE — Progress Notes (Signed)
[facility] [place]  Subjective  - states foot feel a little better   Objective (!) 157/67 68 98.2 F (36.8 C) (Oral) 10 96%  Intake/Output Summary (Last 24 hours) at 06/13/16 0914 Last data filed at 06/13/16 0400  Gross per 24 hour  Intake          1203.73 ml  Output             2200 ml  Net          -996.27 ml   Exam basically unchanged loses graft pulse distal leg, has PT doppler signal  Assessment/Planning: For thrombolysis today, again emphasized to family and pt limited options  [name] 06/13/2016 9:14 AM --  Laboratory Lab Results:  Recent Labs  06/12/16 1055 06/13/16 0411  WBC 9.9 8.9  HGB 11.7* 11.0*  HCT 34.9* 32.5*  PLT 144* 129*   BMET  Recent Labs  06/12/16 1055  NA 135  K 4.5  CL 105  CO2 24  GLUCOSE 161*  BUN 28*  CREATININE 1.46*  CALCIUM 9.4    COAG Lab Results  Component Value Date   INR 1.71 06/13/2016   INR 1.64 06/12/2016   INR 1.3 06/30/2007   No results found for: PTT

## 2016-06-13 NOTE — Progress Notes (Signed)
ANTICOAGULATION CONSULT NOTE  Pharmacy Consult for Heparin Indication: atrial fibrillation and ischemic foot  Patient Measurements: Height: 5\' 9"  (175.3 cm) Weight: 129 lb 6.6 oz (58.7 kg) IBW/kg (Calculated) : 70.7 Heparin Dosing Weight: 58.7 kg  Vital Signs: Temp: 98.2 F (36.8 C) (07/30 1900) Temp Source: Oral (07/30 1900) BP: 135/64 (07/30 2000) Pulse Rate: 67 (07/30 2100)  Labs:  Recent Labs  06/12/16 1055 06/12/16 1950 06/13/16 0411 06/13/16 1618 06/13/16 1631 06/13/16 2006  HGB 11.7*  --  11.0* 11.4* 11.6*  --   HCT 34.9*  --  32.5* 34.0* 33.0*  --   PLT 144*  --  129* 126* 117*  --   LABPROT 19.6*  --  20.3*  --   --   --   INR 1.64  --  1.71  --   --   --   HEPARINUNFRC  --  0.25* 0.43  --   --  <0.10*  CREATININE 1.46*  --   --  1.30*  --   --     Estimated Creatinine Clearance: 40.8 mL/min (by C-G formula based on SCr of 1.3 mg/dL).  Assessment: AN is 19 yoM who presents to the ED on 7/29 for an ischemic foot. Prior to presentation, AN was on warfarin 4mg  daily for AFib thromboprophylaxis. His INR was low at 1.64 on admission. INR today is 1.71. Warfarin is held for now per vascular surgery/IR intervention. Hgb low but stable. Platelets trending down moderately (144>129)- could be multifactorial from antibiotic/patient condition. Will monitor.   Initial HL low at 0.25 on 900 units/hr. No bleeding issues noted.  HL therapeutic at 0.43 on 1050 units/hr. No bleeding issues noted.   IR determined a occluded left vein graft, and started catheter directed thrombolysis with TPA. Heparin rate and goal has been decreased while patient is on TPA.  HL < 0.10 on 800 units/hr while on thrombolysis.   Goal of Therapy:  Heparin level 0.2-0.5 units/ml Monitor platelets by anticoagulation protocol: Yes   Plan:  - Increase heparin infusion to 950 units/hr - Check heparin level every 6 hours for 4 occurrences post-procedure, then daily or as necessary  - Continue to  monitor H&H, platelets, and s/sx's of bleeding  Pollyann Samples, PharmD, BCPS 06/13/2016, 10:02 PM Pager: [phone]

## 2016-06-14 ENCOUNTER — Inpatient Hospital Stay (HOSPITAL_COMMUNITY): Payer: Medicare Other

## 2016-06-14 ENCOUNTER — Encounter (HOSPITAL_COMMUNITY): Payer: Self-pay | Admitting: Interventional Radiology

## 2016-06-14 HISTORY — PX: IR GENERIC HISTORICAL: IMG1180011

## 2016-06-14 LAB — GLUCOSE, CAPILLARY
GLUCOSE-CAPILLARY: 99 mg/dL (ref 65–99)
Glucose-Capillary: 101 mg/dL — ABNORMAL HIGH (ref 65–99)
Glucose-Capillary: 133 mg/dL — ABNORMAL HIGH (ref 65–99)
Glucose-Capillary: 95 mg/dL (ref 65–99)

## 2016-06-14 LAB — FIBRINOGEN
FIBRINOGEN: 358 mg/dL (ref 210–475)
Fibrinogen: 409 mg/dL (ref 210–475)

## 2016-06-14 LAB — CBC
HCT: 31.5 % — ABNORMAL LOW (ref 39.0–52.0)
HEMATOCRIT: 37.7 % — AB (ref 39.0–52.0)
Hemoglobin: 11.1 g/dL — ABNORMAL LOW (ref 13.0–17.0)
Hemoglobin: 12.6 g/dL — ABNORMAL LOW (ref 13.0–17.0)
MCH: 31 pg (ref 26.0–34.0)
MCH: 30.2 pg (ref 26.0–34.0)
MCHC: 33.4 g/dL (ref 30.0–36.0)
MCHC: 35.2 g/dL (ref 30.0–36.0)
MCV: 90.4 fL (ref 78.0–100.0)
MCV: 88 fL (ref 78.0–100.0)
PLATELETS: 134 10*3/uL — AB (ref 150–400)
PLATELETS: 112 10*3/uL — AB (ref 150–400)
RBC: 3.58 MIL/uL — ABNORMAL LOW (ref 4.22–5.81)
RBC: 4.17 MIL/uL — ABNORMAL LOW (ref 4.22–5.81)
RDW: 15.5 % (ref 11.5–15.5)
RDW: 15.5 % (ref 11.5–15.5)
WBC: 13.6 10*3/uL — ABNORMAL HIGH (ref 4.0–10.5)
WBC: 9.8 10*3/uL (ref 4.0–10.5)

## 2016-06-14 LAB — BASIC METABOLIC PANEL
ANION GAP: 6 (ref 5–15)
BUN: 19 mg/dL (ref 6–20)
CALCIUM: 8.5 mg/dL — AB (ref 8.9–10.3)
CO2: 23 mmol/L (ref 22–32)
CREATININE: 1.29 mg/dL — AB (ref 0.61–1.24)
Chloride: 106 mmol/L (ref 101–111)
GFR calc Af Amer: >60 mL/min (ref >60)
GFR, EST NON AFRICAN AMERICAN: 53 mL/min — AB (ref >60)
GLUCOSE: 99 mg/dL (ref 65–99)
Potassium: 3.3 mmol/L — ABNORMAL LOW (ref 3.5–5.1)
Sodium: 135 mmol/L (ref 135–145)

## 2016-06-14 LAB — HEPARIN LEVEL (UNFRACTIONATED)
HEPARIN UNFRACTIONATED: 0.29 [IU]/mL — AB (ref 0.30–0.70)
Heparin Unfractionated: 0.37 IU/mL (ref 0.30–0.70)

## 2016-06-14 LAB — PROTIME-INR
INR: 1.48
Prothrombin Time: 18.1 seconds — ABNORMAL HIGH (ref 11.4–15.2)

## 2016-06-14 LAB — POTASSIUM: POTASSIUM: 3.5 mmol/L (ref 3.5–5.1)

## 2016-06-14 MED ORDER — POTASSIUM CHLORIDE 10 MEQ/100ML IV SOLN
10.0000 meq | INTRAVENOUS | Status: AC | PRN
  Administered 2016-06-14 (×3): 10 meq via INTRAVENOUS
  Filled 2016-06-14 (×3): qty 100

## 2016-06-14 MED ORDER — IOPAMIDOL (ISOVUE-300) INJECTION 61%
INTRAVENOUS | Status: AC
  Administered 2016-06-14: 50 mL
  Filled 2016-06-14: qty 100

## 2016-06-14 MED ORDER — LIDOCAINE HCL 1 % IJ SOLN
INTRAMUSCULAR | Status: AC
  Filled 2016-06-14: qty 20

## 2016-06-14 MED ORDER — MIDAZOLAM HCL 2 MG/2ML IJ SOLN
INTRAMUSCULAR | Status: AC
  Filled 2016-06-14: qty 2

## 2016-06-14 MED ORDER — WARFARIN - PHARMACIST DOSING INPATIENT
Freq: Every day | Status: DC
  Administered 2016-06-15: 18:00:00

## 2016-06-14 MED ORDER — FENTANYL CITRATE (PF) 100 MCG/2ML IJ SOLN
INTRAMUSCULAR | Status: AC
  Filled 2016-06-14: qty 2

## 2016-06-14 MED ORDER — HEPARIN (PORCINE) IN NACL 100-0.45 UNIT/ML-% IJ SOLN
1000.0000 [IU]/h | INTRAMUSCULAR | Status: DC
  Administered 2016-06-14: 1000 [IU]/h via INTRAVENOUS
  Filled 2016-06-14: qty 250

## 2016-06-14 MED ORDER — WARFARIN SODIUM 3 MG PO TABS
4.0000 mg | ORAL_TABLET | Freq: Once | ORAL | Status: AC
  Administered 2016-06-14: 4 mg via ORAL
  Filled 2016-06-14: qty 0.5

## 2016-06-14 NOTE — Sedation Documentation (Signed)
No Intervention required. Left groin catheter removed by Dr. Deanne Coffer. Tolerated well.

## 2016-06-14 NOTE — Progress Notes (Signed)
06/14/2016 1045 Nursing note  Pharmacy contacted to confirm that although order for TPA ends at 1030 to continue gtt until taken to IR for arteriogram. Will await callback from pharmacist. Will continue to closely monitor patient.  Mischelle Reeg, Blanchard Kelch

## 2016-06-14 NOTE — Progress Notes (Signed)
06/14/2016 11:16 AM Nursing note Confirmed with Samantha Rhyne PAC total 3 runs KCL IV to be given today per protocol and recheck potassium later today. Orders enacted. Will continue to closely monitor patient.  Josejuan Hoaglin, Blanchard Kelch

## 2016-06-14 NOTE — Progress Notes (Signed)
ANTICOAGULATION CONSULT NOTE  Pharmacy Consult for Heparin Indication: atrial fibrillation and ischemic foot  Patient Measurements: Height: 5\' 9"  (175.3 cm) Weight: 129 lb 6.6 oz (58.7 kg) IBW/kg (Calculated) : 70.7 Heparin Dosing Weight: 58.7 kg  Vital Signs: Temp: 97.5 F (36.4 C) (07/31 0000) Temp Source: Oral (07/31 0000) BP: 137/63 (07/31 0400) Pulse Rate: 62 (07/31 0400)  Labs:  Recent Labs  06/12/16 1055  06/13/16 0411 06/13/16 1618 06/13/16 1631 06/13/16 2006 06/14/16 0011 06/14/16 0223  HGB 11.7*  --  11.0* 11.4* 11.6*  --  12.6*  --   HCT 34.9*  --  32.5* 34.0* 33.0*  --  37.7*  --   PLT 144*  --  129* 126* 117*  --  134*  --   LABPROT 19.6*  --  20.3*  --   --   --   --   --   INR 1.64  --  1.71  --   --   --   --   --   HEPARINUNFRC  --   < > 0.43  --   --  <0.10*  --  0.29*  CREATININE 1.46*  --   --  1.30*  --   --   --   --   < > = values in this interval not displayed.  Estimated Creatinine Clearance: 40.8 mL/min (by C-G formula based on SCr of 1.3 mg/dL).  Assessment: AN is 52 yoM who presents to the ED on 7/29 for an ischemic foot. Prior to presentation, AN was on warfarin 4mg  daily for AFib thromboprophylaxis. His INR was low at 1.64 on admission. INR today is 1.71. Warfarin is held for now per vascular surgery/IR intervention. Hgb low but stable. Platelets low but trending up.   IR determined a occluded left vein graft, and started catheter directed thrombolysis with TPA (should end 7/31 ~1015).  Heparin level therapeutic (0.29) on 950 units/hr while on thrombolysis.   Goal of Therapy:  Heparin level 0.2-0.5 units/ml Monitor platelets by anticoagulation protocol: Yes   Plan:  - Continue heparin infusion at 950 units/hr - Check heparin level every 6 hours for 4 occurrences post-procedure, then daily or as necessary   Christoper Fabian, PharmD, BCPS Clinical pharmacist, pager [phone] 06/14/2016, 4:12 AM

## 2016-06-14 NOTE — Care Management Important Message (Signed)
Important Message  Patient Details  Name: YOVANNY OUTLEY MRN: [id number] Date of Birth: [birth year]   Medicare Important Message Given:  Yes    Bernadette Hoit 06/14/2016, 3:15 PM

## 2016-06-14 NOTE — Procedures (Signed)
LLE arteriogram, follow-up: clearance of thrombus, no high grade stenosis TPA d/c'ed, sheath removed No complication No blood loss. See complete dictation in [facility].

## 2016-06-14 NOTE — Telephone Encounter (Signed)
Follow up   Office is calling back about surgical clearance  Request for surgical clearance:  1. What type of surgery is being performed? Animrism repaire  When is this surgery scheduled? unknown Are there any medications that need to be held prior to surgery and how long? Coumadin and Costazol 2. Name of physician performing surgery? Dr.Brabham  What is your office phone and fax number? [phone]  Fax [phone]

## 2016-06-14 NOTE — Progress Notes (Addendum)
  Progress Note    06/14/2016 7:19 AM * No surgery found *  Subjective:  C/o having trouble peeing while laying flat  Afebrile HR 60's-70's NSR 130's-170's systolic 98% [id number]  Gtts:  heparin  Vitals:   06/14/16 0400 06/14/16 0500  BP: 137/63 (!) 152/60  Pulse: 62 66  Resp: 13 11  Temp:      Physical Exam: Cardiac:  regular Lungs:  Non labored Extremities:  Motor/sensation of left foot is in tact; easily palpable graft pulse all the way to the ankle with 2+ palpable left DP pulse   CBC    Component Value Date/Time   WBC 13.6 (H) 06/14/2016 0011   RBC 4.17 (L) 06/14/2016 0011   HGB 12.6 (L) 06/14/2016 0011   HCT 37.7 (L) 06/14/2016 0011   PLT 134 (L) 06/14/2016 0011   MCV 90.4 06/14/2016 0011   MCH 30.2 06/14/2016 0011   MCHC 33.4 06/14/2016 0011   RDW 15.5 06/14/2016 0011   LYMPHSABS 2.1 06/12/2016 1055   MONOABS 0.9 06/12/2016 1055   EOSABS 0.3 06/12/2016 1055   BASOSABS 0.0 06/12/2016 1055    BMET    Component Value Date/Time   NA 134 (L) 06/13/2016 1618   K 3.3 (L) 06/13/2016 1618   CL 104 06/13/2016 1618   CO2 22 06/13/2016 1618   GLUCOSE 146 (H) 06/13/2016 1618   BUN 20 06/13/2016 1618   CREATININE 1.30 (H) 06/13/2016 1618   CALCIUM 8.8 (L) 06/13/2016 1618   GFRNONAA 52 (L) 06/13/2016 1618   GFRAA >60 06/13/2016 1618    INR    Component Value Date/Time   INR 1.71 06/13/2016 0411     Intake/Output Summary (Last 24 hours) at 06/14/16 0719 Last data filed at 06/14/16 0700  Gross per 24 hour  Intake          3831.83 ml  Output             2551 ml  Net          1280.83 ml     Assessment:  75 y.o. male is s/p:  Thrombolysis left leg bypass graft    Plan: -there is a palpable graft pulse down the calf to the PT where there is also a palpable pulse. -Thrombolysis per IR -continue heparin -hgb stable and rising -monitor leukocytosis -creatinine had improved yesterday, but the BMP is not back yet for  today. -hypokalemia-supplement    Doreatha Massed, PA-C [facility] [phone] 06/14/2016 7:19 AM   Agree with above.  Graft now patent no distal narrowing Will resume coumadin today.  D/c when INR greater than 2 resume heparin in 6 hours  Fabienne Bruns, MD [facility] [place] Office: [phone] Pager: [phone]

## 2016-06-14 NOTE — Progress Notes (Addendum)
ANTICOAGULATION CONSULT NOTE  Pharmacy Consult for Heparin Indication: atrial fibrillation and ischemic foot  Patient Measurements: Height: 5\' 9"  (175.3 cm) Weight: 129 lb 6.6 oz (58.7 kg) IBW/kg (Calculated) : 70.7 Heparin Dosing Weight: 58.7 kg  Vital Signs: Temp: 98.1 F (36.7 C) (07/31 0811) Temp Source: Oral (07/31 0811) BP: 192/67 (07/31 1200) Pulse Rate: 75 (07/31 1200)  Labs:  Recent Labs  06/12/16 1055  06/13/16 0411 06/13/16 1618 06/13/16 1631 06/13/16 2006 06/14/16 0011 06/14/16 0223 06/14/16 0622  HGB 11.7*  --  11.0* 11.4* 11.6*  --  12.6*  --  11.1*  HCT 34.9*  --  32.5* 34.0* 33.0*  --  37.7*  --  31.5*  PLT 144*  --  129* 126* 117*  --  134*  --  112*  LABPROT 19.6*  --  20.3*  --   --   --   --   --  18.1*  INR 1.64  --  1.71  --   --   --   --   --  1.48  HEPARINUNFRC  --   < > 0.43  --   --  <0.10*  --  0.29* 0.37  CREATININE 1.46*  --   --  1.30*  --   --   --   --  1.29*  < > = values in this interval not displayed.  Estimated Creatinine Clearance: 41.1 mL/min (by C-G formula based on SCr of 1.29 mg/dL).  Assessment: AN is 6 yoM who presents to the ED on 7/29 for an ischemic foot, on warfarin 4mg  daily for AFib, hx DVT, ischemic foot. Warfarin held and to IR thrombolysis w/ TPA on 7/30. Now s/p LLE arteriogram 7/31 - clearance of thrombus, no high-grade stenosis. TPA d/c'd, sheath removed. Pharmacy consulted to resume warfarin and heparin at 1900 tonight. Noted on levofloxacin and propafenone - may affect INR.  Previous HL was slightly low (0.29) on 950 units/h. INR 1.48. Hg 11.1, plt down 112, no bleed documented.  Goal of Therapy:  Heparin level 0.3-0.7 units/ml Monitor platelets by anticoagulation protocol: Yes   Plan:  - Restart heparin at 1000 units/h at 1900 per Vascular Surgery - Warfarin 4mg  x 1 dose tonight - Daily HL/CBC/INR - Monitor s/sx bleeding   Babs Bertin, PharmD, BCPS Clinical Pharmacist Pager [phone] 06/14/2016 1:26  PM

## 2016-06-15 DIAGNOSIS — E440 Moderate protein-calorie malnutrition: Secondary | ICD-10-CM | POA: Insufficient documentation

## 2016-06-15 LAB — CBC
HCT: 31.5 % — ABNORMAL LOW (ref 39.0–52.0)
HEMOGLOBIN: 10.7 g/dL — AB (ref 13.0–17.0)
MCH: 30 pg (ref 26.0–34.0)
MCHC: 34 g/dL (ref 30.0–36.0)
MCV: 88.2 fL (ref 78.0–100.0)
PLATELETS: 113 10*3/uL — AB (ref 150–400)
RBC: 3.57 MIL/uL — ABNORMAL LOW (ref 4.22–5.81)
RDW: 15.4 % (ref 11.5–15.5)
WBC: 9 10*3/uL (ref 4.0–10.5)

## 2016-06-15 LAB — GLUCOSE, CAPILLARY
GLUCOSE-CAPILLARY: 191 mg/dL — AB (ref 65–99)
GLUCOSE-CAPILLARY: 166 mg/dL — AB (ref 65–99)
Glucose-Capillary: 166 mg/dL — ABNORMAL HIGH (ref 65–99)
Glucose-Capillary: 87 mg/dL (ref 65–99)

## 2016-06-15 LAB — PROTIME-INR
INR: 1.42
PROTHROMBIN TIME: 17.5 s — AB (ref 11.4–15.2)

## 2016-06-15 LAB — HEPARIN LEVEL (UNFRACTIONATED)
HEPARIN UNFRACTIONATED: 0.25 [IU]/mL — AB (ref 0.30–0.70)
HEPARIN UNFRACTIONATED: 0.36 [IU]/mL (ref 0.30–0.70)
Heparin Unfractionated: 0.46 IU/mL (ref 0.30–0.70)

## 2016-06-15 MED ORDER — OXYCODONE HCL 5 MG PO TABS
5.0000 mg | ORAL_TABLET | ORAL | Status: DC | PRN
  Administered 2016-06-15 – 2016-06-18 (×10): 10 mg via ORAL
  Filled 2016-06-15 (×10): qty 2

## 2016-06-15 MED ORDER — HEPARIN (PORCINE) IN NACL 100-0.45 UNIT/ML-% IJ SOLN
1150.0000 [IU]/h | INTRAMUSCULAR | Status: DC
  Administered 2016-06-15 – 2016-06-18 (×4): 1150 [IU]/h via INTRAVENOUS
  Filled 2016-06-15 (×4): qty 250

## 2016-06-15 MED ORDER — GLUCERNA SHAKE PO LIQD
237.0000 mL | Freq: Three times a day (TID) | ORAL | Status: DC
  Administered 2016-06-15 – 2016-06-16 (×4): 237 mL via ORAL

## 2016-06-15 MED ORDER — WARFARIN SODIUM 3 MG PO TABS
6.0000 mg | ORAL_TABLET | Freq: Once | ORAL | Status: AC
  Administered 2016-06-15: 6 mg via ORAL
  Filled 2016-06-15: qty 2

## 2016-06-15 NOTE — Progress Notes (Signed)
ANTICOAGULATION CONSULT NOTE  Pharmacy Consult for Heparin Indication: atrial fibrillation and ischemic foot  Patient Measurements: Height: 5\' 9"  (175.3 cm) Weight: 129 lb 6.6 oz (58.7 kg) IBW/kg (Calculated) : 70.7 Heparin Dosing Weight: 58.7 kg  Vital Signs: Temp: 97.6 F (36.4 C) (08/01 1250) Temp Source: Oral (08/01 1250) BP: 144/61 (08/01 1400) Pulse Rate: 80 (08/01 1400)  Labs:  Recent Labs  06/13/16 0411 06/13/16 1618  06/14/16 0011  06/14/16 0622 06/15/16 0257 06/15/16 0302 06/15/16 1129 06/15/16 1907  HGB 11.0* 11.4*  < > 12.6*  --  11.1*  --  10.7*  --   --   HCT 32.5* 34.0*  < > 37.7*  --  31.5*  --  31.5*  --   --   PLT 129* 126*  < > 134*  --  112*  --  113*  --   --   LABPROT 20.3*  --   --   --   --  18.1* 17.5*  --   --   --   INR 1.71  --   --   --   --  1.48 1.42  --   --   --   HEPARINUNFRC 0.43  --   < >  --   < > 0.37 0.25*  --  0.46 0.36  CREATININE  --  1.30*  --   --   --  1.29*  --   --   --   --   < > = values in this interval not displayed.  Estimated Creatinine Clearance: 41.1 mL/min (by C-G formula based on SCr of 1.29 mg/dL).  Assessment: [name] is 40 yoM who presents to the ED on 7/29 for [name] ischemic foot, on warfarin 4mg  daily for AFib, hx DVT, ischemic foot. Warfarin held and to IR thrombolysis w/ TPA on 7/30. Now s/p LLE arteriogram 7/31 - clearance of thrombus, no high-grade stenosis. TPA d/c'd, sheath removed, warfarin and heparin resume per Pharmacy. Noted on levofloxacin and propafenone - may affect INR.  HL therapeutic (0.36) x2 on 1150 units/h. Hg low stable, plt stable 113, no bleed documented.  Goal of Therapy:  Heparin level 0.3-0.7 units/ml INR 2-3 Monitor platelets by anticoagulation protocol: Yes   Plan:  Continue Heparin at 1150 units/h - d/c when INR therapeutic Follow-up daily HL and CBC in AM.  Monitor s/sx bleeding  [name], PharmD, BCPS Clinical Pharmacist [phone] 06/15/2016 7:41 PM

## 2016-06-15 NOTE — Care Management Note (Signed)
Case Management Note  Patient Details  Name: [name] MRN: [id number] Date of Birth: 05-24-1941  Subjective/Objective:  Pt POD IR PTA               Action/Plan:  PTA independent from home with wife - uses walker and can for mobility when needed.  CM will continue to follow for discharge needs    Expected Discharge Date:                  Expected Discharge Plan:  Home/Self Care  In-House Referral:     Discharge planning Services  CM Consult  Post Acute Care Choice:    Choice offered to:     DME Arranged:    DME Agency:     HH Arranged:    HH Agency:     Status of Service:  In process, will continue to follow  If discussed at Long Length of Stay Meetings, dates discussed:    Additional Comments:  Cherylann Parr, RN 06/15/2016, 10:46 AM

## 2016-06-15 NOTE — Discharge Instructions (Signed)

## 2016-06-15 NOTE — Progress Notes (Signed)
ANTICOAGULATION CONSULT NOTE  Pharmacy Consult for Heparin Indication: atrial fibrillation and ischemic foot  Patient Measurements: Height: 5\' 9"  (175.3 cm) Weight: 129 lb 6.6 oz (58.7 kg) IBW/kg (Calculated) : 70.7 Heparin Dosing Weight: 58.7 kg  Vital Signs: Temp: 98.7 F (37.1 C) (08/01 0351) Temp Source: Oral (08/01 0351) BP: 141/60 (08/01 0200) Pulse Rate: 115 (08/01 0200)  Labs:  Recent Labs  06/12/16 1055  06/13/16 0411 06/13/16 1618  06/14/16 0011 06/14/16 0223 06/14/16 0622 06/15/16 0257 06/15/16 0302  HGB 11.7*  --  11.0* 11.4*  < > 12.6*  --  11.1*  --  10.7*  HCT 34.9*  --  32.5* 34.0*  < > 37.7*  --  31.5*  --  31.5*  PLT 144*  --  129* 126*  < > 134*  --  112*  --  113*  LABPROT 19.6*  --  20.3*  --   --   --   --  18.1* 17.5*  --   INR 1.64  --  1.71  --   --   --   --  1.48 1.42  --   HEPARINUNFRC  --   < > 0.43  --   < >  --  0.29* 0.37 0.25*  --   CREATININE 1.46*  --   --  1.30*  --   --   --  1.29*  --   --   < > = values in this interval not displayed.  Estimated Creatinine Clearance: 41.1 mL/min (by C-G formula based on SCr of 1.29 mg/dL).  Assessment: 89 yoM who presents to the ED on 7/29 for an ischemic foot, on warfarin 4mg  daily for AFib, hx DVT, ischemic foot. Warfarin held and to IR thrombolysis w/ TPA on 7/30. TPA infusion ended 7/31 and coumadin restarted. Remains on heparin bridge. Heparin level 0.25 (subtherapeutic ) on 1000 units/hr. No issues with line or bleeding reported per RN. Hgb, plt trending down a bit.  Goal of Therapy:  Heparin level 0.3-0.7 units/ml Monitor platelets by anticoagulation protocol: Yes   Plan:  - Increase heparin to 1150 units/hr - F/u 8 hr heparin level  Christoper Fabian, PharmD, BCPS Clinical pharmacist, pager [phone] 06/15/2016 4:00 AM

## 2016-06-15 NOTE — Progress Notes (Signed)
Referring Physician(s): Dr. Fabienne Bruns  Supervising Physician: Gilmer Mor  Patient Status:  Inpatient  Subjective: S/p successful thrombolysis of LLE bypass graft. Feeling much better. Sitting up in chair eating.   Allergies: Lopressor [metoprolol tartrate] and Niacin  Medications:  Current Facility-Administered Medications:  .  0.45 % sodium chloride infusion, , Intravenous, Continuous, Sherren Kerns, MD, Last Rate: 100 mL/hr at 06/15/16 0400 .  0.9 %  sodium chloride infusion, , Intravenous, Continuous, Lars Mage, PA-C, Last Rate: 10 mL/hr at 06/14/16 1200 .  0.9 %  sodium chloride infusion, 250 mL, Intravenous, PRN, Richarda Overlie, MD .  ALPRAZolam Prudy Feeler) tablet 0.25 mg, 0.25 mg, Oral, QHS PRN, Lars Mage, PA-C, 0.25 mg at 06/14/16 0036 .  alum & mag hydroxide-simeth (MAALOX/MYLANTA) 200-200-20 MG/5ML suspension 15-30 mL, 15-30 mL, Oral, Q2H PRN, Lars Mage, PA-C .  amLODipine (NORVASC) tablet 5 mg, 5 mg, Oral, Daily, Lars Mage, PA-C, 5 mg at 06/15/16 9604 .  aspirin EC tablet 81 mg, 81 mg, Oral, Daily, Lars Mage, PA-C, 81 mg at 06/15/16 5409 .  atorvastatin (LIPITOR) tablet 40 mg, 40 mg, Oral, q1800, Lars Mage, PA-C, 40 mg at 06/14/16 1702 .  cilostazol (PLETAL) tablet 100 mg, 100 mg, Oral, BID, Lars Mage, PA-C, 100 mg at 06/15/16 8119 .  cloNIDine (CATAPRES) tablet 0.1 mg, 0.1 mg, Oral, TID, Lars Mage, PA-C, 0.1 mg at 06/15/16 0911 .  docusate sodium (COLACE) capsule 200 mg, 200 mg, Oral, BID, Lars Mage, PA-C, 200 mg at 06/15/16 0910 .  glipiZIDE (GLUCOTROL) tablet 5 mg, 5 mg, Oral, BID AC, Lars Mage, PA-C, 5 mg at 06/15/16 0845 .  heparin ADULT infusion 100 units/mL (25000 units/264mL sodium chloride 0.45%), 1,150 Units/hr, Intravenous, Continuous, Titus Mould, [facility], Last Rate: 11.5 mL/hr at 06/15/16 0415, 1,150 Units/hr at 06/15/16 0415 .  levofloxacin (LEVAQUIN) tablet 500 mg, 500 mg, Oral, Q48H, Earnie Larsson, RPH,  500 mg at 06/14/16 2126 .  linagliptin (TRADJENTA) tablet 5 mg, 5 mg, Oral, Daily, Lars Mage, PA-C, 5 mg at 06/15/16 1478 .  losartan (COZAAR) tablet 100 mg, 100 mg, Oral, Daily, Lars Mage, PA-C, 100 mg at 06/15/16 2956 .  metoprolol tartrate (LOPRESSOR) tablet 25 mg, 25 mg, Oral, BID, Sherren Kerns, MD, 25 mg at 06/15/16 0911 .  midazolam (VERSED) injection 1 mg, 1 mg, Intravenous, Q1H PRN, Richarda Overlie, MD .  morphine 4 MG/ML injection 5 mg, 5 mg, Intravenous, Q1H PRN, Richarda Overlie, MD, 5 mg at 06/14/16 1308 .  ondansetron (ZOFRAN) injection 4 mg, 4 mg, Intravenous, Q6H PRN, Richarda Overlie, MD .  oxyCODONE (Oxy IR/ROXICODONE) immediate release tablet 15 mg, 15 mg, Oral, Q8H PRN, Lars Mage, PA-C, 15 mg at 06/15/16 0844 .  pantoprazole (PROTONIX) EC tablet 40 mg, 40 mg, Oral, BID, Lars Mage, PA-C, 40 mg at 06/15/16 2130 .  potassium chloride SA (K-DUR,KLOR-CON) CR tablet 20-40 mEq, 20-40 mEq, Oral, Once, Emma M Collins, PA-C .  propafenone (RYTHMOL) tablet 225 mg, 225 mg, Oral, BID, Emma M Collins, PA-C, 225 mg at 06/15/16 8657 .  sodium chloride flush (NS) 0.9 % injection 3 mL, 3 mL, Intravenous, Q12H, Richarda Overlie, MD, 3 mL at 06/15/16 0911 .  sodium chloride flush (NS) 0.9 % injection 3 mL, 3 mL, Intravenous, PRN, Richarda Overlie, MD .  Melene Muller ON 06/18/2016] Vitamin D (Ergocalciferol) (DRISDOL) capsule 50,000 Units, 50,000 Units, Oral, Q7 days, Sherren Kerns,  MD .  warfarin (COUMADIN) tablet 6 mg, 6 mg, Oral, ONCE-1800, Almon Hercules, [facility] .  Warfarin - Pharmacist Dosing Inpatient, , Does not apply, q1800, Almon Hercules, [facility]    Vital Signs: BP (!) 154/74 (BP Location: Left Arm)   Pulse 89   Temp 98.8 F (37.1 C) (Oral)   Resp 19   Ht 5\' 9"  (1.753 m)   Wt 129 lb 6.6 oz (58.7 kg)   SpO2 99%   BMI 19.11 kg/m   Physical Exam (L)LE warm to toes. Bounding pulse throught out graft and PT. Small, NT (L)groin stick site hematoma  Imaging: Ir Angiogram Extremity Left  Result Date:  06/13/2016 INDICATION: Left leg pain and ischemia. 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 Patient has limited surgical options based on previous angiography in the left lower extremity. Plan for catheter directed thrombolysis of the occluded vein graft. EXAM: LEFT LOWER EXTREMITY ARTERIOGRAM CATHETER DIRECTED THROMBOLYSIS, INITIAL STUDY ULTRASOUND GUIDANCE FOR VASCULAR ACCESS MEDICATIONS: None ANESTHESIA/SEDATION: Moderate (conscious) sedation was employed during this procedure. A total of Versed 2.0 mg and Fentanyl 100 mcg was administered intravenously. Moderate Sedation Time: 45 minutes. The patient's level of consciousness and vital signs were monitored continuously by radiology nursing throughout the procedure under my direct supervision. CONTRAST:  ISOVUE-300 IOPAMIDOL (ISOVUE-300) INJECTION 61% FLUOROSCOPY TIME:  Fluoroscopy Time: 18 minutes 30 seconds COMPLICATIONS: None immediate. PROCEDURE: Informed consent was obtained from the patient following explanation of the procedure, risks, benefits and alternatives. Specifically, the risks of catheter directed thrombolysis was discussed. The patient understands, agrees and consents for the procedure. All questions were addressed. A time out was performed prior to the initiation of the procedure. Maximal barrier sterile technique utilized including caps, mask, sterile gowns, sterile gloves, large sterile drape, hand hygiene, and Betadine prep. Ultrasound had demonstrated patency of the vein graft in the left thigh and upper calf. There is occlusion of the vein graft in the mid and distal calf. Patient continues to have Doppler signal at the left PT. The left upper thigh was anesthetized with 1% lidocaine. Using ultrasound guidance, a 21 gauge needle was directed into the patent vein graft in the upper thigh. A 0.018 wire was  placed and a micropuncture dilator set was placed. Left lower extremity arteriogram was performed. A 5 French vascular sheath was placed over a Bentson wire. Five [place] Vert catheter was advanced down the vein graft into the calf. Resistance meet in the mid calf thrombus. Eventually, a Glidewire was advanced through the thrombus and advanced into the upper ankle region. Eventually, a 50 cm infusion catheter was advanced over a stiff Glidewire and the tip was positioned in the lower calf. Advancement of this infusion catheter was quite difficult due to the thrombus in the mid calf. The sheath was sutured in place. Infusion wire was placed. TPA was started through the infusion catheter at 0.5 mg/hour. FINDINGS: Ultrasound demonstrated patency of the vein bypass graft in the thigh. 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 Large portion of this aneurysm contains thrombus. The graft was occluded in the mid and distal calf by ultrasound. Angiography: Very slow flow down the vein graft related to the distal occlusion. The vein graft is diffusely tortuous. 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  Occlusion of the vein graft in the mid calf. Occlusion of the vein graft at the distal anastomosis at the ankle. The infusion catheter was placed in the ankle near the medial malleolus. Proximal aspect of the infusion catheter is just above the knee. IMPRESSION: Initiation of catheter directed thrombolysis within the distally occluded vein graft in the left lower extremity. The vein graft is occluded in the mid and distal calf. The distal anastomosis appears to be occluded. TPA was started 0.5 mg/hour. Plan for follow-up angiography on 06/14/2016. Electronically Signed   By: Richarda Overlie M.D.   On: 06/13/2016 13:49  Ir US Guide Vasc Access Left  Result Date: 06/13/2016 INDICATION: Left leg pain and ischemia. Patient has a distally occluded  vein bypass in the left lower extremity. 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 Patient has limited surgical options based on previous angiography in the left lower extremity. Plan for catheter directed thrombolysis of the occluded vein graft. EXAM: LEFT LOWER EXTREMITY ARTERIOGRAM CATHETER DIRECTED THROMBOLYSIS, INITIAL STUDY ULTRASOUND GUIDANCE FOR VASCULAR ACCESS MEDICATIONS: None ANESTHESIA/SEDATION: Moderate (conscious) sedation was employed during this procedure. A total of Versed 2.0 mg and Fentanyl 100 mcg was administered intravenously. Moderate Sedation Time: 45 minutes. The patient's level of consciousness and vital signs were monitored continuously by radiology nursing throughout the procedure under my direct supervision. CONTRAST:  ISOVUE-300 IOPAMIDOL (ISOVUE-300) INJECTION 61% FLUOROSCOPY TIME:  Fluoroscopy Time: 18 minutes 30 seconds COMPLICATIONS: None immediate. PROCEDURE: Informed consent was obtained from the patient following explanation of the procedure, risks, benefits and alternatives. Specifically, the risks of catheter directed thrombolysis was discussed. The patient understands, agrees and consents for the procedure. All questions were addressed. A time out was performed prior to the initiation of the procedure. Maximal barrier sterile technique utilized including caps, mask, sterile gowns, sterile gloves, large sterile drape, hand hygiene, and Betadine prep. Ultrasound had demonstrated patency of the vein graft in the left thigh and upper calf. There is occlusion of the vein graft in the mid and distal calf. Patient continues to have Doppler signal at the left PT. The left upper thigh was anesthetized with 1% lidocaine. Using ultrasound guidance, a 21 gauge needle was directed into the patent vein graft in the upper thigh. A 0.018 wire was placed and a micropuncture dilator set was placed. Left lower extremity  arteriogram was performed. A 5 French vascular sheath was placed over a Bentson wire. Five [place] Vert catheter was advanced down the vein graft into the calf. Resistance meet in the mid calf thrombus. Eventually, a Glidewire was advanced through the thrombus and advanced into the upper ankle region. Eventually, a 50 cm infusion catheter was advanced over a stiff Glidewire and the tip was positioned in the lower calf. Advancement of this infusion catheter was quite difficult due to the thrombus in the mid calf. The sheath was sutured in place. Infusion wire was placed. TPA was started through the infusion catheter at 0.5 mg/hour. FINDINGS: Ultrasound demonstrated patency of the vein bypass graft in the thigh. 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 Large portion of this aneurysm contains thrombus. The graft was occluded in the mid and distal calf by ultrasound. Angiography: Very slow flow down the vein graft related to the distal occlusion. The vein graft is diffusely tortuous. 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 Occlusion  of the vein graft in the mid calf. Occlusion of the vein graft at the distal anastomosis at the ankle. The infusion catheter was placed in the ankle near the medial malleolus. Proximal aspect of the infusion catheter is just above the knee. IMPRESSION: Initiation of catheter directed thrombolysis within the distally occluded vein graft in the left lower extremity. The vein graft is occluded in the mid and distal calf. The distal anastomosis appears to be occluded. TPA was started 0.5 mg/hour. Plan for follow-up angiography on 06/14/2016. Electronically Signed   By: Richarda Overlie M.D.   On: 06/13/2016 13:49  Ir Infusion Thrombol Arterial Initial (ms)  Result Date: 06/13/2016 INDICATION: Left leg pain and ischemia. 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 History of vein  bypass graft from the left common femoral artery to the left posterior tibial artery. Patient has limited surgical options based on previous angiography in the left lower extremity. Plan for catheter directed thrombolysis of the occluded vein graft. EXAM: LEFT LOWER EXTREMITY ARTERIOGRAM CATHETER DIRECTED THROMBOLYSIS, INITIAL STUDY ULTRASOUND GUIDANCE FOR VASCULAR ACCESS MEDICATIONS: None ANESTHESIA/SEDATION: Moderate (conscious) sedation was employed during this procedure. A total of Versed 2.0 mg and Fentanyl 100 mcg was administered intravenously. Moderate Sedation Time: 45 minutes. The patient's level of consciousness and vital signs were monitored continuously by radiology nursing throughout the procedure under my direct supervision. CONTRAST:  ISOVUE-300 IOPAMIDOL (ISOVUE-300) INJECTION 61% FLUOROSCOPY TIME:  Fluoroscopy Time: 18 minutes 30 seconds COMPLICATIONS: None immediate. PROCEDURE: Informed consent was obtained from the patient following explanation of the procedure, risks, benefits and alternatives. Specifically, the risks of catheter directed thrombolysis was discussed. The patient understands, agrees and consents for the procedure. All questions were addressed. A time out was performed prior to the initiation of the procedure. Maximal barrier sterile technique utilized including caps, mask, sterile gowns, sterile gloves, large sterile drape, hand hygiene, and Betadine prep. Ultrasound had demonstrated patency of the vein graft in the left thigh and upper calf. There is occlusion of the vein graft in the mid and distal calf. Patient continues to have Doppler signal at the left PT. The left upper thigh was anesthetized with 1% lidocaine. Using ultrasound guidance, a 21 gauge needle was directed into the patent vein graft in the upper thigh. A 0.018 wire was placed and a micropuncture dilator set was placed. Left lower extremity arteriogram was performed. A 5 French vascular sheath was placed  over a Bentson wire. Five [place] Vert catheter was advanced down the vein graft into the calf. Resistance meet in the mid calf thrombus. Eventually, a Glidewire was advanced through the thrombus and advanced into the upper ankle region. Eventually, a 50 cm infusion catheter was advanced over a stiff Glidewire and the tip was positioned in the lower calf. Advancement of this infusion catheter was quite difficult due to the thrombus in the mid calf. The sheath was sutured in place. Infusion wire was placed. TPA was started through the infusion catheter at 0.5 mg/hour. FINDINGS: Ultrasound demonstrated patency of the vein bypass graft in the thigh. 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 Large portion of this aneurysm contains thrombus. The graft was occluded in the mid and distal calf by ultrasound. Angiography: Very slow flow down the vein graft related to the distal occlusion. The vein graft is diffusely tortuous. 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 Occlusion of  the vein graft in the mid calf. Occlusion of the vein graft at the distal anastomosis at the ankle. The infusion catheter was placed in the ankle near the medial malleolus. Proximal aspect of the infusion catheter is just above the knee. IMPRESSION: Initiation of catheter directed thrombolysis within the distally occluded vein graft in the left lower extremity. The vein graft is occluded in the mid and distal calf. The distal anastomosis appears to be occluded. TPA was started 0.5 mg/hour. Plan for follow-up angiography on 06/14/2016. Electronically Signed   By: Richarda Overlie M.D.   On: 06/13/2016 13:49  Ir Rande Lawman F/u Eval Art/ven Final Day (ms)  Result Date: 06/14/2016 CLINICAL DATA:  Occluded left lower extremity vein graft, status post overnight catheter directed thrombolytic infusion EXAM: IR THROMB F/U EVAL ART/VEN FINAL DAY ANESTHESIA/SEDATION: None required  MEDICATIONS: Lidocaine 1% subcutaneous CONTRAST:  96mL ISOVUE-300 IOPAMIDOL (ISOVUE-300) INJECTION 61% PROCEDURE: The procedure, risks (including but not limited to bleeding, infection, organ damage ), benefits, and alternatives were explained to the patient. Questions regarding the procedure were encouraged and answered. The patient understands and consents to the procedure. Contrast was injected through the side arm of the sheath in the left lower extremity vein graft for left lower extremity arteriography in multiple projections. After the surrounding skin cleansed with chlorhexidine and infiltrated locally with 1% lidocaine, The catheter and sheath were removed and hemostasis achieved with the aid of a 3-0 Ethilon purse-string suture. The patient tolerated the procedure well. COMPLICATIONS: None immediate FINDINGS: There is good antegrade flow in the left lower extremity vein graft. The graft is tortuous with aneurysmal segments evident. No residual intraluminal thrombus. No stenosis. The distal anastomosis to the posterior tibial artery is widely patent. Collaterals allow retrograde opacification of posterior tibial and peroneal arteries seen up to the level of the mid calf. Flow across the ankle is documented. IMPRESSION: 1. Technically successful overnight thrombolysis of distal left lower extremity vein graft thrombus. 2. There is no  residual thrombus or stenosis. 3. Lytic therapy was discontinued and the access sheath removed. Electronically Signed   By: Corlis Leak M.D.   On: 06/14/2016 16:30    Labs:  CBC:  Recent Labs  06/13/16 1631 06/14/16 0011 06/14/16 0622 06/15/16 0302  WBC 9.7 13.6* 9.8 9.0  HGB 11.6* 12.6* 11.1* 10.7*  HCT 33.0* 37.7* 31.5* 31.5*  PLT 117* 134* 112* 113*    COAGS:  Recent Labs  06/12/16 1055 06/13/16 0411 06/14/16 0622 06/15/16 0257  INR 1.64 1.71 1.48 1.42    BMP:  Recent Labs  06/02/16 1525 06/12/16 1055 06/13/16 1618 06/14/16 0622  06/14/16 1805  NA 135 135 134* 135  --   K 4.1 4.5 3.3* 3.3* 3.5  CL 102 105 104 106  --   CO2 25 24 22 23   --   GLUCOSE 165* 161* 146* 99  --   BUN 35* 28* 20 19  --   CALCIUM 9.2 9.4 8.8* 8.5*  --   CREATININE 2.05* 1.46* 1.30* 1.29*  --   GFRNONAA  --  45* 52* 53*  --   GFRAA  --  52* >60 >60  --     LIVER FUNCTION TESTS:  Recent Labs  06/02/16 1525 06/12/16 1055 06/13/16 1618  BILITOT 0.4 0.8 1.4*  AST 27 30 30   ALT 16 18 15*  ALKPHOS 71 64 57  PROT 7.4 7.0 6.5  ALBUMIN 3.7 3.1* 2.7*    Assessment and Plan: S/p successful thrombolysis of LLE  bypass graft. Plan per [facility] Call IR if needed.  Electronically Signed: Brayton El 06/15/2016, 9:53 AM   I spent a total of 15 Minutes at the the patient's bedside AND on the patient's hospital floor or unit, greater than 50% of which was counseling/coordinating care for lysis of LLE arterial bypass

## 2016-06-15 NOTE — Progress Notes (Signed)
Patient transferred to [facility] 2W15 by myself and Hyiu, RN. Report given to [place], Charity fundraiser. Pt. Transferred safely via wheelchair. Vital signs stable before, during and after transfer. All belongings were taken with patient. Family at bedside. Chart given to 2w staff at nursing desk. Donella Stade, RN

## 2016-06-15 NOTE — Progress Notes (Signed)
[name] CONSULT NOTE  Pharmacy Consult for Heparin Indication: atrial fibrillation and ischemic foot  Patient Measurements: Height: 5\' 9"  (175.3 cm) Weight: 129 lb 6.6 oz (58.7 kg) IBW/kg (Calculated) : 70.7 Heparin Dosing Weight: 58.7 kg  Vital Signs: Temp: 98.8 F (37.1 C) (08/01 0700) Temp Source: Oral (08/01 0700) BP: 135/55 (08/01 0700) Pulse Rate: 93 (08/01 0800)  Labs:  Recent Labs  06/12/16 1055  06/13/16 0411 06/13/16 1618  06/14/16 0011 06/14/16 0223 06/14/16 0622 06/15/16 0257 06/15/16 0302  HGB 11.7*  --  11.0* 11.4*  < > 12.6*  --  11.1*  --  10.7*  HCT 34.9*  --  32.5* 34.0*  < > 37.7*  --  31.5*  --  31.5*  PLT 144*  --  129* 126*  < > 134*  --  112*  --  113*  LABPROT 19.6*  --  20.3*  --   --   --   --  18.1* 17.5*  --   INR 1.64  --  1.71  --   --   --   --  1.48 1.42  --   HEPARINUNFRC  --   < > 0.43  --   < >  --  0.29* 0.37 0.25*  --   CREATININE 1.46*  --   --  1.30*  --   --   --  1.29*  --   --   < > = values in this interval not displayed.  Estimated Creatinine Clearance: 41.1 mL/min (by C-G formula based on SCr of 1.29 mg/dL).  Assessment: AN is 21 yoM who presents to the ED on 7/29 for an ischemic foot, on warfarin 4mg  daily for AFib, hx DVT, ischemic foot. Warfarin held and to IR thrombolysis w/ TPA on 7/30. Now s/p LLE arteriogram 7/31 - clearance of thrombus, no high-grade stenosis. TPA d/c'd, sheath removed, warfarin and heparin resume per Pharmacy. Noted on levofloxacin and propafenone - may affect INR.  HL therapeutic (0.46) x1 on 1150 units/h. INR 1.42 stable s/p 1 dose. Hg low stable, plt stable 113, no bleed documented.  Goal of Therapy:  Heparin level 0.3-0.7 units/ml INR 2-3 Monitor platelets by [name] protocol: Yes   Plan:  - Heparin at 1150 units/h - d/c when INR therapeutic - Warfarin 6mg  x 1 dose tonight - 6h HL to confirm, daily HL/CBC/INR - Monitor s/sx bleeding   Babs Bertin, PharmD, [facility] Clinical  Pharmacist Pager [phone] 06/15/2016 8:48 AM

## 2016-06-15 NOTE — Progress Notes (Addendum)
[facility] [place]  Subjective  - Feeling much better.   Objective (!) 135/55 85 98.7 F (37.1 C) (Oral) 19 98%  Intake/Output Summary (Last 24 hours) at 06/15/16 0744 Last data filed at 06/15/16 0700  Gross per 24 hour  Intake          3019.13 ml  Output             1455 ml  Net          1564.13 ml    Palpable pulse throughout the entire by-pass to the PT Left groin with hematoma at stick site  Assessment/Planning: POD #32 IR TPA   will transfer to 2W D/C when INR is therapeutic  Thomasena Edis, EMMA [facility] 06/15/2016 7:44 AM -- Easily palpable PT pulse Foot subjectively better Was on Levaquin for pneumonia prior to admit.  D/c after Wednesday Continue coumadin D/c when INR greater than 2  Fabienne Bruns, MD [facility] [place] Office: [phone] Pager: [phone]  Laboratory Lab Results:  Recent Labs  06/14/16 0622 06/15/16 0302  WBC 9.8 9.0  HGB 11.1* 10.7*  HCT 31.5* 31.5*  PLT 112* 113*   BMET  Recent Labs  06/13/16 1618 06/14/16 0622 06/14/16 1805  NA 134* 135  --   K 3.3* 3.3* 3.5  CL 104 106  --   CO2 22 23  --   GLUCOSE 146* 99  --   BUN 20 19  --   CREATININE 1.30* 1.29*  --   CALCIUM 8.8* 8.5*  --     COAG Lab Results  Component Value Date   INR 1.42 06/15/2016   INR 1.48 06/14/2016   INR 1.71 06/13/2016   No results found for: PTT

## 2016-06-15 NOTE — Progress Notes (Addendum)
Initial Nutrition Assessment  DOCUMENTATION CODES:   Non-severe (moderate) malnutrition in context of chronic illness  INTERVENTION:    Glucerna Shake po TID, each supplement provides 220 kcal and 10 grams of protein  NUTRITION DIAGNOSIS:   Malnutrition related to chronic illness as evidenced by moderate depletion of body fat, moderate depletions of muscle mass  GOAL:   Patient will meet greater than or equal to 90% of their needs  MONITOR:   PO intake, Supplement acceptance, Labs, Weight trends, I & O's  REASON FOR ASSESSMENT:   Malnutrition Screening Tool  ASSESSMENT:   75 yo Male with history of AAA anemia, arthritis, A. Fib, COPD, DVT on Coumadin, gout, hyperlipidemia, diabetes, history of stroke, presenting to the ED with left foot pain and discoloration.  Patient s/p procedure per IR 7/30: IR INFUSION THROMBOL ARTERIAL INITIAL (MS)  Spoke with pt at bedside >> about to eat his breakfast. He reports a decreased appetite PTA. Also states he's lost a significant amount of weight since "the [place]" changed his medication >> for DM? He is amenable to receiving oral nutrition supplements >> will order. Nutrition-Focused physical exam completed. Findings are moderate fat depletion, moderate muscle depletion, and no edema.   Diet Order:  Diet Carb Modified Fluid consistency: Thin; Room service appropriate? Yes  Skin:  Reviewed, no issues  Last BM:  8/1  Height:   Ht Readings from Last 1 Encounters:  06/12/16 5\' 9"  (1.753 m)    Weight:   Wt Readings from Last 1 Encounters:  06/12/16 129 lb 6.6 oz (58.7 kg)    CBG (last 3)   Recent Labs  06/14/16 2344 06/15/16 0349 06/15/16 0748  GLUCAP 95 191* 166*    Ideal Body Weight:  73 kg  BMI:  Body mass index is 19.11 kg/m.  Estimated Nutritional Needs:   Kcal:  1600-1800  Protein:  70-80 gm  Fluid:  1.6-1.8 L  EDUCATION NEEDS:   No education needs identified at this time  Maureen Chatters, RD,  LDN Pager #: [phone] After-Hours Pager #: [phone]

## 2016-06-16 LAB — CBC
HCT: 26 % — ABNORMAL LOW (ref 39.0–52.0)
Hemoglobin: 8.7 g/dL — ABNORMAL LOW (ref 13.0–17.0)
MCH: 29.7 pg (ref 26.0–34.0)
MCHC: 33.5 g/dL (ref 30.0–36.0)
MCV: 88.7 fL (ref 78.0–100.0)
PLATELETS: 96 10*3/uL — AB (ref 150–400)
RBC: 2.93 MIL/uL — ABNORMAL LOW (ref 4.22–5.81)
RDW: 15.9 % — AB (ref 11.5–15.5)
WBC: 5.6 10*3/uL (ref 4.0–10.5)

## 2016-06-16 LAB — PROTIME-INR
INR: 1.48
Prothrombin Time: 18.1 seconds — ABNORMAL HIGH (ref 11.4–15.2)

## 2016-06-16 LAB — HEPARIN LEVEL (UNFRACTIONATED): Heparin Unfractionated: 0.47 IU/mL (ref 0.30–0.70)

## 2016-06-16 MED ORDER — WARFARIN SODIUM 3 MG PO TABS
6.0000 mg | ORAL_TABLET | Freq: Once | ORAL | Status: AC
  Administered 2016-06-16: 6 mg via ORAL
  Filled 2016-06-16: qty 2

## 2016-06-16 MED ORDER — HYDRALAZINE HCL 20 MG/ML IJ SOLN
10.0000 mg | INTRAMUSCULAR | Status: DC | PRN
  Administered 2016-06-16: 10 mg via INTRAVENOUS
  Filled 2016-06-16: qty 1

## 2016-06-16 NOTE — Progress Notes (Signed)
Patient with BP 203/78, patient in pain at this time, pain medication given as ordered as needed for pain.Also Patient's  scheduled BP medication given. Will monitor patient. Sukhman Martine, Randall An RN

## 2016-06-16 NOTE — Progress Notes (Signed)
Patient ambulated in hallway with wife x2 will monitor patient. Tressy Kunzman, Randall An RN

## 2016-06-16 NOTE — Progress Notes (Signed)
Patient with elevated BP this afternoon, 189/72 Dr. Darrick Penna in OR and made aware through OR staff, orders received. Will continue to monitor patient. Braydan Marriott, Randall An  RN

## 2016-06-16 NOTE — Care Management Important Message (Signed)
Important Message  Patient Details  Name: [name] MRN: [id number] Date of Birth: Oct 26, 1941   Medicare Important Message Given:  Yes    [name] 06/16/2016, 10:40 AM

## 2016-06-16 NOTE — Progress Notes (Signed)
ANTICOAGULATION CONSULT NOTE  Pharmacy Consult for Heparin Indication: atrial fibrillation and ischemic foot  Patient Measurements: Height: 5\' 9"  (175.3 cm) Weight: 129 lb 6.6 oz (58.7 kg) IBW/kg (Calculated) : 70.7 Heparin Dosing Weight: 58.7 kg  Vital Signs: Temp: 98.7 F (37.1 C) (08/02 0424) Temp Source: Oral (08/02 0424) BP: 133/58 (08/02 0424) Pulse Rate: 77 (08/02 0424)  Labs:  Recent Labs  06/13/16 1618  06/14/16 0622 06/15/16 0257 06/15/16 0302 06/15/16 1129 06/15/16 1907 06/16/16 0248  HGB 11.4*  < > 11.1*  --  10.7*  --   --  8.7*  HCT 34.0*  < > 31.5*  --  31.5*  --   --  26.0*  PLT 126*  < > 112*  --  113*  --   --  96*  LABPROT  --   --  18.1* 17.5*  --   --   --  18.1*  INR  --   --  1.48 1.42  --   --   --  1.48  HEPARINUNFRC  --   < > 0.37 0.25*  --  0.46 0.36 0.47  CREATININE 1.30*  --  1.29*  --   --   --   --   --   < > = values in this interval not displayed.  Estimated Creatinine Clearance: 41.1 mL/min (by C-G formula based on SCr of 1.29 mg/dL).  Assessment: AN is 39 yoM who presents to the ED on 7/29 for an ischemic foot, on warfarin 4mg  daily for AFib, hx DVT, ischemic foot. Warfarin held and to IR thrombolysis w/ TPA on 7/30. Now s/p LLE arteriogram 7/31 - clearance of thrombus, no high-grade stenosis. TPA d/c'd, sheath removed, warfarin and heparin resume per Pharmacy. Noted on levofloxacin and propafenone - may affect INR.  HL therapeutic 0.47 on 1150 units/h. INR 1.48 after 2nd warfarin dose. Hg trending down 11.7>8.7, plt trending down 144>96, no bleed documented. Underwent thrombolysis of LLE bypass graft on 8/1  Goal of Therapy:  Heparin level 0.3-0.7 units/ml INR 2-3 Monitor platelets by anticoagulation protocol: Yes   Plan:  - Heparin at 1150 units/h - d/c when INR therapeutic - Warfarin 6mg  x 1 dose tonight - Daily HL/CBC/INR - Monitor s/sx bleeding  Ruben Im, PharmD Clinical Pharmacist Pager: [phone] 06/16/2016  8:41 AM

## 2016-06-16 NOTE — Progress Notes (Addendum)
[facility] [place]  Subjective  - Doing better with pain medication changes.   Objective (!) 133/58 77 98.7 F (37.1 C) (Oral) 17 97%  Intake/Output Summary (Last 24 hours) at 06/16/16 0724 Last data filed at 06/15/16 1838  Gross per 24 hour  Intake             1269 ml  Output              600 ml  Net              669 ml    Palpable graft pulse to the PT left LE   Assessment/Planning: POD # 3 IR TPA  Pending therapeutic INR for discharge  Clinton Gallant [facility] 06/16/2016 7:24 AM --  2+ left PT pulse, foot warm  Significant drop in hemoglobin, thrombocytopenia.  Will check HIT tomorrow if plt continue to fall. ? Dilution.  Has 5 x 3 cm hematoma at stick site but no real evidence of ongoing bleeding.  Wife states he has had anemia of unknown etiology in past and required transfusion  Continue to trend for now. D/c when INR > 2 and hemoglobin stable D/c Levaquin after today.  This was completion of a 10 day course for pneumonia prior to admission.  Fabienne Bruns, MD [facility] [place] Office: [phone] Pager: [phone]  Laboratory Lab Results:  Recent Labs  06/15/16 0302 06/16/16 0248  WBC 9.0 5.6  HGB 10.7* 8.7*  HCT 31.5* 26.0*  PLT 113* 96*   BMET  Recent Labs  06/13/16 1618 06/14/16 0622 06/14/16 1805  NA 134* 135  --   K 3.3* 3.3* 3.5  CL 104 106  --   CO2 22 23  --   GLUCOSE 146* 99  --   BUN 20 19  --   CREATININE 1.30* 1.29*  --   CALCIUM 8.8* 8.5*  --     COAG Lab Results  Component Value Date   INR 1.48 06/16/2016   INR 1.42 06/15/2016   INR 1.48 06/14/2016   No results found for: PTT

## 2016-06-17 LAB — CBC
HEMATOCRIT: 26 % — AB (ref 39.0–52.0)
HEMOGLOBIN: 8.8 g/dL — AB (ref 13.0–17.0)
MCH: 30 pg (ref 26.0–34.0)
MCHC: 33.8 g/dL (ref 30.0–36.0)
MCV: 88.7 fL (ref 78.0–100.0)
Platelets: 104 10*3/uL — ABNORMAL LOW (ref 150–400)
RBC: 2.93 MIL/uL — AB (ref 4.22–5.81)
RDW: 15.8 % — ABNORMAL HIGH (ref 11.5–15.5)
WBC: 6.7 10*3/uL (ref 4.0–10.5)

## 2016-06-17 LAB — PROTIME-INR
INR: 1.58
Prothrombin Time: 19 seconds — ABNORMAL HIGH (ref 11.4–15.2)

## 2016-06-17 LAB — GLUCOSE, CAPILLARY
GLUCOSE-CAPILLARY: 206 mg/dL — AB (ref 65–99)
Glucose-Capillary: 116 mg/dL — ABNORMAL HIGH (ref 65–99)
Glucose-Capillary: 109 mg/dL — ABNORMAL HIGH (ref 65–99)

## 2016-06-17 LAB — HEPARIN LEVEL (UNFRACTIONATED): Heparin Unfractionated: 0.5 IU/mL (ref 0.30–0.70)

## 2016-06-17 MED ORDER — WARFARIN SODIUM 3 MG PO TABS
6.0000 mg | ORAL_TABLET | Freq: Once | ORAL | Status: AC
  Administered 2016-06-17: 6 mg via ORAL
  Filled 2016-06-17: qty 2

## 2016-06-17 MED ORDER — FERROUS SULFATE 325 (65 FE) MG PO TABS
325.0000 mg | ORAL_TABLET | Freq: Three times a day (TID) | ORAL | Status: DC
  Administered 2016-06-17 – 2016-06-19 (×5): 325 mg via ORAL
  Filled 2016-06-17 (×5): qty 1

## 2016-06-17 NOTE — Progress Notes (Addendum)
[facility] [place]  Subjective  - Doing well.  Wants to know if we can cut down on the fluids he is voiding a lot.    Objective 120/64 80 98.2 F (36.8 C) (Oral) 18 98%  Intake/Output Summary (Last 24 hours) at 06/17/16 0727 Last data filed at 06/17/16 8022  Gross per 24 hour  Intake             1037 ml  Output             3025 ml  Net            -1988 ml    Palpable graft to PT Hematoma without change    Assessment/Planning: POD # 4 IR TPA  Thrombocytopenia PLT 104 this am stable was 96 HGB 8.8 up from 8.7 INR 1.58 Maintain heparin until coumadin INR therapeutic Levaquin stopped yesterday Stopped IV fluids, UO > 400 cc /hr   COLLINS, EMMA MAUREEN 06/17/2016 7:27 AM -- Minimize IV fluid, heparin only Hemoglobin stable INR slowly trending up  2+ PT pulse  Home when INR 2  Fabienne Bruns, MD [facility] [place] Office: [phone] Pager: [phone]  Laboratory Lab Results:  Recent Labs  06/16/16 0248 06/17/16 0316  WBC 5.6 6.7  HGB 8.7* 8.8*  HCT 26.0* 26.0*  PLT 96* 104*   BMET  Recent Labs  06/14/16 1805  K 3.5    COAG Lab Results  Component Value Date   INR 1.58 06/17/2016   INR 1.48 06/16/2016   INR 1.42 06/15/2016   No results found for: PTT

## 2016-06-17 NOTE — Progress Notes (Addendum)
ANTICOAGULATION CONSULT NOTE  Pharmacy Consult for Heparin Indication: atrial fibrillation and ischemic foot  Patient Measurements: Height: 5\' 9"  (175.3 cm) Weight: 129 lb 6.6 oz (58.7 kg) IBW/kg (Calculated) : 70.7 Heparin Dosing Weight: 58.7 kg  Vital Signs: Temp: 97.9 F (36.6 C) (08/03 0945) Temp Source: Oral (08/03 0945) BP: 146/70 (08/03 0945) Pulse Rate: 70 (08/03 0945)  Labs:  Recent Labs  06/15/16 0257  06/15/16 0302  06/15/16 1907 06/16/16 0248 06/17/16 0316  HGB  --   < > 10.7*  --   --  8.7* 8.8*  HCT  --   --  31.5*  --   --  26.0* 26.0*  PLT  --   --  113*  --   --  96* 104*  LABPROT 17.5*  --   --   --   --  18.1* 19.0*  INR 1.42  --   --   --   --  1.48 1.58  HEPARINUNFRC 0.25*  --   --   < > 0.36 0.47 0.50  < > = values in this interval not displayed.  Estimated Creatinine Clearance: 41.1 mL/min (by C-G formula based on SCr of 1.29 mg/dL).  Assessment: [name] is 58 yoM who presents to the ED on 7/29 for [name] ischemic foot, on warfarin 4mg  daily for AFib, hx DVT, ischemic foot. Warfarin held and to IR thrombolysis w/ TPA on 7/30. Now s/p LLE arteriogram 7/31 - clearance of thrombus, no high-grade stenosis. TPA d/c'd, sheath removed, warfarin and heparin resume per Pharmacy. Noted on propafenone - may affect INR.  HL therapeutic 0.50 on 1150 units/h. INR 1.58 after 3rd warfarin dose. Hg 8.8 and Plt 104 stable with no bleed documented. Underwent thrombolysis of LLE bypass graft on 8/1  Goal of Therapy:  Heparin level 0.3-0.7 units/ml INR 2-3 Monitor platelets by anticoagulation protocol: Yes   Plan:  - Heparin at 1150 units/h - d/c when INR therapeutic - Warfarin 6mg  x 1 dose tonight - Daily HL/CBC/INR - Monitor s/sx bleeding  Ruben Im, PharmD Clinical Pharmacist Pager: [phone] 06/17/2016 10:32 AM

## 2016-06-18 ENCOUNTER — Encounter (HOSPITAL_COMMUNITY): Payer: Medicare Other

## 2016-06-18 LAB — BASIC METABOLIC PANEL
Anion gap: 5 (ref 5–15)
BUN: 14 mg/dL (ref 6–20)
CHLORIDE: 107 mmol/L (ref 101–111)
CO2: 27 mmol/L (ref 22–32)
CREATININE: 1.31 mg/dL — AB (ref 0.61–1.24)
Calcium: 8.5 mg/dL — ABNORMAL LOW (ref 8.9–10.3)
GFR calc Af Amer: 60 mL/min — ABNORMAL LOW (ref >60)
GFR calc non Af Amer: 52 mL/min — ABNORMAL LOW (ref >60)
GLUCOSE: 147 mg/dL — AB (ref 65–99)
Potassium: 3.7 mmol/L (ref 3.5–5.1)
SODIUM: 139 mmol/L (ref 135–145)

## 2016-06-18 LAB — GLUCOSE, CAPILLARY
GLUCOSE-CAPILLARY: 143 mg/dL — AB (ref 65–99)
GLUCOSE-CAPILLARY: 186 mg/dL — AB (ref 65–99)
Glucose-Capillary: 207 mg/dL — ABNORMAL HIGH (ref 65–99)
Glucose-Capillary: 95 mg/dL (ref 65–99)

## 2016-06-18 LAB — CBC
HCT: 24.4 % — ABNORMAL LOW (ref 39.0–52.0)
Hemoglobin: 8.2 g/dL — ABNORMAL LOW (ref 13.0–17.0)
MCH: 30.6 pg (ref 26.0–34.0)
MCHC: 33.6 g/dL (ref 30.0–36.0)
MCV: 91 fL (ref 78.0–100.0)
PLATELETS: 100 10*3/uL — AB (ref 150–400)
RBC: 2.68 MIL/uL — ABNORMAL LOW (ref 4.22–5.81)
RDW: 16.4 % — AB (ref 11.5–15.5)
WBC: 5.3 10*3/uL (ref 4.0–10.5)

## 2016-06-18 LAB — PROTIME-INR
INR: 1.67
Prothrombin Time: 19.9 seconds — ABNORMAL HIGH (ref 11.4–15.2)

## 2016-06-18 LAB — HEPARIN LEVEL (UNFRACTIONATED): HEPARIN UNFRACTIONATED: 0.54 [IU]/mL (ref 0.30–0.70)

## 2016-06-18 MED ORDER — MORPHINE SULFATE (PF) 2 MG/ML IV SOLN: 2.0000 mg | INTRAVENOUS | Status: DC | PRN

## 2016-06-18 MED ORDER — OXYCODONE HCL 5 MG PO TABS
20.0000 mg | ORAL_TABLET | Freq: Three times a day (TID) | ORAL | Status: DC | PRN
  Administered 2016-06-18 – 2016-06-19 (×2): 20 mg via ORAL
  Filled 2016-06-18 (×2): qty 4

## 2016-06-18 MED ORDER — OXYCODONE HCL 5 MG PO TABS: 15.0000 mg | ORAL_TABLET | Freq: Three times a day (TID) | ORAL | Status: DC | PRN

## 2016-06-18 MED ORDER — WARFARIN SODIUM 7.5 MG PO TABS
7.5000 mg | ORAL_TABLET | Freq: Once | ORAL | Status: AC
  Administered 2016-06-18: 7.5 mg via ORAL
  Filled 2016-06-18: qty 1

## 2016-06-18 NOTE — Care Management Important Message (Signed)
Important Message  Patient Details  Name: [name] MRN: [id number] Date of Birth: 10/21/1941   Medicare Important Message Given:  Yes    Madylin Fairbank Abena 06/18/2016, 11:01 AM

## 2016-06-18 NOTE — Progress Notes (Signed)
ANTICOAGULATION CONSULT NOTE  Pharmacy Consult for Heparin Indication: atrial fibrillation and ischemic foot  Patient Measurements: Height: 5\' 9"  (175.3 cm) Weight: 129 lb 6.6 oz (58.7 kg) IBW/kg (Calculated) : 70.7 Heparin Dosing Weight: 58.7 kg  Vital Signs: Temp: 97.4 F (36.3 C) (08/04 1026) Temp Source: Oral (08/04 1026) BP: 152/53 (08/04 1030) Pulse Rate: 71 (08/04 1026)  Labs:  Recent Labs  06/16/16 0248 06/17/16 0316 06/18/16 0614  HGB 8.7* 8.8* 8.2*  HCT 26.0* 26.0* 24.4*  PLT 96* 104* 100*  LABPROT 18.1* 19.0* 19.9*  INR 1.48 1.58 1.67  HEPARINUNFRC 0.47 0.50 0.54  CREATININE  --   --  1.31*    Estimated Creatinine Clearance: 40.5 mL/min (by C-G formula based on SCr of 1.31 mg/dL).  Assessment: Anticoagulation: Hep for hx AF, DVT, ischemic foot (pta warf). IR for thrombolysis w/ TPA 7/30. LLE arteriogram 7/31 - clearance of thrombus, no high-grade stenosis. TPA d/c'd, sheath removed, resume hep/warf. No bleed issues documented. Underwent thrombolysis of LLE bypass graft on 8/1 fibrinogen wnl. HL 0.54. INR 1.67, Hgb 8.2 down. Plts 100 stable. - Coumadin PTA dose: 4 mg daily  Goal of Therapy:  Heparin level 0.3-0.7 units/ml INR 2-3 Monitor platelets by anticoagulation protocol: Yes   Plan:  - Heparin at 1150 units/h - d/c when INR therapeutic - Warfarin 7.5 mg x 1 dose tonight - Daily HL/CBC/INR - Monitor s/sx bleeding  Zareen Jamison S. Merilynn Finland, PharmD, [facility] Clinical Staff Pharmacist Pager [phone]  06/18/2016 10:58 AM

## 2016-06-18 NOTE — Progress Notes (Addendum)
Progress Note    06/18/2016 10:46 AM * No surgery found *  Subjective:  C/o back pain;  Foot feels good.  Afebrile HR  70's NSR 120's-150's systolic  98% RA  Vitals:   06/18/16 1026 06/18/16 1030  BP:  (!) 152/53  Pulse: 71   Resp: 18   Temp: 97.4 F (36.3 C)     Physical Exam: Cardiac:  regular Lungs:  Non labored Extremities:  2+ palpable left PT pulse   CBC    Component Value Date/Time   WBC 5.3 06/18/2016 0614   RBC 2.68 (L) 06/18/2016 0614   HGB 8.2 (L) 06/18/2016 0614   HCT 24.4 (L) 06/18/2016 0614   PLT 100 (L) 06/18/2016 0614   MCV 91.0 06/18/2016 0614   MCH 30.6 06/18/2016 0614   MCHC 33.6 06/18/2016 0614   RDW 16.4 (H) 06/18/2016 0614   LYMPHSABS 2.1 06/12/2016 1055   MONOABS 0.9 06/12/2016 1055   EOSABS 0.3 06/12/2016 1055   BASOSABS 0.0 06/12/2016 1055    BMET    Component Value Date/Time   NA 139 06/18/2016 0614   K 3.7 06/18/2016 0614   CL 107 06/18/2016 0614   CO2 27 06/18/2016 0614   GLUCOSE 147 (H) 06/18/2016 0614   BUN 14 06/18/2016 0614   CREATININE 1.31 (H) 06/18/2016 0614   CALCIUM 8.5 (L) 06/18/2016 0614   GFRNONAA 52 (L) 06/18/2016 0614   GFRAA 60 (L) 06/18/2016 0614    INR    Component Value Date/Time   INR 1.67 06/18/2016 0614     Intake/Output Summary (Last 24 hours) at 06/18/16 1046 Last data filed at 06/18/16 0710  Gross per 24 hour  Intake              720 ml  Output             1870 ml  Net            -1150 ml     Assessment:  75 y.o. male is S/p successful thrombolysis of LLE bypass graft by IR   -pt doing well this morning with easily palpable left PT pulse. -family member inquiring about pain medication for pt's back pain as due to rule changes, pt's pain meds have been cut back.  I explained to her that we would not be able to prescribe the pain meds that she needs b/c he really doesn't need it for the procedure he had here.  I explained that [facility] would [facility], [facility] or his back doctor in  the near future.  He is currently getting 10mg  of Oxycodone every 4 hrs with Morphine 5mg  every few hours.  Will increase the Oxy back to his home dose and try to cut back on the Morphine as he will not be able to discharge on this.  His wife states he does well with 20mg  of Oxycodone every 8 hrs and this is what is ordered.  Changed Morphine to 2-3 every 4 hours. -creatinine stable -hgb down slightly from yesterday-pt tolerating.  Continue to monitor. -INR is 1.67 today-heparin/coumadin bridge per pharmacy.  Hopefully will be therapeutic by Sunday.  He resides at home. -pt's family member states she walked with him a couple of times yesterday.  Continue mobilization.     Doreatha Massed, PA-C [facility] [phone] 06/18/2016 10:46 AM   2+ PT pulse Peri sheath hematoma stable INR slowly trending up  Need to remove bolster suture prior to d/c Home  when INR > 2 Chronic back pain management as above. Resume pts iron tablets for anemia  Fabienne Bruns, MD [facility] [place] Office: [phone] Pager: [phone]

## 2016-06-18 NOTE — Progress Notes (Addendum)
Patient ambulated  with wife in the hallway using a front wheel walker. On arrival to the room BP was 169/70,  Scheduled BP medicine and pain medicine given, will recheck BP and continue to monitor

## 2016-06-19 LAB — CBC
HEMATOCRIT: 22.2 % — AB (ref 39.0–52.0)
HEMOGLOBIN: 7.5 g/dL — AB (ref 13.0–17.0)
MCH: 30.5 pg (ref 26.0–34.0)
MCHC: 33.8 g/dL (ref 30.0–36.0)
MCV: 90.2 fL (ref 78.0–100.0)
Platelets: 120 10*3/uL — ABNORMAL LOW (ref 150–400)
RBC: 2.46 MIL/uL — AB (ref 4.22–5.81)
RDW: 16.2 % — ABNORMAL HIGH (ref 11.5–15.5)
WBC: 6.3 10*3/uL (ref 4.0–10.5)

## 2016-06-19 LAB — GLUCOSE, CAPILLARY: GLUCOSE-CAPILLARY: 105 mg/dL — AB (ref 65–99)

## 2016-06-19 LAB — HEPARIN LEVEL (UNFRACTIONATED): HEPARIN UNFRACTIONATED: 0.35 [IU]/mL (ref 0.30–0.70)

## 2016-06-19 LAB — PROTIME-INR
INR: 2.07
PROTHROMBIN TIME: 23.6 s — AB (ref 11.4–15.2)

## 2016-06-19 MED ORDER — OXYCODONE HCL 20 MG PO TABS: 20.0000 mg | ORAL_TABLET | Freq: Three times a day (TID) | ORAL | 0 refills | Status: AC | PRN

## 2016-06-19 NOTE — Progress Notes (Addendum)
  Progress Note 06/19/2016 9:03 AM  Subjective:  Says he's bruised pretty good on his left thigh  Afebrile HR 70's  140's-150's systolic 99% RA  Vitals:   06/18/16 2214 06/19/16 0300  BP: (!) 150/45 (!) 145/53  Pulse: 70 73  Resp:  18  Temp:  98.2 F (36.8 C)   Physical Exam: Cardiac:  regular Lungs:  Non labored Incisions:  Right thigh with ecchymosis  Extremities:  Easily palpable left PT pulse  CBC    Component Value Date/Time   WBC 6.3 06/19/2016 0227   RBC 2.46 (L) 06/19/2016 0227   HGB 7.5 (L) 06/19/2016 0227   HCT 22.2 (L) 06/19/2016 0227   PLT 120 (L) 06/19/2016 0227   MCV 90.2 06/19/2016 0227   MCH 30.5 06/19/2016 0227   MCHC 33.8 06/19/2016 0227   RDW 16.2 (H) 06/19/2016 0227   LYMPHSABS 2.1 06/12/2016 1055   MONOABS 0.9 06/12/2016 1055   EOSABS 0.3 06/12/2016 1055   BASOSABS 0.0 06/12/2016 1055   BMET    Component Value Date/Time   NA 139 06/18/2016 0614   K 3.7 06/18/2016 0614   CL 107 06/18/2016 0614   CO2 27 06/18/2016 0614   GLUCOSE 147 (H) 06/18/2016 0614   BUN 14 06/18/2016 0614   CREATININE 1.31 (H) 06/18/2016 0614   CALCIUM 8.5 (L) 06/18/2016 0614   GFRNONAA 52 (L) 06/18/2016 0614   GFRAA 60 (L) 06/18/2016 0614   INR    Component Value Date/Time   INR 2.07 06/19/2016 0227    Intake/Output Summary (Last 24 hours) at 06/19/16 0903 Last data filed at 06/19/16 0753  Gross per 24 hour  Intake              480 ml  Output             1180 ml  Net             -700 ml    Assessment:  75 y.o. male is s/p:  successful thrombolysis of LLE bypass graft by IR    Plan: -pt continues to have easily palpable left PT pulse. -INR is therapeutic today-INR jumped significantly from yesterday to 2.07 from 1.6.  Most likely will hold coumadin today as he will most likely jump again tomorrow.  Resume normal dose tomorrow. -DVT prophylaxis:  Heparin/coumadin -d/c heparin-hgb 7.5 today and has dropped over the past couple of days.  Will d/w Dr.  Edilia Bo -will give pt enough pain medication to Monday so that he can f/u at the [facility] for further pain management.  Doreatha Massed, PA-C [facility] [phone] 06/19/2016 9:03 AM  I have interviewed the patient and examined the patient. I agree with the findings by the PA. Coumadin is therapeutic.  Home today.  F/U with [facility] 3-4 weeks.  Cari Caraway, MD [phone]

## 2016-06-19 NOTE — Discharge Summary (Signed)
Discharge Summary    [name] [birth year] 75 y.o. male  [id number]  Admission Date: 06/12/2016  Discharge Date: 06/19/16  Physician: Sherren Kerns, MD  Admission Diagnosis: Lower limb ischemia [I99.8] Femoral-tibial bypass graft occlusion, left [facility]) [T82.398A]   HPI:   This is a 75 y.o. male with cold left foot and calf cramping since yesterday at 3pm.  He was told to come to the ED this am by Dr. Darrick Penna.  He has also undergone a femoral to posterior tibial bypass graft with arm vein in 2007 by Dr. Hart Rochester. He has had a history of right brain stroke. He suffers from atrial fibrillation and is on Coumadin. He is a diabetic. He has recently changed medications and states that his blood sugars are under much better control, around 100. He is medically managed for hypercholesterolemia with a statin. He is on multiple blood pressure medications. He takes Pletal for peripheral vascular disease. He is a smoker. He reports a significant weight loss.  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 Myra Gianotti reported by Dr. Tyrone Sage.    Of note pt has had recent CT which showed multiple chest nodules, possible mass, dilated bile ducts, wt loss and work up was in progress for this.   Hospital Course:  The patient was admitted to the hospital on 06/12/16 and was started on a heparin gtt.  On 06/13/16, he was taken to interventional radiology where he underwent: Successful thrombolysis of LLE bypass graft.      His left groin did have a hematoma from the groin stick and he has ecchymosis on his left thigh.    Throughout his hosptial stay, he has been on heparin and coumadin.  He has received 6 doses of coumadin 6mg  and one dose of 7.5mg .  His INR jumped from 1.67 on 8/4 to 2.07 on 8/5.  Will hold his dose of coumadin on 8/5 and resume normal dose of 4mg  on  Sunday, 8/6.  He has also had decreasing hgb that is 7.5 at discharge.  He is tolerating it and is asymptomatic.    We will have him f/u on Monday, 8/7 with his PCP for INR and CBC check.  His INR needs to remain therapeutic > 2.    He has continued to have an easily palpable left PT pulse after thrombolysis.    He was on Levaquin at admission for PNA and this was discontinued on 8/2.   The remainder of the hospital course consisted of increasing mobilization and increasing intake of solids without difficulty.  CBC    Component Value Date/Time   WBC 6.3 06/19/2016 0227   RBC 2.46 (L) 06/19/2016 0227   HGB 7.5 (L) 06/19/2016 0227   HCT 22.2 (L) 06/19/2016 0227   PLT 120 (L) 06/19/2016 0227   MCV 90.2 06/19/2016 0227   MCH 30.5 06/19/2016 0227   MCHC 33.8 06/19/2016 0227   RDW 16.2 (H) 06/19/2016 0227   LYMPHSABS 2.1 06/12/2016 1055   MONOABS 0.9 06/12/2016 1055   EOSABS 0.3 06/12/2016 1055   BASOSABS 0.0 06/12/2016 1055    BMET    Component Value Date/Time   NA 139 06/18/2016 0614   K 3.7 06/18/2016 0614   CL 107 06/18/2016 0614   CO2 27 06/18/2016 0614   GLUCOSE 147 (H) 06/18/2016 0614   BUN 14 06/18/2016 0614   CREATININE 1.31 (  H) 06/18/2016 9562   CALCIUM 8.5 (L) 06/18/2016 1308   GFRNONAA 52 (L) 06/18/2016 0614   GFRAA 60 (L) 06/18/2016 6578      Discharge Instructions    Call MD for:  redness, tenderness, or signs of infection (pain, swelling, bleeding, redness, odor or green/yellow discharge around incision site)    Complete by:  As directed   Call MD for:  severe or increased pain, loss or decreased feeling  in affected limb(s)    Complete by:  As directed   Call MD for:  temperature >100.5    Complete by:  As directed   Discharge instructions    Complete by:  As directed   Do not take any coumadin today (8/5) and resume normal dose tomorrow (8/6).  Have your INR and CBC checked on Monday 8/7 at your primary care doctor.  You have been given a prescription for  Oxycodone for 3 days.  Please also follow up with your PCP for further evaluation & refills.   Driving Restrictions    Complete by:  As directed   No driving for 2 weeks & while taking pain medication.   Lifting restrictions    Complete by:  As directed   No lifting for 2 weeks   Resume previous diet    Complete by:  As directed      Discharge Diagnosis:  Lower limb ischemia [I99.8] Femoral-tibial bypass graft occlusion, left (HCC) [T82.398A]  Secondary Diagnosis: Patient Active Problem List   Diagnosis Date Noted  . Malnutrition of moderate degree 06/15/2016  . PAD (peripheral artery disease) (HCC) 06/12/2016  . CVA (cerebral vascular accident) February 2016 01/03/2015  . Essential hypertension 01/03/2015  . Weight loss 08/08/2014  . Aftercare following surgery of the circulatory system, NEC 03/19/2014  . DM2 (diabetes mellitus, type 2) (HCC) 12/11/2013  . COPD (chronic obstructive pulmonary disease) (HCC) 12/11/2013  . PVD (peripheral vascular disease) (HCC) 12/11/2013  . Barrett's esophagus 12/11/2013  . Occlusion and stenosis of carotid artery without mention of cerebral infarction 05/21/2013  . Anticoagulated on Coumadin 05/03/2013  . Other and unspecified hyperlipidemia 05/03/2013  . Paroxysmal atrial fibrillation (HCC) 05/03/2013  . Benign hypertensive heart disease without heart failure 05/03/2013  . 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 AAA (abdominal aortic aneurysm) (HCC)   . Anemia   . Anxiety   . Arthritis    Gout  . Atrial fibrillation (HCC)   . Barrett esophagus   . Carotid artery occlusion    Carotid endarterectomy 2002 and 2004  . COPD (chronic obstructive pulmonary disease) (HCC)   . Diabetes mellitus   . Diverticulosis   . DVT (deep venous thrombosis) (HCC)   . Erectile dysfunction   . GERD (gastroesophageal reflux disease)   . Gout   . Hiatal hernia   . History  of stress test    March 2012 post stress ejection fraction 56%, negative for ischemia.  Last 2-D echocardiogram March 2012 younger than 55% mild concentric left ventricular hypertrophy grade 1 diastolic dysfunction. Mildly dilated left atrium. 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  . Hyperlipidemia   . Hypertension   . Lumbar stenosis   . PAD (peripheral artery disease) (HCC)    Fem-Fem by-pass graft 1991. redo patch angioplasty in 2000 and 2004.  LEA dopplers: 04/2012- R-ABI 1.34, L-ABI 0.97, patent Left lower extremity graft.  . Pancreatitis   . Stroke [facility]) March 2016  Lihgt Stroke       Medication List    TAKE these medications   ALPRAZolam 0.5 MG tablet Commonly known as:  XANAX Take 0.25 mg by mouth at bedtime as needed for anxiety.   amLODipine 5 MG tablet Commonly known as:  NORVASC Take 5 mg by mouth daily.   aspirin 81 MG tablet Take 81 mg by mouth daily.   atorvastatin 80 MG tablet Commonly known as:  LIPITOR Take 40 mg by mouth.   cilostazol 100 MG tablet Commonly known as:  PLETAL Take 100 mg by mouth 2 (two) times daily.   cloNIDine 0.1 MG tablet Commonly known as:  CATAPRES Take 0.1 mg by mouth 3 (three) times daily.   cloNIDine 0.3 MG tablet Commonly known as:  CATAPRES Take 0.1 mg by mouth 3 (three) times daily.   COUMADIN PO Take 4 mg by mouth daily. As directed   docusate sodium 100 MG capsule Commonly known as:  COLACE Take 200 mg by mouth 2 (two) times daily.   FERROCITE F PO Take by mouth daily.   glipiZIDE 5 MG tablet Commonly known as:  GLUCOTROL Take 5 mg by mouth 2 (two) times daily before a meal.   levofloxacin 500 MG tablet Commonly known as:  LEVAQUIN Take 1 tablet (500 mg total) by mouth daily.   losartan 100 MG tablet Commonly known as:  COZAAR Take 100 mg by mouth daily.   LOVAZA 1 g capsule Generic drug:  omega-3 acid ethyl esters Take by mouth 2 (two) times daily. Takes 2 tablets   metoprolol tartrate 25 MG  tablet Commonly known as:  LOPRESSOR TAKE 1 TABLET BY MOUTH TWICE DAILY   Oxycodone HCl 20 MG Tabs Take 1 tablet (20 mg total) by mouth every 8 (eight) hours as needed for moderate pain. What changed:  medication strength  how much to take  reasons to take this  Another medication with the same name was removed. Continue taking this medication, and follow the directions you see here.   pantoprazole 40 MG tablet Commonly known as:  PROTONIX Take 40 mg by mouth 2 (two) times daily.   propafenone 225 MG tablet Commonly known as:  RYTHMOL Take 225 mg by mouth 2 (two) times daily.   propafenone 225 MG tablet Commonly known as:  RYTHMOL TAKE 1 TABLET BY MOUTH EVERY 8 HOURS   saxagliptin HCl 2.5 MG Tabs tablet Commonly known as:  ONGLYZA Take 2.5 mg by mouth daily.   testosterone cypionate 200 MG/ML injection Commonly known as:  DEPOTESTOSTERONE CYPIONATE Inject 100 mg into the skin every 14 (fourteen) days.   Vitamin D (Ergocalciferol) 50000 units Caps capsule Commonly known as:  DRISDOL Take 50,000 Units by mouth every 7 (seven) days.       Prescriptions given: Percocet 20mg  q8h #10 No Refill (pt given enough pain medication to get through Monday at discharge).  Pain evaluation and pain management for refills per PCP or [facility].   Disposition: home  Instructions:  Do not take any coumadin today (8/5) and resume normal dose tomorrow (8/6).  Have your INR and CBC checked on Monday 8/7 at your primary care doctor.  You have been given a prescription for Oxycodone for 3 days.  Please also follow up with your PCP for further evaluation & refills.  Discussed this with the pt and he understands and is in agreement.  Patient's condition: is Good  Follow up: 1. Dr. Myra Gianotti in 3 weeks 2. Johna Sheriff, PA-C 06/21/16  for INR (goal is 2.0-2.5) and CBC check as well as pain management evaluation for narcotic refill.     Doreatha Massed, PA-C [facility] [phone] 06/19/2016  9:28 AM

## 2016-06-20 NOTE — Progress Notes (Signed)
Discharged to home with family office visits in place teaching done  

## 2016-06-21 ENCOUNTER — Encounter (HOSPITAL_COMMUNITY): Payer: Medicare Other

## 2016-06-21 NOTE — Telephone Encounter (Signed)
Ok to hold coumadin and pletal for surgery

## 2016-06-22 ENCOUNTER — Telehealth: Payer: Self-pay | Admitting: Surgery

## 2016-06-22 ENCOUNTER — Ambulatory Visit: Payer: Medicare Other | Admitting: Family

## 2016-06-22 ENCOUNTER — Encounter (HOSPITAL_COMMUNITY): Payer: Medicare Other

## 2016-06-22 NOTE — Telephone Encounter (Signed)
Sched appt 8/28 at 8:30. Spoke w/ pt's wife to inform them of appt.

## 2016-06-22 NOTE — Telephone Encounter (Signed)
Phillips Odorarol S Pullins, RN  P [facility] Admin Pool      Previous Messages    ----- Message -----  From: Sherren Kernsharles E Fields, MD  Sent: 06/21/2016  7:52 AM  To: [facility] Charge Pool   This patient should actually follow up with Dr Myra GianottiBrabham in 3 weeks rather than me. He is being followed by Dr Myra GianottiBrabham for his thoracic aneurysm.   Fabienne Brunsharles Fields

## 2016-06-22 NOTE — Telephone Encounter (Signed)
Communicated w/ Judeth CornfieldStephanie at [facility] regarding epic note.

## 2016-06-22 NOTE — Telephone Encounter (Signed)
-----   Message from Phillips Odorarol S Pullins, RN sent at 06/21/2016  4:39 PM EDT ----- Regarding: needs f/u with Dr. Darrick PennaFields in 3-4 weeks/ s/p Thrombolysis LLE BP on 7/30 in IR    ----- Message ----- From: Dara LordsSamantha J Rhyne, PA-C Sent: 06/19/2016   9:24 AM To: [facility] Charge Pool  S/p thrombolysis LLE bypass.  F/u with [facility] in 3-4 weeks.  Pt is instructed to go to PCP Loraine Leriche(Mark Helper, PA-C) on Monday 8/7 for INR/CBC check.  Could you f/u and make sure he goes to get his blood work.  I put this on his d/c papers and discussed with him about it  Thanks, Lelon MastSamantha

## 2016-07-05 ENCOUNTER — Ambulatory Visit (HOSPITAL_BASED_OUTPATIENT_CLINIC_OR_DEPARTMENT_OTHER)
Admission: RE | Admit: 2016-07-05 | Discharge: 2016-07-05 | Disposition: A | Discharge status: Home / Self Care | Payer: Medicare Other | Source: Ambulatory Visit | Attending: Pulmonary Disease | Admitting: Pulmonary Disease

## 2016-07-05 ENCOUNTER — Telehealth: Payer: Self-pay

## 2016-07-05 DIAGNOSIS — Z720 Tobacco use: Secondary | ICD-10-CM | POA: Diagnosis present

## 2016-07-05 DIAGNOSIS — J479 Bronchiectasis, uncomplicated: Secondary | ICD-10-CM | POA: Diagnosis not present

## 2016-07-05 DIAGNOSIS — F17200 Nicotine dependence, unspecified, uncomplicated: Secondary | ICD-10-CM

## 2016-07-05 DIAGNOSIS — I700 Atherosclerosis of aorta: Secondary | ICD-10-CM | POA: Insufficient documentation

## 2016-07-05 DIAGNOSIS — I2510 Atherosclerotic heart disease of native coronary artery without angina pectoris: Secondary | ICD-10-CM | POA: Insufficient documentation

## 2016-07-05 DIAGNOSIS — J439 Emphysema, unspecified: Secondary | ICD-10-CM | POA: Diagnosis not present

## 2016-07-05 DIAGNOSIS — R918 Other nonspecific abnormal finding of lung field: Secondary | ICD-10-CM | POA: Diagnosis not present

## 2016-07-05 DIAGNOSIS — I712 Thoracic aortic aneurysm, without rupture: Secondary | ICD-10-CM | POA: Insufficient documentation

## 2016-07-05 NOTE — Telephone Encounter (Signed)
Dr. Isaiah SergeMannam was paged and no response. Will forward to DOD. CT report in epic.  Please advise RB thanks

## 2016-07-05 NOTE — Telephone Encounter (Signed)
LMTCB x 1 

## 2016-07-05 NOTE — Telephone Encounter (Signed)
Call [facility]- states that CT chest w/o contrast ordered by PM has been read and is available in Epic- did not specify any notations for call report.  PM please advise on recs.  Thanks!

## 2016-07-05 NOTE — Telephone Encounter (Signed)
Please the patient know that his lower lobe inflammatory changes seen on his CAT scan from June have improved some compared with that study. Also let him know that his previously identified thoracic aortic aneurysm is slightly enlarged compared with his most recent study. He needs to follow-up with vascular surgery regarding this new finding to determine whether there is any other intervention that should be made.

## 2016-07-06 NOTE — Telephone Encounter (Signed)
atc pt, vm full.  Wcb.  

## 2016-07-07 ENCOUNTER — Encounter: Payer: Self-pay | Admitting: Surgery

## 2016-07-07 NOTE — Telephone Encounter (Signed)
Spoke with the pt and his spouse and notified of results and recs per RB  He has ov with Dr. Manson PasseyBrown on 07/12/16 and will keep this appt

## 2016-07-12 ENCOUNTER — Encounter: Payer: Self-pay | Admitting: Surgery

## 2016-07-12 ENCOUNTER — Ambulatory Visit (HOSPITAL_COMMUNITY)
Admission: RE | Admit: 2016-07-12 | Discharge: 2016-07-12 | Disposition: A | Discharge status: Home / Self Care | Payer: Medicare Other | Source: Ambulatory Visit | Attending: Pulmonary Disease | Admitting: Pulmonary Disease

## 2016-07-12 ENCOUNTER — Ambulatory Visit: Payer: Medicare Other | Admitting: Pulmonary Disease

## 2016-07-12 ENCOUNTER — Ambulatory Visit (INDEPENDENT_AMBULATORY_CARE_PROVIDER_SITE_OTHER): Payer: Medicare Other | Admitting: Surgery

## 2016-07-12 VITALS — BP 110/53 | HR 77 | Temp 97.7°F | Resp 12 | Ht 68.0 in | Wt 136.0 lb

## 2016-07-12 DIAGNOSIS — J449 Chronic obstructive pulmonary disease, unspecified: Secondary | ICD-10-CM | POA: Insufficient documentation

## 2016-07-12 DIAGNOSIS — I712 Thoracic aortic aneurysm, without rupture, unspecified: Secondary | ICD-10-CM

## 2016-07-12 DIAGNOSIS — I779 Disorder of arteries and arterioles, unspecified: Secondary | ICD-10-CM | POA: Diagnosis not present

## 2016-07-12 LAB — PULMONARY FUNCTION TEST
DL/VA % PRED: 34 %
DL/VA: 1.53 ml/min/mmHg/L
DLCO UNC % PRED: 31 %
DLCO UNC: 9.2 ml/min/mmHg
FEF 25-75 POST: 0.82 L/s
FEF 25-75 PRE: 0.97 L/s
FEF2575-%Change-Post: -15 %
FEF2575-%PRED-PRE: 48 %
FEF2575-%Pred-Post: 40 %
FEV1-%CHANGE-POST: 13 %
FEV1-%PRED-PRE: 53 %
FEV1-%Pred-Post: 60 %
FEV1-Post: 1.67 L
FEV1-Pre: 1.48 L
FEV1FVC-%CHANGE-POST: 9 %
FEV1FVC-%PRED-PRE: 58 %
FEV6-%Change-Post: 0 %
FEV6-%PRED-PRE: 97 %
FEV6-%Pred-Post: 97 %
FEV6-Post: 3.54 L
FEV6-Pre: 3.52 L
FEV6FVC-%CHANGE-POST: 0 %
FEV6FVC-%PRED-POST: 105 %
FEV6FVC-%Pred-Pre: 106 %
FVC-%Change-Post: 3 %
FVC-%PRED-PRE: 91 %
FVC-%Pred-Post: 94 %
FVC-PRE: 3.53 L
FVC-Post: 3.65 L
PRE FEV1/FVC RATIO: 42 %
PRE FEV6/FVC RATIO: 100 %
Post FEV1/FVC ratio: 46 %
Post FEV6/FVC ratio: 99 %
RV % pred: 179 %
RV: 4.41 L
TLC % PRED: 121 %
TLC: 8.04 L

## 2016-07-12 MED ORDER — ALBUTEROL SULFATE (2.5 MG/3ML) 0.083% IN NEBU
2.5000 mg | INHALATION_SOLUTION | Freq: Once | RESPIRATORY_TRACT | Status: AC
  Administered 2016-07-12: 2.5 mg via RESPIRATORY_TRACT

## 2016-07-12 NOTE — Progress Notes (Signed)
[facility]  Patient name: [name] Prunty MRN: [id number] DOB: Oct 24, 1941 Sex: male  REASON FOR VISIT: follow up  HPI: [name] Abraha is a 75 y.o. male is a patient I have been evaluating with Dr. Tyrone SageGerhardt for thoracic stent graft to treat a descending thoracic aortic aneurysm.  He has a history of a left femoral to posterior tibial bypass graft with arm vein by Dr. Hart RochesterLawson.  After I saw him in the office, he presented to the hospital with an occluded bypass graft.  He underwent successful thrombus lysis.  The patient is currently undergoing a pulmonary workup for clearance for his surgery.  Currently, he is scheduled to get pulmonary function studies today.  He had a repeat CT scan which shows the pulmonary process is decreasing, suggesting this is inflammatory versus infectious rather than neoplastic.  On his CT scan, the aneurysm has increased in size from 5.5-5.7.  Past Medical History:  Diagnosis Date  . AAA (abdominal aortic aneurysm) (HCC)   . Anemia   . Anxiety   . Arthritis    Gout  . Atrial fibrillation (HCC)   . Barrett esophagus   . Carotid artery occlusion    Carotid endarterectomy 2002 and 2004  . COPD (chronic obstructive pulmonary disease) (HCC)   . Diabetes mellitus   . Diverticulosis   . DVT (deep venous thrombosis) (HCC)   . Erectile dysfunction   . GERD (gastroesophageal reflux disease)   . Gout   . Hiatal hernia   . History of stress test    March 2012 post stress ejection fraction 56%, negative for ischemia.  Last 2-D echocardiogram March 2012 younger than 55% mild concentric left ventricular hypertrophy grade 1 diastolic dysfunction. Mildly dilated left atrium. 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  . Hyperlipidemia   . Hypertension   . Lumbar stenosis   . PAD (peripheral artery disease) (HCC)    Fem-Fem by-pass graft 1991. redo patch angioplasty in 2000 and 2004.  LEA dopplers: 04/2012- R-ABI 1.34,  [name]-ABI 0.97, patent Left lower extremity graft.  . Pancreatitis   . Stroke [facility](HCC) March 2016   Lihgt Stroke    Family History  Problem Relation Age of Onset  . Heart disease Father   . Heart attack Father     X's 3  . Hyperlipidemia Father   . Hypertension Father   . Diabetes Brother   . Hypertension Brother   . Cancer Sister     type unknown    SOCIAL HISTORY: Social History  Substance Use Topics  . Smoking status: Light Tobacco Smoker    Packs/day: 0.33    Years: 60.00    Types: Cigarettes, E-cigarettes  . Smokeless tobacco: Former NeurosurgeonUser    Types: Chew    Quit date: 05/21/1968  . Alcohol use No    Allergies  Allergen Reactions  . Lopressor [Metoprolol Tartrate] Palpitations    LOPRESSOR bothers the patient.  . Niacin     Other reaction(s): Facial Edema (intolerance)    Current Outpatient Prescriptions  Medication Sig Dispense Refill  . ALPRAZolam (XANAX) 0.5 MG tablet Take 0.25 mg by mouth at bedtime as needed for anxiety.     Marland Kitchen. amLODipine (NORVASC) 5 MG tablet Take 5 mg by mouth daily.     Marland Kitchen. aspirin 81 MG tablet Take 81 mg by mouth daily.    Marland Kitchen. atorvastatin (LIPITOR) 80 MG tablet Take 40 mg by mouth.     . cilostazol (PLETAL) 100 MG tablet Take  100 mg by mouth 2 (two) times daily.    . cloNIDine (CATAPRES) 0.1 MG tablet Take 0.1 mg by mouth 3 (three) times daily.    . cloNIDine (CATAPRES) 0.3 MG tablet Take 0.1 mg by mouth 3 (three) times daily.   6  . docusate sodium (COLACE) 100 MG capsule Take 200 mg by mouth 2 (two) times daily.     . Ferrous Fumarate-Folic Acid (FERROCITE F PO) Take by mouth daily.    Marland Kitchen glipiZIDE (GLUCOTROL) 5 MG tablet Take 5 mg by mouth 2 (two) times daily before a meal.    . losartan (COZAAR) 100 MG tablet Take 100 mg by mouth daily.    . metoprolol tartrate (LOPRESSOR) 25 MG tablet TAKE 1 TABLET BY MOUTH TWICE DAILY 180 tablet 0  . omega-3 acid ethyl esters (LOVAZA) 1 G capsule Take by mouth 2 (two) times daily. Takes 2 tablets    .  oxyCODONE 20 MG TABS Take 1 tablet (20 mg total) by mouth every 8 (eight) hours as needed for moderate pain. 10 tablet 0  . pantoprazole (PROTONIX) 40 MG tablet Take 40 mg by mouth 2 (two) times daily.     . propafenone (RYTHMOL) 225 MG tablet TAKE 1 TABLET BY MOUTH EVERY 8 HOURS 90 tablet 1  . propafenone (RYTHMOL) 225 MG tablet Take 225 mg by mouth 2 (two) times daily.    . saxagliptin HCl (ONGLYZA) 2.5 MG TABS tablet Take 2.5 mg by mouth daily.    Marland Kitchen testosterone cypionate (DEPOTESTOTERONE CYPIONATE) 200 MG/ML injection Inject 100 mg into the skin every 14 (fourteen) days.     . Vitamin D, Ergocalciferol, (DRISDOL) 50000 UNITS CAPS capsule Take 50,000 Units by mouth every 7 (seven) days.    . Warfarin Sodium (COUMADIN PO) Take 4 mg by mouth daily. As directed     No current facility-administered medications for this visit.     REVIEW OF SYSTEMS:  [X]  denotes positive finding, [ ]  denotes negative finding Cardiac  Comments:  Chest pain or chest pressure:    Shortness of breath upon exertion:    Short of breath when lying flat:    Irregular heart rhythm:        Vascular    Pain in calf, thigh, or hip brought on by ambulation:    Pain in feet at night that wakes you up from your sleep:     Blood clot in your veins:    Leg swelling:         Pulmonary    Oxygen at home:    Productive cough:     Wheezing:         Neurologic    Sudden weakness in arms or legs:     Sudden numbness in arms or legs:     Sudden onset of difficulty speaking or slurred speech:    Temporary loss of vision in one eye:     Problems with dizziness:         Gastrointestinal    Blood in stool:     Vomited blood:         Genitourinary    Burning when urinating:     Blood in urine:        Psychiatric    Major depression:         Hematologic    Bleeding problems:    Problems with blood clotting too easily:        Skin    Rashes or ulcers:  Constitutional    Fever or chills:      PHYSICAL  EXAM: Vitals:   07/12/16 0820  BP: (!) 110/53  Pulse: 77  Resp: 12  Temp: 97.7 F (36.5 C)  TempSrc: Oral  SpO2: 98%  Weight: 136 lb (61.7 kg)  Height: 5\' 8"  (1.727 m)    GENERAL: The patient is a well-nourished male, in no acute distress. The vital signs are documented above. CARDIAC: There is a regular rate and rhythm.  VASCULAR: Palpable pulse within left femoral posterior tibial bypass graft PULMONARY: There is good air exchange bilaterally without wheezing or rales. ABDOMEN: Soft and non-tender with normal pitched bowel sounds.  MUSCULOSKELETAL: There are no major deformities or cyanosis. NEUROLOGIC: No focal weakness or paresthesias are detected. SKIN: There are no ulcers or rashes noted. PSYCHIATRIC: The patient has a normal affect.  DATA:  I have reviewed his CT scan which shows a slight enlargement of the descending thoracic aortic aneurysm, measuring 5.7 cm  MEDICAL ISSUES: Descending thoracic aortic aneurysm: I discussed proceeding with endovascular repair via a right retroperitoneal common iliac artery exposure.  The patient has severely calcified vessels and access is going to be his biggest issue.  We again discussed the risks and benefits of the operation including but not limited to arterial injury, bleeding, paralysis, stroke.  He understands this.  We will proceed once he obtains pulmonary clearance.  I am also sending him to see Dr. Tresa Endo, his cardiologist to make sure he is cleared from a cardiac standpoint.  He will need to be off of his Coumadin.  I will plan on bridging him with Lovenox.  We will schedule his surgery once he is cleared medically.  He is scheduled to see Dr. Tyrone Sage later this week.    Durene Cal, MD [facility] [phone] Pager [phone]

## 2016-07-13 ENCOUNTER — Telehealth: Payer: Self-pay | Admitting: Internal Medicine

## 2016-07-13 ENCOUNTER — Encounter: Payer: Self-pay | Admitting: Pulmonary Disease

## 2016-07-13 ENCOUNTER — Other Ambulatory Visit: Payer: Self-pay | Admitting: Surgery

## 2016-07-13 ENCOUNTER — Telehealth: Payer: Self-pay | Admitting: Surgery

## 2016-07-13 ENCOUNTER — Ambulatory Visit (INDEPENDENT_AMBULATORY_CARE_PROVIDER_SITE_OTHER): Payer: Medicare Other | Admitting: Pulmonary Disease

## 2016-07-13 ENCOUNTER — Encounter: Payer: Self-pay | Admitting: Family

## 2016-07-13 VITALS — BP 126/68 | HR 65 | Ht 68.0 in | Wt 136.0 lb

## 2016-07-13 DIAGNOSIS — I779 Disorder of arteries and arterioles, unspecified: Secondary | ICD-10-CM

## 2016-07-13 DIAGNOSIS — J441 Chronic obstructive pulmonary disease with (acute) exacerbation: Secondary | ICD-10-CM

## 2016-07-13 DIAGNOSIS — Z720 Tobacco use: Secondary | ICD-10-CM | POA: Diagnosis not present

## 2016-07-13 DIAGNOSIS — F17200 Nicotine dependence, unspecified, uncomplicated: Secondary | ICD-10-CM

## 2016-07-13 DIAGNOSIS — J449 Chronic obstructive pulmonary disease, unspecified: Secondary | ICD-10-CM

## 2016-07-13 MED ORDER — FLUTTER DEVI: 0 refills | Status: AC

## 2016-07-13 MED ORDER — MOMETASONE FURO-FORMOTEROL FUM 200-5 MCG/ACT IN AERO: 2.0000 | INHALATION_SPRAY | Freq: Two times a day (BID) | RESPIRATORY_TRACT | 5 refills | Status: AC

## 2016-07-13 NOTE — Telephone Encounter (Signed)
error 

## 2016-07-13 NOTE — Addendum Note (Signed)
Addended by: Boone MasterJONES, JESSICA E on: 07/13/2016 05:23 PM   Modules accepted: Orders

## 2016-07-13 NOTE — Progress Notes (Signed)
[name]    [id number]    1941/08/16  Primary Care Physician:HEPLER,MARK, PA-C  Referring Physician: Lovenia Kim, PA-C [address] [place], [place] [address]  Chief complaint:  Follow up for abnormal CT scan.   HPI: [name] is a 75 year old heavy active smoker with past medical history as below. He was evaluated for weight loss, loss of appetite. He had a CT of the abdomen which showed cholelithiasis, bile duct dilation for which she is being evaluated by Dr. [name], GI. The CT also showed right basilar consolidation, opacities. She was treated with a course of Augmentin.  He got a dedicated CT of the chest which showed slightly worse opacities. His been referred to our clinic for further evaluation. The CT scan chest is also significant for significant coronary atherosclerosis and thoracic aortic aneurysm. He was treated with 2 weeks of Levaquin and a subsequent CT scan shows improvement in opacities. He was hospitalized for occlusion of posterior tibial bypass graft, underwent successful thrombolysis  He had chronic cough with sputum production. Denies dyspnea on exertion, wheezing. He denies any hemoptysis, fevers, chills. He is a heavy ex-smoker and is trying to quit.   Outpatient Encounter Prescriptions as of 07/13/2016  Medication Sig  . ALPRAZolam (XANAX) 0.5 MG tablet Take 0.25 mg by mouth at bedtime as needed for anxiety.   Marland Kitchen amLODipine (NORVASC) 5 MG tablet Take 5 mg by mouth daily.   Marland Kitchen aspirin 81 MG tablet Take 81 mg by mouth daily.  Marland Kitchen atorvastatin (LIPITOR) 80 MG tablet Take 40 mg by mouth.   . cilostazol (PLETAL) 100 MG tablet Take 100 mg by mouth 2 (two) times daily.  . cloNIDine (CATAPRES) 0.1 MG tablet Take 0.1 mg by mouth 3 (three) times daily.  . cloNIDine (CATAPRES) 0.3 MG tablet Take 0.1 mg by mouth 3 (three) times daily.   Marland Kitchen docusate sodium (COLACE) 100 MG capsule Take 200 mg by mouth 2 (two) times daily.   . Ferrous Fumarate-Folic Acid  (FERROCITE F PO) Take by mouth daily.  Marland Kitchen glipiZIDE (GLUCOTROL) 5 MG tablet Take 5 mg by mouth 2 (two) times daily before a meal.  . losartan (COZAAR) 100 MG tablet Take 100 mg by mouth daily.  . metoprolol tartrate (LOPRESSOR) 25 MG tablet TAKE 1 TABLET BY MOUTH TWICE DAILY  . omega-3 acid ethyl esters (LOVAZA) 1 G capsule Take by mouth 2 (two) times daily. Takes 2 tablets  . oxyCODONE 20 MG TABS Take 1 tablet (20 mg total) by mouth every 8 (eight) hours as needed for moderate pain.  . pantoprazole (PROTONIX) 40 MG tablet Take 40 mg by mouth 2 (two) times daily.   . propafenone (RYTHMOL) 225 MG tablet Take 225 mg by mouth 2 (two) times daily.  . saxagliptin HCl (ONGLYZA) 2.5 MG TABS tablet Take 2.5 mg by mouth daily.  Marland Kitchen testosterone cypionate (DEPOTESTOTERONE CYPIONATE) 200 MG/ML injection Inject 100 mg into the skin every 14 (fourteen) days.   . Vitamin D, Ergocalciferol, (DRISDOL) 50000 UNITS CAPS capsule Take 50,000 Units by mouth every 7 (seven) days.  . Warfarin Sodium (COUMADIN PO) Take 4 mg by mouth daily. As directed  . [DISCONTINUED] propafenone (RYTHMOL) 225 MG tablet TAKE 1 TABLET BY MOUTH EVERY 8 HOURS (Patient not taking: Reported on 07/13/2016)   No facility-administered encounter medications on file as of 07/13/2016.     Allergies as of 07/13/2016 - Review Complete 07/13/2016  Allergen Reaction Noted  .  Lopressor [metoprolol tartrate] Palpitations 04/20/2012  . Niacin  06/02/2016    Past Medical History:  Diagnosis Date  . AAA (abdominal aortic aneurysm) (HCC)   . Anemia   . Anxiety   . Arthritis    Gout  . Atrial fibrillation (HCC)   . Barrett esophagus   . Carotid artery occlusion    Carotid endarterectomy 2002 and 2004  . COPD (chronic obstructive pulmonary disease) (HCC)   . Diabetes mellitus   . Diverticulosis   . DVT (deep venous thrombosis) (HCC)   . Erectile dysfunction   . GERD (gastroesophageal reflux disease)   . Gout   . Hiatal hernia   . History of  stress test    March 2012 post stress ejection fraction 56%, negative for ischemia.  Last 2-D echocardiogram March 2012 younger than 55% mild concentric left ventricular hypertrophy grade 1 diastolic dysfunction. Mildly dilated left atrium. 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  . Hyperlipidemia   . Hypertension   . Lumbar stenosis   . PAD (peripheral artery disease) (HCC)    Fem-Fem by-pass graft 1991. redo patch angioplasty in 2000 and 2004.  LEA dopplers: 04/2012- R-ABI 1.34, L-ABI 0.97, patent Left lower extremity graft.  . Pancreatitis   . Stroke [facility](HCC) March 2016   [facility]gt Stroke    Past Surgical History:  Procedure Laterality Date  . CAROTID ENDARTERECTOMY  2002   Left CEA  . CAROTID ENDARTERECTOMY  2007   Left CEA redo  . CATARACT EXTRACTION Left 02-02-95  . CATARACT EXTRACTION Right 04-20-95  . IR GENERIC HISTORICAL  06/13/2016   IR INFUSION THROMBOL ARTERIAL INITIAL (MS) 06/13/2016 Richarda OverlieAdam Henn, MD [facility]  . IR GENERIC HISTORICAL  06/13/2016   IR ANGIOGRAM EXTREMITY LEFT 06/13/2016 Richarda OverlieAdam Henn, MD [facility]  . IR GENERIC HISTORICAL  06/13/2016   IR US GUIDE VASC ACCESS LEFT 06/13/2016 Richarda OverlieAdam Henn, MD [facility]  . IR GENERIC HISTORICAL  06/14/2016   IR THROMB F/U EVAL ART/VEN FINAL DAY (MS) 06/14/2016 Oley Balmaniel Hassell, MD [facility]  . LUMBAR DISC SURGERY  1980  . PR VEIN BYPASS GRAFT,AORTO-FEM-POP  2007   Left Fem-post. tibial  . PR VEIN BYPASS GRAFT,AORTO-FEM-POP  1999-2004   numerous revision Left Fem-pop    Family History  Problem Relation Age of Onset  . Heart disease Father   . Heart attack Father     X's 3  . Hyperlipidemia Father   . Hypertension Father   . Diabetes Brother   . Hypertension Brother   . Cancer Sister     type unknown    Social History   Social History  . Marital status: Married    Spouse name: N/A  . Number of children: 3  . Years of education: N/A   Occupational History  . disabled    Social History Main Topics  . Smoking status: Heavy  Tobacco Smoker    Packs/day: 0.50    Years: 60.00    Types: Cigarettes, E-cigarettes  . Smokeless tobacco: Former NeurosurgeonUser    Types: Chew    Quit date: 05/21/1968  . Alcohol use No  . Drug use: No  . Sexual activity: Not on file   Other Topics Concern  . Not on file   Social History Narrative   Married, lives with spouse   3 daughters   Education: 10th grade   Occupation: disabled from back pain.  Metal work/fabrication x1890yrs   Religion: [employer]   Caffeine: rare        Review  of systems: Review of Systems  Constitutional: Negative for fever and chills.  HENT: Negative.   Eyes: Negative for blurred vision.  Respiratory: as per HPI  Cardiovascular: Negative for chest pain and palpitations.  Gastrointestinal: Negative for vomiting, diarrhea, blood per rectum. Genitourinary: Negative for dysuria, urgency, frequency and hematuria.  Musculoskeletal: Negative for myalgias, back pain and joint pain.  Skin: Negative for itching and rash.  Neurological: Negative for dizziness, tremors, focal weakness, seizures and loss of consciousness.  Endo/Heme/Allergies: Negative for environmental allergies.  Psychiatric/Behavioral: Negative for depression, suicidal ideas and hallucinations.  All other systems reviewed and are negative.   Physical Exam: Blood pressure 126/68, pulse 65, height 5\' 8"  (1.727 m), weight 136 lb (61.7 kg), SpO2 99 %. Gen:      No acute distress HEENT:  EOMI, sclera anicteric Neck:     No masses; no thyromegaly Lungs:    Clear to auscultation bilaterally; normal respiratory effort CV:         Regular rate and rhythm; no murmurs Abd:      + bowel sounds; soft, non-tender; no palpable masses, no distension Ext:    No edema; adequate peripheral perfusion Skin:      Warm and dry; no rash Neuro: alert and oriented x 3 Psych: normal mood and affect  Data Reviewed: CT abdomen 03/26/16 - cholelithiasis, choledocholithiasis, patchy lower lobe opacities. CT chest  04/26/16- progression of lower lobe opacities with new masslike consolidation in bilateral lower lobes.  CT chest 07/05/16- Decrease in lower lobe consolidations suggestive of infectious/inflammatory changes. 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 Thoracic aortic aneurysm 5.7 cm. Images reviewed.   PFTs 07/12/16 FVC 2.50 (91%) FEV1 1.48 (50%) F/F 42 TLC 121% RV/TLC 148% ERV 139%. DLCO 31% Moderately severe obstructive airway disease, hyperinflation Reduced diffusion capacity, air trapping Truncated flow volume loop, however this may be effort dependent.  Assessment:  #1 Consult for abnormal CT scan. The lower lobe opacities are consistent with an infectious/inflammatory process and have improved on repeat CT scan. He will need repeat CT scan in 6 months to follow these to resolution.   #2 COPD. PFTs reviewed today. They show moderate obstruction with severe reduction in diffusion capacity and hyperinflation. There is some improvement in FEV1 post bronchodilator. I think he will benefit from initiation of a bronchodilator such as Dulera even though he does not have any resp symptoms at present.  PFTs also show truncation of volume loop but this is likely due to variation in effort. The CT scan does not show any major intrathoracic obstruction. He does have inspissated secretions on LLL. I will give him a flutter valve for clearance.   He is being considered for endovascular repair of thoracic aneurysm. He is at low -intermediate risk for periop complication give his COPD, active smoking and aging.  He however is well optimized from resp stand point and is cleared for surgery. Suggest early mobilization, pain control post op, DVT prophylaxis  #2 Active smoker Encouraged to quit smoking.   Plan/Recommendations: - Cleared for endovascular repair of thoracic aneurysm - Start Dulera 200/50, flutter valve - Smoking cessation. - Post op early  mobilization, pain control post op, DVT prophylaxis  Chilton Greathouse MD [facility] Pager [phone] 07/13/2016, 9:09 AM  CC: Lovenia Kim, PA-C

## 2016-07-13 NOTE — Patient Instructions (Addendum)
You will be started on Dulera 200/50 twice a day. We will give you a flutter valve for clearance of secretions.  Your PFTs and CT scan were reviewed with you today. They'll consolidation on CT scan is better repeat imaging consisting an infectious, inflammatory process. We'll continue to follow this with a repeat CT scan later this year. The PFTs show moderate emphysema.  Your cleared from our standpoint to undergo repair of the thoracic aneurysm. He is strongly encouraged to quit smoking.  Return to clinic in 3 months.

## 2016-07-13 NOTE — Telephone Encounter (Signed)
I called patient and left a message regarding his appt w/ Dr.Kelly for pre-op cardiac clearance on Friday 07/23/16 at 3pm to arrive at 2:45pm. Their office address is [address] and ph# is [phone]/ awt

## 2016-07-14 ENCOUNTER — Ambulatory Visit: Payer: Medicare Other | Admitting: Adult Health

## 2016-07-15 ENCOUNTER — Encounter: Payer: Self-pay | Admitting: Cardiothoracic Surgery

## 2016-07-15 ENCOUNTER — Ambulatory Visit (INDEPENDENT_AMBULATORY_CARE_PROVIDER_SITE_OTHER): Payer: Medicare Other | Admitting: Cardiothoracic Surgery

## 2016-07-15 VITALS — BP 145/62 | HR 73 | Resp 20 | Ht 68.0 in | Wt 137.0 lb

## 2016-07-15 DIAGNOSIS — I712 Thoracic aortic aneurysm, without rupture, unspecified: Secondary | ICD-10-CM

## 2016-07-15 DIAGNOSIS — I779 Disorder of arteries and arterioles, unspecified: Secondary | ICD-10-CM | POA: Diagnosis not present

## 2016-07-15 NOTE — Progress Notes (Signed)
[address]       [place] [address]             [phone]                    TRAVEZ STANCIL [facility] Medical Record #[id number] Date of Birth: [birth year]  Referring: Lovenia Kim, PA-C Primary Care: Ethel Rana  Chief Complaint:    Chief Complaint  Patient presents with  . Thoracic Aortic Aneurysm    6 week f/u Chest CT 07/05/2016    History of Present Illness:    [name] 75 y.o. male is seen in the office for finding on chest CT of descending thoracic aneurysm . Patient has known severe peripheral vascular disease followed by  Dr. Hart Rochester for  his left lower extremity femoral to posterior tibial bypass which was done using composite bilateral cephalic vein grafts in 2007. He has had previous left carotid endarterectomy and redo left carotid endarterectomy most recent of which was 2007.  He denies any specific claudication symptoms but is able to walk one block and then stops because of fatigue. He has  is severe back discomfort which is chronic in nature.   He had a mild stroke in March 2016 while on coumadin as manifested by weakness in his left hand and foot, and drawing of the left corner of his mouth; these symptoms lasted about 2 weeks. He was evaluated by MRI of his head as requested by his PCP, per pt. He denies any subsequent strokes or TIA's.    He complained of cough and blood tinged sputum, also 50 lb wt loss over the past two years . CT of abdomen then ct of chest was done. 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 Patient referred to pulmonary to work up hemoptysis and  Bilateral lower lobe peribronchovascular reticular and airspace opacities with several airspace opacities appearing masslike.Since last seen the patient presented with acute thrombosis of his left femoral-tibial bypass requiring thrombolytic therapy to open.     Current Activity/ Functional Status:  Patient  is not independent with mobility/ambulation, transfers, ADL's, IADL's.   Zubrod Score: At the time of surgery this patient's most appropriate activity status/level should be described as: []     0    Normal activity, no symptoms []     1    Restricted in physical strenuous activity but ambulatory, able to do out light work []     2    Ambulatory and capable of self care, unable to do work activities, up and about               >50 % of waking hours                              []     3    Only limited self care, in bed greater than 50% of waking hours []     4    Completely disabled, no self care, confined to bed or chair []     5    Moribund   Past Medical History:  Diagnosis Date  . AAA (abdominal aortic aneurysm) (HCC)   . Anemia   . Anxiety   . Arthritis    Gout  . Atrial fibrillation (HCC)   . Barrett esophagus   . Carotid artery occlusion    Carotid endarterectomy 2002  and 2004  . COPD (chronic obstructive pulmonary disease) (HCC)   . Diabetes mellitus   . Diverticulosis   . DVT (deep venous thrombosis) (HCC)   . Erectile dysfunction   . GERD (gastroesophageal reflux disease)   . Gout   . Hiatal hernia   . History of stress test    March 2012 post stress ejection fraction 56%, negative for ischemia.  Last 2-D echocardiogram March 2012 younger than 55% mild concentric left ventricular hypertrophy grade 1 diastolic dysfunction. Mildly dilated left atrium. 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  . Hyperlipidemia   . Hypertension   . Lumbar stenosis   . PAD (peripheral artery disease) (HCC)    Fem-Fem by-pass graft 1991. redo patch angioplasty in 2000 and 2004.  LEA dopplers: 04/2012- R-ABI 1.34, L-ABI 0.97, patent Left lower extremity graft.  . Pancreatitis   . Stroke [facility]) March 2016   Lihgt Stroke    Past Surgical History:  Procedure Laterality Date  . CAROTID ENDARTERECTOMY  2002   Left CEA  . CAROTID ENDARTERECTOMY  2007   Left CEA redo  . CATARACT EXTRACTION Left 02-02-95    . CATARACT EXTRACTION Right 04-20-95  . IR GENERIC HISTORICAL  06/13/2016   IR INFUSION THROMBOL ARTERIAL INITIAL (MS) 06/13/2016 Richarda Overlie, MD [facility]  . IR GENERIC HISTORICAL  06/13/2016   IR ANGIOGRAM EXTREMITY LEFT 06/13/2016 Richarda Overlie, MD [facility]  . IR GENERIC HISTORICAL  06/13/2016   IR US GUIDE VASC ACCESS LEFT 06/13/2016 Richarda Overlie, MD [facility]  . IR GENERIC HISTORICAL  06/14/2016   IR THROMB F/U EVAL ART/VEN FINAL DAY (MS) 06/14/2016 Oley Balm, MD [facility]  . LUMBAR DISC SURGERY  1980  . PR VEIN BYPASS GRAFT,AORTO-FEM-POP  2007   Left Fem-post. tibial  . PR VEIN BYPASS GRAFT,AORTO-FEM-POP  1999-2004   numerous revision Left Fem-pop    Family History  Problem Relation Age of Onset  . Heart disease Father   . Heart attack Father     X's 3  . Hyperlipidemia Father   . Hypertension Father   . Diabetes Brother   . Hypertension Brother   . Cancer Sister     type unknown    Social History   Social History  . Marital status: Married    Spouse name: N/A  . Number of children: 3  . Years of education: N/A   Occupational History  . disabled    Social History Main Topics  . Smoking status: Heavy Tobacco Smoker    Packs/day: 0.50    Years: 60.00    Types: Cigarettes, E-cigarettes  . Smokeless tobacco: Former Neurosurgeon    Types: Chew    Quit date: 05/21/1968  . Alcohol use No  . Drug use: No  . Sexual activity: Not on file   Other Topics Concern  . Not on file   Social History Narrative   Married, lives with spouse   3 daughters   Education: 10th grade   Occupation: disabled from back pain.  Metal work/fabrication x64yrs   Religion: [employer]   Caffeine: rare       History  Smoking Status  . Heavy Tobacco Smoker  . Packs/day: 0.50  . Years: 60.00  . Types: Cigarettes, E-cigarettes  Smokeless Tobacco  . Former Neurosurgeon  . Types: Chew  . Quit date: 05/21/1968    History  Alcohol Use No     Allergies  Allergen Reactions  . Lopressor  [Metoprolol Tartrate] Palpitations    LOPRESSOR bothers  the patient.  . Niacin     Other reaction(s): Facial Edema (intolerance)    Current Outpatient Prescriptions  Medication Sig Dispense Refill  . ALPRAZolam (XANAX) 0.5 MG tablet Take 0.25 mg by mouth at bedtime as needed for anxiety.     Marland Kitchen amLODipine (NORVASC) 5 MG tablet Take 5 mg by mouth daily.     Marland Kitchen aspirin 81 MG tablet Take 81 mg by mouth daily.    Marland Kitchen atorvastatin (LIPITOR) 80 MG tablet Take 40 mg by mouth.     . cilostazol (PLETAL) 100 MG tablet Take 100 mg by mouth 2 (two) times daily.    . cloNIDine (CATAPRES) 0.3 MG tablet Take 0.1 mg by mouth 3 (three) times daily.   6  . docusate sodium (COLACE) 100 MG capsule Take 200 mg by mouth 2 (two) times daily.     . Ferrous Fumarate-Folic Acid (FERROCITE F PO) Take by mouth daily.    Marland Kitchen glipiZIDE (GLUCOTROL) 5 MG tablet Take 5 mg by mouth 2 (two) times daily before a meal.    . losartan (COZAAR) 100 MG tablet Take 100 mg by mouth daily.    . metoprolol tartrate (LOPRESSOR) 25 MG tablet TAKE 1 TABLET BY MOUTH TWICE DAILY 180 tablet 0  . mometasone-formoterol (DULERA) 200-5 MCG/ACT AERO Inhale 2 puffs into the lungs 2 (two) times daily. 1 Inhaler 5  . omega-3 acid ethyl esters (LOVAZA) 1 G capsule Take by mouth 2 (two) times daily. Takes 2 tablets    . oxyCODONE 20 MG TABS Take 1 tablet (20 mg total) by mouth every 8 (eight) hours as needed for moderate pain. 10 tablet 0  . pantoprazole (PROTONIX) 40 MG tablet Take 40 mg by mouth 2 (two) times daily.     . propafenone (RYTHMOL) 225 MG tablet Take 225 mg by mouth 2 (two) times daily.    Marland Kitchen Respiratory Therapy Supplies (FLUTTER) DEVI Use as directed. 1 each 0  . saxagliptin HCl (ONGLYZA) 2.5 MG TABS tablet Take 2.5 mg by mouth daily.    Marland Kitchen testosterone cypionate (DEPOTESTOTERONE CYPIONATE) 200 MG/ML injection Inject 100 mg into the skin every 14 (fourteen) days.     . Vitamin D, Ergocalciferol, (DRISDOL) 50000 UNITS CAPS capsule Take 50,000  Units by mouth every 7 (seven) days.    . Warfarin Sodium (COUMADIN PO) Take 4 mg by mouth daily. As directed     No current facility-administered medications for this visit.       Review of Systems:     Cardiac Review of Systems: Y or N  Chest Pain [    ]  Resting SOB [   ] Exertional SOB  [  ]  Orthopnea [  ]   Pedal Edema [   ]    Palpitations [  ] Syncope  [  ]   Presyncope [   ]  General Review of Systems: [Y] = yes [  ]=no Constitional: recent weight change Mahler.Beck  ];  Wt loss over the last 3 months [   ] anorexia Cove.Etienne  ]; fatigue [  y]; nausea [  ]; night sweats [  ]; fever [  ]; or chills [  ];          Dental: poor dentition[  ]; Last Dentist visit:   Eye : blurred vision [  ]; diplopia [   ]; vision changes [  ];  Amaurosis fugax[  ]; Resp: cough Cove.Etienne  ];  wheezing[y  ];  hemoptysis[y  ]; shortness of breath[ y ]; paroxysmal nocturnal dyspnea[ y ]; dyspnea on exertion[ y ]; or orthopnea[  ];  GI:  gallstones[  ], vomiting[  ];  dysphagia[  ]; melena[  ];  hematochezia [  ]; heartburn[  ];   Hx of  Colonoscopy[  ]; GU: kidney stones [ y ]; hematuria[  ];   dysuria [  ];  nocturia[  ];  history of     obstruction [  ]; urinary frequency [  ]             Skin: rash, swelling[  ];, hair loss[  ];  peripheral edema[  ];  or itching[  ]; Musculosketetal: myalgias[  ];  joint swelling[  ];  joint erythema[  ];  joint pain[  ];  back pain[  ];  Heme/Lymph: bruising[  ];  bleeding[  ];  anemia[  ];  Neuro: TIA[  ];  headaches[  ];  stroke[y  ];  vertigo[  ];  seizures[  ];   paresthesias[  ];  difficulty walking[ y ];  Psych:depression[  ]; anxiety[  ];  Endocrine: diabetes[ y ];  thyroid dysfunction[  ];  Immunizations: Flu up to date [ y ]; Pneumococcal up to date Cove.Etienne  ];  Other:  Physical Exam: BP (!) 145/62 (BP Location: Right Arm, Patient Position: Sitting, Cuff Size: Small)   Pulse 73   Resp 20   Ht 5\' 8"  (1.727 m)   Wt 137 lb (62.1 kg)   SpO2 98%   BMI 20.83 kg/m   PHYSICAL  EXAMINATION: General appearance: alert, cooperative, appears older than stated age and cachectic Head: Normocephalic, without obvious abnormality, atraumatic Neck: no adenopathy, bilaterial carotid bruits carotid bruit, no JVD, supple, symmetrical, trachea midline and thyroid not enlarged, symmetric, no tenderness/mass/nodules Lymph nodes: Cervical, supraclavicular, and axillary nodes normal. Resp: diminished breath sounds bibasilar Back: symmetric, no curvature. ROM normal. No CVA tenderness. Cardio: irregularly irregular rhythm GI: soft, non-tender; bowel sounds normal; no masses,  no organomegaly Extremities: extremities normal, atraumatic, no cyanosis or edema Neurologic: Grossly normal  Left pt pulse palpable , as noted aneurysm dilation of previous composite vein graft left leg, left dp and right pt/dp not palpable   Patient does have some bruising around the left femorotibial bypass related to his acute thrombosis of the graft and subsequent lysis done last month. Doesn't easily palpable pulse in the graft currently   Diagnostic Studies & Laboratory data:     Recent Radiology Findings:   Ct Chest Wo Contrast  04/26/2016  CLINICAL DATA:  Pt states current smoker x 60 years x 1ppd, pt denies cough/sob, wt loss close to 100lbs in a couple years EXAM: CT CHEST WITHOUT CONTRAST TECHNIQUE: Multidetector CT imaging of the chest was performed following the standard protocol without IV contrast. COMPARISON:  Chest radiograph, 04/15/2016. FINDINGS: Neck base and axilla: No mass or adenopathy. Dense atherosclerotic vascular calcifications. Mediastinum and hila: Heart is normal in size and configuration. There are dense coronary artery calcifications. There is small focal bulge along the left margin of the aortic arch just distal to the left subclavian artery takeoff. There is a more significant focal bulge of the descending thoracic aorta having a maximum diameter 5.5 cm, measuring 4 cm in length.  Dense atherosclerotic calcifications are noted throughout the thoracic aorta and along the aortic arch branch vessels extending into the upper abdominal arteries. There is an enlarged subcarinal lymph node measuring 14 mm in short  axis. No mediastinal or hilar masses or other enlarged lymph nodes. Lungs and pleura: There is reticular and patchy airspace peribronchovascular opacities in both lower lobes, left greater than right. Low attenuation material occludes the posterior, basal segment of the left lower lobe consistent with mucous/secretions. There are masslike areas of consolidation in the lower lobes, largest on the left measuring 3.1 x 2.1 x 2.3 cm. Largest on the right measures 2.1 x 1.1 x 2.0 cm. Lungs show additional areas of subtle ground-glass opacity and peripheral reticular opacities, the latter likely scarring. In the apices there is pleural parenchymal scarring. Mild to moderate centrilobular emphysema is noted most evident near the lung apices. There is no evidence of pulmonary edema. No pleural effusion or pneumothorax. Limited upper abdomen: Left renal atrophy. No acute findings. No convincing liver or adrenal mass on the included field of view. Musculoskeletal: Bones are demineralized. There are degenerative changes throughout the visualized spine old right rib fractures noted. No osteoblastic or osteolytic lesions. IMPRESSION: 1. Bilateral lower lobe peribronchovascular reticular and airspace opacities with several airspace opacities appearing masslike. This is most likely infectious/ inflammatory in origin. Neoplastic disease is not excluded. Followup options include tissue sampling of the larger left lower lobe mass like consolidation, short-term CT follow-up following therapy if there are symptoms suggestive of infection, or follow-up PET-CT. 2. Focal descending thoracic aortic aneurysm measuring 5.5 cm. Cardiothoracic surgical follow-up is recommended. 3. There are chronic appearing changes  of apical pleural parenchymal scarring and emphysema as well as extensive aortic and branch vessel atherosclerotic disease and coronary artery calcifications. Electronically Signed   By: Amie Portland M.D.   On: 04/26/2016 15:25     I have independently reviewed the above radiologic studies.  Recent Lab Findings: Lab Results  Component Value Date   WBC 6.3 06/19/2016   HGB 7.5 (L) 06/19/2016   HCT 22.2 (L) 06/19/2016   PLT 120 (L) 06/19/2016   GLUCOSE 147 (H) 06/18/2016   ALT 15 (L) 06/13/2016   AST 30 06/13/2016   NA 139 06/18/2016   K 3.7 06/18/2016   CL 107 06/18/2016   CREATININE 1.31 (H) 06/18/2016   BUN 14 06/18/2016   CO2 27 06/18/2016   INR 2.07 06/19/2016      Assessment / Plan:   1/ Focal descending thoracic aortic aneurysm measuring 5.5 cm 2/ Bilateral lower lobe peribronchovascular reticular and airspace opacities with several airspace opacities appearing masslike. Could be  infectious/ inflammatory in origin, on serial CT this area has decreased in size so is much more likely to be inflammatory 3/ Severe COPD- long term and current tobacco use 4/ Protein malnutrition 5/extensive aortic and branch vessel atherosclerotic disease and coronary artery calcifications 6/ long term anticoagulation on coumadin, history of AF and stroke 7/ Diabetic with peripheral vascular, cerebral vascular complications and neuropathy complications   Discussed with patient and family the dx of 5.7cm saccular descending thoracic aneurysm, risk of rupture and the possibility of aortic stenting for treatment of thoracic aneurysm, however with degree of vascular calcification  Will complicate access and my require direct common iliac graft for access. Review of CTA suggest a conduit to the right common iliac artery and placement of 31 x 15 or possibility 34 x 15 cm gore tag device via a 22 french sheath. Patient has seen Dr Myra Gianotti as vascular consult and pulmonary service has evaluated   bilateral air space disease  Patient has cardiology clearance by Dr. Tresa Endo arranged for next week.  Patient would like  to wait until after September 21 to proceed with thoracic stenting of his saccular aneurysm, I will correlate the timing of this with Dr. Lucile CraterBrabham  Maleya Leever B Tamitha Norell MD      [address] [place] [id number] Office [phone]   Beeper [phone]  07/15/2016 12:17 PM

## 2016-07-20 ENCOUNTER — Ambulatory Visit (INDEPENDENT_AMBULATORY_CARE_PROVIDER_SITE_OTHER)
Admission: RE | Admit: 2016-07-20 | Discharge: 2016-07-20 | Disposition: A | Discharge status: Home / Self Care | Payer: Medicare Other | Source: Ambulatory Visit | Attending: Family | Admitting: Family

## 2016-07-20 ENCOUNTER — Encounter: Payer: Self-pay | Admitting: Family

## 2016-07-20 ENCOUNTER — Ambulatory Visit (HOSPITAL_COMMUNITY)
Admission: RE | Admit: 2016-07-20 | Discharge: 2016-07-20 | Disposition: A | Discharge status: Home / Self Care | Payer: Medicare Other | Source: Ambulatory Visit | Attending: Family | Admitting: Family

## 2016-07-20 ENCOUNTER — Ambulatory Visit (INDEPENDENT_AMBULATORY_CARE_PROVIDER_SITE_OTHER): Payer: Medicare Other | Admitting: Family

## 2016-07-20 VITALS — BP 136/64 | HR 68 | Ht 68.0 in | Wt 135.2 lb

## 2016-07-20 DIAGNOSIS — I712 Thoracic aortic aneurysm, without rupture, unspecified: Secondary | ICD-10-CM

## 2016-07-20 DIAGNOSIS — I6523 Occlusion and stenosis of bilateral carotid arteries: Secondary | ICD-10-CM | POA: Insufficient documentation

## 2016-07-20 DIAGNOSIS — Z9889 Other specified postprocedural states: Secondary | ICD-10-CM

## 2016-07-20 DIAGNOSIS — I779 Disorder of arteries and arterioles, unspecified: Secondary | ICD-10-CM | POA: Insufficient documentation

## 2016-07-20 DIAGNOSIS — Z720 Tobacco use: Secondary | ICD-10-CM

## 2016-07-20 DIAGNOSIS — Z95828 Presence of other vascular implants and grafts: Secondary | ICD-10-CM | POA: Diagnosis not present

## 2016-07-20 DIAGNOSIS — Z48812 Encounter for surgical aftercare following surgery on the circulatory system: Secondary | ICD-10-CM | POA: Diagnosis present

## 2016-07-20 DIAGNOSIS — F17200 Nicotine dependence, unspecified, uncomplicated: Secondary | ICD-10-CM

## 2016-07-20 LAB — VAS US CAROTID
LCCADDIAS: 7 cm/s
LCCADSYS: 47 cm/s
LCCAPSYS: 55 cm/s
Left CCA prox dias: 9 cm/s
Left ICA dist dias: -22 cm/s
Left ICA dist sys: -78 cm/s
Left ICA prox dias: 23 cm/s
Left ICA prox sys: 87 cm/s
RCCADSYS: -76 cm/s
RCCAPDIAS: 11 cm/s
RIGHT CCA MID DIAS: 5 cm/s
Right CCA prox sys: 51 cm/s

## 2016-07-20 NOTE — Patient Instructions (Signed)
Thoracic Aortic Aneurysm An aneurysm is a bulge in an artery. It happens when the wall of the artery is weakened or damaged. If the aneurysm gets too big, it bursts (ruptures) and severe bleeding occurs. A thoracic aortic aneurysm is an aneurysm that occurs in the first part of the aorta, between the heart and the diaphragm. The aorta is the main artery and supplies blood from the heart to the rest of the body. A thoracic aortic aneurysm can enlarge and rupture or blood can flow between the layers of the wall of the aorta through a tear (aorticdissection). Both of these conditions can cause bleeding inside the body and can be life threatening unless diagnosed and treated promptly. CAUSES  The exact cause of a thoracic aortic aneurysm is often unknown. Some contributing factors are:   A hardening of the arteries caused by the buildup of fat and other substances in the lining of a blood vessel (arteriosclerosis).  Inflammation of the walls of an artery (arteritis).  Connective tissue diseases, such as Marfan syndrome.  Injury or trauma to the aorta.  An infection, such as syphilis or staphylococcus, in the wall of the aorta (infectious aortitis) caused by bacteria. RISK FACTORS  Risk factors that contribute to a thoracic aortic aneurysm may include:  Age older than 19 years.  High blood pressure (hypertension).  Male gender.  Ethnicity (white race).  Obesity.  Family history of aneurysm (first degree relatives only).  Tobacco use. PREVENTION  The following healthy lifestyle habits may help decrease your risk of a thoracic aortic aneurysm:  Quitting smoking. Smoking can raise your blood pressure and cause arteriosclerosis.  Limiting or avoiding alcohol.  Keeping your blood pressure, blood sugar level, and cholesterol levels within normal limits.  Decreasing your salt intake. In some people, too much salt can raise blood pressure and increase your risk of abdominal aortic  aneurysm.  Eating a diet low in saturated fats and cholesterol.  Increasing your fiber intake by including whole grains, vegetables, and fruits in your diet. Eating these foods may help lower blood pressure.  Maintaining a healthy weight.  Staying physically active and exercising regularly. SYMPTOMS  The symptoms of thoracic aortic aneurysm may vary depending on the size and rate of growth of the aneurysm. Most grow slowly and do not have any symptoms. When symptoms do occur, they may include:  Pain (chest, back, sides, or abdomen). The pain may vary in intensity. A sudden onset of severe pain may indicate that the aneurysm has ruptured.  Hoarseness.  Cough.  Shortness of breath.  Swallowing problems.  Nausea or vomiting or both. DIAGNOSIS  Since most unruptured thoracic aortic aneurysms have no symptoms, they are often discovered during diagnostic exams for other conditions. An aneurysm may be found during the following procedures:  Ultrasonography (a one-time screening for thoracic aortic aneurysm by ultrasonography is also recommended for all men aged 63-75 years who have ever smoked).  X-ray exams.  A CT scan.  An MRI.  Angiography or arteriography. TREATMENT  Treatment of a thoracic aortic aneurysm depends on the size of your aneurysm, your age, and risk factors for rupture. Medicine to control blood pressure and pain may be used to manage aneurysms smaller than 2.3 in (6 cm). Regular monitoring for enlargement may be recommended by your health care provider if:  The aneurysm is 1.2-1.5 in (3-4 cm) in size (an annual ultrasonography may be recommended).  The aneurysm is 1.5-1.8 in (4-4.5 cm) in size (an ultrasonography every 6  months may be recommended).  The aneurysm is larger than 1.8 in (4.5 cm) in size (your health care provider may ask that you be examined by a vascular surgeon). If your aneurysm is larger than 2.2 in (5.5 cm) or if it is enlarging quickly,  surgical repair may be recommended. There are two main methods for repair of an aneurysm:   Endovascular repair (a minimally invasive surgery).  Open repair. This method is used if an endovascular repair is not possible.   This information is not intended to replace advice given to you by your health care provider. Make sure you discuss any questions you have with your health care provider.   Document Released: 11/01/2005 Document Revised: 08/22/2013 Document Reviewed: 05/14/2013 Elsevier Interactive Patient Education 2016 [employer].    Stroke Prevention Some medical conditions and behaviors are associated with an increased chance of having a stroke. You may prevent a stroke by making healthy choices and managing medical conditions. HOW CAN I REDUCE MY RISK OF HAVING A STROKE?   Stay physically active. Get at least 30 minutes of activity on most or all days.  Do not smoke. It may also be helpful to avoid exposure to secondhand smoke.  Limit alcohol use. Moderate alcohol use is considered to be:  No more than 2 drinks per day for men.  No more than 1 drink per day for nonpregnant women.  Eat healthy foods. This involves:  Eating 5 or more servings of fruits and vegetables a day.  Making dietary changes that address high blood pressure (hypertension), high cholesterol, diabetes, or obesity.  Manage your cholesterol levels.  Making food choices that are high in fiber and low in saturated fat, trans fat, and cholesterol may control cholesterol levels.  Take any prescribed medicines to control cholesterol as directed by your health care provider.  Manage your diabetes.  Controlling your carbohydrate and sugar intake is recommended to manage diabetes.  Take any prescribed medicines to control diabetes as directed by your health care provider.  Control your hypertension.  Making food choices that are low in salt (sodium), saturated fat, trans fat, and cholesterol is  recommended to manage hypertension.  Ask your health care provider if you need treatment to lower your blood pressure. Take any prescribed medicines to control hypertension as directed by your health care provider.  If you are 78-103 years of age, have your blood pressure checked every 3-5 years. If you are 36 years of age or older, have your blood pressure checked every year.  Maintain a healthy weight.  Reducing calorie intake and making food choices that are low in sodium, saturated fat, trans fat, and cholesterol are recommended to manage weight.  Stop drug abuse.  Avoid taking birth control pills.  Talk to your health care provider about the risks of taking birth control pills if you are over 20 years old, smoke, get migraines, or have ever had a blood clot.  Get evaluated for sleep disorders (sleep apnea).  Talk to your health care provider about getting a sleep evaluation if you snore a lot or have excessive sleepiness.  Take medicines only as directed by your health care provider.  For some people, aspirin or blood thinners (anticoagulants) are helpful in reducing the risk of forming abnormal blood clots that can lead to stroke. If you have the irregular heart rhythm of atrial fibrillation, you should be on a blood thinner unless there is a good reason you cannot take them.  Understand all your  medicine instructions.  Make sure that other conditions (such as anemia or atherosclerosis) are addressed. SEEK IMMEDIATE MEDICAL CARE IF:   You have sudden weakness or numbness of the face, arm, or leg, especially on one side of the body.  Your face or eyelid droops to one side.  You have sudden confusion.  You have trouble speaking (aphasia) or understanding.  You have sudden trouble seeing in one or both eyes.  You have sudden trouble walking.  You have dizziness.  You have a loss of balance or coordination.  You have a sudden, severe headache with no known cause.  You  have new chest pain or an irregular heartbeat. Any of these symptoms may represent a serious problem that is an emergency. Do not wait to see if the symptoms will go away. Get medical help at once. Call your local emergency services (911 in [place] Do not drive yourself to the hospital.   This information is not intended to replace advice given to you by your health care provider. Make sure you discuss any questions you have with your health care provider.   Document Released: 12/09/2004 Document Revised: 11/22/2014 Document Reviewed: 05/04/2013 Elsevier Interactive Patient Education 2016 [employer] Inc[employer]     Peripheral Vascular Disease Peripheral vascular disease (PVD) is a disease of the blood vessels that are not part of your heart and brain. A simple term for PVD is poor circulation. In most cases, PVD narrows the blood vessels that carry blood from your heart to the rest of your body. This can result in a decreased supply of blood to your arms, legs, and internal organs, like your stomach or kidneys. However, it most often affects a person's lower legs and feet. There are two types of PVD.  Organic PVD. This is the more common type. It is caused by damage to the structure of blood vessels.  Functional PVD. This is caused by conditions that make blood vessels contract and tighten (spasm). Without treatment, PVD tends to get worse over time. PVD can also lead to acute ischemic limb. This is when an arm or limb suddenly has trouble getting enough blood. This is a medical emergency. CAUSES Each type of PVD has many different causes. The most common cause of PVD is buildup of a fatty material (plaque) inside of your arteries (atherosclerosis). Small amounts of plaque can break off from the walls of the blood vessels and become lodged in a smaller artery. This blocks blood flow and can cause acute ischemic limb. Other common causes of PVD include:  Blood clots that form inside of blood  vessels.  Injuries to blood vessels.  Diseases that cause inflammation of blood vessels or cause blood vessel spasms.  Health behaviors and health history that increase your risk of developing PVD. RISK FACTORS  You may have a greater risk of PVD if you:  Have a family history of PVD.  Have certain medical conditions, including:  High cholesterol.  Diabetes.  High blood pressure (hypertension).  Coronary heart disease.  Past problems with blood clots.  Past injury, such as burns or a broken bone. These may have damaged blood vessels in your limbs.  Buerger disease. This is caused by inflamed blood vessels in your hands and feet.  Some forms of arthritis.  Rare birth defects that affect the arteries in your legs.  Use tobacco.  Do not get enough exercise.  Are obese.  Are age 76 or older. SIGNS AND SYMPTOMS  PVD may cause many different  symptoms. Your symptoms depend on what part of your body is not getting enough blood. Some common signs and symptoms include:  Cramps in your lower legs. This may be a symptom of poor leg circulation (claudication).  Pain and weakness in your legs while you are physically active that goes away when you rest (intermittent claudication).  Leg pain when at rest.  Leg numbness, tingling, or weakness.  Coldness in a leg or foot, especially when compared with the other leg.  Skin or hair changes. These can include:  Hair loss.  Shiny skin.  Pale or bluish skin.  Thick toenails.  Inability to get or maintain an erection (erectile dysfunction). People with PVD are more prone to developing ulcers and sores on their toes, feet, or legs. These may take longer than normal to heal. DIAGNOSIS Your health care provider may diagnose PVD from your signs and symptoms. The health care provider will also do a physical exam. You may have tests to find out what is causing your PVD and determine its severity. Tests may include:  Blood  pressure recordings from your arms and legs and measurements of the strength of your pulses (pulse volume recordings).  Imaging studies using sound waves to take pictures of the blood flow through your blood vessels (Doppler ultrasound).  Injecting a dye into your blood vessels before having imaging studies using:  X-rays (angiogram or arteriogram).  Computer-generated X-rays (CT angiogram).  A powerful electromagnetic field and a computer (magnetic resonance angiogram or MRA). TREATMENT Treatment for PVD depends on the cause of your condition and the severity of your symptoms. It also depends on your age. Underlying causes need to be treated and controlled. These include long-lasting (chronic) conditions, such as diabetes, high cholesterol, and high blood pressure. You may need to first try making lifestyle changes and taking medicines. Surgery may be needed if these do not work. Lifestyle changes may include:  Quitting smoking.  Exercising regularly.  Following a low-fat, low-cholesterol diet. Medicines may include:  Blood thinners to prevent blood clots.  Medicines to improve blood flow.  Medicines to improve your blood cholesterol levels. Surgical procedures may include:  A procedure that uses an inflated balloon to open a blocked artery and improve blood flow (angioplasty).  A procedure to put in a tube (stent) to keep a blocked artery open (stent implant).  Surgery to reroute blood flow around a blocked artery (peripheral bypass surgery).  Surgery to remove dead tissue from an infected wound on the affected limb.  Amputation. This is surgical removal of the affected limb. This may be necessary in cases of acute ischemic limb that are not improved through medical or surgical treatments. HOME CARE INSTRUCTIONS  Take medicines only as directed by your health care provider.  Do not use any tobacco products, including cigarettes, chewing tobacco, or electronic cigarettes.  If you need help quitting, ask your health care provider.  Lose weight if you are overweight, and maintain a healthy weight as directed by your health care provider.  Eat a diet that is low in fat and cholesterol. If you need help, ask your health care provider.  Exercise regularly. Ask your health care provider to suggest some good activities for you.  Use compression stockings or other mechanical devices as directed by your health care provider.  Take good care of your feet.  Wear comfortable shoes that fit well.  Check your feet often for any cuts or sores. SEEK MEDICAL CARE IF:  You have cramps in  your legs while walking.  You have leg pain when you are at rest.  You have coldness in a leg or foot.  Your skin changes.  You have erectile dysfunction.  You have cuts or sores on your feet that are not healing. SEEK IMMEDIATE MEDICAL CARE IF:  Your arm or leg turns cold and blue.  Your arms or legs become red, warm, swollen, painful, or numb.  You have chest pain or trouble breathing.  You suddenly have weakness in your face, arm, or leg.  You become very confused or lose the ability to speak.  You suddenly have a very bad headache or lose your vision.   This information is not intended to replace advice given to you by your health care provider. Make sure you discuss any questions you have with your health care provider.   Document Released: 12/09/2004 Document Revised: 11/22/2014 Document Reviewed: 04/11/2014 Elsevier Interactive Patient Education 2016 [employer].     Steps to Quit Smoking  Smoking tobacco can be harmful to your health and can affect almost every organ in your body. Smoking puts you, and those around you, at risk for developing many serious chronic diseases. Quitting smoking is difficult, but it is one of the best things that you can do for your health. It is never too late to quit. WHAT ARE THE BENEFITS OF QUITTING SMOKING? When you quit  smoking, you lower your risk of developing serious diseases and conditions, such as:  Lung cancer or lung disease, such as COPD.  Heart disease.  Stroke.  Heart attack.  Infertility.  Osteoporosis and bone fractures. Additionally, symptoms such as coughing, wheezing, and shortness of breath may get better when you quit. You may also find that you get sick less often because your body is stronger at fighting off colds and infections. If you are pregnant, quitting smoking can help to reduce your chances of having a baby of low birth weight. HOW DO I GET READY TO QUIT? When you decide to quit smoking, create a plan to make sure that you are successful. Before you quit:  Pick a date to quit. Set a date within the next two weeks to give you time to prepare.  Write down the reasons why you are quitting. Keep this list in places where you will see it often, such as on your bathroom mirror or in your car or wallet.  Identify the people, places, things, and activities that make you want to smoke (triggers) and avoid them. Make sure to take these actions:  Throw away all cigarettes at home, at work, and in your car.  Throw away smoking accessories, such as Set designer.  Clean your car and make sure to empty the ashtray.  Clean your home, including curtains and carpets.  Tell your family, friends, and coworkers that you are quitting. Support from your loved ones can make quitting easier.  Talk with your health care provider about your options for quitting smoking.  Find out what treatment options are covered by your health insurance. WHAT STRATEGIES CAN I USE TO QUIT SMOKING?  Talk with your healthcare provider about different strategies to quit smoking. Some strategies include:  Quitting smoking altogether instead of gradually lessening how much you smoke over a period of time. Research shows that quitting "cold [place]" is more successful than gradually quitting.  Attending  in-person counseling to help you build problem-solving skills. You are more likely to have success in quitting if you attend several counseling  sessions. Even short sessions of 10 minutes can be effective.  Finding resources and support systems that can help you to quit smoking and remain smoke-free after you quit. These resources are most helpful when you use them often. They can include:  Online chats with a Veterinary surgeon.  Telephone quitlines.  Printed Materials engineer.  Support groups or group counseling.  Text messaging programs.  Mobile phone applications.  Taking medicines to help you quit smoking. (If you are pregnant or breastfeeding, talk with your health care provider first.) Some medicines contain nicotine and some do not. Both types of medicines help with cravings, but the medicines that include nicotine help to relieve withdrawal symptoms. Your health care provider may recommend:  Nicotine patches, gum, or lozenges.  Nicotine inhalers or sprays.  Non-nicotine medicine that is taken by mouth. Talk with your health care provider about combining strategies, such as taking medicines while you are also receiving in-person counseling. Using these two strategies together makes you more likely to succeed in quitting than if you used either strategy on its own. If you are pregnant or breastfeeding, talk with your health care provider about finding counseling or other support strategies to quit smoking. Do not take medicine to help you quit smoking unless told to do so by your health care provider. WHAT THINGS CAN I DO TO MAKE IT EASIER TO QUIT? Quitting smoking might feel overwhelming at first, but there is a lot that you can do to make it easier. Take these important actions:  Reach out to your family and friends and ask that they support and encourage you during this time. Call telephone quitlines, reach out to support groups, or work with a counselor for support.  Ask people who  smoke to avoid smoking around you.  Avoid places that trigger you to smoke, such as bars, parties, or smoke-break areas at work.  Spend time around people who do not smoke.  Lessen stress in your life, because stress can be a smoking trigger for some people. To lessen stress, try:  Exercising regularly.  Deep-breathing exercises.  Yoga.  Meditating.  Performing a body scan. This involves closing your eyes, scanning your body from head to toe, and noticing which parts of your body are particularly tense. Purposefully relax the muscles in those areas.  Download or purchase mobile phone or tablet apps (applications) that can help you stick to your quit plan by providing reminders, tips, and encouragement. There are many free apps, such as QuitGuide from the [employer] developer for Disease Control and Prevention). You can find other support for quitting smoking (smoking cessation) through smokefree.gov and other websites. HOW WILL I FEEL WHEN I QUIT SMOKING? Within the first 24 hours of quitting smoking, you may start to feel some withdrawal symptoms. These symptoms are usually most noticeable 2-3 days after quitting, but they usually do not last beyond 2-3 weeks. Changes or symptoms that you might experience include:  Mood swings.  Restlessness, anxiety, or irritation.  Difficulty concentrating.  Dizziness.  Strong cravings for sugary foods in addition to nicotine.  Mild weight gain.  Constipation.  Nausea.  Coughing or a sore throat.  Changes in how your medicines work in your body.  A depressed mood.  Difficulty sleeping (insomnia). After the first 2-3 weeks of quitting, you may start to notice more positive results, such as:  Improved sense of smell and taste.  Decreased coughing and sore throat.  Slower heart rate.  Lower blood pressure.  Clearer skin.  The  ability to breathe more easily.  Fewer sick days. Quitting smoking is very challenging for most people. Do  not get discouraged if you are not successful the first time. Some people need to make many attempts to quit before they achieve long-term success. Do your best to stick to your quit plan, and talk with your health care provider if you have any questions or concerns.   This information is not intended to replace advice given to you by your health care provider. Make sure you discuss any questions you have with your health care provider.   Document Released: 10/26/2001 Document Revised: 03/18/2015 Document Reviewed: 03/18/2015 Elsevier Interactive Patient Education [employer]

## 2016-07-20 NOTE — Progress Notes (Signed)
[facility] [place] HISTORY AND PHYSICAL   MRN : [id number]  History of Present Illness:   [name] is a 75 y.o. male patient of Dr. Myra Gianotti whom he has been evaluating with Dr. Tyrone Sage for thoracic stent graft to treat a descending thoracic aortic aneurysm.  He has a history of a left femoral to posterior tibial bypass graft with arm vein by Dr. Hart Rochester.  After Dr. Myra Gianotti saw him in the office, he presented to the hospital with an occluded bypass graft.  He underwent successful thrombolysis of this on7/30/17.  Dr. Dennie Maizes note of 07/15/16 indicates that patient would like to wait until after August 05 2016 to proceed with thoracic stenting of his saccular aneurysm, he will correlate the timing of this with Dr. Myra Gianotti.  He had a repeat CT scan which shows the pulmonary process is decreasing, suggesting this is inflammatory versus infectious rather than neoplastic.  On his CT scan, the aneurysm has increased in size from 5.5-5.7.  The patient returns today for duplex surveillance of his carotid arteries and left LE arterial perfusion.   He denies any specific claudication symptoms but is able to walk one block and then stops because of fatigue. His biggest problem is severe back discomfort which is chronic in nature.   He had a mild stroke in March 2016 while on coumadin as manifested by weakness in his left hand and foot, and drawing of the left corner of his mouth; these symptoms lasted about 2 weeks. He was evaluated by MRI of his head as requested by his PCP, per pt. He had physical therapy for this. He denies any subsequent strokes or TIA's.   Pt denies any claudication symptoms with walking, but does have occasional left lateral calf pain which he attributes to low back issues, he takes oxycodone for back pain. His walking is limited by back pain. Pt denies any non healing wounds.  He has been losing weight, weighed 202 pounds in 2014. States he has  no appetite, denies post prandial abdominal pain. Wife states his PCP is aware of this; pt and wife attribute this to taking acarbose.   He used to have his carotid Duplex checked in Dr. Kandis Cocking office, but was checked in this office July, 2014: <40% right ICA stenosis, widely patent left ICA. Bilateral ECA stenosis.   Pt Diabetic: Yes, states in good control Pt smoker: smoker, less than 1/2 ppd, mostly switched to vapor, low nicotine, smoked 60 + years   Pt meds include:  Statin :Yes Betablocker: Yes ASA: Yes Other anticoagulants/antiplatelets: taking coumadin for PAD, Pletal    Current Outpatient Prescriptions  Medication Sig Dispense Refill  . ALPRAZolam (XANAX) 0.5 MG tablet Take 0.25 mg by mouth at bedtime as needed for anxiety.     Marland Kitchen amLODipine (NORVASC) 5 MG tablet Take 5 mg by mouth daily.     Marland Kitchen aspirin 81 MG tablet Take 81 mg by mouth daily.    Marland Kitchen atorvastatin (LIPITOR) 80 MG tablet Take 40 mg by mouth.     . cilostazol (PLETAL) 100 MG tablet Take 100 mg by mouth 2 (two) times daily.    . cloNIDine (CATAPRES) 0.3 MG tablet Take 0.1 mg by mouth 3 (three) times daily.   6  . docusate sodium (COLACE) 100 MG capsule Take 200 mg by mouth 2 (two) times daily.     . Ferrous Fumarate-Folic Acid (FERROCITE F PO) Take by mouth daily.    Marland Kitchen glipiZIDE (GLUCOTROL) 5  MG tablet Take 5 mg by mouth 2 (two) times daily before a meal.    . losartan (COZAAR) 100 MG tablet Take 100 mg by mouth daily.    . metoprolol tartrate (LOPRESSOR) 25 MG tablet TAKE 1 TABLET BY MOUTH TWICE DAILY 180 tablet 0  . mometasone-formoterol (DULERA) 200-5 MCG/ACT AERO Inhale 2 puffs into the lungs 2 (two) times daily. 1 Inhaler 5  . omega-3 acid ethyl esters (LOVAZA) 1 G capsule Take by mouth 2 (two) times daily. Takes 2 tablets    . oxyCODONE 20 MG TABS Take 1 tablet (20 mg total) by mouth every 8 (eight) hours as needed for moderate pain. 10 tablet 0  . pantoprazole (PROTONIX) 40 MG tablet Take 40 mg by mouth  2 (two) times daily.     . propafenone (RYTHMOL) 225 MG tablet Take 225 mg by mouth 2 (two) times daily.    Marland Kitchen Respiratory Therapy Supplies (FLUTTER) DEVI Use as directed. 1 each 0  . saxagliptin HCl (ONGLYZA) 2.5 MG TABS tablet Take 2.5 mg by mouth daily.    Marland Kitchen testosterone cypionate (DEPOTESTOTERONE CYPIONATE) 200 MG/ML injection Inject 100 mg into the skin every 14 (fourteen) days.     . Vitamin D, Ergocalciferol, (DRISDOL) 50000 UNITS CAPS capsule Take 50,000 Units by mouth every 7 (seven) days.    . Warfarin Sodium (COUMADIN PO) Take 4 mg by mouth daily. As directed     No current facility-administered medications for this visit.     Past Medical History:  Diagnosis Date  . AAA (abdominal aortic aneurysm) (HCC)   . Anemia   . Anxiety   . Arthritis    Gout  . Atrial fibrillation (HCC)   . Barrett esophagus   . Carotid artery occlusion    Carotid endarterectomy 2002 and 2004  . COPD (chronic obstructive pulmonary disease) (HCC)   . Diabetes mellitus   . Diverticulosis   . DVT (deep venous thrombosis) (HCC)   . Erectile dysfunction   . GERD (gastroesophageal reflux disease)   . Gout   . Hiatal hernia   . History of stress test    March 2012 post stress ejection fraction 56%, negative for ischemia.  Last 2-D echocardiogram March 2012 younger than 55% mild concentric left ventricular hypertrophy grade 1 diastolic dysfunction. Mildly dilated left atrium. 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  . Hyperlipidemia   . Hypertension   . Lumbar stenosis   . PAD (peripheral artery disease) (HCC)    Fem-Fem by-pass graft 1991. redo patch angioplasty in 2000 and 2004.  LEA dopplers: 04/2012- R-ABI 1.34, L-ABI 0.97, patent Left lower extremity graft.  . Pancreatitis   . Stroke [facility]) March 2016   Lihgt Stroke    Social History Social History  Substance Use Topics  . Smoking status: Heavy Tobacco Smoker    Packs/day: 0.50    Years: 60.00    Types: Cigarettes, E-cigarettes  . Smokeless  tobacco: Former Neurosurgeon    Types: Chew    Quit date: 05/21/1968  . Alcohol use No    Family History Family History  Problem Relation Age of Onset  . Heart disease Father   . Heart attack Father     X's 3  . Hyperlipidemia Father   . Hypertension Father   . Diabetes Brother   . Hypertension Brother   . Cancer Sister     type unknown    Surgical History Past Surgical History:  Procedure Laterality Date  . CAROTID ENDARTERECTOMY  2002  Left CEA  . CAROTID ENDARTERECTOMY  2007   Left CEA redo  . CATARACT EXTRACTION Left 02-02-95  . CATARACT EXTRACTION Right 04-20-95  . IR GENERIC HISTORICAL  06/13/2016   IR INFUSION THROMBOL ARTERIAL INITIAL (MS) 06/13/2016 Richarda Overlie, MD [facility]  . IR GENERIC HISTORICAL  06/13/2016   IR ANGIOGRAM EXTREMITY LEFT 06/13/2016 Richarda Overlie, MD [facility]  . IR GENERIC HISTORICAL  06/13/2016   IR US GUIDE VASC ACCESS LEFT 06/13/2016 Richarda Overlie, MD [facility]  . IR GENERIC HISTORICAL  06/14/2016   IR THROMB F/U EVAL ART/VEN FINAL DAY (MS) 06/14/2016 Oley Balm, MD [facility]  . LUMBAR DISC SURGERY  1980  . PR VEIN BYPASS GRAFT,AORTO-FEM-POP  2007   Left Fem-post. tibial  . PR VEIN BYPASS GRAFT,AORTO-FEM-POP  1999-2004   numerous revision Left Fem-pop    Allergies  Allergen Reactions  . Lopressor [Metoprolol Tartrate] Palpitations    LOPRESSOR bothers the patient.  . Niacin     Other reaction(s): Facial Edema (intolerance)    Current Outpatient Prescriptions  Medication Sig Dispense Refill  . ALPRAZolam (XANAX) 0.5 MG tablet Take 0.25 mg by mouth at bedtime as needed for anxiety.     Marland Kitchen amLODipine (NORVASC) 5 MG tablet Take 5 mg by mouth daily.     Marland Kitchen aspirin 81 MG tablet Take 81 mg by mouth daily.    Marland Kitchen atorvastatin (LIPITOR) 80 MG tablet Take 40 mg by mouth.     . cilostazol (PLETAL) 100 MG tablet Take 100 mg by mouth 2 (two) times daily.    . cloNIDine (CATAPRES) 0.3 MG tablet Take 0.1 mg by mouth 3 (three) times daily.   6  . docusate  sodium (COLACE) 100 MG capsule Take 200 mg by mouth 2 (two) times daily.     . Ferrous Fumarate-Folic Acid (FERROCITE F PO) Take by mouth daily.    Marland Kitchen glipiZIDE (GLUCOTROL) 5 MG tablet Take 5 mg by mouth 2 (two) times daily before a meal.    . losartan (COZAAR) 100 MG tablet Take 100 mg by mouth daily.    . metoprolol tartrate (LOPRESSOR) 25 MG tablet TAKE 1 TABLET BY MOUTH TWICE DAILY 180 tablet 0  . mometasone-formoterol (DULERA) 200-5 MCG/ACT AERO Inhale 2 puffs into the lungs 2 (two) times daily. 1 Inhaler 5  . omega-3 acid ethyl esters (LOVAZA) 1 G capsule Take by mouth 2 (two) times daily. Takes 2 tablets    . oxyCODONE 20 MG TABS Take 1 tablet (20 mg total) by mouth every 8 (eight) hours as needed for moderate pain. 10 tablet 0  . pantoprazole (PROTONIX) 40 MG tablet Take 40 mg by mouth 2 (two) times daily.     . propafenone (RYTHMOL) 225 MG tablet Take 225 mg by mouth 2 (two) times daily.    Marland Kitchen Respiratory Therapy Supplies (FLUTTER) DEVI Use as directed. 1 each 0  . saxagliptin HCl (ONGLYZA) 2.5 MG TABS tablet Take 2.5 mg by mouth daily.    Marland Kitchen testosterone cypionate (DEPOTESTOTERONE CYPIONATE) 200 MG/ML injection Inject 100 mg into the skin every 14 (fourteen) days.     . Vitamin D, Ergocalciferol, (DRISDOL) 50000 UNITS CAPS capsule Take 50,000 Units by mouth every 7 (seven) days.    . Warfarin Sodium (COUMADIN PO) Take 4 mg by mouth daily. As directed     No current facility-administered medications for this visit.      REVIEW OF SYSTEMS: See HPI for pertinent positives and negatives.  Physical Examination Vitals:  07/20/16 1450  BP: 136/64  Pulse: 68  SpO2: 97%  Weight: 135 lb 3.2 oz (61.3 kg)  Height: 5\' 8"  (1.727 m)   Body mass index is 20.56 kg/m.  General: WDWN in thin male in NAD Gait: Normal HENT: WNL Eyes: Pupils equal Pulmonary: Mildly labored breathing at rest, limited air movement in all fields Cardiac: RRR, no murmur detected, distant heart  sounds  Abdomen: soft, NT, no palpable masses Skin: no rashes, no ulcers;no cellulitis.   VASCULAR EXAM  Carotid Bruits Left Right   Positive, moderate Positive, soft   Aorta is not palpable. Radial pulses are 2+ palpable bilateral   VASCULAR EXAM: Extremities without ischemic changes  without Gangrene; without open wounds.     LE Pulses LEFT RIGHT   FEMORAL 3+ palpable 3+ palpable    POPLITEAL not palpable  not palpable   POSTERIOR TIBIAL 3+ palpable  not palpable    DORSALIS PEDIS  ANTERIOR TIBIAL not palpable  not palpable     Musculoskeletal: no muscle wasting or atrophy; no edema Neurologic: A&O X 3; Appropriate Affect ;  MOTOR FUNCTION: 5/5 Symmetric, CN 2-12 intact Speech is fluent/normal. Has some hearing loss.     ASSESSMENT:  [name] is a 75 y.o. male who is s/p left lower extremity femoral to posterior tibial bypass which was done using composite bilateral cephalic vein grafts in 2007. His walking is limited by back pain and dyspnea, he does not seem to indicate claudication symptoms with walking, he has no signs of ischemia in his feet/legs. He may not be able to walk enough to elicit claudication symptoms.  He has had previous left carotid endarterectomy and redo left carotid endarterectomy most recent of which was 2007.  He had a mild stroke in March 2016 while on coumadin as manifested by weakness in his left hand and foot, and drawing of the left corner of his mouth; these symptoms lasted about 2 weeks.  He denies any subsequent strokes or TIA's.   DATA Today's left LE arterial duplex suggests aneurysmal dilation of the left and mid thigh level bypass graft, with partially occlusive mural thrombus,  measuring roughly 3.0 cm x 3.3 cm.  The left mid calf level bypass graft velocities suggest >50% stenosis. 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 ABI's have worsened bilaterally: right remains [place] with TBI of 0.35 compared to 0.75 on 12/30/15. Left ABI is 0.89 with TBI of 0.46, compared to 1.04 with TBI of 0.78 on 12/30/15.   Carotid duplex indicates <40% bilateral ICA stenoses and a patent left carotid endarterectomy site. No significant change in comparison to the last exam on 12/24/2014 and 06/23/15.    I spoke with Dr. Hart RochesterLawson re pt status on 12/30/15. Any subsequent revascularization of left LE must necessarily be for limb salvage.   His atherosclerotic risk factors include controlled DM and active smoking, see Plan.  He takes coumadin, a statin, Pletal, and ASA.    Wife states Dr. Myra GianottiBrabham told them that the left leg bypass aneurysm will be addressed after the thoracic aneurysm.  I spoke with Dr. Arbie CookeyEarly re pt HPI, plan for repair of thoracic aneurysm prior to repair of left leg bypass aneurysm.  PLAN:   The patient was counseled re smoking cessation and given several free resources re smoking cessation.   Thoracic aortic aneuysm repair about the end of September, per wife, with Dr. Myra GianottiBrabham and Dr.  Gerhardt.  Based on today's exam and non-invasive vascular lab results, and after discussing with Dr. Arbie Cookey,  the patient will follow up in 3 months with the following tests: ABI's and left LE arterial duplex, follow up with Dr. Myra Gianotti; carotid duplex in a year. I discussed in depth with the patient the nature of atherosclerosis, and emphasized the importance of maximal medical management including strict control of blood pressure, blood glucose, and lipid levels, obtaining regular exercise, and cessation of smoking.  The patient is aware that without maximal medical management the underlying  atherosclerotic disease process will progress, limiting the benefit of any interventions. . The patient was given information about stroke prevention and what symptoms should prompt the patient to seek immediate medical care.  The patient was given information about PAD including signs, symptoms, treatment, what symptoms should prompt the patient to seek immediate medical care, and risk reduction measures to take. Thank you for allowing [place] to participate in this patient's care.  Charisse March, RN, MSN, FNP-C [facility] Office: [phone]  Clinic MD: Early 07/20/2016 3:07 PM

## 2016-07-22 ENCOUNTER — Telehealth: Payer: Self-pay

## 2016-07-22 NOTE — Telephone Encounter (Signed)
Opened in error

## 2016-07-23 ENCOUNTER — Ambulatory Visit (INDEPENDENT_AMBULATORY_CARE_PROVIDER_SITE_OTHER): Payer: Medicare Other | Admitting: Physician Assistant

## 2016-07-23 ENCOUNTER — Encounter: Payer: Self-pay | Admitting: Physician Assistant

## 2016-07-23 VITALS — BP 100/60 | HR 76 | Ht 68.0 in | Wt 136.6 lb

## 2016-07-23 DIAGNOSIS — I4510 Unspecified right bundle-branch block: Secondary | ICD-10-CM

## 2016-07-23 DIAGNOSIS — Z01810 Encounter for preprocedural cardiovascular examination: Secondary | ICD-10-CM

## 2016-07-23 DIAGNOSIS — Z7901 Long term (current) use of anticoagulants: Secondary | ICD-10-CM | POA: Diagnosis not present

## 2016-07-23 DIAGNOSIS — I10 Essential (primary) hypertension: Secondary | ICD-10-CM | POA: Diagnosis not present

## 2016-07-23 DIAGNOSIS — I480 Paroxysmal atrial fibrillation: Secondary | ICD-10-CM | POA: Diagnosis not present

## 2016-07-23 DIAGNOSIS — I779 Disorder of arteries and arterioles, unspecified: Secondary | ICD-10-CM

## 2016-07-23 NOTE — Progress Notes (Signed)
Cardiology Office Note   Date:  07/23/2016   ID:  [name], DOB Apr 16, 1941, MRN [id number]  PCP:  Ethel Rana  Cardiologist:  Dr. Venora Maples, PA-C   Chief Complaint  Patient presents with  . Follow-up    pt to have surgery 08/19/2016    History of Present Illness: [name] is a 75 y.o. 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   MV 01/2011 was low risk w/ diaphragmatic attenuation, no isch or infarct, EF 56%  Seen in [facility] office 09/05 and LE duplex w/ aneurysmal dilation of the left and mid thigh level bypass graft, with partially occlusive mural thrombus, measuring roughly 3.0 cm x 3.3 cm. The left mid calf level bypass graft velocities suggest >50% stenosis. Plan for repair of thoracic aneurysm 08/2016, reassess left leg bypass aneurysm in 3 months.  [name] presents for preop cardiac eval.  He never gets chest pain. His activity level is significantly limited by his back issues. Although he has PAD, he is asymptomatic with this. He walks with a cane because his back is very bad and he is not able to walk very far before he has to stop. He is not able to do steps. He does not know how far he can walk without getting short of breath.  He is trying very hard to quit smoking and has significantly cut down. He is compliant with his medications. He never gets palpitations or feels that he is in atrial fibrillation. He was having problems with his diabetes medicines and eating poorly. He has lost about 60 pounds. His medications have been changed, and he does not think he will lose any more weight. He is 3 inches shorter than he was formerly because of his back.  He is concerned about the surgery, but doesn't see any real alternative.   Past Medical History:  Diagnosis  Date  . AAA (abdominal aortic aneurysm) (HCC)   . Anemia   . Anxiety   . Arthritis    Gout  . Atrial fibrillation (HCC)   . TRUE Shackleford esophagus   . Carotid artery occlusion    Carotid endarterectomy 2002 and 2004  . COPD (chronic obstructive pulmonary disease) (HCC)   . Diabetes mellitus   . Diverticulosis   . DVT (deep venous thrombosis) (HCC)   . Erectile dysfunction   . GERD (gastroesophageal reflux disease)   . Gout   . Hiatal hernia   . History of stress test    March 2012 post stress ejection fraction 56%, negative for ischemia.  Last 2-D echocardiogram March 2012 younger than 55% mild concentric left ventricular hypertrophy grade 1 diastolic dysfunction. Mildly dilated left atrium. 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  . Hyperlipidemia   . Hypertension   . Lumbar stenosis   . PAD (peripheral artery disease) (HCC)    Fem-Fem by-pass graft 1991. redo patch angioplasty in 2000 and 2004.  LEA dopplers: 04/2012- R-ABI 1.34, L-ABI 0.97, patent Left lower extremity graft.  . Pancreatitis   . Stroke [facility]) March 2016   Lihgt Stroke    Past Surgical History:  Procedure Laterality Date  . CAROTID ENDARTERECTOMY  2002   Left CEA  . CAROTID ENDARTERECTOMY  2007   Left CEA redo  . CATARACT EXTRACTION Left 02-02-95  . CATARACT EXTRACTION Right 04-20-95  .  IR GENERIC HISTORICAL  06/13/2016   IR INFUSION THROMBOL ARTERIAL INITIAL (MS) 06/13/2016 Richarda Overlie, MD [facility]  . IR GENERIC HISTORICAL  06/13/2016   IR ANGIOGRAM EXTREMITY LEFT 06/13/2016 Richarda Overlie, MD [facility]  . IR GENERIC HISTORICAL  06/13/2016   IR US GUIDE VASC ACCESS LEFT 06/13/2016 Richarda Overlie, MD [facility]  . IR GENERIC HISTORICAL  06/14/2016   IR THROMB F/U EVAL ART/VEN FINAL DAY (MS) 06/14/2016 Oley Balm, MD [facility]  . LUMBAR DISC SURGERY  1980  . PR VEIN BYPASS GRAFT,AORTO-FEM-POP  2007   Left Fem-post. tibial  . PR VEIN BYPASS GRAFT,AORTO-FEM-POP  1999-2004   numerous revision Left Fem-pop    Current  Outpatient Prescriptions  Medication Sig Dispense Refill  . ALPRAZolam (XANAX) 0.5 MG tablet Take 0.25 mg by mouth at bedtime as needed for anxiety.     Marland Kitchen amLODipine (NORVASC) 5 MG tablet Take 5 mg by mouth daily.     Marland Kitchen aspirin 81 MG tablet Take 81 mg by mouth daily.    Marland Kitchen atorvastatin (LIPITOR) 80 MG tablet Take 40 mg by mouth.     . cilostazol (PLETAL) 100 MG tablet Take 100 mg by mouth 2 (two) times daily.    . cloNIDine (CATAPRES) 0.3 MG tablet Take 0.1 mg by mouth 3 (three) times daily.   6  . docusate sodium (COLACE) 100 MG capsule Take 200 mg by mouth 2 (two) times daily.     . Ferrous Fumarate-Folic Acid (FERROCITE F PO) Take by mouth daily.    Marland Kitchen glipiZIDE (GLUCOTROL) 5 MG tablet Take 5 mg by mouth 2 (two) times daily before a meal.    . losartan (COZAAR) 100 MG tablet Take 100 mg by mouth daily.    . metoprolol tartrate (LOPRESSOR) 25 MG tablet TAKE 1 TABLET BY MOUTH TWICE DAILY 180 tablet 0  . mometasone-formoterol (DULERA) 200-5 MCG/ACT AERO Inhale 2 puffs into the lungs 2 (two) times daily. 1 Inhaler 5  . omega-3 acid ethyl esters (LOVAZA) 1 G capsule Take by mouth 2 (two) times daily. Takes 2 tablets    . oxyCODONE 20 MG TABS Take 1 tablet (20 mg total) by mouth every 8 (eight) hours as needed for moderate pain. 10 tablet 0  . pantoprazole (PROTONIX) 40 MG tablet Take 40 mg by mouth 2 (two) times daily.     . propafenone (RYTHMOL) 225 MG tablet Take 225 mg by mouth 2 (two) times daily.    Marland Kitchen Respiratory Therapy Supplies (FLUTTER) DEVI Use as directed. 1 each 0  . saxagliptin HCl (ONGLYZA) 2.5 MG TABS tablet Take 2.5 mg by mouth daily.    Marland Kitchen testosterone cypionate (DEPOTESTOTERONE CYPIONATE) 200 MG/ML injection Inject 100 mg into the skin every 14 (fourteen) days.     . Vitamin D, Ergocalciferol, (DRISDOL) 50000 UNITS CAPS capsule Take 50,000 Units by mouth every 7 (seven) days.    . Warfarin Sodium (COUMADIN PO) Take 4 mg by mouth daily. As directed     No current  facility-administered medications for this visit.     Allergies:   Lopressor [metoprolol tartrate] and Niacin    Social History:  The patient  reports that he has been smoking Cigarettes and E-cigarettes.  He has a 30.00 pack-year smoking history. He quit smokeless tobacco use about 48 years ago. His smokeless tobacco use included Chew. He reports that he does not drink alcohol or use drugs.   Family History:  The patient's family history includes Cancer in his  sister; Diabetes in his brother; Heart attack in his father; Heart disease in his father; Hyperlipidemia in his father; Hypertension in his brother and father.    ROS:  Please see the history of present illness. All other systems are reviewed and negative.    PHYSICAL EXAM: VS:  BP 100/60 (BP Location: Right Arm, Patient Position: Sitting, Cuff Size: Normal)   Pulse 76   Ht 5\' 8"  (1.727 m)   Wt 136 lb 9.6 oz (62 kg)   SpO2 96%   BMI 20.77 kg/m  , BMI Body mass index is 20.77 kg/m. GEN: Well nourished, well developed, male in no acute distress  HEENT: normal for age  Neck: JVD 8 cm, bilateral carotid bruit, no masses Cardiac: RRR; soft murmur, no rubs, or gallops Respiratory: Scattered rales bilaterally, normal work of breathing GI: soft, nontender, nondistended, + BS MS: no deformity or atrophy; no edema; distal pulses are 2+ in upper extremities, decreased but palpable in lower extremities   Skin: warm and dry, no rash Neuro:  Strength and sensation are intact Psych: euthymic mood, full affect   EKG:  EKG is ordered today. The ekg ordered today demonstrates sinus rhythm, right bundle branch block is new from April 2016. QRS duration on ECG from April 2016 was 124 ms   Recent Labs: 06/13/2016: ALT 15 06/18/2016: BUN 14; Creatinine, Ser 1.31; Potassium 3.7; Sodium 139 06/19/2016: Hemoglobin 7.5; Platelets 120    Lipid Panel No results found for: CHOL, TRIG, HDL, CHOLHDL, VLDL, LDLCALC, LDLDIRECT   Wt Readings from  Last 3 Encounters:  07/23/16 136 lb 9.6 oz (62 kg)  07/20/16 135 lb 3.2 oz (61.3 kg)  07/15/16 137 lb (62.1 kg)     Other studies Reviewed: Additional studies/ records that were reviewed today include: Office notes, hospital records and testing.  ASSESSMENT AND PLAN:  1.  Preoperative evaluation: I discussed the case with Dr. Antoine Poche. Mr. Wieczorek has multiple cardiac risk factors, the strongest one of these is his extensive history of PAD. His last stress test was negative, but it was 5 years ago. His activity level is poor at baseline.  The patient and his wife were advised that his preoperative risk is high. There is no real way to reduce it. Therefore, he is considered at high risk for the planned surgery, but no further testing needs to be done prior to it. The patient wishes to go ahead with the surgery as he feels that this is his best chance.  Continue his current medications, which include aspirin, statin, ARB, and beta blocker  2. Paroxysmal atrial fibrillation: He is on Rythmol and metoprolol. The right bundle branch block was not seen on his previous ECG which is almost a year and a half old. This is listed as a possible side effect of the protocol for 9. Continue these for now.  Dr. Tresa Endo to review and advise if the right bundle branch block is a reason to change the pump often and therapy.  3. Chronic anticoagulation: He is on Coumadin, continue this. Minimizes disruption in the peri-Operative period. CHA2DS2VASc=7 (DM, HTN, PAD, age x 2, TIA x 2)  4. Hypertension: His blood pressures on the low side of normal. He is compliant with his medications. If his blood pressure is lower, this is likely beneficial to him. No med changes.   Current medicines are reviewed at length with the patient today.  The patient does not have concerns regarding medicines.  The following changes have been made:  no change  Labs/ tests ordered today include:  No orders of the defined types were  placed in this encounter.    Disposition:   FU with Dr. Tresa EndoKelly  Signed, Theodore DemarkBarrett, Joeph Szatkowski, PA-C  07/23/2016 5:04 PM    [facility] Phone: [phone]; Fax: [phone]  This note was written with the assistance of speech recognition software. Please excuse any transcriptional errors.

## 2016-07-23 NOTE — Patient Instructions (Addendum)
Medications:  Your physician recommends that you continue on your current medications as directed. Please refer to the Current Medication list given to you today.   Follow-Up:  You have been scheduled for a follow-up appointment with Dr. Tresa EndoKelly on October 22, 2016 at 8:00am. If you need to reschedule, call [phone] to speak with scheduling. Please try to reschedule 24-48 hrs in advance.  If you need a refill on your cardiac medications before your next appointment, please call your pharmacy.

## 2016-07-27 ENCOUNTER — Telehealth: Payer: Self-pay | Admitting: Pulmonary Disease

## 2016-07-27 NOTE — Telephone Encounter (Signed)
Called [facility], spoke with Loran SentersBetty Dulera 200-595mcg needs PA  PA phone # [phone] Member ID# [id number] Dx Code - J44.9 COPD  Spoke with Morrie SheldonAshley, [place] was submitted for clinical review.  Determination within 24/72 hours.  Will send to Shanda BumpsJessica to follow up and keep an eye out for determination form.

## 2016-07-29 NOTE — Telephone Encounter (Signed)
Called [facility] and spoke with Iantha FallenKenneth. Elwin SleightDulera has been approved >> 07/27/16 - 11/14/16. [facility]. They are aware that this has been approved. Nothing further was needed at this time.

## 2016-07-30 ENCOUNTER — Telehealth: Payer: Self-pay | Admitting: Pharmacist Clinician (PhC)/ Clinical Pharmacy Specialist

## 2016-07-30 NOTE — Telephone Encounter (Signed)
LMOM, need to determine who monitors warfarin INR.  Patient has CHADS2 score of 5 and CHADS2-VASc score of 7, with history of CVA.  Will need to be bridged with enoxaparin for upcoming procedure.

## 2016-07-30 NOTE — Telephone Encounter (Signed)
-----   Message from Darrol Jumphonda G Barrett, PA-C sent at 07/29/2016 10:28 AM EDT ----- Dr Tyrone SageGerhardt wants us to determine when to stop his coumadin (for PAF) before he comes in for a TEVAR on 10/11.  It looks like we don't manage his coumadin, not quite sure how to proceed. Suggestions?  Thanks, Bjorn Loserhonda

## 2016-08-02 NOTE — Telephone Encounter (Signed)
Wife returned call.  Patient has INR [facility].   Wife is worried about stopping warfarin due to history of stroke and recent clot (graft occlusion?).  Wanted to know if he should be on enoxaprin injections as well as warfarin.  Explained that PA monitoring him should make the determination as to whether he needs enoxaparin between INR checks, but that he will definitely need for his upcoming surgical procedure.    Wife voiced understanding that Deboraha Sprangagle would monitor, however called back later, concerned as to whether insurance would pay for enoxaparin.  [facility] as well as some VA benefits.  Explained that she will have to get prescription to pharmacy to determine cost.    LM at [facility] for clinical RN to call us regarding bridging protocol.

## 2016-08-02 NOTE — Telephone Encounter (Signed)
Spoke wit Biochemist, clinicalherrie RN at [facility] about need to lovenox bridge Mr [name].  She has already received call from wife today about getting prescription.  Explained need to bridge for Oct. 11 procedure.  RN knows they can contact us if need assistance with bridging

## 2016-08-05 ENCOUNTER — Other Ambulatory Visit: Payer: Self-pay

## 2016-08-17 ENCOUNTER — Other Ambulatory Visit: Payer: Self-pay

## 2016-08-17 ENCOUNTER — Ambulatory Visit (HOSPITAL_COMMUNITY): Payer: Medicare Other

## 2016-08-17 ENCOUNTER — Encounter (HOSPITAL_COMMUNITY): Payer: Self-pay

## 2016-08-17 ENCOUNTER — Ambulatory Visit (HOSPITAL_COMMUNITY)
Admission: RE | Admit: 2016-08-17 | Discharge: 2016-08-17 | Disposition: A | Discharge status: Home / Self Care | Payer: Medicare Other | Source: Ambulatory Visit | Attending: Anesthesiology | Admitting: Anesthesiology

## 2016-08-17 ENCOUNTER — Other Ambulatory Visit: Payer: Self-pay | Admitting: *Deleted

## 2016-08-17 ENCOUNTER — Encounter (HOSPITAL_COMMUNITY)
Admission: RE | Admit: 2016-08-17 | Discharge: 2016-08-17 | Disposition: A | Discharge status: Home / Self Care | Payer: Medicare Other | Source: Ambulatory Visit | Attending: Surgery | Admitting: Surgery

## 2016-08-17 DIAGNOSIS — I4891 Unspecified atrial fibrillation: Secondary | ICD-10-CM | POA: Diagnosis not present

## 2016-08-17 DIAGNOSIS — Z7982 Long term (current) use of aspirin: Secondary | ICD-10-CM | POA: Diagnosis not present

## 2016-08-17 DIAGNOSIS — Z01812 Encounter for preprocedural laboratory examination: Secondary | ICD-10-CM | POA: Diagnosis present

## 2016-08-17 DIAGNOSIS — R05 Cough: Secondary | ICD-10-CM

## 2016-08-17 DIAGNOSIS — I714 Abdominal aortic aneurysm, without rupture: Secondary | ICD-10-CM | POA: Insufficient documentation

## 2016-08-17 DIAGNOSIS — E119 Type 2 diabetes mellitus without complications: Secondary | ICD-10-CM | POA: Insufficient documentation

## 2016-08-17 DIAGNOSIS — J449 Chronic obstructive pulmonary disease, unspecified: Secondary | ICD-10-CM | POA: Diagnosis not present

## 2016-08-17 DIAGNOSIS — F17200 Nicotine dependence, unspecified, uncomplicated: Secondary | ICD-10-CM | POA: Insufficient documentation

## 2016-08-17 DIAGNOSIS — Z79899 Other long term (current) drug therapy: Secondary | ICD-10-CM | POA: Diagnosis not present

## 2016-08-17 DIAGNOSIS — I10 Essential (primary) hypertension: Secondary | ICD-10-CM | POA: Insufficient documentation

## 2016-08-17 DIAGNOSIS — E785 Hyperlipidemia, unspecified: Secondary | ICD-10-CM | POA: Insufficient documentation

## 2016-08-17 DIAGNOSIS — I7123 Aneurysm of the descending thoracic aorta, without rupture: Secondary | ICD-10-CM

## 2016-08-17 DIAGNOSIS — K219 Gastro-esophageal reflux disease without esophagitis: Secondary | ICD-10-CM | POA: Diagnosis not present

## 2016-08-17 DIAGNOSIS — I712 Thoracic aortic aneurysm, without rupture: Secondary | ICD-10-CM

## 2016-08-17 DIAGNOSIS — R059 Cough, unspecified: Secondary | ICD-10-CM

## 2016-08-17 DIAGNOSIS — I6529 Occlusion and stenosis of unspecified carotid artery: Secondary | ICD-10-CM | POA: Insufficient documentation

## 2016-08-17 DIAGNOSIS — Z01810 Encounter for preprocedural cardiovascular examination: Secondary | ICD-10-CM | POA: Diagnosis not present

## 2016-08-17 DIAGNOSIS — D649 Anemia, unspecified: Secondary | ICD-10-CM | POA: Diagnosis not present

## 2016-08-17 DIAGNOSIS — I739 Peripheral vascular disease, unspecified: Secondary | ICD-10-CM | POA: Insufficient documentation

## 2016-08-17 HISTORY — DX: Calculus of kidney: N20.0

## 2016-08-17 LAB — COMPREHENSIVE METABOLIC PANEL
ALBUMIN: 2.9 g/dL — AB (ref 3.5–5.0)
ALT: 18 U/L (ref 17–63)
ANION GAP: 7 (ref 5–15)
AST: 28 U/L (ref 15–41)
Alkaline Phosphatase: 64 U/L (ref 38–126)
BILIRUBIN TOTAL: 0.4 mg/dL (ref 0.3–1.2)
BUN: 23 mg/dL — AB (ref 6–20)
CHLORIDE: 113 mmol/L — AB (ref 101–111)
CO2: 21 mmol/L — ABNORMAL LOW (ref 22–32)
Calcium: 9.6 mg/dL (ref 8.9–10.3)
Creatinine, Ser: 1.22 mg/dL (ref 0.61–1.24)
GFR calc Af Amer: >60 mL/min (ref >60)
GFR calc non Af Amer: 56 mL/min — ABNORMAL LOW (ref >60)
GLUCOSE: 191 mg/dL — AB (ref 65–99)
POTASSIUM: 4.1 mmol/L (ref 3.5–5.1)
Sodium: 141 mmol/L (ref 135–145)
TOTAL PROTEIN: 7 g/dL (ref 6.5–8.1)

## 2016-08-17 LAB — CBC
HCT: 30.8 % — ABNORMAL LOW (ref 39.0–52.0)
Hemoglobin: 10.3 g/dL — ABNORMAL LOW (ref 13.0–17.0)
MCH: 31 pg (ref 26.0–34.0)
MCHC: 33.4 g/dL (ref 30.0–36.0)
MCV: 92.8 fL (ref 78.0–100.0)
PLATELETS: 201 10*3/uL (ref 150–400)
RBC: 3.32 MIL/uL — ABNORMAL LOW (ref 4.22–5.81)
RDW: 14.5 % (ref 11.5–15.5)
WBC: 10.7 10*3/uL — AB (ref 4.0–10.5)

## 2016-08-17 LAB — HEMOGLOBIN A1C
Hgb A1c MFr Bld: 6.5 % — ABNORMAL HIGH (ref 4.8–5.6)
Mean Plasma Glucose: 140 mg/dL

## 2016-08-17 LAB — BLOOD GAS, ARTERIAL
ACID-BASE DEFICIT: 2.9 mmol/L — AB (ref 0.0–2.0)
BICARBONATE: 21.3 mmol/L (ref 20.0–28.0)
DRAWN BY: 206361
FIO2: 0.21
O2 SAT: 96.5 %
PATIENT TEMPERATURE: 98.6
PH ART: 7.386 (ref 7.350–7.450)
pCO2 arterial: 36.3 mmHg (ref 32.0–48.0)
pO2, Arterial: 87.1 mmHg (ref 83.0–108.0)

## 2016-08-17 LAB — URINE MICROSCOPIC-ADD ON

## 2016-08-17 LAB — URINALYSIS, ROUTINE W REFLEX MICROSCOPIC
BILIRUBIN URINE: NEGATIVE
Glucose, UA: NEGATIVE mg/dL
Hgb urine dipstick: NEGATIVE
Ketones, ur: NEGATIVE mg/dL
LEUKOCYTES UA: NEGATIVE
NITRITE: NEGATIVE
Protein, ur: 30 mg/dL — AB
Specific Gravity, Urine: 1.018 (ref 1.005–1.030)
pH: 5.5 (ref 5.0–8.0)

## 2016-08-17 LAB — SURGICAL PCR SCREEN
MRSA, PCR: NEGATIVE
Staphylococcus aureus: NEGATIVE

## 2016-08-17 LAB — GLUCOSE, CAPILLARY: GLUCOSE-CAPILLARY: 185 mg/dL — AB (ref 65–99)

## 2016-08-17 NOTE — Pre-Procedure Instructions (Addendum)
[name]  08/17/2016      [facility] [address] [place] [address] Phone: [phone] Fax: [phone]  [facility] [place],  - [address] AT [facility] [address] [place] [address] Phone: [phone] Fax: [phone]    Your procedure is scheduled on Wednesday October 11  Report to [facility] at [employer] A.M.  Call this number if you have problems the morning of surgery:  [phone]   Remember:  Do not eat food or drink liquids after midnight.   Take these medicines the morning of surgery with A SIP OF WATER amLODipine (NORVASC), cloNIDine (CATAPRES), metoprolol tartrate,  mometasone-formoterol (DULERA), oxyCODONE,  pantoprazole (PROTONIX), propafenone (RYTHMOL), tamsulosin (FLOMAX)     7 days prior to surgery STOP taking any Aspirin, Aleve, Naproxen, Ibuprofen, Motrin, Advil, Goody's, BC's, all herbal medications, fish oil, and all vitamins      WHAT DO I DO ABOUT MY DIABETES MEDICATION?   Marland Kitchen Do not take oral diabetes medicines (pills) the morning of surgery. saxagliptin HCl (ONGLYZA) and glipiZIDE (GLUCOTROL)  ONLY TAKE MORNING AND LUNCH DOSE OF glipiZIDE (GLUCOTROL) THE DAY BEFORE SURGERY      How to Manage Your Diabetes Before and After Surgery  Why is it important to control my blood sugar before and after surgery? . Improving blood sugar levels before and after surgery helps healing and can limit problems. . A way of improving blood sugar control is eating a healthy diet by: o  Eating less sugar and carbohydrates o  Increasing activity/exercise o  Talking with your doctor about reaching your blood sugar goals . High blood sugars (greater than 180 mg/dL) can raise your risk of infections and slow your recovery, so you will need to focus on controlling your diabetes during the weeks before surgery. . Make sure that the doctor who takes  care of your diabetes knows about your planned surgery including the date and location.  How do I manage my blood sugar before surgery? . Check your blood sugar at least 4 times a day, starting 2 days before surgery, to make sure that the level is not too high or low. o Check your blood sugar the morning of your surgery when you wake up and every 2 hours until you get to the Short Stay unit. . If your blood sugar is less than 70 mg/dL, you will need to treat for low blood sugar: o Do not take insulin. o Treat a low blood sugar (less than 70 mg/dL) with  cup of clear juice (cranberry or apple), 4 glucose tablets, OR glucose gel. o Recheck blood sugar in 15 minutes after treatment (to make sure it is greater than 70 mg/dL). If your blood sugar is not greater than 70 mg/dL on recheck, call [phone] for further instructions. . Report your blood sugar to the short stay nurse when you get to Short Stay.  . If you are admitted to the hospital after surgery: o Your blood sugar will be checked by the staff and you will probably be given insulin after surgery (instead of oral diabetes medicines) to make sure you have good blood sugar levels. o The goal for blood sugar control after surgery is 80-180 mg/dL.       Do not wear jewelry..  Do not wear lotions, powders, or perfumes, or deoderant.  Men may shave face and neck.  Do  not bring valuables to the hospital.  [facility] is not responsible for any belongings or valuables.  Contacts, dentures or bridgework may not be worn into surgery.  Leave your suitcase in the car.  After surgery it may be brought to your room.  For patients admitted to the hospital, discharge time will be determined by your treatment team.  Patients discharged the day of surgery will not be allowed to drive home.    Special instructions:   [facility]- Preparing For Surgery  Before surgery, you can play an important role. Because skin is not sterile, your skin  needs to be as free of germs as possible. You can reduce the number of germs on your skin by washing with CHG (chlorahexidine gluconate) Soap before surgery.  CHG is an antiseptic cleaner which kills germs and bonds with the skin to continue killing germs even after washing.  Please do not use if you have an allergy to CHG or antibacterial soaps. If your skin becomes reddened/irritated stop using the CHG.  Do not shave (including legs and underarms) for at least 48 hours prior to first CHG shower. It is OK to shave your face.  Please follow these instructions carefully.   1. Shower the NIGHT BEFORE SURGERY and the MORNING OF SURGERY with CHG.   2. If you chose to wash your hair, wash your hair first as usual with your normal shampoo.  3. After you shampoo, rinse your hair and body thoroughly to remove the shampoo.  4. Use CHG as you would any other liquid soap. You can apply CHG directly to the skin and wash gently with a scrungie or a clean washcloth.   5. Apply the CHG Soap to your body ONLY FROM THE NECK DOWN.  Do not use on open wounds or open sores. Avoid contact with your eyes, ears, mouth and genitals (private parts). Wash genitals (private parts) with your normal soap.  6. Wash thoroughly, paying special attention to the area where your surgery will be performed.  7. Thoroughly rinse your body with warm water from the neck down.  8. DO NOT shower/wash with your normal soap after using and rinsing off the CHG Soap.  9. Pat yourself dry with a CLEAN TOWEL.   10. Wear CLEAN PAJAMAS   11. Place CLEAN SHEETS on your bed the night of your first shower and DO NOT SLEEP WITH PETS.    Day of Surgery: Do not apply any deodorants/lotions. Please wear clean clothes to the hospital/surgery center.      Please read over the following fact sheets that you were given. Coughing and Deep Breathing, Blood Transfusion Information, MRSA Information and Surgical Site Infection  Prevention

## 2016-08-17 NOTE — Progress Notes (Signed)
Spoke with Okey Regalarol at Dr. Coralee PesaBrabhams office about  Dr. Kandis BanGherhart working with Dr. Myra GianottiBrabham on Mr. Donnetta HailNeal's srugery.  Explained to Okey RegalCarol that we did not have orders for dr. Kandis BanGherhart and needed to get those orders in Epic.  She stated that she would call Dr. Garnette ScheuermannGherhart's office

## 2016-08-17 NOTE — Progress Notes (Signed)
PCP - Brent BullaMane Hepler Cardiologist - Kelley  Chest x-ray - 08/17/16 EKG - 07/23/16 - abnormal Stress Test - denies ECHO - 02/04/11 Cardiac Cath - > 20 years    Patient denies shortness of breath, fever, and chest pain at PAT appointment  Patient has a cough.    Patient instructed to stop warfarin and cilostazoll after 10/5 Starting Lovenox on 10-6  Was instructed to continue baby aspirin

## 2016-08-19 NOTE — Progress Notes (Signed)
Anesthesia Chart Review:  Pt is a 75 year old male scheduled for thoracic aortic endovascular stent graft with retroperitoneal exposure on 09/04/2016 with [name] ElseVance Brabham, MD.  Notes indicate [name] PlaneEdward Gerhardt, MD will be assisting with surgery.   - PCP is [name] KimMark Hepler, PA - Cardiologist is [name] Guadalajarahomas Kelly, MD; pt was last seen by [name] Demarkhonda Barrett, PA on 07/23/16 who cleared pt for surgery at high risk (notes indicate there is nothing to be done to ameliorate risk).   PMH includes:  Atrial fibrillation, PAD (s/p FFBG 1991; redo patch angioplasty 2000, 2004; s/p aorto-FPBG 2007), stroke, HTN, DM, hyperlipidemia, carotid artery stenosis (s/p CEA 2002, 2004), AAA, COPD, DVT, anemia, GERD.  Current heavy smoker. BMI 21  Medications include: amlodipine, ASA, lipitor, pletal, clonidine, lovenox, iron, glipizide, losartan, metoprolol, dulera, protonix, propadenone, saxagliptin, coumadin. Pt to stop coumadin and pletal after 10/5 and start lovenox 10/6.   Preoperative labs reviewed.   - HgbA1c 6.5, glucose 191.   - PT/PTT will be obtained DOS  CXR 08/17/16:  - COPD and pulmonary fibrotic change. Mild interval increase in lower lobe interstitial markings on the left may reflect atelectasis, early pneumonia, or bronchiectatic changes. Followup PA and lateral chest X-ray is recommended in 3-4 weeks following trial of antibiotic therapy to ensure resolution and exclude underlying malignancy. - Aortic atherosclerosis. - I notified [name] in Dr. Estanislado SpireBrabham's office of abnormal CXR results  EKG 07/23/16: sinus rhythm. RBBB.   Carotid duplex 07/20/16:  - Patent L CEA site.  - B ICA stenosis 1-39%  Nuclear stress test 02/04/11: Negative for ischemia. LV systolic function normal. EF 56%. Low risk scan  Echo 02/04/11:  - LV normal in size. Mild concentric LVH. EF >55%. No regional wall motion abnormalities noted. Grade I diastolic dysfunction - RV is normal in size and function - LA mildly dilated - Dilated inferior vena  cava suggests increased RA pressure. IVC collapse >50%. CVP estimated at 10-15 mmHg.  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 No evidence of atrial septal defect - Mild mitral annular calcification with no restriction of leaflet excursion - Aortic valve appears to be mildly sclerotic with no stenosis - Aortic root sclerosis/calcification  If no changes, I anticipate pt can proceed with surgery as scheduled.   [name] Mastngela Yaretzi Ernandez, FNP-BC [facility] Phone: [phone] 08/19/2016 4:17 PM

## 2016-08-23 ENCOUNTER — Ambulatory Visit (INDEPENDENT_AMBULATORY_CARE_PROVIDER_SITE_OTHER)
Admission: RE | Admit: 2016-08-23 | Discharge: 2016-08-23 | Disposition: A | Discharge status: Home / Self Care | Payer: Medicare Other | Source: Ambulatory Visit | Attending: Acute Care | Admitting: Acute Care

## 2016-08-23 ENCOUNTER — Ambulatory Visit (INDEPENDENT_AMBULATORY_CARE_PROVIDER_SITE_OTHER): Payer: Medicare Other | Admitting: Acute Care

## 2016-08-23 ENCOUNTER — Encounter: Payer: Self-pay | Admitting: Acute Care

## 2016-08-23 VITALS — BP 142/68 | HR 73 | Ht 69.0 in | Wt 132.0 lb

## 2016-08-23 DIAGNOSIS — F17210 Nicotine dependence, cigarettes, uncomplicated: Secondary | ICD-10-CM | POA: Diagnosis not present

## 2016-08-23 DIAGNOSIS — J449 Chronic obstructive pulmonary disease, unspecified: Secondary | ICD-10-CM | POA: Diagnosis not present

## 2016-08-23 DIAGNOSIS — I779 Disorder of arteries and arterioles, unspecified: Secondary | ICD-10-CM

## 2016-08-23 MED ORDER — LEVOFLOXACIN 750 MG PO TABS: 750.0000 mg | ORAL_TABLET | Freq: Every day | ORAL | 0 refills | Status: AC

## 2016-08-23 NOTE — Progress Notes (Addendum)
History of Present Illness [name] is a 75 y.o. male active heavy smoker with COPD, AAA followed by Dr. Isaiah Serge.   08/23/2016 OV for Surgical Clearance for AAA Surgery scheduled 08/27/2016: Pt was cleared for surgery by Dr. Isaiah Serge on 07/13/16. The chest x ray done 08/17/2016 as a pre-op study indicated left lower lobe atelectasis, early pneumonia or bronchiectatic changes.Patient was asked to return to the pulmonary office by Dr. Myra Gianotti  to reestablish pulmonary clearance based on this CXR current finding. The patient does not have any clinical symptoms of infection. He denies coughing, fever, chest pain, purulent secretions, orthopnea or  hemoptysis . He denies shortness of breath.   He is clinically asymptomatic for any upper airway issues or pneumonia  today in the office. He did state that on 08/17/2016 he did have a slight cough with green mucus esp in am  which has subsequently resolved. He is compliant with his Dulera twice daily, and Pro Air for rescue. We will repeat chest x-ray today, and treat with Levaquin 3 days to ensure optimized pulmonary status prior to surgery on 09/01/2016. Recommendation for Post op early mobilization, pain control and , DVT prophylaxis. Counseled patient on smoking cessation today.  X-Rays were reviewed with Dr. Sandrea Hughs, who also assessed the patient.  Chest x-ray: 08/23/2016 IMPRESSION: Continued improvement in left lower lobe a densities. No new focal abnormality is seen.  CXR: 08/17/2016: IMPRESSION: COPD and pulmonary fibrotic change. Mild interval increase in lower lobe interstitial markings on the left may reflect atelectasis, early pneumonia, or bronchiectatic changes. Followup PA and lateral chest X-ray is recommended in 3-4 weeks following trial of antibiotic therapy to ensure resolution and exclude underlying malignancy.  Tests CT abdomen 03/26/16 - cholelithiasis, choledocholithiasis, patchy lower lobe opacities. CT chest 04/26/16-  progression of lower lobe opacities with new masslike consolidation in bilateral lower lobes.  CT chest 07/05/16- Decrease in lower lobe consolidations suggestive of infectious/inflammatory changes. 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 Thoracic aortic aneurysm 5.7 cm. Images reviewed.   PFTs 07/12/16 FVC 2.50 (91%) FEV1 1.48 (50%) F/F 42 TLC 121% RV/TLC 148% ERV 139%. DLCO 31% Moderately severe obstructive airway disease, hyperinflation Reduced diffusion capacity, air trapping Truncated flow volume loop, however this may be effort dependent.   Past medical hx Past Medical History:  Diagnosis Date  . AAA (abdominal aortic aneurysm) (HCC)   . Anemia   . Anxiety   . Arthritis    Gout  . Atrial fibrillation (HCC)   . Barrett esophagus   . Carotid artery occlusion    Carotid endarterectomy 2002 and 2004  . COPD (chronic obstructive pulmonary disease) (HCC)   . Diabetes mellitus   . Diverticulosis   . DVT (deep venous thrombosis) (HCC)   . Erectile dysfunction   . GERD (gastroesophageal reflux disease)   . Gout   . Hiatal hernia   . History of stress test    March 2012 post stress ejection fraction 56%, negative for ischemia.  Last 2-D echocardiogram March 2012 younger than 55% mild concentric left ventricular hypertrophy grade 1 diastolic dysfunction. Mildly dilated left atrium. 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  . Hyperlipidemia   . Hypertension   . Kidney stone   . Lumbar stenosis   . PAD (peripheral artery disease) (HCC)    Fem-Fem by-pass graft 1991. redo patch angioplasty in 2000 and 2004.  LEA dopplers: 04/2012- R-ABI 1.34, L-ABI 0.97, patent Left lower extremity graft.  . Pancreatitis   .  Stroke [facility]) March 2016   [facility] Stroke     Past surgical hx, Family hx, Social hx all reviewed.  Current Outpatient Prescriptions on File Prior to Visit  Medication Sig  . ALPRAZolam (XANAX) 0.5 MG tablet Take 0.25-0.5 mg by mouth at  bedtime as needed for anxiety.   Marland Kitchen amLODipine (NORVASC) 5 MG tablet Take 5 mg by mouth daily with supper.   Marland Kitchen aspirin EC 81 MG tablet Take 81 mg by mouth every evening.  Marland Kitchen atorvastatin (LIPITOR) 80 MG tablet Take 40 mg by mouth every evening.   . cilostazol (PLETAL) 100 MG tablet Take 100 mg by mouth 2 (two) times daily at 8 am and 10 pm.   . cloNIDine (CATAPRES) 0.3 MG tablet Take 0.1 mg by mouth 3 (three) times daily.   Marland Kitchen docusate sodium (COLACE) 100 MG capsule Take 200 mg by mouth 2 (two) times daily as needed for mild constipation.   . enoxaparin (LOVENOX) 100 MG/ML injection Inject 100 mg into the skin daily at 6 PM.  . Ferrous Fumarate-Folic Acid (FERROCITE F PO) Take 1 capsule by mouth daily before lunch.   Marland Kitchen glipiZIDE (GLUCOTROL) 5 MG tablet Take 5 mg by mouth 2 (two) times daily before a meal.  . losartan (COZAAR) 100 MG tablet Take 100 mg by mouth daily with lunch.   . metoprolol tartrate (LOPRESSOR) 25 MG tablet TAKE 1 TABLET BY MOUTH TWICE DAILY  . mometasone-formoterol (DULERA) 200-5 MCG/ACT AERO Inhale 2 puffs into the lungs 2 (two) times daily.  Marland Kitchen omega-3 acid ethyl esters (LOVAZA) 1 G capsule Take 2 g by mouth 2 (two) times daily.   Marland Kitchen oxyCODONE 20 MG TABS Take 1 tablet (20 mg total) by mouth every 8 (eight) hours as needed for moderate pain.  . pantoprazole (PROTONIX) 40 MG tablet Take 40 mg by mouth 2 (two) times daily.   . propafenone (RYTHMOL) 225 MG tablet Take 225 mg by mouth 2 (two) times daily.  Marland Kitchen Respiratory Therapy Supplies (FLUTTER) DEVI Use as directed.  . saxagliptin HCl (ONGLYZA) 2.5 MG TABS tablet Take 2.5 mg by mouth daily.  . tamsulosin (FLOMAX) 0.4 MG CAPS capsule Take 0.4 mg by mouth daily as needed (for urinary difficulty).   Marland Kitchen testosterone cypionate (DEPOTESTOTERONE CYPIONATE) 200 MG/ML injection Inject 100 mg into the skin every 14 (fourteen) days.   . Vitamin D, Ergocalciferol, (DRISDOL) 50000 UNITS CAPS capsule Take 50,000 Units by mouth every 7 (seven)  days. Wednesday  . Warfarin Sodium (COUMADIN PO) Take 4-6 mg by mouth as directed. 4 mg daily with the exception of 6 mg on Fridays   No current facility-administered medications on file prior to visit.      Allergies  Allergen Reactions  . Lopressor [Metoprolol Tartrate] Palpitations    LOPRESSOR bothers the patient.  . Niacin     Other reaction(s): Facial Edema (intolerance)    Review Of Systems:  Constitutional:   No  weight loss, night sweats,  Fevers, chills, fatigue, or  lassitude.  HEENT:   No headaches,  Difficulty swallowing,  Tooth/dental problems, or  Sore throat,                No sneezing, itching, ear ache, nasal congestion, post nasal drip,   CV:  No chest pain,  Orthopnea, PND, swelling in lower extremities, anasarca, dizziness, palpitations, syncope.   GI  No heartburn, indigestion, abdominal pain, nausea, vomiting, diarrhea, change in bowel habits, loss of appetite, bloody stools.   Resp: No  shortness of breath with exertion or at rest.  No excess mucus, no productive cough,  No non-productive cough,  No coughing up of blood.  No change in color of mucus.  No wheezing.  No chest wall deformity  Skin: no rash or lesions.  GU: no dysuria, change in color of urine, no urgency or frequency.  No flank pain, no hematuria   MS:  No joint pain or swelling.  No decreased range of motion.  No back pain.  Psych:  No change in mood or affect. No depression or anxiety.  No memory loss.   Vital Signs BP (!) 142/68 (BP Location: Right Arm, Cuff Size: Normal)   Pulse 73   Ht 5\' 9"  (1.753 m)   Wt 132 lb (59.9 kg)   SpO2 97%   BMI 19.49 kg/m   FIO2  RA    Physical Exam:  Wt Readings from Last 3 Encounters:  08/23/16 132 lb (59.9 kg)  08/17/16 136 lb 12.8 oz (62.1 kg)  07/23/16 136 lb 9.6 oz (62 kg)      General- No distress,  A&Ox3, pleasant ENT: No sinus tenderness, TM clear, pale nasal mucosa, no oral exudate,no post nasal drip, no LAN Cardiac: S1, S2,  regular rate and rhythm, no murmur Chest: No wheeze/ rales/ diminished per bases bilaterally, no accessory muscle use, no nasal flaring, no sternal retractions Abd.: Soft Non-tender Ext: No clubbing cyanosis, edema Neuro:  normal strength Skin: No rashes, warm and dry Psych: normal mood and behavior   CXR PA and Lateral:   08/23/2016 :    I personally reviewed images and agree with radiology impression as follows:    Continued improvement in left lower lobe a densities. No new focal abnormality is seen.  Assessment/Plan  COPD (chronic obstructive pulmonary disease) Re-evaluation for pulmonary surgical clearance for AAA 09-16-16 Chest x-ray done 08/17/2016 indicated left lower lobe atelectasis/pneumonia/bronchiectasis Plan Levaquin 750 x 3 days Repeat CXR today Recommendation for Post op early mobilization, pain control and , DVT prophylaxis.  Counseled patient on smoking cessation. Continue Dulera200 2 Take 2 puffs first thing in am and then another 2 puffs about 12 hours later.  Continue use of Pro Air for rescue as needed up to every 4 hours  Continue flutter valve for mucociliary clearance twice daily Follow up with Dr. Isaiah Serge in 1 month  Please contact office for sooner follow up if symptoms do not improve or worsen or seek emergency care    Cigarette  smoker > 3 min discussion  Discussed the risks and costs (both direct and indirect)  of smoking relative to the benefits of quitting but patient unwilling to commit at this point to a specific quit date.         He has mild bronchitis clinically and not enough to preclude surgery/  Unable / unwilling to quit smoking until admit so faces a higher risk level for post op pulmonary complications but appears to understand/ accept risk so rec go ahead as planned   Total time devoted to counseling  = 25/66m review case with pt/wife  discussion of options/alternatives/ personally creating written instructions  in presence of pt   then going over those specific  Instructions directly with the pt including how to use all of the meds but in particular covering each new medication in detail and the difference between the maintenance/automatic meds and the prns using an action plan format for the latter.     Sandrea Hughs, MD Pulmonary and  Critical Care Medicine [facility] Cell [phone] After 5:30 PM or weekends, call [phone]

## 2016-08-23 NOTE — Assessment & Plan Note (Addendum)
Re-evaluation for pulmonary surgical clearance for AAA 07/04/16 Chest x-ray done 08/17/2016 indicated left lower lobe atelectasis/pneumonia/bronchiectasis Plan Levaquin 750 x 3 days Repeat CXR today Recommendation for Post op early mobilization, pain control and , DVT prophylaxis.  Counseled patient on smoking cessation. Continue Dulera twice daily Continue use of Pro Air for rescue as needed Continue flutter valve for mucociliary clearance twice daily Follow up with Dr. Isaiah SergeMannam in 1 month  Please contact office for sooner follow up if symptoms do not improve or worsen or seek emergency care

## 2016-08-23 NOTE — Patient Instructions (Addendum)
It is nice to meet you today. Levaquin 750 x 3 days Repeat CXR today Recommendation for Post op early mobilization, pain control and , DVT prophylaxis.  Counseled patient on smoking cessation. Follow up with Dr. Isaiah SergeMannam in 1 month  Please contact office for sooner follow up if symptoms do not improve or worsen or seek emergency care

## 2016-08-24 NOTE — Anesthesia Preprocedure Evaluation (Addendum)
Anesthesia Evaluation  Patient identified by MRN, date of birth, ID band Patient awake    Reviewed: Allergy & Precautions, H&P , NPO status , Patient's Chart, lab work & pertinent test results  History of Anesthesia Complications Negative for: history of anesthetic complications  Airway Mallampati: I  TM Distance: >3 FB Neck ROM: full    Dental  (+) Edentulous Lower, Edentulous Upper   Pulmonary COPD, Current Smoker,  Significant smoking history   + rhonchi  + decreased breath sounds      Cardiovascular hypertension, + Peripheral Vascular Disease  Normal cardiovascular exam Rhythm:regular Rate:Normal  EF is 55%.. Has cardiology clearance for procedure as stress test would not change management   Neuro/Psych Anxiety  Neuromuscular disease CVA    GI/Hepatic Neg liver ROS, hiatal hernia, GERD  ,  Endo/Other  diabetes  Renal/GU Renal disease     Musculoskeletal  (+) Arthritis ,   Abdominal   Peds  Hematology  (+) anemia ,   Anesthesia Other Findings [name] is very thin, he has had bilateral cephalic veins removed on his arms, arterial line acquired using ultrasound given significant calcification and small size of radial vessel  Reproductive/Obstetrics negative OB ROS                            Anesthesia Physical Anesthesia Plan  ASA: IV  Anesthesia Plan: General   Post-op Pain Management:    Induction: Intravenous  Airway Management Planned: Oral ETT  Additional Equipment: Arterial line, CVP, PA Cath and Ultrasound Guidance Line Placement  Intra-op Plan:   Post-operative Plan: Possible Post-op intubation/ventilation  Informed Consent: I have reviewed the patients History and Physical, chart, labs and discussed the procedure including the risks, benefits and alternatives for the proposed anesthesia with the patient or authorized representative who has indicated his/her  understanding and acceptance.   Dental advisory given  Plan Discussed with: Anesthesiologist, CRNA and Surgeon  Anesthesia Plan Comments: (Patient has seen cardiology and no reason to get a stress test given that it would not change management although he remains high risk for peroperative MACE from needed procedure. GA with ETT, large bore IV on right arm and left if possible, right A line and RIJ Cordis with PAC planned. Also Rec precedex infusion during case. Please have phenylephrine, Norepinephrine, and nitroglycerin available in room. WIll need Type and Screen active. )       Anesthesia Quick Evaluation

## 2016-08-25 ENCOUNTER — Inpatient Hospital Stay (HOSPITAL_COMMUNITY)
Admission: RE | Admit: 2016-08-25 | Discharge: 2016-09-15 | DRG: 219 | Disposition: E | Payer: Medicare Other | Source: Ambulatory Visit | Attending: Surgery | Admitting: Surgery

## 2016-08-25 ENCOUNTER — Inpatient Hospital Stay (HOSPITAL_COMMUNITY): Payer: Medicare Other | Admitting: Anesthesiology

## 2016-08-25 ENCOUNTER — Encounter (HOSPITAL_COMMUNITY): Admission: RE | Disposition: E | Payer: Self-pay | Source: Ambulatory Visit | Attending: Surgery

## 2016-08-25 ENCOUNTER — Inpatient Hospital Stay (HOSPITAL_COMMUNITY): Payer: Medicare Other

## 2016-08-25 ENCOUNTER — Encounter (HOSPITAL_COMMUNITY): Payer: Self-pay | Admitting: *Deleted

## 2016-08-25 ENCOUNTER — Inpatient Hospital Stay (HOSPITAL_COMMUNITY): Payer: Medicare Other | Admitting: Emergency Medicine

## 2016-08-25 DIAGNOSIS — E876 Hypokalemia: Secondary | ICD-10-CM | POA: Diagnosis present

## 2016-08-25 DIAGNOSIS — E872 Acidosis: Secondary | ICD-10-CM | POA: Diagnosis present

## 2016-08-25 DIAGNOSIS — R140 Abdominal distension (gaseous): Secondary | ICD-10-CM | POA: Diagnosis not present

## 2016-08-25 DIAGNOSIS — I2510 Atherosclerotic heart disease of native coronary artery without angina pectoris: Secondary | ICD-10-CM | POA: Diagnosis present

## 2016-08-25 DIAGNOSIS — Z681 Body mass index (BMI) 19 or less, adult: Secondary | ICD-10-CM

## 2016-08-25 DIAGNOSIS — J690 Pneumonitis due to inhalation of food and vomit: Secondary | ICD-10-CM | POA: Diagnosis not present

## 2016-08-25 DIAGNOSIS — R001 Bradycardia, unspecified: Secondary | ICD-10-CM | POA: Diagnosis not present

## 2016-08-25 DIAGNOSIS — J189 Pneumonia, unspecified organism: Secondary | ICD-10-CM

## 2016-08-25 DIAGNOSIS — K567 Ileus, unspecified: Secondary | ICD-10-CM

## 2016-08-25 DIAGNOSIS — K219 Gastro-esophageal reflux disease without esophagitis: Secondary | ICD-10-CM | POA: Diagnosis present

## 2016-08-25 DIAGNOSIS — M48061 Spinal stenosis, lumbar region without neurogenic claudication: Secondary | ICD-10-CM | POA: Diagnosis present

## 2016-08-25 DIAGNOSIS — R000 Tachycardia, unspecified: Secondary | ICD-10-CM | POA: Diagnosis present

## 2016-08-25 DIAGNOSIS — F17210 Nicotine dependence, cigarettes, uncomplicated: Secondary | ICD-10-CM | POA: Diagnosis present

## 2016-08-25 DIAGNOSIS — R400 Somnolence: Secondary | ICD-10-CM | POA: Diagnosis not present

## 2016-08-25 DIAGNOSIS — D62 Acute posthemorrhagic anemia: Secondary | ICD-10-CM | POA: Diagnosis not present

## 2016-08-25 DIAGNOSIS — I129 Hypertensive chronic kidney disease with stage 1 through stage 4 chronic kidney disease, or unspecified chronic kidney disease: Secondary | ICD-10-CM | POA: Diagnosis present

## 2016-08-25 DIAGNOSIS — I469 Cardiac arrest, cause unspecified: Secondary | ICD-10-CM | POA: Diagnosis not present

## 2016-08-25 DIAGNOSIS — E785 Hyperlipidemia, unspecified: Secondary | ICD-10-CM | POA: Diagnosis present

## 2016-08-25 DIAGNOSIS — J9811 Atelectasis: Secondary | ICD-10-CM | POA: Diagnosis not present

## 2016-08-25 DIAGNOSIS — Z7901 Long term (current) use of anticoagulants: Secondary | ICD-10-CM

## 2016-08-25 DIAGNOSIS — N183 Chronic kidney disease, stage 3 (moderate): Secondary | ICD-10-CM | POA: Diagnosis present

## 2016-08-25 DIAGNOSIS — R059 Cough, unspecified: Secondary | ICD-10-CM

## 2016-08-25 DIAGNOSIS — Z9841 Cataract extraction status, right eye: Secondary | ICD-10-CM

## 2016-08-25 DIAGNOSIS — I700 Atherosclerosis of aorta: Secondary | ICD-10-CM | POA: Diagnosis present

## 2016-08-25 DIAGNOSIS — J9690 Respiratory failure, unspecified, unspecified whether with hypoxia or hypercapnia: Secondary | ICD-10-CM

## 2016-08-25 DIAGNOSIS — T17908A Unspecified foreign body in respiratory tract, part unspecified causing other injury, initial encounter: Secondary | ICD-10-CM | POA: Diagnosis not present

## 2016-08-25 DIAGNOSIS — I160 Hypertensive urgency: Secondary | ICD-10-CM | POA: Diagnosis not present

## 2016-08-25 DIAGNOSIS — E1151 Type 2 diabetes mellitus with diabetic peripheral angiopathy without gangrene: Secondary | ICD-10-CM | POA: Diagnosis present

## 2016-08-25 DIAGNOSIS — I712 Thoracic aortic aneurysm, without rupture, unspecified: Secondary | ICD-10-CM | POA: Diagnosis present

## 2016-08-25 DIAGNOSIS — Z809 Family history of malignant neoplasm, unspecified: Secondary | ICD-10-CM

## 2016-08-25 DIAGNOSIS — R05 Cough: Secondary | ICD-10-CM

## 2016-08-25 DIAGNOSIS — Z9842 Cataract extraction status, left eye: Secondary | ICD-10-CM

## 2016-08-25 DIAGNOSIS — R4182 Altered mental status, unspecified: Secondary | ICD-10-CM

## 2016-08-25 DIAGNOSIS — G9340 Encephalopathy, unspecified: Secondary | ICD-10-CM

## 2016-08-25 DIAGNOSIS — L8990 Pressure ulcer of unspecified site, unspecified stage: Secondary | ICD-10-CM | POA: Insufficient documentation

## 2016-08-25 DIAGNOSIS — J9601 Acute respiratory failure with hypoxia: Secondary | ICD-10-CM | POA: Diagnosis not present

## 2016-08-25 DIAGNOSIS — R509 Fever, unspecified: Secondary | ICD-10-CM | POA: Diagnosis not present

## 2016-08-25 DIAGNOSIS — E8770 Fluid overload, unspecified: Secondary | ICD-10-CM | POA: Diagnosis not present

## 2016-08-25 DIAGNOSIS — Z8249 Family history of ischemic heart disease and other diseases of the circulatory system: Secondary | ICD-10-CM

## 2016-08-25 DIAGNOSIS — J90 Pleural effusion, not elsewhere classified: Secondary | ICD-10-CM | POA: Diagnosis present

## 2016-08-25 DIAGNOSIS — I4891 Unspecified atrial fibrillation: Secondary | ICD-10-CM | POA: Diagnosis present

## 2016-08-25 DIAGNOSIS — Z8679 Personal history of other diseases of the circulatory system: Secondary | ICD-10-CM

## 2016-08-25 DIAGNOSIS — N170 Acute kidney failure with tubular necrosis: Secondary | ICD-10-CM | POA: Diagnosis not present

## 2016-08-25 DIAGNOSIS — E1140 Type 2 diabetes mellitus with diabetic neuropathy, unspecified: Secondary | ICD-10-CM | POA: Diagnosis present

## 2016-08-25 DIAGNOSIS — Z452 Encounter for adjustment and management of vascular access device: Secondary | ICD-10-CM | POA: Diagnosis not present

## 2016-08-25 DIAGNOSIS — G9341 Metabolic encephalopathy: Secondary | ICD-10-CM | POA: Diagnosis not present

## 2016-08-25 DIAGNOSIS — Z8673 Personal history of transient ischemic attack (TIA), and cerebral infarction without residual deficits: Secondary | ICD-10-CM

## 2016-08-25 DIAGNOSIS — M109 Gout, unspecified: Secondary | ICD-10-CM | POA: Diagnosis present

## 2016-08-25 DIAGNOSIS — Z9689 Presence of other specified functional implants: Secondary | ICD-10-CM

## 2016-08-25 DIAGNOSIS — E1122 Type 2 diabetes mellitus with diabetic chronic kidney disease: Secondary | ICD-10-CM | POA: Diagnosis present

## 2016-08-25 DIAGNOSIS — Z978 Presence of other specified devices: Secondary | ICD-10-CM

## 2016-08-25 DIAGNOSIS — E43 Unspecified severe protein-calorie malnutrition: Secondary | ICD-10-CM | POA: Diagnosis present

## 2016-08-25 DIAGNOSIS — K2270 Barrett's esophagus without dysplasia: Secondary | ICD-10-CM | POA: Diagnosis present

## 2016-08-25 DIAGNOSIS — E861 Hypovolemia: Secondary | ICD-10-CM | POA: Diagnosis present

## 2016-08-25 DIAGNOSIS — I7123 Aneurysm of the descending thoracic aorta, without rupture: Secondary | ICD-10-CM

## 2016-08-25 DIAGNOSIS — Z833 Family history of diabetes mellitus: Secondary | ICD-10-CM

## 2016-08-25 DIAGNOSIS — K449 Diaphragmatic hernia without obstruction or gangrene: Secondary | ICD-10-CM | POA: Diagnosis present

## 2016-08-25 DIAGNOSIS — Z9889 Other specified postprocedural states: Secondary | ICD-10-CM

## 2016-08-25 DIAGNOSIS — R1115 Cyclical vomiting syndrome unrelated to migraine: Secondary | ICD-10-CM

## 2016-08-25 DIAGNOSIS — E1165 Type 2 diabetes mellitus with hyperglycemia: Secondary | ICD-10-CM | POA: Diagnosis present

## 2016-08-25 HISTORY — PX: THORACIC AORTIC ENDOVASCULAR STENT GRAFT: SHX6112

## 2016-08-25 LAB — POCT I-STAT 3, ART BLOOD GAS (G3+)
Acid-base deficit: 1 mmol/L (ref 0.0–2.0)
Bicarbonate: 23.7 mmol/L (ref 20.0–28.0)
O2 SAT: 94 %
PCO2 ART: 37.5 mmHg (ref 32.0–48.0)
PO2 ART: 66 mmHg — AB (ref 83.0–108.0)
Patient temperature: 36.1
TCO2: 25 mmol/L (ref 0–100)
pH, Arterial: 7.406 (ref 7.350–7.450)

## 2016-08-25 LAB — GLUCOSE, CAPILLARY
GLUCOSE-CAPILLARY: 136 mg/dL — AB (ref 65–99)
GLUCOSE-CAPILLARY: 182 mg/dL — AB (ref 65–99)

## 2016-08-25 LAB — POCT I-STAT 7, (LYTES, BLD GAS, ICA,H+H)
Acid-Base Excess: 4 mmol/L — ABNORMAL HIGH (ref 0.0–2.0)
Bicarbonate: 28.1 mmol/L — ABNORMAL HIGH (ref 20.0–28.0)
Bicarbonate: 26.1 mmol/L (ref 20.0–28.0)
CALCIUM ION: 1.22 mmol/L (ref 1.15–1.40)
CALCIUM ION: 1.16 mmol/L (ref 1.15–1.40)
HEMATOCRIT: 29 % — AB (ref 39.0–52.0)
HEMATOCRIT: 25 % — AB (ref 39.0–52.0)
HEMOGLOBIN: 8.5 g/dL — AB (ref 13.0–17.0)
Hemoglobin: 9.9 g/dL — ABNORMAL LOW (ref 13.0–17.0)
O2 SAT: 100 %
O2 SAT: 100 %
PCO2 ART: 45.3 mmHg (ref 32.0–48.0)
PH ART: 7.503 — AB (ref 7.350–7.450)
PO2 ART: 270 mmHg — AB (ref 83.0–108.0)
POTASSIUM: 4.1 mmol/L (ref 3.5–5.1)
Patient temperature: 35.7
Potassium: 4.4 mmol/L (ref 3.5–5.1)
SODIUM: 141 mmol/L (ref 135–145)
SODIUM: 140 mmol/L (ref 135–145)
TCO2: 29 mmol/L (ref 0–100)
TCO2: 28 mmol/L (ref 0–100)
pCO2 arterial: 35.4 mmHg (ref 32.0–48.0)
pH, Arterial: 7.367 (ref 7.350–7.450)
pO2, Arterial: 302 mmHg — ABNORMAL HIGH (ref 83.0–108.0)

## 2016-08-25 LAB — BASIC METABOLIC PANEL
Anion gap: 8 (ref 5–15)
BUN: 25 mg/dL — AB (ref 6–20)
CALCIUM: 9.3 mg/dL (ref 8.9–10.3)
CO2: 24 mmol/L (ref 22–32)
CREATININE: 1.44 mg/dL — AB (ref 0.61–1.24)
Chloride: 107 mmol/L (ref 101–111)
GFR calc Af Amer: 53 mL/min — ABNORMAL LOW (ref >60)
GFR calc non Af Amer: 46 mL/min — ABNORMAL LOW (ref >60)
GLUCOSE: 120 mg/dL — AB (ref 65–99)
Potassium: 4 mmol/L (ref 3.5–5.1)
Sodium: 139 mmol/L (ref 135–145)

## 2016-08-25 LAB — CBC
HEMATOCRIT: 24.8 % — AB (ref 39.0–52.0)
Hemoglobin: 8.3 g/dL — ABNORMAL LOW (ref 13.0–17.0)
MCH: 30.6 pg (ref 26.0–34.0)
MCHC: 33.5 g/dL (ref 30.0–36.0)
MCV: 91.5 fL (ref 78.0–100.0)
Platelets: 150 10*3/uL (ref 150–400)
RBC: 2.71 MIL/uL — ABNORMAL LOW (ref 4.22–5.81)
RDW: 14.4 % (ref 11.5–15.5)
WBC: 16.2 10*3/uL — ABNORMAL HIGH (ref 4.0–10.5)

## 2016-08-25 LAB — PROTIME-INR
INR: 1.19
INR: 1.31
Prothrombin Time: 15.2 seconds (ref 11.4–15.2)
Prothrombin Time: 16.4 seconds — ABNORMAL HIGH (ref 11.4–15.2)

## 2016-08-25 LAB — MAGNESIUM: Magnesium: 1.3 mg/dL — ABNORMAL LOW (ref 1.7–2.4)

## 2016-08-25 LAB — PREPARE RBC (CROSSMATCH)

## 2016-08-25 LAB — APTT
aPTT: 39 seconds — ABNORMAL HIGH (ref 24–36)
aPTT: 39 seconds — ABNORMAL HIGH (ref 24–36)

## 2016-08-25 SURGERY — INSERTION, ENDOVASCULAR STENT GRAFT, AORTA, THORACIC
Anesthesia: General | Site: Abdomen

## 2016-08-25 MED ORDER — PROTAMINE SULFATE 10 MG/ML IV SOLN
INTRAVENOUS | Status: DC | PRN
  Administered 2016-08-25: 40 mg via INTRAVENOUS
  Administered 2016-08-25: 10 mg via INTRAVENOUS

## 2016-08-25 MED ORDER — SODIUM CHLORIDE 0.9 % IV SOLN: INTRAVENOUS | Status: DC

## 2016-08-25 MED ORDER — NITROGLYCERIN IN D5W 200-5 MCG/ML-% IV SOLN
0.0000 ug/min | INTRAVENOUS | Status: DC
  Administered 2016-08-25: 5 ug/min via INTRAVENOUS
  Filled 2016-08-25: qty 250

## 2016-08-25 MED ORDER — PANTOPRAZOLE SODIUM 40 MG PO TBEC
40.0000 mg | DELAYED_RELEASE_TABLET | Freq: Two times a day (BID) | ORAL | Status: DC
  Administered 2016-08-25 – 2016-09-01 (×15): 40 mg via ORAL
  Filled 2016-08-25 (×15): qty 1

## 2016-08-25 MED ORDER — VITAMIN D (ERGOCALCIFEROL) 1.25 MG (50000 UNIT) PO CAPS
50000.0000 [IU] | ORAL_CAPSULE | ORAL | Status: DC
  Administered 2016-09-03: 50000 [IU] via ORAL
  Filled 2016-08-25 (×3): qty 1

## 2016-08-25 MED ORDER — LACTATED RINGERS IV SOLN
INTRAVENOUS | Status: DC | PRN
  Administered 2016-08-25: 08:00:00 via INTRAVENOUS

## 2016-08-25 MED ORDER — PHENYLEPHRINE 40 MCG/ML (10ML) SYRINGE FOR IV PUSH (FOR BLOOD PRESSURE SUPPORT)
PREFILLED_SYRINGE | INTRAVENOUS | Status: DC | PRN
  Administered 2016-08-25: 80 ug via INTRAVENOUS
  Administered 2016-08-25: 40 ug via INTRAVENOUS

## 2016-08-25 MED ORDER — CHLORHEXIDINE GLUCONATE 4 % EX LIQD: 60.0000 mL | Freq: Once | CUTANEOUS | Status: DC

## 2016-08-25 MED ORDER — ACETAMINOPHEN 325 MG PO TABS: 325.0000 mg | ORAL_TABLET | ORAL | Status: DC | PRN

## 2016-08-25 MED ORDER — ROCURONIUM BROMIDE 100 MG/10ML IV SOLN
INTRAVENOUS | Status: DC | PRN
  Administered 2016-08-25: 80 mg via INTRAVENOUS
  Administered 2016-08-25 (×2): 20 mg via INTRAVENOUS

## 2016-08-25 MED ORDER — DEXTROSE 5 % IV SOLN
1.5000 g | Freq: Two times a day (BID) | INTRAVENOUS | Status: AC
  Administered 2016-08-25 – 2016-08-26 (×2): 1.5 g via INTRAVENOUS
  Filled 2016-08-25 (×2): qty 1.5

## 2016-08-25 MED ORDER — PROPOFOL 10 MG/ML IV BOLUS
INTRAVENOUS | Status: DC | PRN
  Administered 2016-08-25: 140 mg via INTRAVENOUS

## 2016-08-25 MED ORDER — SODIUM CHLORIDE 0.9 % IV SOLN
INTRAVENOUS | Status: DC | PRN
  Administered 2016-08-25: 09:00:00

## 2016-08-25 MED ORDER — MIDAZOLAM HCL 2 MG/2ML IJ SOLN
INTRAMUSCULAR | Status: AC
  Filled 2016-08-25: qty 2

## 2016-08-25 MED ORDER — ORAL CARE MOUTH RINSE
15.0000 mL | Freq: Two times a day (BID) | OROMUCOSAL | Status: DC
  Administered 2016-08-25 – 2016-08-26 (×3): 15 mL via OROMUCOSAL

## 2016-08-25 MED ORDER — DEXTROSE 5 % IV SOLN
0.5000 ug/min | INTRAVENOUS | Status: DC
  Filled 2016-08-25: qty 4

## 2016-08-25 MED ORDER — HEMOSTATIC AGENTS (NO CHARGE) OPTIME
TOPICAL | Status: DC | PRN
  Administered 2016-08-25: 1 via TOPICAL

## 2016-08-25 MED ORDER — ASPIRIN EC 81 MG PO TBEC
81.0000 mg | DELAYED_RELEASE_TABLET | Freq: Every evening | ORAL | Status: DC
  Administered 2016-08-25 – 2016-08-31 (×7): 81 mg via ORAL
  Filled 2016-08-25 (×7): qty 1

## 2016-08-25 MED ORDER — ROCURONIUM BROMIDE 10 MG/ML (PF) SYRINGE
PREFILLED_SYRINGE | INTRAVENOUS | Status: AC
  Filled 2016-08-25: qty 10

## 2016-08-25 MED ORDER — HEPARIN SODIUM (PORCINE) 1000 UNIT/ML IJ SOLN
INTRAMUSCULAR | Status: DC | PRN
  Administered 2016-08-25: 1000 [IU] via INTRAVENOUS
  Administered 2016-08-25: 7000 [IU] via INTRAVENOUS

## 2016-08-25 MED ORDER — HYDRALAZINE HCL 20 MG/ML IJ SOLN
5.0000 mg | INTRAMUSCULAR | Status: AC | PRN
  Administered 2016-08-26 (×2): 5 mg via INTRAVENOUS
  Filled 2016-08-25: qty 1

## 2016-08-25 MED ORDER — NITROGLYCERIN 0.2 MG/ML ON CALL CATH LAB
INTRAVENOUS | Status: DC | PRN
  Administered 2016-08-25: 20 ug via INTRAVENOUS

## 2016-08-25 MED ORDER — MORPHINE SULFATE (PF) 2 MG/ML IV SOLN
2.0000 mg | INTRAVENOUS | Status: DC | PRN
  Administered 2016-08-25: 2 mg via INTRAVENOUS
  Administered 2016-08-25: 4 mg via INTRAVENOUS
  Administered 2016-08-25: 5 mg via INTRAVENOUS
  Administered 2016-08-26: 4 mg via INTRAVENOUS
  Administered 2016-08-26: 5 mg via INTRAVENOUS
  Administered 2016-08-26: 2 mg via INTRAVENOUS
  Administered 2016-08-26 – 2016-08-28 (×11): 4 mg via INTRAVENOUS
  Administered 2016-08-29 (×2): 2 mg via INTRAVENOUS
  Administered 2016-08-29: 4 mg via INTRAVENOUS
  Administered 2016-08-30 (×2): 2 mg via INTRAVENOUS
  Administered 2016-08-30: 4 mg via INTRAVENOUS
  Administered 2016-08-30 (×2): 2 mg via INTRAVENOUS
  Administered 2016-08-30 – 2016-09-01 (×4): 4 mg via INTRAVENOUS
  Administered 2016-09-02: 2 mg via INTRAVENOUS
  Filled 2016-08-25 (×6): qty 2
  Filled 2016-08-25: qty 1
  Filled 2016-08-25 (×3): qty 2
  Filled 2016-08-25 (×3): qty 1
  Filled 2016-08-25: qty 2
  Filled 2016-08-25: qty 1
  Filled 2016-08-25: qty 2
  Filled 2016-08-25 (×2): qty 1
  Filled 2016-08-25 (×2): qty 2
  Filled 2016-08-25: qty 3
  Filled 2016-08-25 (×2): qty 1
  Filled 2016-08-25 (×2): qty 2
  Filled 2016-08-25: qty 3
  Filled 2016-08-25: qty 2
  Filled 2016-08-25: qty 3
  Filled 2016-08-25 (×4): qty 2

## 2016-08-25 MED ORDER — DEXTROSE 5 % IV SOLN
INTRAVENOUS | Status: AC
  Filled 2016-08-25: qty 1.5

## 2016-08-25 MED ORDER — SODIUM CHLORIDE 0.9 % IV SOLN
INTRAVENOUS | Status: DC
  Administered 2016-08-25: 50 mL/h via INTRAVENOUS
  Administered 2016-08-28: 1 mL via INTRAVENOUS

## 2016-08-25 MED ORDER — EPHEDRINE 5 MG/ML INJ
INTRAVENOUS | Status: AC
  Filled 2016-08-25: qty 10

## 2016-08-25 MED ORDER — OXYCODONE HCL 5 MG PO TABS
20.0000 mg | ORAL_TABLET | Freq: Three times a day (TID) | ORAL | Status: DC | PRN
  Administered 2016-08-26 (×2): 20 mg via ORAL
  Filled 2016-08-25 (×4): qty 4

## 2016-08-25 MED ORDER — SUGAMMADEX SODIUM 200 MG/2ML IV SOLN
INTRAVENOUS | Status: AC
  Filled 2016-08-25: qty 2

## 2016-08-25 MED ORDER — ONDANSETRON HCL 4 MG/2ML IJ SOLN
INTRAMUSCULAR | Status: AC
  Filled 2016-08-25: qty 2

## 2016-08-25 MED ORDER — ARTIFICIAL TEARS OP OINT
TOPICAL_OINTMENT | OPHTHALMIC | Status: DC | PRN
  Administered 2016-08-25: 1 via OPHTHALMIC

## 2016-08-25 MED ORDER — OXYCODONE HCL 5 MG PO TABS: 20.0000 mg | ORAL_TABLET | Freq: Once | ORAL | Status: DC | PRN

## 2016-08-25 MED ORDER — SUCCINYLCHOLINE CHLORIDE 200 MG/10ML IV SOSY
PREFILLED_SYRINGE | INTRAVENOUS | Status: AC
  Filled 2016-08-25: qty 10

## 2016-08-25 MED ORDER — MIDAZOLAM HCL 2 MG/2ML IJ SOLN
INTRAMUSCULAR | Status: DC | PRN
  Administered 2016-08-25 (×3): 1 mg via INTRAVENOUS

## 2016-08-25 MED ORDER — LINAGLIPTIN 5 MG PO TABS
5.0000 mg | ORAL_TABLET | Freq: Every day | ORAL | Status: DC
  Administered 2016-08-26 – 2016-09-01 (×7): 5 mg via ORAL
  Filled 2016-08-25 (×7): qty 1

## 2016-08-25 MED ORDER — ESMOLOL HCL 100 MG/10ML IV SOLN
INTRAVENOUS | Status: AC
  Filled 2016-08-25: qty 20

## 2016-08-25 MED ORDER — PHENYLEPHRINE 40 MCG/ML (10ML) SYRINGE FOR IV PUSH (FOR BLOOD PRESSURE SUPPORT)
PREFILLED_SYRINGE | INTRAVENOUS | Status: AC
  Filled 2016-08-25: qty 10

## 2016-08-25 MED ORDER — DEXMEDETOMIDINE HCL IN NACL 200 MCG/50ML IV SOLN
INTRAVENOUS | Status: DC | PRN
  Administered 2016-08-25: .5 ug/kg/h via INTRAVENOUS

## 2016-08-25 MED ORDER — SUFENTANIL CITRATE 50 MCG/ML IV SOLN
INTRAVENOUS | Status: AC
  Filled 2016-08-25: qty 1

## 2016-08-25 MED ORDER — PROTAMINE SULFATE 10 MG/ML IV SOLN
INTRAVENOUS | Status: AC
  Filled 2016-08-25: qty 5

## 2016-08-25 MED ORDER — ACETAMINOPHEN 325 MG RE SUPP
325.0000 mg | RECTAL | Status: DC | PRN
  Administered 2016-09-02: 650 mg via RECTAL
  Filled 2016-08-25: qty 2

## 2016-08-25 MED ORDER — ONDANSETRON HCL 4 MG/2ML IJ SOLN: 4.0000 mg | Freq: Once | INTRAMUSCULAR | Status: DC | PRN

## 2016-08-25 MED ORDER — CLONIDINE HCL 0.1 MG PO TABS
0.1000 mg | ORAL_TABLET | Freq: Three times a day (TID) | ORAL | Status: DC
  Administered 2016-08-25 – 2016-08-26 (×5): 0.1 mg via ORAL
  Filled 2016-08-25 (×5): qty 1

## 2016-08-25 MED ORDER — SODIUM CHLORIDE 0.9 % IJ SOLN
INTRAMUSCULAR | Status: AC
  Filled 2016-08-25: qty 10

## 2016-08-25 MED ORDER — PHENOL 1.4 % MT LIQD
1.0000 | OROMUCOSAL | Status: DC | PRN
Start: 2016-08-25 — End: 2016-09-10
  Administered 2016-08-29 – 2016-09-05 (×2): 1 via OROMUCOSAL
  Filled 2016-08-25 (×2): qty 177

## 2016-08-25 MED ORDER — LOSARTAN POTASSIUM 50 MG PO TABS
100.0000 mg | ORAL_TABLET | Freq: Every day | ORAL | Status: DC
  Administered 2016-08-26 – 2016-09-01 (×7): 100 mg via ORAL
  Filled 2016-08-25 (×7): qty 2

## 2016-08-25 MED ORDER — OMEGA-3-ACID ETHYL ESTERS 1 G PO CAPS
2.0000 g | ORAL_CAPSULE | Freq: Two times a day (BID) | ORAL | Status: DC
  Administered 2016-08-25 – 2016-08-31 (×9): 2 g via ORAL
  Administered 2016-08-31: 1 g via ORAL
  Administered 2016-09-01: 2 g via ORAL
  Filled 2016-08-25 (×15): qty 2

## 2016-08-25 MED ORDER — POTASSIUM CHLORIDE CRYS ER 20 MEQ PO TBCR: 20.0000 meq | EXTENDED_RELEASE_TABLET | Freq: Every day | ORAL | Status: DC | PRN

## 2016-08-25 MED ORDER — NOREPINEPHRINE BITARTRATE 1 MG/ML IV SOLN
0.0000 ug/min | INTRAVENOUS | Status: DC
  Filled 2016-08-25: qty 4

## 2016-08-25 MED ORDER — MOMETASONE FURO-FORMOTEROL FUM 200-5 MCG/ACT IN AERO
2.0000 | INHALATION_SPRAY | Freq: Two times a day (BID) | RESPIRATORY_TRACT | Status: DC
  Administered 2016-08-25 – 2016-09-03 (×17): 2 via RESPIRATORY_TRACT
  Filled 2016-08-25 (×2): qty 8.8

## 2016-08-25 MED ORDER — EPHEDRINE SULFATE-NACL 50-0.9 MG/10ML-% IV SOSY
PREFILLED_SYRINGE | INTRAVENOUS | Status: DC | PRN
  Administered 2016-08-25: 5 mg via INTRAVENOUS
  Administered 2016-08-25: 10 mg via INTRAVENOUS

## 2016-08-25 MED ORDER — LEVOFLOXACIN 750 MG PO TABS
750.0000 mg | ORAL_TABLET | Freq: Every day | ORAL | Status: DC
  Filled 2016-08-25: qty 1

## 2016-08-25 MED ORDER — PHENYLEPHRINE HCL 10 MG/ML IJ SOLN
INTRAMUSCULAR | Status: DC | PRN
  Administered 2016-08-25: 25 ug/min via INTRAVENOUS

## 2016-08-25 MED ORDER — ATORVASTATIN CALCIUM 40 MG PO TABS
40.0000 mg | ORAL_TABLET | Freq: Every evening | ORAL | Status: DC
  Administered 2016-08-25 – 2016-09-01 (×8): 40 mg via ORAL
  Filled 2016-08-25 (×8): qty 1

## 2016-08-25 MED ORDER — DEXTROSE 5 % IV SOLN
1.5000 g | INTRAVENOUS | Status: AC
  Administered 2016-08-25: 1.5 g via INTRAVENOUS

## 2016-08-25 MED ORDER — LIDOCAINE 2% (20 MG/ML) 5 ML SYRINGE
INTRAMUSCULAR | Status: AC
  Filled 2016-08-25: qty 5

## 2016-08-25 MED ORDER — CALCIUM CHLORIDE 10 % IV SOLN
INTRAVENOUS | Status: DC | PRN
  Administered 2016-08-25: 500 mg via INTRAVENOUS

## 2016-08-25 MED ORDER — OXYCODONE HCL 5 MG/5ML PO SOLN: 20.0000 mg | Freq: Once | ORAL | Status: DC | PRN

## 2016-08-25 MED ORDER — MAGNESIUM SULFATE 2 GM/50ML IV SOLN
2.0000 g | Freq: Every day | INTRAVENOUS | Status: AC | PRN
  Administered 2016-08-25: 2 g via INTRAVENOUS
  Filled 2016-08-25: qty 50

## 2016-08-25 MED ORDER — AMLODIPINE BESYLATE 5 MG PO TABS
5.0000 mg | ORAL_TABLET | Freq: Every day | ORAL | Status: DC
  Administered 2016-08-25 – 2016-08-27 (×3): 5 mg via ORAL
  Filled 2016-08-25 (×3): qty 1

## 2016-08-25 MED ORDER — METOPROLOL TARTRATE 25 MG PO TABS
25.0000 mg | ORAL_TABLET | Freq: Two times a day (BID) | ORAL | Status: DC
  Administered 2016-08-26 – 2016-09-01 (×13): 25 mg via ORAL
  Filled 2016-08-25 (×13): qty 1

## 2016-08-25 MED ORDER — PROPOFOL 10 MG/ML IV BOLUS
INTRAVENOUS | Status: AC
  Filled 2016-08-25: qty 20

## 2016-08-25 MED ORDER — LIDOCAINE 2% (20 MG/ML) 5 ML SYRINGE
INTRAMUSCULAR | Status: DC | PRN
  Administered 2016-08-25: 100 mg via INTRAVENOUS

## 2016-08-25 MED ORDER — TAMSULOSIN HCL 0.4 MG PO CAPS: 0.4000 mg | ORAL_CAPSULE | Freq: Every day | ORAL | Status: DC | PRN

## 2016-08-25 MED ORDER — DEXMEDETOMIDINE HCL IN NACL 200 MCG/50ML IV SOLN
INTRAVENOUS | Status: AC
  Filled 2016-08-25: qty 50

## 2016-08-25 MED ORDER — HYDROMORPHONE HCL 1 MG/ML IJ SOLN: 0.2500 mg | INTRAMUSCULAR | Status: DC | PRN

## 2016-08-25 MED ORDER — IODIXANOL 320 MG/ML IV SOLN
INTRAVENOUS | Status: DC | PRN
  Administered 2016-08-25: 30 mL via INTRAVENOUS

## 2016-08-25 MED ORDER — ALBUMIN HUMAN 5 % IV SOLN
INTRAVENOUS | Status: DC | PRN
  Administered 2016-08-25: 12:00:00 via INTRAVENOUS

## 2016-08-25 MED ORDER — SUFENTANIL CITRATE 50 MCG/ML IV SOLN
INTRAVENOUS | Status: DC | PRN
  Administered 2016-08-25 (×3): 5 ug via INTRAVENOUS
  Administered 2016-08-25: 20 ug via INTRAVENOUS

## 2016-08-25 MED ORDER — HEPARIN SODIUM (PORCINE) 1000 UNIT/ML IJ SOLN
INTRAMUSCULAR | Status: AC
  Filled 2016-08-25: qty 1

## 2016-08-25 MED ORDER — SODIUM CHLORIDE 0.9 % IV SOLN: 500.0000 mL | Freq: Once | INTRAVENOUS | Status: DC | PRN

## 2016-08-25 MED ORDER — GUAIFENESIN-DM 100-10 MG/5ML PO SYRP: 15.0000 mL | ORAL_SOLUTION | ORAL | Status: DC | PRN

## 2016-08-25 MED ORDER — ARTIFICIAL TEARS OP OINT
TOPICAL_OINTMENT | OPHTHALMIC | Status: AC
  Filled 2016-08-25: qty 3.5

## 2016-08-25 MED ORDER — 0.9 % SODIUM CHLORIDE (POUR BTL) OPTIME
TOPICAL | Status: DC | PRN
  Administered 2016-08-25: 2000 mL

## 2016-08-25 MED ORDER — DOCUSATE SODIUM 100 MG PO CAPS
100.0000 mg | ORAL_CAPSULE | Freq: Every day | ORAL | Status: DC
  Administered 2016-08-26 – 2016-09-01 (×7): 100 mg via ORAL
  Filled 2016-08-25 (×7): qty 1

## 2016-08-25 MED ORDER — PROPAFENONE HCL 225 MG PO TABS
225.0000 mg | ORAL_TABLET | Freq: Two times a day (BID) | ORAL | Status: DC
  Administered 2016-08-25 – 2016-09-09 (×22): 225 mg via ORAL
  Filled 2016-08-25 (×31): qty 1

## 2016-08-25 MED ORDER — ALUM & MAG HYDROXIDE-SIMETH 200-200-20 MG/5ML PO SUSP
15.0000 mL | ORAL | Status: DC | PRN
Start: 2016-08-25 — End: 2016-09-10

## 2016-08-25 MED ORDER — PANTOPRAZOLE SODIUM 40 MG PO TBEC: 40.0000 mg | DELAYED_RELEASE_TABLET | Freq: Every day | ORAL | Status: DC

## 2016-08-25 MED ORDER — ONDANSETRON HCL 4 MG/2ML IJ SOLN
4.0000 mg | Freq: Four times a day (QID) | INTRAMUSCULAR | Status: DC | PRN
  Administered 2016-08-26 – 2016-09-05 (×7): 4 mg via INTRAVENOUS
  Filled 2016-08-25 (×7): qty 2

## 2016-08-25 MED ORDER — DOCUSATE SODIUM 100 MG PO CAPS
200.0000 mg | ORAL_CAPSULE | Freq: Two times a day (BID) | ORAL | Status: DC | PRN
  Administered 2016-08-30: 200 mg via ORAL
  Filled 2016-08-25: qty 2

## 2016-08-25 MED ORDER — ONDANSETRON HCL 4 MG/2ML IJ SOLN
INTRAMUSCULAR | Status: DC | PRN
  Administered 2016-08-25: 4 mg via INTRAVENOUS

## 2016-08-25 MED ORDER — SUGAMMADEX SODIUM 200 MG/2ML IV SOLN
INTRAVENOUS | Status: DC | PRN
  Administered 2016-08-25: 120 mg via INTRAVENOUS

## 2016-08-25 SURGICAL SUPPLY — 81 items
CANISTER SUCTION 2500CC (MISCELLANEOUS) ×3 IMPLANT
CANNULA DLP CORONARY OST 12FR (MISCELLANEOUS) ×3 IMPLANT
CATH ACCU-VU SIZ PIG 5F 100CM (CATHETERS) ×3 IMPLANT
CATH ANGIO 5F BER2 65CM (CATHETERS) ×3 IMPLANT
CLIP TI MEDIUM 24 (CLIP) ×3 IMPLANT
CLIP TI WIDE RED SMALL 24 (CLIP) ×3 IMPLANT
COVER PROBE W GEL 5X96 (DRAPES) ×3 IMPLANT
DERMABOND ADVANCED (GAUZE/BANDAGES/DRESSINGS) ×2
DERMABOND ADVANCED .7 DNX12 (GAUZE/BANDAGES/DRESSINGS) ×1 IMPLANT
DEVICE CLOSURE PERCLS PRGLD 6F (VASCULAR PRODUCTS) IMPLANT
DRAPE ZERO GRAVITY STERILE (DRAPES) ×6 IMPLANT
DRSG TEGADERM 2-3/8X2-3/4 SM (GAUZE/BANDAGES/DRESSINGS) ×3 IMPLANT
DRYSEAL FLEXSHEATH 22FR 33CM (SHEATH) ×2
ELECT REM PT RETURN 9FT ADLT (ELECTROSURGICAL) ×6
ELECTRODE REM PT RTRN 9FT ADLT (ELECTROSURGICAL) ×2 IMPLANT
ENDOPROSTHESIS THORAC 34X34X20 (Endovascular Graft) ×1 IMPLANT
ENDOPROTH THORACIC 34X34X20 (Endovascular Graft) ×3 IMPLANT
FELT TEFLON 1X6 (MISCELLANEOUS) ×3 IMPLANT
GLOVE BIOGEL PI IND STRL 6.5 (GLOVE) ×1 IMPLANT
GLOVE BIOGEL PI IND STRL 7.5 (GLOVE) ×1 IMPLANT
GLOVE BIOGEL PI INDICATOR 6.5 (GLOVE) ×2
GLOVE BIOGEL PI INDICATOR 7.5 (GLOVE) ×2
GLOVE ECLIPSE 6.5 STRL STRAW (GLOVE) ×3 IMPLANT
GLOVE SURG SS PI 7.5 STRL IVOR (GLOVE) ×3 IMPLANT
GOWN STRL REUS W/ TWL LRG LVL3 (GOWN DISPOSABLE) ×2 IMPLANT
GOWN STRL REUS W/ TWL XL LVL3 (GOWN DISPOSABLE) ×1 IMPLANT
GOWN STRL REUS W/TWL LRG LVL3 (GOWN DISPOSABLE) ×4
GOWN STRL REUS W/TWL XL LVL3 (GOWN DISPOSABLE) ×2
GRAFT BALLN CATH 65CM (STENTS) ×2 IMPLANT
HEMOSTAT SNOW SURGICEL 2X4 (HEMOSTASIS) IMPLANT
HEMOSTAT SURGICEL 2X14 (HEMOSTASIS) ×3 IMPLANT
KIT BASIN OR (CUSTOM PROCEDURE TRAY) ×3 IMPLANT
KIT ROOM TURNOVER OR (KITS) ×3 IMPLANT
LIQUID BAND (GAUZE/BANDAGES/DRESSINGS) ×3 IMPLANT
LOOP VESSEL MAXI BLUE (MISCELLANEOUS) ×6 IMPLANT
LOOP VESSEL MINI RED (MISCELLANEOUS) ×6 IMPLANT
NEEDLE PERC 18GX7CM (NEEDLE) ×3 IMPLANT
NS IRRIG 1000ML POUR BTL (IV SOLUTION) ×3 IMPLANT
PACK ENDOVASCULAR (PACKS) ×3 IMPLANT
PAD ARMBOARD 7.5X6 YLW CONV (MISCELLANEOUS) ×6 IMPLANT
PERCLOSE PROGLIDE 6F (VASCULAR PRODUCTS)
SHEATH AVANTI 11CM 8FR (MISCELLANEOUS) IMPLANT
SHEATH DRYSEAL FLEX 22FR 33CM (SHEATH) ×1 IMPLANT
SHEATH INTROD 8 FR 25CM (MISCELLANEOUS) ×3 IMPLANT
SHIELD RADPAD SCOOP 12X17 (MISCELLANEOUS) ×6 IMPLANT
SPOGE SURGIFLO 8M (HEMOSTASIS) ×2
SPONGE INTESTINAL PEANUT (DISPOSABLE) ×9 IMPLANT
SPONGE SURGIFLO 8M (HEMOSTASIS) ×1 IMPLANT
STENT GRAFT BALLN CATH 65CM (STENTS) ×4
STOPCOCK MORSE 400PSI 3WAY (MISCELLANEOUS) ×6 IMPLANT
SUT ETHILON 3 0 PS 1 (SUTURE) IMPLANT
SUT PDS AB 1 CTX 36 (SUTURE) ×6 IMPLANT
SUT PROLENE 5 0 C 1 24 (SUTURE) IMPLANT
SUT PROLENE 5 0 C 1 36 (SUTURE) ×21 IMPLANT
SUT PROLENE 6 0 C 1 30 (SUTURE) ×3 IMPLANT
SUT PROLENE 6 0 CC (SUTURE) ×3 IMPLANT
SUT SILK 0 TIES 10X30 (SUTURE) ×3 IMPLANT
SUT SILK 2 0 (SUTURE) ×2
SUT SILK 2-0 18XBRD TIE 12 (SUTURE) ×1 IMPLANT
SUT SILK 3 0 (SUTURE) ×2
SUT SILK 3 0 TIES 10X30 (SUTURE) ×6 IMPLANT
SUT SILK 3-0 18XBRD TIE 12 (SUTURE) ×1 IMPLANT
SUT SILK 4 0 (SUTURE) ×2
SUT SILK 4-0 18XBRD TIE 12 (SUTURE) ×1 IMPLANT
SUT VIC AB 2-0 CT1 27 (SUTURE) ×2
SUT VIC AB 2-0 CT1 TAPERPNT 27 (SUTURE) ×1 IMPLANT
SUT VIC AB 3-0 SH 27 (SUTURE)
SUT VIC AB 3-0 SH 27X BRD (SUTURE) IMPLANT
SUT VIC AB 3-0 SH 8-18 (SUTURE) ×3 IMPLANT
SUT VICRYL 4-0 PS2 18IN ABS (SUTURE) ×6 IMPLANT
SYR 30ML LL (SYRINGE) IMPLANT
SYR 50ML LL SCALE MARK (SYRINGE) ×3 IMPLANT
SYR BULB IRRIGATION 50ML (SYRINGE) ×3 IMPLANT
TOWEL BLUE STERILE X RAY DET (MISCELLANEOUS) ×6 IMPLANT
TRAY FOLEY W/METER SILVER 16FR (SET/KITS/TRAYS/PACK) ×3 IMPLANT
TUBE CONNECTING 20'X1/4 (TUBING) ×1
TUBE CONNECTING 20X1/4 (TUBING) ×2 IMPLANT
TUBING HIGH PRESSURE 120CM (CONNECTOR) ×6 IMPLANT
WIRE BENTSON .035X145CM (WIRE) ×3 IMPLANT
WIRE STIFF LUNDERQUIST 260CM (WIRE) ×3 IMPLANT
YANKAUER SUCT BULB TIP NO VENT (SUCTIONS) ×3 IMPLANT

## 2016-08-25 NOTE — H&P (Signed)
[address]       [place] [address]             [phone]                    [name] [facility] Medical Record #[id number] Date of Birth: 04-17-1941  Referring: Ethel Rana Primary Care: Lovenia Kim, PA-C  Chief Complaint:    Thoracic Aortic Aneurysm     6 week f/u Chest CT 07/05/2016      History of Present Illness:    [name] 75 y.o. male is seen in the office for finding on chest CT of descending thoracic aneurysm . Patient has known severe peripheral vascular disease followed by  Dr. Hart Rochester for  his left lower extremity femoral to posterior tibial bypass which was done using composite bilateral cephalic vein grafts in 2007. He has had previous left carotid endarterectomy and redo left carotid endarterectomy most recent of which was 2007.  He denies any specific claudication symptoms but is able to walk one block and then stops because of fatigue. He has  is severe back discomfort which is chronic in nature.   He had a mild stroke in March 2016 while on coumadin as manifested by weakness in his left hand and foot, and drawing of the left corner of his mouth; these symptoms lasted about 2 weeks. He was evaluated by MRI of his head as requested by his PCP, per pt. He denies any subsequent strokes or TIA's.    He complained of cough and blood tinged sputum, also 50 lb wt loss over the past two years . CT of abdomen then ct of chest was done. 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 Patient referred to pulmonary to work up hemoptysis and  Bilateral lower lobe peribronchovascular reticular and airspace opacities with several airspace opacities appearing masslike.Since last seen the patient presented with acute thrombosis of his left femoral-tibial bypass requiring thrombolytic therapy to open.     Current Activity/ Functional Status:  Patient is not independent with  mobility/ambulation, transfers, ADL's, IADL's.   Zubrod Score: At the time of surgery this patient's most appropriate activity status/level should be described as: []     0    Normal activity, no symptoms []     1    Restricted in physical strenuous activity but ambulatory, able to do out light work [x]     2    Ambulatory and capable of self care, unable to do work activities, up and about               >50 % of waking hours                              []     3    Only limited self care, in bed greater than 50% of waking hours []     4    Completely disabled, no self care, confined to bed or chair []     5    Moribund   Past Medical History:  Diagnosis Date  . AAA (abdominal aortic aneurysm) (HCC)   . Anemia   . Anxiety   . Arthritis    Gout  . Atrial fibrillation (HCC)   . Barrett esophagus   . Carotid artery occlusion    Carotid endarterectomy 2002 and 2004  . COPD (chronic  obstructive pulmonary disease) (HCC)   . Diabetes mellitus   . Diverticulosis   . DVT (deep venous thrombosis) (HCC)   . Erectile dysfunction   . GERD (gastroesophageal reflux disease)   . Gout   . Hiatal hernia   . History of stress test    March 2012 post stress ejection fraction 56%, negative for ischemia.  Last 2-D echocardiogram March 2012 younger than 55% mild concentric left ventricular hypertrophy grade 1 diastolic dysfunction. Mildly dilated left atrium. 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  . Hyperlipidemia   . Hypertension   . Kidney stone   . Lumbar stenosis   . PAD (peripheral artery disease) (HCC)    Fem-Fem by-pass graft 1991. redo patch angioplasty in 2000 and 2004.  LEA dopplers: 04/2012- R-ABI 1.34, L-ABI 0.97, patent Left lower extremity graft.  . Pancreatitis   . Stroke [facility]) March 2016   Lihgt Stroke    Past Surgical History:  Procedure Laterality Date  . CAROTID ENDARTERECTOMY  2002   Left CEA  . CAROTID ENDARTERECTOMY  2007   Left CEA redo  . CATARACT EXTRACTION Left 02-02-95  .  CATARACT EXTRACTION Right 04-20-95  . colonoscopy    . IR GENERIC HISTORICAL  06/13/2016   IR INFUSION THROMBOL ARTERIAL INITIAL (MS) 06/13/2016 Richarda Overlie, MD [facility]  . IR GENERIC HISTORICAL  06/13/2016   IR ANGIOGRAM EXTREMITY LEFT 06/13/2016 Richarda Overlie, MD [facility]  . IR GENERIC HISTORICAL  06/13/2016   IR US GUIDE VASC ACCESS LEFT 06/13/2016 Richarda Overlie, MD [facility]  . IR GENERIC HISTORICAL  06/14/2016   IR THROMB F/U EVAL ART/VEN FINAL DAY (MS) 06/14/2016 Oley Balm, MD [facility]  . LUMBAR DISC SURGERY  1980  . PR VEIN BYPASS GRAFT,AORTO-FEM-POP  2007   Left Fem-post. tibial  . PR VEIN BYPASS GRAFT,AORTO-FEM-POP  1999-2004   numerous revision Left Fem-pop    Family History  Problem Relation Age of Onset  . Heart disease Father   . Heart attack Father     X's 3  . Hyperlipidemia Father   . Hypertension Father   . Diabetes Brother   . Hypertension Brother   . Cancer Sister     type unknown    Social History   Social History  . Marital status: Married    Spouse name: N/A  . Number of children: 3  . Years of education: N/A   Occupational History  . disabled    Social History Main Topics  . Smoking status: Heavy Tobacco Smoker    Packs/day: 1.00    Years: 60.00    Types: Cigarettes, E-cigarettes  . Smokeless tobacco: Former Neurosurgeon    Types: Chew    Quit date: 05/21/1968  . Alcohol use No  . Drug use: No  . Sexual activity: Not on file   Other Topics Concern  . Not on file   Social History Narrative   Married, lives with spouse   3 daughters   Education: 10th grade   Occupation: disabled from back pain.  Metal work/fabrication x107yrs   Religion: [employer]   Caffeine: rare       History  Smoking Status  . Heavy Tobacco Smoker  . Packs/day: 1.00  . Years: 60.00  . Types: Cigarettes, E-cigarettes  Smokeless Tobacco  . Former Neurosurgeon  . Types: Chew  . Quit date: 05/21/1968    History  Alcohol Use No     Allergies  Allergen Reactions  .  Lopressor [Metoprolol Tartrate] Palpitations  LOPRESSOR bothers the patient.  . Niacin Swelling    FACIAL EDEMA    Current Facility-Administered Medications  Medication Dose Route Frequency Provider Last Rate Last Dose  . 0.9 %  sodium chloride infusion   Intravenous Continuous Nada Libman, MD      . cefUROXime (ZINACEF) 1.5 g in dextrose 5 % 50 mL IVPB  1.5 g Intravenous 30 min Pre-Op Nada Libman, MD      . chlorhexidine (HIBICLENS) 4 % liquid 4 application  60 mL Topical Once Nada Libman, MD       And  . Melene Muller ON 08/26/2016] chlorhexidine (HIBICLENS) 4 % liquid 4 application  60 mL Topical Once Nada Libman, MD          Review of Systems:     Cardiac Review of Systems: Y or N  Chest Pain [ n   ]  Resting SOB [ n  ] Exertional SOB  Cove.Etienne  ]  Orthopnea [ n ]   Pedal Edema [   ]    Palpitations Cove.Etienne  ] Syncope  [ n ]   Presyncope [  n ]  General Review of Systems: [Y] = yes [  ]=no Constitional: recent weight change Mahler.Beck  ];  Wt loss over the last 3 months [   ] anorexia Cove.Etienne  ]; fatigue [  y]; nausea [  ]; night sweats [  ]; fever [  ]; or chills [  ];          Dental: poor dentition[  ]; Last Dentist visit:   Eye : blurred vision [  ]; diplopia [   ]; vision changes [  ];  Amaurosis fugax[  ]; Resp: cough Cove.Etienne  ];  wheezing[y  ];  hemoptysis[y  ]; shortness of breath[ y ]; paroxysmal nocturnal dyspnea[ y ]; dyspnea on exertion[ y ]; or orthopnea[  ];  GI:  gallstones[  ], vomiting[  ];  dysphagia[  ]; melena[  ];  hematochezia [  ]; heartburn[  ];   Hx of  Colonoscopy[  ]; GU: kidney stones [ y ]; hematuria[  ];   dysuria [  ];  nocturia[  ];  history of     obstruction [  ]; urinary frequency [  ]             Skin: rash, swelling[  ];, hair loss[  ];  peripheral edema[  ];  or itching[  ]; Musculosketetal: myalgias[  ];  joint swelling[  ];  joint erythema[  ];  joint pain[  ];  back pain[  ];  Heme/Lymph: bruising[  ];  bleeding[  ];  anemia[  ];  Neuro: TIA[  ];   headaches[  ];  stroke[y  ];  vertigo[  ];  seizures[  ];   paresthesias[  ];  difficulty walking[ y ];  Psych:depression[  ]; anxiety[  ];  Endocrine: diabetes[ y ];  thyroid dysfunction[  ];  Immunizations: Flu up to date [ y ]; Pneumococcal up to date Cove.Etienne  ];  Other:  Physical Exam: There were no vitals taken for this visit.  PHYSICAL EXAMINATION: General appearance: alert, cooperative, appears older than stated age and cachectic Head: Normocephalic, without obvious abnormality, atraumatic Neck: no adenopathy, bilaterial carotid bruits carotid bruit, no JVD, supple, symmetrical, trachea midline and thyroid not enlarged, symmetric, no tenderness/mass/nodules Lymph nodes: Cervical, supraclavicular, and axillary nodes normal. Resp: diminished breath sounds bibasilar Back: symmetric, no curvature. ROM  normal. No CVA tenderness. Cardio: irregularly irregular rhythm GI: soft, non-tender; bowel sounds normal; no masses,  no organomegaly Extremities: extremities normal, atraumatic, no cyanosis or edema Neurologic: Grossly normal  Left pt pulse palpable , as noted aneurysm dilation of previous composite vein graft left leg, left dp and right pt/dp not palpable   Patient does have some bruising around the left femorotibial bypass related to his acute thrombosis of the graft and subsequent lysis done last month. Previous  dilation of left fem tib present and slightly larger    Diagnostic Studies & Laboratory data:     Recent Radiology Findings:  Dg Chest 2 View  Result Date: 08/23/2016 CLINICAL DATA:  Preoperative evaluation for upcoming heart surgery EXAM: CHEST  2 VIEW COMPARISON:  08/17/2016 FINDINGS: Cardiac shadow is stable. Aortic calcifications are again seen. Focal aneurysmal dilatation in the descending thoracic aorta is again noted and stable. The lungs are well aerated bilaterally. Patchy density seen in the in the left lung base previously continues to improve. No new focal  infiltrate is seen. No acute bony abnormality is noted. Degenerative changes of the thoracic spine are seen. IMPRESSION: Continued improvement in left lower lobe a densities. No new focal abnormality is seen. Electronically Signed   By: Alcide Clever M.D.   On: 08/23/2016 12:18   Dg Chest 2 View  Result Date: 08/17/2016 CLINICAL DATA:  Preoperative examination. Patient reports productive cough upon arising in the mornings. No fever. History of hypertension and diabetes and peripheral vascular disease. EXAM: CHEST  2 VIEW COMPARISON:  CT scan of the chest of July 05, 2016 and chest x-ray of April 15, 2016 FINDINGS: The lungs are mildly hyperinflated. The interstitial markings are coarse bilaterally and are more conspicuous in the left lower lobe today. The heart is normal in size. The pulmonary vascularity is not engorged. There is calcification in the wall of the aortic arch. There is no pleural effusion. There is mild multilevel degenerative disc disease of the thoracic spine. IMPRESSION: COPD and pulmonary fibrotic change. Mild interval increase in lower lobe interstitial markings on the left may reflect atelectasis, early pneumonia, or bronchiectatic changes. Followup PA and lateral chest X-ray is recommended in 3-4 weeks following trial of antibiotic therapy to ensure resolution and exclude underlying malignancy. Aortic atherosclerosis. Electronically Signed   By: David  [place] M.D.   On: 08/17/2016 14:29  CLINICAL DATA:  Follow-up CT abnormality from 04/26/2016.  EXAM: CT CHEST WITHOUT CONTRAST  TECHNIQUE: Multidetector CT imaging of the chest was performed following the standard protocol without IV contrast.  COMPARISON:  04/26/2016  FINDINGS: Cardiovascular: The heart is normal in size. The advanced calcific atherosclerotic disease of the coronary arteries and mitral valve annulus are noted. Similarly, there is advanced calcific atherosclerotic disease of the thoracic aorta. There is a  tiny saccular aneurysm of the left margin of the aortic arch and a large saccular aneurysm of the distal thoracic aorta are measuring 5.7 cm in transverse diameter, stable to slightly decreased from the prior CT.  Mediastinum/Nodes: Shotty mediastinal lymph nodes are seen, nonspecific by CT criteria.  Lungs/Pleura: Advanced upper lobe predominant emphysematous changes and traction bronchiectasis. The previously seen patchy peribronchovascular opacities in bilateral lower lobes have decreased in prominence. In particular due to masslike opacities in bilateral lower lobes are smaller in size, which support infectious or inflammatory etiology. Nodular airspace consolidation in the medial portion of the left lung base open (image 104/153, sequence 3) is new from the prior exam and therefore  likely also represents infectious or inflammatory changes. There is a persistent frothy material filling the left lower lobe segmental bronchus.  Upper Abdomen: No acute findings.  Musculoskeletal: Diffuse osteopenia. Osteoarthritic changes throughout the thoracic spine and remote right-sided rib fractures are again seen.  IMPRESSION: Tortuosity and advanced calcific atherosclerotic disease of the thoracic aorta with slightly enlarged saccular aneurysm of the distal thoracic aorta measuring 5.7 cm in transverse diameter. Cardiothoracic surgical follow-up is recommended due to the risk of rupture. Sub cm saccular aneurysm of the aortic arch is stable.  Advanced calcific atherosclerotic disease of the coronary arteries and dystrophic calcifications of the mitral valve.  Upper lobe predominant advanced emphysematous changes with apical pleural parenchymal scarring and bronchiectasis.  Interval decrease in the prominence of bilateral lower lobes patchy subpleural airspace consolidation, suggestive of infectious/ inflammatory changes or atelectasis.  Persistent occlusion of the posterior  basilar segmental bronchus in the left lower lobe by a low-density material, favor inspissated secretions.  These results will be called to the ordering clinician or representative by the Radiologist Assistant, and communication documented in the PACS or zVision Dashboard.   Electronically Signed   By: Ted Mcalpine M.D.   On: 07/05/2016 10:38    Ct Chest Wo Contrast  04/26/2016  CLINICAL DATA:  Pt states current smoker x 60 years x 1ppd, pt denies cough/sob, wt loss close to 100lbs in a couple years EXAM: CT CHEST WITHOUT CONTRAST TECHNIQUE: Multidetector CT imaging of the chest was performed following the standard protocol without IV contrast. COMPARISON:  Chest radiograph, 04/15/2016. FINDINGS: Neck base and axilla: No mass or adenopathy. Dense atherosclerotic vascular calcifications. Mediastinum and hila: Heart is normal in size and configuration. There are dense coronary artery calcifications. There is small focal bulge along the left margin of the aortic arch just distal to the left subclavian artery takeoff. There is a more significant focal bulge of the descending thoracic aorta having a maximum diameter 5.5 cm, measuring 4 cm in length. Dense atherosclerotic calcifications are noted throughout the thoracic aorta and along the aortic arch branch vessels extending into the upper abdominal arteries. There is an enlarged subcarinal lymph node measuring 14 mm in short axis. No mediastinal or hilar masses or other enlarged lymph nodes. Lungs and pleura: There is reticular and patchy airspace peribronchovascular opacities in both lower lobes, left greater than right. Low attenuation material occludes the posterior, basal segment of the left lower lobe consistent with mucous/secretions. There are masslike areas of consolidation in the lower lobes, largest on the left measuring 3.1 x 2.1 x 2.3 cm. Largest on the right measures 2.1 x 1.1 x 2.0 cm. Lungs show additional areas of subtle  ground-glass opacity and peripheral reticular opacities, the latter likely scarring. In the apices there is pleural parenchymal scarring. Mild to moderate centrilobular emphysema is noted most evident near the lung apices. There is no evidence of pulmonary edema. No pleural effusion or pneumothorax. Limited upper abdomen: Left renal atrophy. No acute findings. No convincing liver or adrenal mass on the included field of view. Musculoskeletal: Bones are demineralized. There are degenerative changes throughout the visualized spine old right rib fractures noted. No osteoblastic or osteolytic lesions. IMPRESSION: 1. Bilateral lower lobe peribronchovascular reticular and airspace opacities with several airspace opacities appearing masslike. This is most likely infectious/ inflammatory in origin. Neoplastic disease is not excluded. Followup options include tissue sampling of the larger left lower lobe mass like consolidation, short-term CT follow-up following therapy if there are symptoms suggestive of infection,  or follow-up PET-CT. 2. Focal descending thoracic aortic aneurysm measuring 5.5 cm. Cardiothoracic surgical follow-up is recommended. 3. There are chronic appearing changes of apical pleural parenchymal scarring and emphysema as well as extensive aortic and branch vessel atherosclerotic disease and coronary artery calcifications. Electronically Signed   By: Amie Portlandavid  Ormond M.D.   On: 04/26/2016 15:25     I have independently reviewed the above radiologic studies.  Recent Lab Findings: Lab Results  Component Value Date   WBC 10.7 (H) 08/17/2016   HGB 10.3 (L) 08/17/2016   HCT 30.8 (L) 08/17/2016   PLT 201 08/17/2016   GLUCOSE 191 (H) 08/17/2016   ALT 18 08/17/2016   AST 28 08/17/2016   NA 141 08/17/2016   K 4.1 08/17/2016   CL 113 (H) 08/17/2016   CREATININE 1.22 08/17/2016   BUN 23 (H) 08/17/2016   CO2 21 (L) 08/17/2016   INR 2.07 06/19/2016   HGBA1C 6.5 (H) 08/17/2016      Assessment /  Plan:   1/ Focal descending thoracic aortic aneurysm measuring 5.7cm 2/ Bilateral lower lobe peribronchovascular reticular and airspace opacities with several airspace opacities appearing masslike. Could be  infectious/ inflammatory in origin, on serial CT this area has decreased in size so is much more likely to be inflammatory 3/ Severe COPD- long term and current tobacco use 4/ Protein malnutrition 5/extensive aortic and branch vessel atherosclerotic disease and coronary artery calcifications 6/ long term anticoagulation on coumadin, history of AF and stroke 7/ Diabetic with peripheral vascular, cerebral vascular complications and neuropathy complications   Discussed with patient and family the dx of 5.7cm saccular descending thoracic aneurysm, risk of rupture and the possibility of aortic stenting for treatment of thoracic aneurysm, however with degree of vascular calcification  Will complicate access and my require direct common iliac graft for access. Review of CTA suggest a conduit to the right common iliac artery and placement of 31 x 15 or possibility 34 x 15 cm gore tag device via a 22 french sheath. Patient has seen Dr Myra GianottiBrabham as vascular consult and pulmonary service has evaluated  bilateral air space disease  Patient has cardiology clearance by Dr. Delman KittenHocherian   The goals risks and alternatives of the planned surgical procedure thoracic stent graft  have been discussed with the patient in detail. The risks of the procedure including death, infection, stroke, myocardial infarction, bleeding, blood transfusion have all been discussed specifically.  I have quoted Debroah BallerArchie L Casciano a 5 % of perioperative mortality and a complication rate as high as 50 %. The patient's questions have been answered.Brick Kerri PerchesL Shankland is willing  to proceed with the planned procedure.  Delight OvensEdward B Declin Rajan MD      [address] [place] [id number] Office [phone]   Beeper [phone]  Dec 23, 2015  7:06 AM

## 2016-08-25 NOTE — Anesthesia Procedure Notes (Signed)
Central Venous Catheter Insertion Performed by: anesthesiologist Patient location: Pre-op. Preanesthetic checklist: patient identified, IV checked, site marked, risks and benefits discussed, surgical consent, monitors and equipment checked, pre-op evaluation, timeout performed and anesthesia consent Landmarks identified PA cath was placed.Swan type and PA catheter depth:thermodilation and 48PA Cath depth:48 Procedure performed using ultrasound guided technique. Attempts: 1 Patient tolerated the procedure well with no immediate complications.       

## 2016-08-25 NOTE — H&P (Signed)
 [facility] [place]  Patient name: [name] MRN: [id number] DOB: 09/30/1941 Sex: male  REASON FOR VISIT: follow up  HPI: [name] is a 75 y.o. male is a patient I have been evaluating with Dr. Gerhardt for thoracic stent graft to treat a descending thoracic aortic aneurysm.  He has a history of a left femoral to posterior tibial bypass graft with arm vein by Dr. Lawson.  After I saw him in the office, he presented to the hospital with an occluded bypass graft.  He underwent successful thrombus lysis.  The patient is currently undergoing a pulmonary workup for clearance for his surgery.  Currently, he is scheduled to get pulmonary function studies today.  He had a repeat CT scan which shows the pulmonary process is decreasing, suggesting this is inflammatory versus infectious rather than neoplastic.  On his CT scan, the aneurysm has increased in size from 5.5-5.7.  Past Medical History:  Diagnosis Date  . AAA (abdominal aortic aneurysm) (HCC)   . Anemia   . Anxiety   . Arthritis    Gout  . Atrial fibrillation (HCC)   . Barrett esophagus   . Carotid artery occlusion    Carotid endarterectomy 2002 and 2004  . COPD (chronic obstructive pulmonary disease) (HCC)   . Diabetes mellitus   . Diverticulosis   . DVT (deep venous thrombosis) (HCC)   . Erectile dysfunction   . GERD (gastroesophageal reflux disease)   . Gout   . Hiatal hernia   . History of stress test    March 2012 post stress ejection fraction 56%, negative for ischemia.  Last 2-D echocardiogram March 2012 younger than 55% mild concentric left ventricular hypertrophy grade 1 diastolic dysfunction. Mildly dilated left atrium. 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  . Hyperlipidemia   . Hypertension   . Lumbar stenosis   . PAD (peripheral artery disease) (HCC)    Fem-Fem by-pass graft 1991. redo patch angioplasty in 2000 and 2004.  LEA dopplers: 04/2012- R-ABI 1.34,  L-ABI 0.97, patent Left lower extremity graft.  . Pancreatitis   . Stroke (HCC) March 2016   Lihgt Stroke    Family History  Problem Relation Age of Onset  . Heart disease Father   . Heart attack Father     X's 3  . Hyperlipidemia Father   . Hypertension Father   . Diabetes Brother   . Hypertension Brother   . Cancer Sister     type unknown    SOCIAL HISTORY: Social History  Substance Use Topics  . Smoking status: Light Tobacco Smoker    Packs/day: 0.33    Years: 60.00    Types: Cigarettes, E-cigarettes  . Smokeless tobacco: Former User    Types: Chew    Quit date: 05/21/1968  . Alcohol use No    Allergies  Allergen Reactions  . Lopressor [Metoprolol Tartrate] Palpitations    LOPRESSOR bothers the patient.  . Niacin     Other reaction(s): Facial Edema (intolerance)    Current Outpatient Prescriptions  Medication Sig Dispense Refill  . ALPRAZolam (XANAX) 0.5 MG tablet Take 0.25 mg by mouth at bedtime as needed for anxiety.     . amLODipine (NORVASC) 5 MG tablet Take 5 mg by mouth daily.     . aspirin 81 MG tablet Take 81 mg by mouth daily.    . atorvastatin (LIPITOR) 80 MG tablet Take 40 mg by mouth.     . cilostazol (PLETAL) 100 MG tablet Take   100 mg by mouth 2 (two) times daily.    . cloNIDine (CATAPRES) 0.1 MG tablet Take 0.1 mg by mouth 3 (three) times daily.    . cloNIDine (CATAPRES) 0.3 MG tablet Take 0.1 mg by mouth 3 (three) times daily.   6  . docusate sodium (COLACE) 100 MG capsule Take 200 mg by mouth 2 (two) times daily.     . Ferrous Fumarate-Folic Acid (FERROCITE F PO) Take by mouth daily.    . glipiZIDE (GLUCOTROL) 5 MG tablet Take 5 mg by mouth 2 (two) times daily before a meal.    . losartan (COZAAR) 100 MG tablet Take 100 mg by mouth daily.    . metoprolol tartrate (LOPRESSOR) 25 MG tablet TAKE 1 TABLET BY MOUTH TWICE DAILY 180 tablet 0  . omega-3 acid ethyl esters (LOVAZA) 1 G capsule Take by mouth 2 (two) times daily. Takes 2 tablets    .  oxyCODONE 20 MG TABS Take 1 tablet (20 mg total) by mouth every 8 (eight) hours as needed for moderate pain. 10 tablet 0  . pantoprazole (PROTONIX) 40 MG tablet Take 40 mg by mouth 2 (two) times daily.     . propafenone (RYTHMOL) 225 MG tablet TAKE 1 TABLET BY MOUTH EVERY 8 HOURS 90 tablet 1  . propafenone (RYTHMOL) 225 MG tablet Take 225 mg by mouth 2 (two) times daily.    . saxagliptin HCl (ONGLYZA) 2.5 MG TABS tablet Take 2.5 mg by mouth daily.    . testosterone cypionate (DEPOTESTOTERONE CYPIONATE) 200 MG/ML injection Inject 100 mg into the skin every 14 (fourteen) days.     . Vitamin D, Ergocalciferol, (DRISDOL) 50000 UNITS CAPS capsule Take 50,000 Units by mouth every 7 (seven) days.    . Warfarin Sodium (COUMADIN PO) Take 4 mg by mouth daily. As directed     No current facility-administered medications for this visit.     REVIEW OF SYSTEMS:  [X] denotes positive finding, [ ] denotes negative finding Cardiac  Comments:  Chest pain or chest pressure:    Shortness of breath upon exertion:    Short of breath when lying flat:    Irregular heart rhythm:        Vascular    Pain in calf, thigh, or hip brought on by ambulation:    Pain in feet at night that wakes you up from your sleep:     Blood clot in your veins:    Leg swelling:         Pulmonary    Oxygen at home:    Productive cough:     Wheezing:         Neurologic    Sudden weakness in arms or legs:     Sudden numbness in arms or legs:     Sudden onset of difficulty speaking or slurred speech:    Temporary loss of vision in one eye:     Problems with dizziness:         Gastrointestinal    Blood in stool:     Vomited blood:         Genitourinary    Burning when urinating:     Blood in urine:        Psychiatric    Major depression:         Hematologic    Bleeding problems:    Problems with blood clotting too easily:        Skin    Rashes or ulcers:          Constitutional    Fever or chills:      PHYSICAL  EXAM: Vitals:   07/12/16 0820  BP: (!) 110/53  Pulse: 77  Resp: 12  Temp: 97.7 F (36.5 C)  TempSrc: Oral  SpO2: 98%  Weight: 136 lb (61.7 kg)  Height: 5' 8" (1.727 m)    GENERAL: The patient is a well-nourished male, in no acute distress. The vital signs are documented above. CARDIAC: There is a regular rate and rhythm.  VASCULAR: Palpable pulse within left femoral posterior tibial bypass graft PULMONARY: There is good air exchange bilaterally without wheezing or rales. ABDOMEN: Soft and non-tender with normal pitched bowel sounds.  MUSCULOSKELETAL: There are no major deformities or cyanosis. NEUROLOGIC: No focal weakness or paresthesias are detected. SKIN: There are no ulcers or rashes noted. PSYCHIATRIC: The patient has a normal affect.  DATA:  I have reviewed his CT scan which shows a slight enlargement of the descending thoracic aortic aneurysm, measuring 5.7 cm  MEDICAL ISSUES: Descending thoracic aortic aneurysm: I discussed proceeding with endovascular repair via a right retroperitoneal common iliac artery exposure.  The patient has severely calcified vessels and access is going to be his biggest issue.  We again discussed the risks and benefits of the operation including but not limited to arterial injury, bleeding, paralysis, stroke.  He understands this.  We will proceed once he obtains pulmonary clearance.  I am also sending him to see Dr. Kelly, his cardiologist to make sure he is cleared from a cardiac standpoint.  He will need to be off of his Coumadin.  I will plan on bridging him with Lovenox.  We will schedule his surgery once he is cleared medically.  He is scheduled to see Dr. Gerhardt later this week.    Wells Brabham, MD [facility]  Tel [phone] Pager [phone] 

## 2016-08-25 NOTE — Transfer of Care (Signed)
Immediate Anesthesia Transfer of Care Note  Patient: [name] BallerArchie L [name]  Procedure(s) Performed: Procedure(s): THORACIC AORTIC ENDOVASCULAR STENT GRAFT WITH RETROPERITONEAL EXPOSURE (N/A)  Patient Location: PACU  Anesthesia Type:General  Level of Consciousness: awake, alert  and oriented  Airway & Oxygen Therapy: Patient Spontanous Breathing and Patient connected to nasal cannula oxygen  Post-op Assessment: Report given to RN, Post -op Vital signs reviewed and stable and Patient moving all extremities  Post vital signs: Reviewed and stable  Last Vitals:  Vitals:   09/05/2016 0723 08/22/2016 1342  BP: (!) 127/59 (!) 130/48  Pulse: 61 61  Resp: 20 17  Temp: 36.6 C 36.5 C    Last Pain:  Vitals:   08/26/2016 1342  TempSrc:   PainSc: 0-No pain      Patients Stated Pain Goal: 4 (08/16/2016 16100712)  Complications: No apparent anesthesia complications

## 2016-08-25 NOTE — Anesthesia Postprocedure Evaluation (Signed)
Anesthesia Post Note  Patient: [name]  Procedure(s) Performed: Procedure(s) (LRB): THORACIC AORTIC ENDOVASCULAR STENT GRAFT WITH RETROPERITONEAL EXPOSURE (N/A)  Patient location during evaluation: PACU Anesthesia Type: General Level of consciousness: awake and alert Pain management: pain level controlled Vital Signs Assessment: post-procedure vital signs reviewed and stable Respiratory status: spontaneous breathing, nonlabored ventilation, respiratory function stable and patient connected to nasal cannula oxygen Cardiovascular status: blood pressure returned to baseline and stable Postop Assessment: no signs of nausea or vomiting Anesthetic complications: no Comments: Doing great, vital signs stable with BP in target range, no signs of stroke, moves legs bilaterally, pain controlled    Last Vitals:  Vitals:   08/15/2016 1435 08/19/2016 1450  BP: (!) 138/50 (!) 133/52  Pulse: (!) 56 (!) 55  Resp: 15 12  Temp:      Last Pain:  Vitals:   09/11/2016 1500  TempSrc:   PainSc: Asleep                 Reino KentJudd, Diasia Henken J

## 2016-08-25 NOTE — Progress Notes (Signed)
      [address]       [place] [id number]             [phone]      Visiting with family  BP (!) 149/51 (BP Location: Left Arm)   Pulse (!) 56   Temp 97 F (36.1 C)   Resp 16   Ht 5\' 9"  (1.753 m) Comment: Simultaneous filing. User may not have seen previous data.  Wt 132 lb (59.9 kg) Comment: Simultaneous filing. User may not have seen previous data.  SpO2 100%   BMI 19.49 kg/m    Intake/Output Summary (Last 24 hours) at 08/24/2016 1803 Last data filed at 08/16/2016 1548  Gross per 24 hour  Intake             1800 ml  Output              845 ml  Net              955 ml    S/p stent graft Continue current care  Oluwatimilehin Balfour C. Dorris FetchHendrickson, MD [facility] [phone]

## 2016-08-25 NOTE — Op Note (Signed)
Patient name: [name] MRN: [id number] DOB: January 31, 1941 Sex: male  12-06-2015 Pre-operative Diagnosis: Thoracic aortic aneurysm Post-operative diagnosis:  Same Surgeon:  Durene CalBrabham, Wells Co-Surgeon:  Dr. Tyrone SageGerhardt Procedure:   #1: Retroperitoneal exposure of the right common, external, and internal iliac artery   #2: Catheter in aorta 2   #3: Thoracic aortic angiogram   #4: Stent, descending thoracic aorta Anesthesia:  Gen. Blood Loss:  See anesthesia record Specimens:  None  Findings:  Complete exclusion of the aneurysm.  The patient had a heavily calcified aorta, common iliac and external iliac artery.  A conduit was not placed because of the difficulty obtaining control.  Therefore direct puncture of the common iliac artery was performed.  There was a soft area on the anterior lateral surface for about 2 cm.  This was repaired primarily with interrupted pledgeted 5-0 proline sutures.  Devices used: Gore CTAG [id number]  Indications:  The patient has been found to have a 5.5 cm saccular descending thoracic aortic aneurysm.  He also has significant underlying pulmonary dysfunction including possible early pneumonia and possible underlying malignancy.  He has a history of a aneurysmal femoral-tibial bypass graft on the left which had recently occluded and reopened with thrombus lysis.  He has been cleared to proceed with thoracic aortic stenting.  Procedure:  The patient was identified in the holding area and [facility]  The patient was then placed supine on the table. general anesthesia was administered.  The patient was prepped and draped in the usual sterile fashion.  A time out was called and antibiotics were administered.  A right lower quadrant incision was made just below the umbilicus with a midline lateral.  The fascia was divided with cautery exposing the rectus muscle.  The rectus was mobilized on the medial and lateral side.  I elected to enter the retroperitoneal space  from lateral to the rectus muscle.  A small rent within the peritoneum was created and repaired with 3-0 Vicryl suture.  I was ultimately able to identify the external iliac artery which was heavily calcified, circumferentially.  I then proceeded with sharp and blunt dissection proximally up to the common iliac artery.  A Omni tract retractor was used to aid with exposure.  The dissection was carried up to the aortic bifurcation.  The hypogastric artery was also individually isolated.  The iliac vein was densely adherent to the posterior side of the right external iliac artery at the level of the hypogastric artery.  Because of the amount of calcification, I did not feel that it would be feasible to sew a conduit onto the common iliac artery because of the inability to get proximal control.  Therefore the decision was made to perform direct cannulation of the common iliac artery with plans for arteriotomy closure with a aortic occlusion balloon in place.  At this point, the patient was fully heparinized.  A stab incision was made in the right lower quadrant and a tunnel was created into the retroperitoneal space.  An 18-gauge needle was used to cannulate the right common iliac artery.  An 10035 Bentson wire was advanced without resistance.  The wire was then grabbed by a hemostat and brought out through the stab incision.  An 8 French sheath was then placed.  The wire was advanced into the ascending aorta.  Using a Berenstein 2 catheter, a Lunderquist double curve wire was placed.  The 8 French sheath was removed and a 22 JamaicaFrench sheath  was inserted without difficulty.  A second access into the dry seal sheath was obtained.  A Omni flush catheter was placed into the descending thoracic aorta and a arteriogram was performed which showed the location of the aneurysm as well as the celiac artery.  Next the device was prepared on the back table.  This was a Gore CTAG 34 x 20 device.  It was advanced over the wire and  placed into position and deployed.  A completion arteriogram was performed.  This showed exclusion of aneurysm with preservation of the celiac artery.  At this point catheters and wires were removed.  A Q-50 balloon was then inserted and inflated at the aortic bifurcation.  A 22 French sheath was removed.  The external iliac artery was occluded with a Potts vessel loop, and the internal iliac artery was occluded with a Hanley clamp.  There was adequate hemostasis although there was still antegrade blood flow.  Approximately 5 double pledgeted 5-0 Prolene sutures were placed.  The lateral and medial sutures were secured and then the Q-50 balloon was let down and removed.  The wire was also removed.  The remaining sutures were then tied down and there appeared to be adequate hemostasis.  One additional suture had to be placed on the lateral side for hemostasis.  The patient had excellent Doppler signal in the common and external iliac artery as well as a palpable femoral pulse.  50 mg of protamine was used to reverse the heparin.  We used Flo-seal to help with hemostasis.  Once we were satisfied with hemostasis the retractors were removed.  The abdominal wall fascia was closed with running #1 PDS suture.  The subcutaneous tissue was closed with 2-0 Vicryl and the skin was closed with 4-0 Vicryl.  The stab incision was closed with Dermabond.  The patient was successfully extubated and taken the recovery room in stable condition.  There were no immediate complications.   Disposition:  To PACU in stable condition.   Juleen China, M.D. [facility] [place] Office: [phone] Pager:  [phone]

## 2016-08-25 NOTE — Anesthesia Procedure Notes (Signed)
Central Venous Catheter Insertion Performed by: anesthesiologist Patient location: Pre-op. Preanesthetic checklist: patient identified, IV checked, site marked, risks and benefits discussed, surgical consent, monitors and equipment checked, pre-op evaluation, timeout performed and anesthesia consent Position: Trendelenburg Lidocaine 1% used for infiltration Landmarks identified Catheter size: 9 Fr Sheath introducer Procedure performed using ultrasound guided technique. Attempts: 1 Following insertion, line sutured and dressing applied. Post procedure assessment: blood return through all ports, free fluid flow and no air. Patient tolerated the procedure well with no immediate complications.       

## 2016-08-25 NOTE — Anesthesia Procedure Notes (Signed)
Procedure Name: Intubation Date/Time: 2016-02-27 9:08 AM Performed by: Orvilla FusATO, Sima Lindenberger A Pre-anesthesia Checklist: Patient identified, Suction available, Emergency Drugs available, Patient being monitored and Timeout performed Patient Re-evaluated:Patient Re-evaluated prior to inductionOxygen Delivery Method: Circle system utilized Preoxygenation: Pre-oxygenation with 100% oxygen Ventilation: Mask ventilation without difficulty and Oral airway inserted - appropriate to patient size Laryngoscope Size: Mac and 4 Grade View: Grade I Tube type: Subglottic suction tube Tube size: 8.0 mm Number of attempts: 1 Airway Equipment and Method: Stylet Placement Confirmation: ETT inserted through vocal cords under direct vision,  positive ETCO2 and breath sounds checked- equal and bilateral Secured at: 23 cm Tube secured with: Tape Dental Injury: Teeth and Oropharynx as per pre-operative assessment

## 2016-08-25 NOTE — Progress Notes (Signed)
Patient ID: [name], male   DOB: 01/27/1941, 75 y.o.   MRN: [id number] EVENING ROUNDS NOTE :     [address]       [place] [id number]             [phone]                 Day of Surgery Procedure(s) (LRB): THORACIC AORTIC ENDOVASCULAR STENT GRAFT WITH RETROPERITONEAL EXPOSURE (N/A)  Total Length of Stay:  LOS: 0 days  BP (!) 149/51 (BP Location: Left Arm)   Pulse (!) 56   Temp 97 F (36.1 C)   Resp 16   Ht 5\' 9"  (1.753 m) Comment: Simultaneous filing. User may not have seen previous data.  Wt 132 lb (59.9 kg) Comment: Simultaneous filing. User may not have seen previous data.  SpO2 100%   BMI 19.49 kg/m   .Intake/Output      10/10 0701 - 10/11 0700 10/11 0701 - 10/12 0700   I.V. (mL/kg)  1300 (21.7)   IV Piggyback  500   Total Intake(mL/kg)  1800 (30.1)   Urine (mL/kg/hr)  295 (0.4)   Blood  550 (0.8)   Total Output   845   Net   +955          . sodium chloride    . sodium chloride 50 mL/hr (09/08/2016 1708)     Lab Results  Component Value Date   WBC 16.2 (H) 09/05/2016   HGB 8.3 (L) 08/18/2016   HCT 24.8 (L) 08/29/2016   PLT 150 08/29/2016   GLUCOSE 120 (H) 08/18/2016   ALT 18 08/17/2016   AST 28 08/17/2016   NA 139 09/10/2016   K 4.0 08/22/2016   CL 107 09/04/2016   CREATININE 1.44 (H) 08/29/2016   BUN 25 (H) 08/28/2016   CO2 24 09/08/2016   INR 1.31 09/03/2016   HGBA1C 6.5 (H) 08/17/2016   Stable in unit extubate awake and neuro intact Incision intact usual postop pain . Left leg graft with palpable pulse   Delight OvensEdward B Asmar Brozek MD  Beeper [phone] Office [phone] 08/28/2016 6:18 PM

## 2016-08-26 ENCOUNTER — Encounter (HOSPITAL_COMMUNITY): Payer: Self-pay | Admitting: Surgery

## 2016-08-26 DIAGNOSIS — E43 Unspecified severe protein-calorie malnutrition: Secondary | ICD-10-CM | POA: Insufficient documentation

## 2016-08-26 LAB — CBC
HCT: 23.8 % — ABNORMAL LOW (ref 39.0–52.0)
Hemoglobin: 8 g/dL — ABNORMAL LOW (ref 13.0–17.0)
MCH: 30.7 pg (ref 26.0–34.0)
MCHC: 33.6 g/dL (ref 30.0–36.0)
MCV: 91.2 fL (ref 78.0–100.0)
PLATELETS: 156 10*3/uL (ref 150–400)
RBC: 2.61 MIL/uL — ABNORMAL LOW (ref 4.22–5.81)
RDW: 14.5 % (ref 11.5–15.5)
WBC: 17.8 10*3/uL — AB (ref 4.0–10.5)

## 2016-08-26 LAB — BASIC METABOLIC PANEL
ANION GAP: 10 (ref 5–15)
BUN: 27 mg/dL — ABNORMAL HIGH (ref 6–20)
CALCIUM: 8.8 mg/dL — AB (ref 8.9–10.3)
CO2: 22 mmol/L (ref 22–32)
CREATININE: 1.5 mg/dL — AB (ref 0.61–1.24)
Chloride: 107 mmol/L (ref 101–111)
GFR, EST AFRICAN AMERICAN: 51 mL/min — AB (ref >60)
GFR, EST NON AFRICAN AMERICAN: 44 mL/min — AB (ref >60)
Glucose, Bld: 160 mg/dL — ABNORMAL HIGH (ref 65–99)
Potassium: 3.9 mmol/L (ref 3.5–5.1)
SODIUM: 139 mmol/L (ref 135–145)

## 2016-08-26 MED ORDER — OXYCODONE HCL 5 MG PO TABS: 20.0000 mg | ORAL_TABLET | Freq: Three times a day (TID) | ORAL | Status: DC | PRN

## 2016-08-26 MED ORDER — ORAL CARE MOUTH RINSE
15.0000 mL | Freq: Two times a day (BID) | OROMUCOSAL | Status: DC
  Administered 2016-08-26 – 2016-09-07 (×19): 15 mL via OROMUCOSAL

## 2016-08-26 MED ORDER — CHLORHEXIDINE GLUCONATE 0.12 % MT SOLN
15.0000 mL | Freq: Two times a day (BID) | OROMUCOSAL | Status: DC
  Administered 2016-08-27 – 2016-09-07 (×22): 15 mL via OROMUCOSAL
  Filled 2016-08-26 (×18): qty 15

## 2016-08-26 MED ORDER — ALUM HYDROXIDE-MAG CARBONATE 95-358 MG/15ML PO SUSP
15.0000 mL | Freq: Three times a day (TID) | ORAL | Status: AC | PRN
  Administered 2016-08-26 – 2016-08-28 (×2): 15 mL via ORAL
  Filled 2016-08-26 (×3): qty 15

## 2016-08-26 MED ORDER — OXYCODONE HCL 5 MG PO TABS
20.0000 mg | ORAL_TABLET | Freq: Four times a day (QID) | ORAL | Status: DC | PRN
  Administered 2016-08-26 – 2016-09-01 (×13): 20 mg via ORAL
  Filled 2016-08-26 (×13): qty 4

## 2016-08-26 MED ORDER — NITROGLYCERIN IN D5W 200-5 MCG/ML-% IV SOLN
0.0000 ug/min | INTRAVENOUS | Status: DC
  Administered 2016-08-26: 70 ug/min via INTRAVENOUS
  Administered 2016-08-26: 60 ug/min via INTRAVENOUS
  Administered 2016-08-28 (×2): 80 ug/min via INTRAVENOUS
  Administered 2016-08-28: 75 ug/min via INTRAVENOUS
  Administered 2016-08-28: 80 ug/min via INTRAVENOUS
  Administered 2016-08-30 (×2): 110 ug/min via INTRAVENOUS
  Administered 2016-08-30: 50 ug/min via INTRAVENOUS
  Administered 2016-08-31: 140 ug/min via INTRAVENOUS
  Administered 2016-08-31: 105 ug/min via INTRAVENOUS
  Administered 2016-08-31 (×2): 140 ug/min via INTRAVENOUS
  Administered 2016-09-01: 100 ug/min via INTRAVENOUS
  Administered 2016-09-01: 145 ug/min via INTRAVENOUS
  Administered 2016-09-01: 155 ug/min via INTRAVENOUS
  Administered 2016-09-01 – 2016-09-02 (×3): 80 ug/min via INTRAVENOUS
  Administered 2016-09-03: 100 ug/min via INTRAVENOUS
  Administered 2016-09-03 – 2016-09-04 (×2): 110 ug/min via INTRAVENOUS
  Filled 2016-08-26 (×23): qty 250

## 2016-08-26 MED ORDER — HYDRALAZINE HCL 20 MG/ML IJ SOLN
5.0000 mg | INTRAMUSCULAR | Status: DC | PRN
  Administered 2016-08-26 – 2016-08-27 (×3): 5 mg via INTRAVENOUS
  Filled 2016-08-26 (×2): qty 1

## 2016-08-26 MED ORDER — FAMOTIDINE IN NACL 20-0.9 MG/50ML-% IV SOLN
20.0000 mg | Freq: Once | INTRAVENOUS | Status: AC
  Administered 2016-08-26: 20 mg via INTRAVENOUS
  Filled 2016-08-26: qty 50

## 2016-08-26 NOTE — Progress Notes (Signed)
Notified Dr. Imogene Burnhen pt complaining of persistent abdominal pain related to inability to pass gas. Per MD, will give 20mg  Pepcid IV and increase Oxy to be given q 6 hours. Will continue to monitor closely.

## 2016-08-26 NOTE — Progress Notes (Signed)
Notified MD of patient's BP maintaining in 160-180's systolic. During phone conversation informed of current BP 161/59 - Cardiothoracic MD verified that BP of 160's systolic is acceptable for patient. No PRN medication orders given - will continue to monitor and give scheduled medications.  Francia GreavesSavannah R Firmin Belisle, RN

## 2016-08-26 NOTE — Progress Notes (Signed)
Patient ID: [name], male   DOB: [birth year], 75 y.o.   MRN: [id number] TCTS DAILY ICU PROGRESS NOTE                   [address]            [place] [address]          [phone]   1 Day Post-Op Procedure(s) (LRB): THORACIC AORTIC ENDOVASCULAR STENT GRAFT WITH RETROPERITONEAL EXPOSURE (N/A)  Total Length of Stay:  LOS: 1 day   Subjective: Alert and awake   Objective: Vital signs in last 24 hours: Temp:  [96.8 F (36 C)-99.9 F (37.7 C)] 99.5 F (37.5 C) (10/12 0739) Pulse Rate:  [49-85] 76 (10/12 0739) Cardiac Rhythm: Normal sinus rhythm (10/12 0400) Resp:  [11-25] 19 (10/12 0739) BP: (125-168)/(45-105) 164/65 (10/12 0739) SpO2:  [94 %-100 %] 96 % (10/12 0739) Arterial Line BP: (117-254)/(35-52) 179/47 (10/12 0739)  Filed Weights   09/21/2016 0723  Weight: 132 lb (59.9 kg)    Weight change:    Hemodynamic parameters for last 24 hours: PAP: (13-25)/(2-13) 24/4 CO:  [3.4 L/min-4.1 L/min] 4.1 L/min CI:  [2 L/min/m2-2.3 L/min/m2] 2.3 L/min/m2  Intake/Output from previous day: 10/11 0701 - 10/12 0700 In: 2840.1 [I.V.:2240.1; IV Piggyback:600] Out: 0102 [Urine:1335; Blood:550]  Intake/Output this shift: No intake/output data recorded.  Current Meds: Scheduled Meds: . amLODipine  5 mg Oral Q supper  . aspirin EC  81 mg Oral QPM  . atorvastatin  40 mg Oral QPM  . cloNIDine  0.1 mg Oral TID  . docusate sodium  100 mg Oral Daily  . levofloxacin  750 mg Oral Daily  . linagliptin  5 mg Oral Daily  . losartan  100 mg Oral Q lunch  . mouth rinse  15 mL Mouth Rinse BID  . metoprolol tartrate  25 mg Oral BID  . mometasone-formoterol  2 puff Inhalation BID  . omega-3 acid ethyl esters  2 g Oral BID  . pantoprazole  40 mg Oral BID  . pantoprazole  40 mg Oral Daily  . propafenone  225 mg Oral BID  . [START ON 08/27/2016] Vitamin D (Ergocalciferol)  50,000 Units Oral Q7 days   Continuous Infusions: . sodium chloride    . sodium chloride 50  mL/hr at 08/26/16 0400   PRN Meds:.sodium chloride, acetaminophen **OR** acetaminophen, alum & mag hydroxide-simeth, docusate sodium, guaiFENesin-dextromethorphan, hydrALAZINE, morphine injection, ondansetron, oxyCODONE, phenol, potassium chloride, tamsulosin  General appearance: alert and cooperative Neurologic: intact Heart: regular rate and rhythm, S1, S2 normal, no murmur, click, rub or gallop Lungs: diminished breath sounds bibasilar Abdomen: soft, non-tender; bowel sounds normal; no masses,  no organomegaly Extremities: extremities normal, atraumatic, no cyanosis or edema and Homans sign is negative, no sign of DVT Wound: ntact Left leg graft with palpable pulse   Lab Results: CBC: Recent Labs  2016/09/21 1700 08/26/16 0400  WBC 16.2* 17.8*  HGB 8.3* 8.0*  HCT 24.8* 23.8*  PLT 150 156   BMET:  Recent Labs  Sep 21, 2016 1700 08/26/16 0400  NA 139 139  K 4.0 3.9  CL 107 107  CO2 24 22  GLUCOSE 120* 160*  BUN 25* 27*  CREATININE 1.44* 1.50*  CALCIUM 9.3 8.8*    CMET: Lab Results  Component Value Date   WBC 17.8 (H) 08/26/2016   HGB 8.0 (L) 08/26/2016   HCT 23.8 (L) 08/26/2016   PLT 156 08/26/2016   GLUCOSE 160 (H) 08/26/2016  ALT 18 08/17/2016   AST 28 08/17/2016   NA 139 08/26/2016   K 3.9 08/26/2016   CL 107 08/26/2016   CREATININE 1.50 (H) 08/26/2016   BUN 27 (H) 08/26/2016   CO2 22 08/26/2016   INR 1.31 08/27/2016   HGBA1C 6.5 (H) 08/17/2016    PT/INR:  Recent Labs  09/06/2016 1700  LABPROT 16.4*  INR 1.31   Radiology: Dg Chest Port 1 View  Result Date: 08/28/2016 CLINICAL DATA:  Central line placement. EXAM: PORTABLE CHEST 1 VIEW COMPARISON:  08/23/2016 FINDINGS: 1701 hours. Right IJ pulmonary artery catheter tip is positioned in the main pulmonary artery. Right scapula superimposed on the right mid lung in a region of rib fracture, mimicking pleural line. The cardiopericardial silhouette is within normal limits for size. Intra-aortic stent graft  noted in the descending thoracic aorta, new in the interval. No pulmonary edema or focal airspace consolidation. No evidence for pneumothorax. No substantial pleural effusion. IMPRESSION: Right IJ pulmonary artery catheter tip is in the main pulmonary artery. No evidence for pneumothorax. Electronically Signed   By: Kennith CenterEric  Mansell M.D.   On: 09/12/2016 17:10   Chronic Kidney Disease   Stage I     GFR >90  Stage II    GFR 60-89  Stage IIIA GFR 45-59  Stage IIIB GFR 30-44  Stage IV   GFR 15-29  Stage V    GFR  <15  Lab Results  Component Value Date   CREATININE 1.50 (H) 08/26/2016   Estimated Creatinine Clearance: 36.1 mL/min (by C-G formula based on SCr of 1.5 mg/dL (H)).    Assessment/Plan: S/P Procedure(s) (LRB): THORACIC AORTIC ENDOVASCULAR STENT GRAFT WITH RETROPERITONEAL EXPOSURE (N/A) Mobilize d/c tubes/lines Expected Acute  Blood - loss Anemia Monitor cr    [name] 08/26/2016 8:05 AM

## 2016-08-26 NOTE — Progress Notes (Addendum)
Subjective  - POD #1  Feels comfortable.  C/o soreness in abdomen ? Flatus Belching helped his abd pain   Physical Exam:  Palpable left fem tib graft oulse Right leg warm abd soft   Incision clean and intact       Assessment/Plan:  POD #1  Acute blood loss anemia:  HCT 23.  Will monitor for now, no  Indication for transfusion Anticoagulation:  Pt on coumadin for AFIB and h/o stroke.  Will restart tomorrow with full dose lovenox if no evidence of bleeding HTN:  On NTG last night, BP remains elevated.  Restart home meds.  Goal is SBP<130 GI:  Sips only today, awaiting return of bowel function ID:  Pt started on Levaquin by pulmonary as an outpatient due to pre-op XRAY findings.  Will continue these while he in in house Chronic renal insufficiency (stage III):  Cr slightly up today (1.5) after dye load yesterday.  He received approximately 40 cc of contrast.  Continue with gentle hydration D/C A-line and [place] in ICU 1 more day given pulmonary concerns.  Aggressive pulm toilet OOB to chair and ambulating today Protein calorie malnutrition, will add supplements once taking PO  Brabham, Wells 08/26/2016 9:54 AM --  Vitals:   08/26/16 0830 08/26/16 0900  BP: (!) 148/58 (!) 166/61  Pulse: 73 82  Resp: 20 17  Temp: 99.3 F (37.4 C) 99.3 F (37.4 C)    Intake/Output Summary (Last 24 hours) at 08/26/16 0954 Last data filed at 08/26/16 0700  Gross per 24 hour  Intake          2840.08 ml  Output             1885 ml  Net           955.08 ml     Laboratory CBC    Component Value Date/Time   WBC 17.8 (H) 08/26/2016 0400   HGB 8.0 (L) 08/26/2016 0400   HCT 23.8 (L) 08/26/2016 0400   PLT 156 08/26/2016 0400    BMET    Component Value Date/Time   NA 139 08/26/2016 0400   K 3.9 08/26/2016 0400   CL 107 08/26/2016 0400   CO2 22 08/26/2016 0400   GLUCOSE 160 (H) 08/26/2016 0400   BUN 27 (H) 08/26/2016 0400   CREATININE 1.50 (H) 08/26/2016 0400   CALCIUM 8.8 (L) 08/26/2016 0400   GFRNONAA 44 (L) 08/26/2016 0400   GFRAA 51 (L) 08/26/2016 0400    COAG Lab Results  Component Value Date   INR 1.31 13-Jan-2016   INR 1.19 13-Jan-2016   INR 2.07 06/19/2016   No results found for: PTT  Antibiotics Anti-infectives    Start     Dose/Rate Route Frequency Ordered Stop   08/26/16 1000  levofloxacin (LEVAQUIN) tablet 750 mg  Status:  Discontinued     750 mg Oral Daily 09/24/16 1624 08/26/16 0809   09/24/16 1630  cefUROXime (ZINACEF) 1.5 g in dextrose 5 % 50 mL IVPB     1.5 g 100 mL/hr over 30 Minutes Intravenous Every 12 hours 09/24/16 1624 08/26/16 0430   09/24/16 0705  dextrose 5 % with cefUROXime (ZINACEF) ADS Med    Comments:  Scronce, Trina   : cabinet override      09/24/16 0705 09/24/16 0842   09/24/16 0604  cefUROXime (ZINACEF) 1.5 g in dextrose 5 % 50 mL IVPB     1.5 g 100 mL/hr over 30 Minutes Intravenous 30 min pre-op  09/11/2016 0604 08/29/2016 0842       V. Charlena Cross, M.D. [facility] Office: [phone] Pager:  [phone]

## 2016-08-26 NOTE — Progress Notes (Signed)
Initial Nutrition Assessment  DOCUMENTATION CODES:   Severe malnutrition in context of chronic illness  INTERVENTION:  RD to order nutritional supplements once diet advances.   NUTRITION DIAGNOSIS:   Malnutrition related to chronic illness as evidenced by severe depletion of muscle mass, severe depletion of body fat.  GOAL:   Patient will meet greater than or equal to 90% of their needs  MONITOR:   Diet advancement, Labs, Weight trends, Skin, I & O's  REASON FOR ASSESSMENT:   Malnutrition Screening Tool    ASSESSMENT:   75 year old male, PMH DM, COPD, GERD, HTN, pancreatitis, stroke. Patient has known severe peripheral vascular disease followed by  Dr. Hart RochesterLawson for  his left lower extremity femoral to posterior tibial bypass which was done using composite bilateral cephalic vein grafts in 2007. He has had previous left carotid endarterectomy and redo left carotid endarterectomy most recent of which was 2007.  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 Pt is allowed to only take sip of liquids as awaiting bowel function prior to diet advancement. Pt with abdominal pains during time of visit. Pt reports usual consumption of at least 2-3 meals a day PTA at home. Diet recall at breakfast includes 2 eggs, bologna, tomato slices, and toast; lunch includes a glass of milk and a pack of peanut butter crackers; dinner includes a "hearty meals" as report from wife at bedside. Pt reports weight loss with usual body weight of ~205 lbs which he reports last weighing 2 years ago, however per weight records, weight has been stable. Pt reports consuming Glucerna Shake at home at times, however does say it causes heart burn at times. RD to order nutritional supplements once diet advances.   Nutrition-Focused physical exam completed. Findings are severe fat depletion,  severe muscle depletion, and no edema.   Labs and medications reviewed.   Diet Order:  Diet NPO time specified  Skin:   (Incision on abdomen)  Last BM:  10/9  Height:   Ht Readings from Last 1 Encounters:  10/10/16 5\' 9"  (1.753 m)    Weight:   Wt Readings from Last 1 Encounters:  10/10/16 132 lb (59.9 kg)    Ideal Body Weight:  72.7 kg  BMI:  Body mass index is 19.49 kg/m.  Estimated Nutritional Needs:   Kcal:  1800-2000  Protein:  80-95 grams  Fluid:  1.8-2 L/day  EDUCATION NEEDS:   No education needs identified at this time  Roslyn SmilingStephanie Hadlea Furuya, MS, RD, LDN Pager # [phone] After hours/ weekend pager # [phone]

## 2016-08-26 NOTE — Progress Notes (Signed)
Patient ID: [name], male   DOB: 21-Dec-1940, 75 y.o.   MRN: [id number]  SICU Evening Rounds:  Hypertensive on IV NTG.  He has had significant pain in his LLQ today that he says is improved after belching. Not passing flatus yet. Taking a lot of pain medication.  Abdomen is firm but flat. Incision looks good. Tender to palpation LLQ. Good BS.   Urine output ok  This pain is most likely gas since it comes and goes and should resolve once he starts passing flatus. Continue pain meds, observation.

## 2016-08-26 NOTE — Progress Notes (Signed)
Noticed it is documented that pt is allergic to lopressor, contacted/notified pharm MD - stated risk vs. benefit that it is acceptable to give pt this medication as ordered. Pt vital signs stable, w/o complaint of palpitations.   Will continue to monitor closely.   Francia GreavesSavannah R Carlyn Lemke, RN

## 2016-08-27 ENCOUNTER — Inpatient Hospital Stay (HOSPITAL_COMMUNITY): Payer: Medicare Other

## 2016-08-27 LAB — BASIC METABOLIC PANEL
Anion gap: 8 (ref 5–15)
BUN: 30 mg/dL — ABNORMAL HIGH (ref 6–20)
CO2: 22 mmol/L (ref 22–32)
Calcium: 8.5 mg/dL — ABNORMAL LOW (ref 8.9–10.3)
Chloride: 108 mmol/L (ref 101–111)
Creatinine, Ser: 1.53 mg/dL — ABNORMAL HIGH (ref 0.61–1.24)
GFR calc Af Amer: 50 mL/min — ABNORMAL LOW (ref >60)
GFR calc non Af Amer: 43 mL/min — ABNORMAL LOW (ref >60)
Glucose, Bld: 231 mg/dL — ABNORMAL HIGH (ref 65–99)
Potassium: 3.5 mmol/L (ref 3.5–5.1)
Sodium: 138 mmol/L (ref 135–145)

## 2016-08-27 LAB — CBC
HCT: 21.2 % — ABNORMAL LOW (ref 39.0–52.0)
Hemoglobin: 7.2 g/dL — ABNORMAL LOW (ref 13.0–17.0)
MCH: 30.8 pg (ref 26.0–34.0)
MCHC: 34 g/dL (ref 30.0–36.0)
MCV: 90.6 fL (ref 78.0–100.0)
Platelets: 134 10*3/uL — ABNORMAL LOW (ref 150–400)
RBC: 2.34 MIL/uL — ABNORMAL LOW (ref 4.22–5.81)
RDW: 14.6 % (ref 11.5–15.5)
WBC: 22.7 10*3/uL — ABNORMAL HIGH (ref 4.0–10.5)

## 2016-08-27 LAB — GLUCOSE, CAPILLARY
GLUCOSE-CAPILLARY: 311 mg/dL — AB (ref 65–99)
GLUCOSE-CAPILLARY: 176 mg/dL — AB (ref 65–99)
GLUCOSE-CAPILLARY: 149 mg/dL — AB (ref 65–99)
Glucose-Capillary: 149 mg/dL — ABNORMAL HIGH (ref 65–99)

## 2016-08-27 LAB — TYPE AND SCREEN
ABO/RH(D): A POS
ANTIBODY SCREEN: NEGATIVE

## 2016-08-27 LAB — PREPARE RBC (CROSSMATCH)

## 2016-08-27 MED ORDER — HYDRALAZINE HCL 20 MG/ML IJ SOLN
10.0000 mg | INTRAMUSCULAR | Status: DC | PRN
  Administered 2016-08-27 – 2016-09-04 (×31): 10 mg via INTRAVENOUS
  Filled 2016-08-27 (×31): qty 1

## 2016-08-27 MED ORDER — ENOXAPARIN SODIUM 60 MG/0.6ML ~~LOC~~ SOLN
60.0000 mg | Freq: Two times a day (BID) | SUBCUTANEOUS | Status: DC
  Administered 2016-08-27 – 2016-09-07 (×20): 60 mg via SUBCUTANEOUS
  Filled 2016-08-27 (×21): qty 0.6

## 2016-08-27 MED ORDER — INSULIN ASPART 100 UNIT/ML ~~LOC~~ SOLN
0.0000 [IU] | Freq: Three times a day (TID) | SUBCUTANEOUS | Status: DC
  Administered 2016-08-27: 7 [IU] via SUBCUTANEOUS
  Administered 2016-08-27: 2 [IU] via SUBCUTANEOUS
  Administered 2016-08-27: 1 [IU] via SUBCUTANEOUS
  Administered 2016-08-28 (×3): 3 [IU] via SUBCUTANEOUS
  Administered 2016-08-29: 2 [IU] via SUBCUTANEOUS
  Administered 2016-08-29 – 2016-08-30 (×3): 3 [IU] via SUBCUTANEOUS

## 2016-08-27 MED ORDER — CLONIDINE HCL 0.2 MG PO TABS
0.2000 mg | ORAL_TABLET | Freq: Three times a day (TID) | ORAL | Status: DC
  Administered 2016-08-27 – 2016-09-01 (×18): 0.2 mg via ORAL
  Filled 2016-08-27 (×2): qty 1
  Filled 2016-08-27: qty 2
  Filled 2016-08-27 (×16): qty 1

## 2016-08-27 NOTE — Care Management Note (Addendum)
Case Management Note  Patient Details  Name: Camilo L Encinas MRN: [id number] Date of Birth: 04/06/1941  Subjective/Objective:    Debroah BallerS/p TEVAR, Lives with wife, pta indep, has pcp, medication coverage and transportation at dc.  He [facility] pre [facility] at [place] [facility] but patient and wife state they do not see any need for this , they do not want this, NCM informed Tiffany with [facility].   NCM will cont to follow for dc needs.    10/17 Letha Capeeborah Charlii Yost RN, CM- spoke with patient , he states he will see how he  is at discharge and decide then if he wants [facility].  Please check with patient if he chooses [facility] let Tiffany with [facility] know  [phone], will not need any orders since was already set up. ( was already pre set up at [place] office).                Action/Plan:   Expected Discharge Date:                  Expected Discharge Plan:  Home w Home Health Services  In-House Referral:     Discharge planning Services  CM Consult  Post Acute Care Choice:    Choice offered to:     DME Arranged:    DME Agency:     HH Arranged:    HH Agency:     Status of Service:  Completed, signed off  If discussed at [employer] Length of Stay Meetings, dates discussed:    Additional Comments:  Leone Havenaylor, Clent Damore Clinton, RN 08/27/2016, 4:42 PM

## 2016-08-27 NOTE — Progress Notes (Addendum)
Subjective  - POD #2, s/p TEVAR wth RP incision  C/o LLW pain No flatus Walked 10 feet yesterday   Physical Exam:  abd slightly more distended today Palpable femoral pulses Respirations non-labored       Assessment/Plan:  POD #2  Acute blood loss anemia:  HCT 23.  Will monitor for now, no  Indication for transfusion Anticoagulation:  Pt on coumadin for AFIB and h/o stroke.  restart full dose lovenox  HTN:  On NTG , BP remains elevated.  Restart home meds.  Goal is SBP<150.  Increase clonidine to 0.2, increase hydralazine to 10 mg, wean NTG GI:  Sips only today, awaiting return of bowel function, check KUB ID:  Pt started on Levaquin by pulmonary as an outpatient due to pre-op XRAY findings.  Will continue these while he in in house Chronic renal insufficiency (stage III):  Cr slightly up today (1.5) after dye load yesterday.  He received approximately 40 cc of contrast.  Continue with gentle hydration Aggressive pulm toilet, check CXR OOB to chair and ambulating today Protein calorie malnutrition, will add supplements once taking PO Diabetes:  check glc and treat with sliding scale Transfer to step down  Delman Goshorn, Wells 08/27/2016 7:59 AM --  Vitals:   08/27/16 0700 08/27/16 0750  BP: (!) 164/67   Pulse: 91   Resp: 17   Temp:  98.7 F (37.1 C)    Intake/Output Summary (Last 24 hours) at 08/27/16 0759 Last data filed at 08/27/16 0600  Gross per 24 hour  Intake          1902.98 ml  Output             1430 ml  Net           472.98 ml     Laboratory CBC    Component Value Date/Time   WBC 22.7 (H) 08/27/2016 0430   HGB 7.2 (L) 08/27/2016 0430   HCT 21.2 (L) 08/27/2016 0430   PLT 134 (L) 08/27/2016 0430    BMET    Component Value Date/Time   NA 138 08/27/2016 0430   K 3.5 08/27/2016 0430   CL 108 08/27/2016 0430   CO2 22 08/27/2016 0430   GLUCOSE 231 (H) 08/27/2016 0430   BUN 30 (H) 08/27/2016 0430   CREATININE 1.53 (H) 08/27/2016 0430   CALCIUM 8.5 (L) 08/27/2016 0430   GFRNONAA 43 (L) 08/27/2016 0430   GFRAA 50 (L) 08/27/2016 0430    COAG Lab Results  Component Value Date   INR 1.31 09-14-2016   INR 1.19 Sep 14, 2016   INR 2.07 06/19/2016   No results found for: PTT  Antibiotics Anti-infectives    Start     Dose/Rate Route Frequency Ordered Stop   08/26/16 1000  levofloxacin (LEVAQUIN) tablet 750 mg  Status:  Discontinued     750 mg Oral Daily 09-14-2016 1624 08/26/16 0809   09/14/2016 1630  cefUROXime (ZINACEF) 1.5 g in dextrose 5 % 50 mL IVPB     1.5 g 100 mL/hr over 30 Minutes Intravenous Every 12 hours 2016/09/14 1624 08/26/16 0430   2016/09/14 0705  dextrose 5 % with cefUROXime (ZINACEF) ADS Med    Comments:  Scronce, Trina   : cabinet override      2016-09-14 0705 09/14/2016 0842   09/14/2016 0604  cefUROXime (ZINACEF) 1.5 g in dextrose 5 % 50 mL IVPB     1.5 g 100 mL/hr over 30 Minutes Intravenous 30 min pre-op 09-14-16 0604 September 14, 2016  [id number]       V. Charlena CrossWells Dontae Minerva IV, M.D. [facility] Office: [phone] Pager:  [phone]

## 2016-08-27 NOTE — Progress Notes (Signed)
Patient was admitted around noon time from ICU with no acute distress noted. Sleepy but easily arousable, AAO x4. On Nitro drip, titrated as needed. 1 unit packed cells given with no untoward reaction/s noted.

## 2016-08-27 NOTE — Progress Notes (Signed)
ANTICOAGULATION CONSULT NOTE - Initial Consult  Pharmacy Consult for Lovenox Indication: atrial fibrillation  Allergies  Allergen Reactions  . Lopressor [Metoprolol Tartrate] Palpitations    LOPRESSOR bothers the patient.  . Niacin Swelling    FACIAL EDEMA    Patient Measurements: Height: 5\' 9"  (175.3 cm) (Simultaneous filing. User may not have seen previous data.) Weight: 132 lb (59.9 kg) (Simultaneous filing. User may not have seen previous data.) IBW/kg (Calculated) : 70.7   Vital Signs: Temp: 98.7 F (37.1 C) (10/13 0750) Temp Source: Oral (10/13 0750) BP: 164/67 (10/13 0700) Pulse Rate: 91 (10/13 0700)  Labs:  Recent Labs  2016/09/05 0705  09-05-2016 1700 08/26/16 0400 08/27/16 0430  HGB  --   < > 8.3* 8.0* 7.2*  HCT  --   < > 24.8* 23.8* 21.2*  PLT  --   --  150 156 134*  APTT 39*  --  39*  --   --   LABPROT 15.2  --  16.4*  --   --   INR 1.19  --  1.31  --   --   CREATININE  --   --  1.44* 1.50* 1.53*  < > = values in this interval not displayed.  Estimated Creatinine Clearance: 35.3 mL/min (by C-G formula based on SCr of 1.53 mg/dL (H)).   Medical History: Past Medical History:  Diagnosis Date  . AAA (abdominal aortic aneurysm) (HCC)   . Anemia   . Anxiety   . Arthritis    Gout  . Atrial fibrillation (HCC)   . Barrett esophagus   . Carotid artery occlusion    Carotid endarterectomy 2002 and 2004  . COPD (chronic obstructive pulmonary disease) (HCC)   . Diabetes mellitus   . Diverticulosis   . DVT (deep venous thrombosis) (HCC)   . Erectile dysfunction   . GERD (gastroesophageal reflux disease)   . Gout   . Hiatal hernia   . History of stress test    March 2012 post stress ejection fraction 56%, negative for ischemia.  Last 2-D echocardiogram March 2012 younger than 55% mild concentric left ventricular hypertrophy grade 1 diastolic dysfunction. Mildly dilated left atrium. 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  . Hyperlipidemia   . Hypertension   .  Kidney stone   . Lumbar stenosis   . PAD (peripheral artery disease) (HCC)    Fem-Fem by-pass graft 1991. redo patch angioplasty in 2000 and 2004.  LEA dopplers: 04/2012- R-ABI 1.34, L-ABI 0.97, patent Left lower extremity graft.  . Pancreatitis   . Stroke [facility]) March 2016   Lihgt Stroke    Medications:  Prescriptions Prior to Admission  Medication Sig Dispense Refill Last Dose  . ALPRAZolam (XANAX) 0.5 MG tablet Take 0.25-0.5 mg by mouth at bedtime as needed for anxiety.    08/24/2016 at Unknown time  . amLODipine (NORVASC) 5 MG tablet Take 5 mg by mouth daily with supper.    2016-09-05 at 0515  . aspirin EC 81 MG tablet Take 81 mg by mouth every evening.   08/24/2016 at Unknown time  . atorvastatin (LIPITOR) 80 MG tablet Take 40 mg by mouth every evening.    08/24/2016 at Unknown time  . cilostazol (PLETAL) 100 MG tablet Take 100 mg by mouth 2 (two) times daily at 8 am and 10 pm.    Past Week at Unknown time  . cloNIDine (CATAPRES) 0.3 MG tablet Take 0.1 mg by mouth 3 (three) times daily.   6 September 05, 2016 at 0515  .  docusate sodium (COLACE) 100 MG capsule Take 200 mg by mouth 2 (two) times daily as needed for mild constipation.    Past Week at Unknown time  . enoxaparin (LOVENOX) 100 MG/ML injection Inject 100 mg into the skin daily at 6 PM.  0 08/24/2016 at Unknown time  . Ferrous Fumarate-Folic Acid (FERROCITE F PO) Take 1 capsule by mouth daily before lunch.    Past Week at Unknown time  . glipiZIDE (GLUCOTROL) 5 MG tablet Take 5 mg by mouth 2 (two) times daily before a meal.   08/24/2016 at Unknown time  . levofloxacin (LEVAQUIN) 750 MG tablet Take 1 tablet (750 mg total) by mouth daily. 3 tablet 0 09/13/2016 at 0515  . losartan (COZAAR) 100 MG tablet Take 100 mg by mouth daily with lunch.    08/24/2016 at Unknown time  . metoprolol tartrate (LOPRESSOR) 25 MG tablet TAKE 1 TABLET BY MOUTH TWICE DAILY 180 tablet 0 08/30/2016 at 0515  . mometasone-formoterol (DULERA) 200-5 MCG/ACT AERO  Inhale 2 puffs into the lungs 2 (two) times daily. 1 Inhaler 5 08/24/2016 at Unknown time  . omega-3 acid ethyl esters (LOVAZA) 1 G capsule Take 2 g by mouth 2 (two) times daily.    Past Week at Unknown time  . oxyCODONE 20 MG TABS Take 1 tablet (20 mg total) by mouth every 8 (eight) hours as needed for moderate pain. 10 tablet 0 08/16/2016 at 0515  . pantoprazole (PROTONIX) 40 MG tablet Take 40 mg by mouth 2 (two) times daily.    08/21/2016 at 0515  . propafenone (RYTHMOL) 225 MG tablet Take 225 mg by mouth 2 (two) times daily.   09/12/2016 at 0515  . Respiratory Therapy Supplies (FLUTTER) DEVI Use as directed. 1 each 0 08/24/2016 at Unknown time  . saxagliptin HCl (ONGLYZA) 2.5 MG TABS tablet Take 2.5 mg by mouth daily.   08/24/2016 at Unknown time  . tamsulosin (FLOMAX) 0.4 MG CAPS capsule Take 0.4 mg by mouth daily as needed (for urinary difficulty).    Past Week at Unknown time  . testosterone cypionate (DEPOTESTOTERONE CYPIONATE) 200 MG/ML injection Inject 100 mg into the skin every 14 (fourteen) days.    Past Month at Unknown time  . Vitamin D, Ergocalciferol, (DRISDOL) 50000 UNITS CAPS capsule Take 50,000 Units by mouth every 7 (seven) days. Wednesday   Past Week at Unknown time  . Warfarin Sodium (COUMADIN PO) Take 4-6 mg by mouth as directed. 4 mg daily with the exception of 6 mg on Fridays   Past Week at Unknown time    Assessment:  --10/12: THORACIC AORTIC ENDOVASCULAR STENT GRAFT WITH RETROPERITONEAL EXPOSURE   75 y/o M on Coumadin PTA for afib and h/o CVA to resume full dose LMWH today (08/27/16 on POD2).  Acute blood loss anemia noted by MD.  Labs: Scr 1.53 (up), Hgb 8>>7.2, HCT 21.1, Plts 134, WBC 22.7 (up, not on abx, CXR clear)  Goal of Therapy:  Anti-Xa level 0.6-1 units/ml 4hrs after LMWH dose given Monitor platelets by anticoagulation protocol: Yes   Plan:  Lovenox 60mg  SQ BID CBC Q72h while on LMWH Awaiting return of bowel function.   Lyn Joens S. Merilynn Finlandobertson, PharmD,  BCPS Clinical Staff Pharmacist Pager [phone]  Misty Stanleyobertson, Kadie Balestrieri Stillinger 08/27/2016,8:12 AM

## 2016-08-27 NOTE — Progress Notes (Addendum)
Patient ID: [name], male   DOB: [birth year], 75 y.o.   MRN: [id number] TCTS DAILY ICU PROGRESS NOTE                   [address]            [place] [address]          [phone]   2 Days Post-Op Procedure(s) (LRB): THORACIC AORTIC ENDOVASCULAR STENT GRAFT WITH RETROPERITONEAL EXPOSURE (N/A)  Total Length of Stay:  LOS: 2 days   Subjective: Pain better, still on iv nitro   Objective: Vital signs in last 24 hours: Temp:  [98.2 F (36.8 C)-98.9 F (37.2 C)] 98.7 F (37.1 C) (10/13 0750) Pulse Rate:  [67-97] 86 (10/13 1030) Cardiac Rhythm: Normal sinus rhythm (10/13 0800) Resp:  [14-25] 18 (10/13 1030) BP: (141-196)/(43-75) 144/68 (10/13 1030) SpO2:  [93 %-98 %] 96 % (10/13 1030)  Filed Weights   09/14/2016 0723  Weight: 132 lb (59.9 kg)    Weight change:    Hemodynamic parameters for last 24 hours:    Intake/Output from previous day: 10/12 0701 - 10/13 0700 In: 1978.5 [P.O.:240; I.V.:1688.5; IV Piggyback:50] Out: 1430 [Urine:1430]  Intake/Output this shift: Total I/O In: 181 [P.O.:30; I.V.:151] Out: -   Current Meds: Scheduled Meds: . amLODipine  5 mg Oral Q supper  . aspirin EC  81 mg Oral QPM  . atorvastatin  40 mg Oral QPM  . chlorhexidine  15 mL Mouth Rinse BID  . cloNIDine  0.2 mg Oral TID  . docusate sodium  100 mg Oral Daily  . enoxaparin (LOVENOX) injection  60 mg Subcutaneous Q12H  . insulin aspart  0-9 Units Subcutaneous TID WC  . linagliptin  5 mg Oral Daily  . losartan  100 mg Oral Q lunch  . mouth rinse  15 mL Mouth Rinse q12n4p  . metoprolol tartrate  25 mg Oral BID  . mometasone-formoterol  2 puff Inhalation BID  . omega-3 acid ethyl esters  2 g Oral BID  . pantoprazole  40 mg Oral BID  . propafenone  225 mg Oral BID  . Vitamin D (Ergocalciferol)  50,000 Units Oral Q7 days   Continuous Infusions: . sodium chloride    . sodium chloride 50 mL/hr at 08/27/16 0400  . nitroGLYCERIN 85 mcg/min (08/27/16 0400)    PRN Meds:.sodium chloride, acetaminophen **OR** acetaminophen, alum & mag hydroxide-simeth, aluminum hydroxide-magnesium carbonate, docusate sodium, guaiFENesin-dextromethorphan, hydrALAZINE, morphine injection, ondansetron, oxyCODONE, phenol, potassium chloride, tamsulosin  General appearance: alert and cooperative Neurologic: intact Heart: regular rate and rhythm, S1, S2 normal, no murmur, click, rub or gallop Lungs: diminished breath sounds bibasilar Abdomen: soft, non-tender; bowel sounds normal; no masses,  no organomegaly Extremities: extremities normal, atraumatic, no cyanosis or edema and Homans sign is negative, no sign of DVT Wound: intact   Lab Results: CBC: Recent Labs  08/26/16 0400 08/27/16 0430  WBC 17.8* 22.7*  HGB 8.0* 7.2*  HCT 23.8* 21.2*  PLT 156 134*   BMET:  Recent Labs  08/26/16 0400 08/27/16 0430  NA 139 138  K 3.9 3.5  CL 107 108  CO2 22 22  GLUCOSE 160* 231*  BUN 27* 30*  CREATININE 1.50* 1.53*  CALCIUM 8.8* 8.5*    CMET: Lab Results  Component Value Date   WBC 22.7 (H) 08/27/2016   HGB 7.2 (L) 08/27/2016   HCT 21.2 (L) 08/27/2016   PLT 134 (L) 08/27/2016   GLUCOSE 231 (H) 08/27/2016  ALT 18 08/17/2016   AST 28 08/17/2016   NA 138 08/27/2016   K 3.5 08/27/2016   CL 108 08/27/2016   CREATININE 1.53 (H) 08/27/2016   BUN 30 (H) 08/27/2016   CO2 22 08/27/2016   INR 1.31 09/08/2016   HGBA1C 6.5 (H) 08/17/2016    PT/INR:  Recent Labs  09/05/2016 1700  LABPROT 16.4*  INR 1.31   Radiology: Dg Chest Port 1 View  Result Date: 08/27/2016 CLINICAL DATA:  Shortness of breath, cough today. EXAM: PORTABLE CHEST 1 VIEW COMPARISON:  Chest x-rays dated 09/06/2016 and 08/23/2016. FINDINGS: Heart size and mediastinal contours are stable. Atherosclerotic changes noted at the aortic arch. Right IJ sheath remains in place with tip overlying the upper SVC. Intra-aortic stent graft appears stable in position. Study is slightly hypoinspiratory.  Given the low lung volumes, lungs appear stable. Probable mild scarring/fibrosis at each lung base, left greater than right, stable. No evidence of pneumonia. No pleural effusion or pneumothorax seen. Free air underlying the right hemidiaphragm is an expected finding after review of patient's Op note. Osseous and soft tissue structures about the chest are otherwise unremarkable. IMPRESSION: No active disease. Slightly low lung volumes. No evidence of pneumonia. Aortic atherosclerosis. Electronically Signed   By: Bary RichardStan  Maynard M.D.   On: 08/27/2016 08:20   Dg Abd Portable 1v  Result Date: 08/27/2016 CLINICAL DATA:  Ileus. EXAM: PORTABLE ABDOMEN - 1 VIEW COMPARISON:  Chest radiograph 08/27/2016.  Abdominal CT 03/26/2016 FINDINGS: There is a thoracoabdominal stent graft which is partially visualized. Gas-filled loops of both small and large bowel. There is gas in the rectum. Small bowel loops are moderately distended measuring up to 4 cm. Multilevel degenerative changes in lumbar spine. Surgical clips in the left groin. Vascular calcifications in both groins. Limited evaluation for free air on this portable view. IMPRESSION: Distended loops of the small bowel along with gas-filled colon. Findings are suggestive for an ileus pattern. Electronically Signed   By: Richarda OverlieAdam  Henn M.D.   On: 08/27/2016 09:52     Assessment/Plan: S/P Procedure(s) (LRB): THORACIC AORTIC ENDOVASCULAR STENT GRAFT WITH RETROPERITONEAL EXPOSURE (N/A) Mobilize Leucocytosis persists  No acute abdominal findings - has history of gall stones but not tender over GB Discussed with Dr Myra GianottiBrabham , will transfuse one unit PRBC and monitor Sips and chips-   Delight Ovensdward B Zanyah Lentsch 08/27/2016 10:32 AM

## 2016-08-28 LAB — BASIC METABOLIC PANEL
ANION GAP: 7 (ref 5–15)
BUN: 29 mg/dL — ABNORMAL HIGH (ref 6–20)
CALCIUM: 8.2 mg/dL — AB (ref 8.9–10.3)
CO2: 22 mmol/L (ref 22–32)
Chloride: 109 mmol/L (ref 101–111)
Creatinine, Ser: 1.4 mg/dL — ABNORMAL HIGH (ref 0.61–1.24)
GFR, EST AFRICAN AMERICAN: 55 mL/min — AB (ref >60)
GFR, EST NON AFRICAN AMERICAN: 48 mL/min — AB (ref >60)
GLUCOSE: 260 mg/dL — AB (ref 65–99)
Potassium: 3.4 mmol/L — ABNORMAL LOW (ref 3.5–5.1)
SODIUM: 138 mmol/L (ref 135–145)

## 2016-08-28 LAB — CBC
HCT: 23.2 % — ABNORMAL LOW (ref 39.0–52.0)
Hemoglobin: 7.8 g/dL — ABNORMAL LOW (ref 13.0–17.0)
MCH: 30.4 pg (ref 26.0–34.0)
MCHC: 33.6 g/dL (ref 30.0–36.0)
MCV: 90.3 fL (ref 78.0–100.0)
PLATELETS: 132 10*3/uL — AB (ref 150–400)
RBC: 2.57 MIL/uL — ABNORMAL LOW (ref 4.22–5.81)
RDW: 15.8 % — AB (ref 11.5–15.5)
WBC: 19.6 10*3/uL — AB (ref 4.0–10.5)

## 2016-08-28 LAB — GLUCOSE, CAPILLARY
Glucose-Capillary: 206 mg/dL — ABNORMAL HIGH (ref 65–99)
Glucose-Capillary: 200 mg/dL — ABNORMAL HIGH (ref 65–99)
Glucose-Capillary: 121 mg/dL — ABNORMAL HIGH (ref 65–99)

## 2016-08-28 LAB — HEMOGLOBIN A1C
Hgb A1c MFr Bld: 6.5 % — ABNORMAL HIGH (ref 4.8–5.6)
Mean Plasma Glucose: 140 mg/dL

## 2016-08-28 MED ORDER — AMLODIPINE BESYLATE 10 MG PO TABS
10.0000 mg | ORAL_TABLET | Freq: Every day | ORAL | Status: DC
  Administered 2016-08-28 – 2016-09-01 (×5): 10 mg via ORAL
  Filled 2016-08-28 (×5): qty 1

## 2016-08-28 MED ORDER — POTASSIUM CHLORIDE CRYS ER 20 MEQ PO TBCR
40.0000 meq | EXTENDED_RELEASE_TABLET | Freq: Once | ORAL | Status: AC
  Administered 2016-08-28: 40 meq via ORAL
  Filled 2016-08-28: qty 2

## 2016-08-28 MED ORDER — WARFARIN SODIUM 4 MG PO TABS
4.0000 mg | ORAL_TABLET | Freq: Once | ORAL | Status: AC
  Administered 2016-08-28: 4 mg via ORAL
  Filled 2016-08-28: qty 1

## 2016-08-28 MED ORDER — WARFARIN - PHARMACIST DOSING INPATIENT
Freq: Every day | Status: DC
  Administered 2016-08-28: 18:00:00

## 2016-08-28 NOTE — Progress Notes (Signed)
ANTICOAGULATION CONSULT NOTE - Follow Up Consult  Pharmacy Consult for Lovenox/Coumadin Indication: atrial fibrillation  Allergies  Allergen Reactions  . Lopressor [Metoprolol Tartrate] Palpitations    LOPRESSOR bothers the patient.  . Niacin Swelling    FACIAL EDEMA    Patient Measurements: Height: 5\' 9"  (175.3 cm) (Simultaneous filing. User may not have seen previous data.) Weight: 132 lb (59.9 kg) (Simultaneous filing. User may not have seen previous data.) IBW/kg (Calculated) : 70.7 Heparin Dosing Weight: 59.9kg  Vital Signs: Temp: 99.2 F (37.3 C) (10/14 1100) Temp Source: Oral (10/14 1100) BP: 158/48 (10/14 1100) Pulse Rate: 84 (10/14 1100)  Labs:  Recent Labs  08/27/2016 1700 08/26/16 0400 08/27/16 0430 08/28/16 0500  HGB 8.3* 8.0* 7.2* 7.8*  HCT 24.8* 23.8* 21.2* 23.2*  PLT 150 156 134* 132*  APTT 39*  --   --   --   LABPROT 16.4*  --   --   --   INR 1.31  --   --   --   CREATININE 1.44* 1.50* 1.53* 1.40*    Estimated Creatinine Clearance: 38.6 mL/min (by C-G formula based on SCr of 1.4 mg/dL (H)).   Assessment: [name] on Coumadin PTA for AFib, now s/p thoracic aortic endovascular stent graft on 10/12. Pt currently on full-dose enoxaparin, now to restart warfarin with improvement in bowel function per vascular surgery. Hgb 7.8 now s/p 1 unit pRBCs on 10/13, plt 132, no S/Sx bleeding noted.  Goal of Therapy:  INR 2-3 Anti-Xa level 0.6-1 units/ml 4hrs after LMWH dose given Monitor platelets by anticoagulation protocol: Yes   Plan:  -Continue Lovenox 60mg  [place] BID  -Warfarin 4mg  PO x1 -Daily INR, CBC, S/Sx bleeding  Fredonia HighlandMichael Francene Mcerlean, PharmD PGY-1 Pharmacy Resident Pager: [phone] 08/28/2016

## 2016-08-28 NOTE — Progress Notes (Signed)
  Progress Note    08/28/2016 11:17 AM 3 Days Post-Op  Subjective:  Feeling ok and less bloated, tolerating clear liquids  Vitals:   08/28/16 0630 08/28/16 0645  BP: (!) 148/61 (!) 148/56  Pulse: 80 71  Resp: 14 17  Temp:  98.9 F (37.2 C)    Physical Exam: Abdomen is soft with healing incisions lle with palpable aneurysmal bypass Feet are warm  CBC    Component Value Date/Time   WBC 19.6 (H) 08/28/2016 0500   RBC 2.57 (L) 08/28/2016 0500   HGB 7.8 (L) 08/28/2016 0500   HCT 23.2 (L) 08/28/2016 0500   PLT 132 (L) 08/28/2016 0500   MCV 90.3 08/28/2016 0500   MCH 30.4 08/28/2016 0500   MCHC 33.6 08/28/2016 0500   RDW 15.8 (H) 08/28/2016 0500   LYMPHSABS 2.1 06/12/2016 1055   MONOABS 0.9 06/12/2016 1055   EOSABS 0.3 06/12/2016 1055   BASOSABS 0.0 06/12/2016 1055    BMET    Component Value Date/Time   NA 138 08/28/2016 0500   K 3.4 (L) 08/28/2016 0500   CL 109 08/28/2016 0500   CO2 22 08/28/2016 0500   GLUCOSE 260 (H) 08/28/2016 0500   BUN 29 (H) 08/28/2016 0500   CREATININE 1.40 (H) 08/28/2016 0500   CALCIUM 8.2 (L) 08/28/2016 0500   GFRNONAA 48 (L) 08/28/2016 0500   GFRAA 55 (L) 08/28/2016 0500    INR    Component Value Date/Time   INR 1.31 08/31/2016 1700     Intake/Output Summary (Last 24 hours) at 08/28/16 1117 Last data filed at 08/28/16 0900  Gross per 24 hour  Intake          1810.98 ml  Output             1580 ml  Net           230.98 ml     Assessment:  75 y.o. male is s/p complex thoracic aneurysm repair  Plan: -soft diet today -restart coumadin with stable h/h, on tx lovenox -oob as tolerates -increased norvasc, no nitro gtt and will wean if possible -remain in 3S with continued hypertension   Brandon C. Randie Heinzain, MD [facility] [place] Office: [phone] Pager: [phone]  08/28/2016 11:17 AM

## 2016-08-28 NOTE — Op Note (Signed)
NAMWetzel Bjornstad:  Triggs, Finas                 ACCOUNT NO.:  [id number]  MEDICAL RECORD NO.:  [id number]  LOCATION:  XRAY                         FACILITY:  [facility]  PHYSICIAN:  Sheliah PlaneEdward Vraj Denardo, MD    DATE OF BIRTH:  August 04, 1941  DATE OF PROCEDURE:  2016-03-19 DATE OF DISCHARGE:  08/17/2016                              OPERATIVE REPORT   PREOPERATIVE DIAGNOSIS:  Thoracic aortic aneurysm.  POSTOPERATIVE DIAGNOSIS:  Thoracic aortic aneurysm.  CO-SURGEONS:  Dr. Tyrone SageGerhardt and Dr. Myra GianottiBrabham.  PROCEDURE PERFORMED: 1. Retroperitoneal exposure of right common iliac, external and     internal. 2. Catheterization of the aorta x2. 3. Placement of thoracic aortic stent graft GORE CTAG 34 x 20. 4. Thoracic aortogram x2.  ANESTHESIA:  General.  BRIEF HISTORY:  The patient is a 75 year old male with known severe vascular disease including previous arm vein and tibial bypass on the left with aneurysmal dilatation.  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  The patient has a known underlying pulmonary dysfunction because of the increasing size of the saccular aneurysm and the fact that it was felt to be stentable preventing more invasive procedure.  This was recommended to the patient to prevent impending possible rupture.  Risks and options were discussed with the patient and his family in detail.  DESCRIPTION OF THE PROCEDURE:  The patient was taken to the hybrid OR. He underwent general endotracheal anesthesia.  An appropriate time-out was performed.  The chest, abdomen, groins to the knees were prepped with Betadine and draped in the usual sterile manner.  Appropriate time- out was performed and administration of preoperative antibiotics was confirmed.  We then proceeded with a right lower lobe incision just below the umbilicus to gain access to the distal aorta and proximal common iliac vessels.  Preoperative studies revealed that  the patient's femoral vessels would be too small for appropriate stenting.  The rectus was mobilized laterally.  We did enter the peritoneal cavity trying to stay retroperitoneal.  This is closed with interrupted 3-0 Vicryl.  As we moved to the retroperitoneum, the external iliac artery was identified and dissected proximally to the common iliac.  Dissection was carried down to expose the aortic bifurcation and the right common iliac.  The external iliac and hypogastric branches were each encircled with vessel loops.  Care was taken to dissect the right iliac vein off the artery.  Because of the degree of calcification of the vessel, sewing a graft on to the iliac did not seem feasible.  We then proceeded with direct puncture of the right common iliac artery after full heparinization.  A counter incision was made in the right lower quadrant to allow tunneling into the retroperitoneal space.  An 6318- gauge needle was used to cannulate the right common iliac artery and 0.35 Bentson wire was advanced into the aorta under fluoroscopic guidance.  The wire was then brought out through these abdominal wall stab wound.  An 8-French 21 mm sheath was then placed into the iliac artery.  The wire exchanged to a Bentson wire and the Lunderquist curve was exchanged.  The 8-French sheath was  then removed.  We proceeded with placement of a 22-French sheath directly into the right common iliac vessel.  Through this, a 2nd catheter was placed into the descending aorta, and aortogram was performed confirming the location of the aneurysm and the celiac artery.  A GORE CTAG device 34 x 20 had been selected and was prepared.  This was advanced over the guidewire under fluoroscopic guidance and deployed.  Completion of aortogram showed good position without evidence of endovascular leak.  We then proceeded with removal of the wires and catheters.  This was done with first placing a Q50 balloon into the  aortic bifurcation.  The balloon was blown up to proximal arterial control, vessel  loop was on the internal and external iliac artery.  The hypogastric was occluded with a Henley clamp.  We then removed the 22-French sheath and the right common iliac artery was closed with interrupted 5-0 pledgetted sutures.  Balloon was let down.  The wire and balloon were removed and the remaining sutures ligated.  A 50 mg of protamine was administered.  FloSeal was placed on the wound with operative field hemostatic.  The abdominal wall fascia was closed with PDS, subcutaneous tissue closed with 2-0 Vicryl, 4-0 subcuticular in skin edge.  Sponge and needle counts were reported as correct at the completion of the procedure.  Estimated blood loss approximately 250 mL.  The patient tolerated the procedure without obvious complication and was transferred to the recovery room for further postoperative care.     Sheliah Plane, MD     EG/MEDQ  D:  08/27/2016  T:  08/28/2016  Job:  [id number]

## 2016-08-28 NOTE — Progress Notes (Addendum)
[address]       [place] [id number]             [phone]      3 Days Post-Op Procedure(s) (LRB): THORACIC AORTIC ENDOVASCULAR STENT GRAFT WITH RETROPERITONEAL EXPOSURE (N/A) Subjective: Passing some flatus  Objective: Vital signs in last 24 hours: Temp:  [98.2 F (36.8 C)-98.8 F (37.1 C)] 98.3 F (36.8 C) (10/14 0315) Pulse Rate:  [68-101] 71 (10/14 0645) Cardiac Rhythm: Normal sinus rhythm (10/14 0315) Resp:  [12-22] 17 (10/14 0645) BP: (113-179)/(43-73) 148/56 (10/14 0645) SpO2:  [86 %-98 %] 95 % (10/14 0645)  Hemodynamic parameters for last 24 hours:    Intake/Output from previous day: 10/13 0701 - 10/14 0700 In: 1829.7 [P.O.:60; I.V.:1434.7; Blood:335] Out: 755 [Urine:755] Intake/Output this shift: Total I/O In: 210 [P.O.:210] Out: 825 [Urine:825]  General appearance: alert, cooperative, fatigued and no distress Heart: regular rate and rhythm Lungs: mildly dim in bases Abdomen: + BS, mod distension, mild LLQ ttp, - guarding/rebound Extremities: no edema- feet warm Wound: incis healing well Mouth: strawberry tongue   Lab Results:  Recent Labs  08/27/16 0430 08/28/16 0500  WBC 22.7* 19.6*  HGB 7.2* 7.8*  HCT 21.2* 23.2*  PLT 134* 132*   BMET:  Recent Labs  08/27/16 0430 08/28/16 0500  NA 138 138  K 3.5 3.4*  CL 108 109  CO2 22 22  GLUCOSE 231* 260*  BUN 30* 29*  CREATININE 1.53* 1.40*  CALCIUM 8.5* 8.2*    PT/INR:  Recent Labs  08/01/2016 1700  LABPROT 16.4*  INR 1.31   ABG    Component Value Date/Time   PHART 7.406 Oct 22, 2016 1714   HCO3 23.7 Oct 22, 2016 1714   TCO2 25 Oct 22, 2016 1714   ACIDBASEDEF 1.0 Oct 22, 2016 1714   O2SAT 94.0 Oct 22, 2016 1714   CBG (last 3)   Recent Labs  08/27/16 1630 08/27/16 2156 08/28/16 0800  GLUCAP 149* 149* 206*    Meds Scheduled Meds: . amLODipine  5 mg Oral Q supper  . aspirin EC  81 mg Oral QPM  . atorvastatin  40 mg Oral QPM  . chlorhexidine  15 mL Mouth  Rinse BID  . cloNIDine  0.2 mg Oral TID  . docusate sodium  100 mg Oral Daily  . enoxaparin (LOVENOX) injection  60 mg Subcutaneous Q12H  . insulin aspart  0-9 Units Subcutaneous TID WC  . linagliptin  5 mg Oral Daily  . losartan  100 mg Oral Q lunch  . mouth rinse  15 mL Mouth Rinse q12n4p  . metoprolol tartrate  25 mg Oral BID  . mometasone-formoterol  2 puff Inhalation BID  . omega-3 acid ethyl esters  2 g Oral BID  . pantoprazole  40 mg Oral BID  . propafenone  225 mg Oral BID  . Vitamin D (Ergocalciferol)  50,000 Units Oral Q7 days   Continuous Infusions: . sodium chloride    . sodium chloride 50 mL/hr at 08/28/16 0600  . nitroGLYCERIN 80 mcg/min (08/28/16 0600)   PRN Meds:.sodium chloride, acetaminophen **OR** acetaminophen, alum & mag hydroxide-simeth, aluminum hydroxide-magnesium carbonate, docusate sodium, guaiFENesin-dextromethorphan, hydrALAZINE, morphine injection, ondansetron, oxyCODONE, phenol, potassium chloride, tamsulosin  Xrays Dg Chest Port 1 View  Result Date: 08/27/2016 CLINICAL DATA:  Shortness of breath, cough today. EXAM: PORTABLE CHEST 1 VIEW COMPARISON:  Chest x-rays dated Oct 22, 2016 and 08/23/2016. FINDINGS: Heart size and mediastinal contours are stable. Atherosclerotic changes noted at the aortic arch. Right IJ sheath remains in  place with tip overlying the upper SVC. Intra-aortic stent graft appears stable in position. Study is slightly hypoinspiratory. Given the low lung volumes, lungs appear stable. Probable mild scarring/fibrosis at each lung base, left greater than right, stable. No evidence of pneumonia. No pleural effusion or pneumothorax seen. Free air underlying the right hemidiaphragm is an expected finding after review of patient's Op note. Osseous and soft tissue structures about the chest are otherwise unremarkable. IMPRESSION: No active disease. Slightly low lung volumes. No evidence of pneumonia. Aortic atherosclerosis. Electronically Signed   By:  Bary Richard M.D.   On: 08/27/2016 08:20   Dg Abd Portable 1v  Result Date: 08/27/2016 CLINICAL DATA:  Ileus. EXAM: PORTABLE ABDOMEN - 1 VIEW COMPARISON:  Chest radiograph 08/27/2016.  Abdominal CT 03/26/2016 FINDINGS: There is a thoracoabdominal stent graft which is partially visualized. Gas-filled loops of both small and large bowel. There is gas in the rectum. Small bowel loops are moderately distended measuring up to 4 cm. Multilevel degenerative changes in lumbar spine. Surgical clips in the left groin. Vascular calcifications in both groins. Limited evaluation for free air on this portable view. IMPRESSION: Distended loops of the small bowel along with gas-filled colon. Findings are suggestive for an ileus pattern. Electronically Signed   By: Richarda Overlie M.D.   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

## 2016-08-29 ENCOUNTER — Inpatient Hospital Stay (HOSPITAL_COMMUNITY): Payer: Medicare Other

## 2016-08-29 LAB — BASIC METABOLIC PANEL
ANION GAP: 7 (ref 5–15)
BUN: 29 mg/dL — AB (ref 6–20)
CO2: 21 mmol/L — AB (ref 22–32)
Calcium: 8.2 mg/dL — ABNORMAL LOW (ref 8.9–10.3)
Chloride: 111 mmol/L (ref 101–111)
Creatinine, Ser: 1.26 mg/dL — ABNORMAL HIGH (ref 0.61–1.24)
GFR calc Af Amer: >60 mL/min (ref >60)
GFR calc non Af Amer: 54 mL/min — ABNORMAL LOW (ref >60)
GLUCOSE: 227 mg/dL — AB (ref 65–99)
Potassium: 3.4 mmol/L — ABNORMAL LOW (ref 3.5–5.1)
Sodium: 139 mmol/L (ref 135–145)

## 2016-08-29 LAB — GLUCOSE, CAPILLARY
GLUCOSE-CAPILLARY: 121 mg/dL — AB (ref 65–99)
Glucose-Capillary: 204 mg/dL — ABNORMAL HIGH (ref 65–99)
Glucose-Capillary: 161 mg/dL — ABNORMAL HIGH (ref 65–99)
Glucose-Capillary: 148 mg/dL — ABNORMAL HIGH (ref 65–99)

## 2016-08-29 LAB — CBC
HEMATOCRIT: 23.9 % — AB (ref 39.0–52.0)
Hemoglobin: 7.9 g/dL — ABNORMAL LOW (ref 13.0–17.0)
MCH: 30 pg (ref 26.0–34.0)
MCHC: 33.1 g/dL (ref 30.0–36.0)
MCV: 90.9 fL (ref 78.0–100.0)
PLATELETS: 163 10*3/uL (ref 150–400)
RBC: 2.63 MIL/uL — ABNORMAL LOW (ref 4.22–5.81)
RDW: 15.3 % (ref 11.5–15.5)
WBC: 17.1 10*3/uL — AB (ref 4.0–10.5)

## 2016-08-29 LAB — PROTIME-INR
INR: 1.26
PROTHROMBIN TIME: 15.9 s — AB (ref 11.4–15.2)

## 2016-08-29 MED ORDER — METOCLOPRAMIDE HCL 5 MG/ML IJ SOLN
10.0000 mg | Freq: Four times a day (QID) | INTRAMUSCULAR | Status: DC
  Administered 2016-08-29 – 2016-09-09 (×40): 10 mg via INTRAVENOUS
  Filled 2016-08-29 (×37): qty 2

## 2016-08-29 MED ORDER — POTASSIUM CHLORIDE IN NACL 20-0.9 MEQ/L-% IV SOLN
INTRAVENOUS | Status: AC
  Administered 2016-08-29 – 2016-09-02 (×7): via INTRAVENOUS
  Filled 2016-08-29 (×9): qty 1000

## 2016-08-29 MED ORDER — SODIUM CHLORIDE 0.9 % IV SOLN: INTRAVENOUS | Status: DC

## 2016-08-29 MED ORDER — BISACODYL 10 MG RE SUPP
10.0000 mg | Freq: Every day | RECTAL | Status: DC | PRN
  Administered 2016-09-03 – 2016-09-07 (×2): 10 mg via RECTAL
  Filled 2016-08-29 (×2): qty 1

## 2016-08-29 MED ORDER — POLYETHYLENE GLYCOL 3350 17 G PO PACK
17.0000 g | PACK | Freq: Every day | ORAL | Status: DC | PRN
  Administered 2016-08-29: 17 g via ORAL
  Filled 2016-08-29: qty 1

## 2016-08-29 MED ORDER — WARFARIN SODIUM 5 MG PO TABS: 7.5000 mg | ORAL_TABLET | Freq: Once | ORAL | Status: DC

## 2016-08-29 NOTE — Progress Notes (Addendum)
  Progress Note    08/29/2016 9:58 AM 4 Days Post-Op  Subjective:  Feeling bloated this a.m  Vitals:   08/29/16 0800 08/29/16 0900  BP: (!) 151/67 (!) 184/64  Pulse: 71 79  Resp: 18 (!) 22  Temp:      Physical Exam: RRR  Abdomen more distended this a.m. Incisions cdi Feet are warm, palpable femoral pulses bilaterally  CBC    Component Value Date/Time   WBC 19.6 (H) 08/28/2016 0500   RBC 2.57 (L) 08/28/2016 0500   HGB 7.8 (L) 08/28/2016 0500   HCT 23.2 (L) 08/28/2016 0500   PLT 132 (L) 08/28/2016 0500   MCV 90.3 08/28/2016 0500   MCH 30.4 08/28/2016 0500   MCHC 33.6 08/28/2016 0500   RDW 15.8 (H) 08/28/2016 0500   LYMPHSABS 2.1 06/12/2016 1055   MONOABS 0.9 06/12/2016 1055   EOSABS 0.3 06/12/2016 1055   BASOSABS 0.0 06/12/2016 1055    BMET    Component Value Date/Time   NA 138 08/28/2016 0500   K 3.4 (L) 08/28/2016 0500   CL 109 08/28/2016 0500   CO2 22 08/28/2016 0500   GLUCOSE 260 (H) 08/28/2016 0500   BUN 29 (H) 08/28/2016 0500   CREATININE 1.40 (H) 08/28/2016 0500   CALCIUM 8.2 (L) 08/28/2016 0500   GFRNONAA 48 (L) 08/28/2016 0500   GFRAA 55 (L) 08/28/2016 0500    INR    Component Value Date/Time   INR 1.26 08/29/2016 0330     Intake/Output Summary (Last 24 hours) at 08/29/16 0958 Last data filed at 08/29/16 0500  Gross per 24 hour  Intake             2188 ml  Output              825 ml  Net             1363 ml     Assessment:  75 y.o. male is s/p complex thoracic aneurysm repair  Plan: -send cbc/bmp -lovenox bridge to coumadin -oob as tolerates -check kub for distension -remain 3S while on nitro gtt  Joss Friedel C. Randie Heinzain, MD [facility] [place] Office: [phone] Pager: [phone]  08/29/2016 9:58 AM  kub demonstrates significant ileus. Coumadin now held, remains on tx lovenox.Cr and wbc improved slightly today and hct is stable. Will hold on further investigation for ileus at this time.   Lemar LivingsBrandon  Latonyia Lopata

## 2016-08-29 NOTE — Progress Notes (Signed)
Attempted to place NG tube unsuccessfully as tube coiled in patient's mouth.  Tube removed and began to reinsert, but pt refused.  Pt educated on need for NGT and how this will benefit him, but pt still refusing.  Family at bedside and encouraging placement.  Will contact MD, update, and see if another route is available for NGT placement.

## 2016-08-29 NOTE — Progress Notes (Signed)
MD Randie Heinzain on call notified of pt's NGT refusal, and discussed the urgency/need for placement tonight vs morning.  Vomiting and aspiration are main concerns.  Attempted again to convince pt to comply with NGT placement tonight, but pt refused.  Will consult IR in the morning if unable to place per MD Randie Heinzain.

## 2016-08-29 NOTE — Progress Notes (Signed)
ANTICOAGULATION CONSULT NOTE - Follow Up Consult  Pharmacy Consult for Lovenox/Coumadin Indication: atrial fibrillation  Allergies  Allergen Reactions  . Lopressor [Metoprolol Tartrate] Palpitations    LOPRESSOR bothers the patient.  . Niacin Swelling    FACIAL EDEMA    Patient Measurements: Height: 5\' 9"  (175.3 cm) (Simultaneous filing. User may not have seen previous data.) Weight: 132 lb (59.9 kg) (Simultaneous filing. User may not have seen previous data.) IBW/kg (Calculated) : 70.7 Heparin Dosing Weight: 59.9kg  Vital Signs: Temp: 98.8 F (37.1 C) (10/15 0330) Temp Source: Oral (10/15 0330) BP: 166/66 (10/15 0330) Pulse Rate: 74 (10/15 0330)  Labs:  Recent Labs  08/27/16 0430 08/28/16 0500 08/29/16 0330  HGB 7.2* 7.8*  --   HCT 21.2* 23.2*  --   PLT 134* 132*  --   LABPROT  --   --  15.9*  INR  --   --  1.26  CREATININE 1.53* 1.40*  --     Estimated Creatinine Clearance: 38.6 mL/min (by C-G formula based on SCr of 1.4 mg/dL (H)).   Assessment: [name] on Coumadin PTA for AFib, now s/p thoracic aortic endovascular stent graft on 10/12. Pt currently on full-dose enoxaparin, now to restart warfarin with improvement in bowel function per vascular surgery. INR subtherapeutic at 1.26. Hgb 7.8 now s/p 1 unit pRBCs on 10/13, plt 132, no S/Sx bleeding noted.  Goal of Therapy:  INR 2-3 Anti-Xa level 0.6-1 units/ml 4hrs after LMWH dose given Monitor platelets by anticoagulation protocol: Yes   Plan:  -Continue Lovenox 60mg  [place] BID  -Warfarin 7.5mg  PO x1 -Daily INR, CBC, S/Sx bleeding  Fredonia HighlandMichael Chantee Cerino, PharmD PGY-1 Pharmacy Resident Pager: [phone] 08/29/2016

## 2016-08-29 NOTE — Progress Notes (Signed)
[address]       [place] [address]             [phone]      4 Days Post-Op Procedure(s) (LRB): THORACIC AORTIC ENDOVASCULAR STENT GRAFT WITH RETROPERITONEAL EXPOSURE (N/A) Subjective: Small amts of flatus, no BM, + LLQ discomfort  Objective: Vital signs in last 24 hours: Temp:  [97.7 F (36.5 C)-99.3 F (37.4 C)] 97.9 F (36.6 C) (10/15 1500) Pulse Rate:  [65-79] 68 (10/15 1500) Cardiac Rhythm: Normal sinus rhythm (10/15 1202) Resp:  [16-22] 17 (10/15 1500) BP: (124-184)/(52-67) 134/60 (10/15 1500) SpO2:  [90 %-98 %] 96 % (10/15 1500)  Hemodynamic parameters for last 24 hours:    Intake/Output from previous day: 10/14 0701 - 10/15 0700 In: 2398 [P.O.:770; I.V.:1628] Out: 1650 [Urine:1650] Intake/Output this shift: No intake/output data recorded.  General appearance: alert, cooperative and no distress Heart: regular rate and rhythm Lungs: clear to auscultation bilaterally Abdomen: mod distension, + ttp to LLQ without guardin or rebound Extremities: warm, absent pulses in feet, no edema Wound: incis healing well  Lab Results:  Recent Labs  08/28/16 0500 08/29/16 1015  WBC 19.6* 17.1*  HGB 7.8* 7.9*  HCT 23.2* 23.9*  PLT 132* 163   BMET:  Recent Labs  08/28/16 0500 08/29/16 1015  NA 138 139  K 3.4* 3.4*  CL 109 111  CO2 22 21*  GLUCOSE 260* 227*  BUN 29* 29*  CREATININE 1.40* 1.26*  CALCIUM 8.2* 8.2*    PT/INR:  Recent Labs  08/29/16 0330  LABPROT 15.9*  INR 1.26   ABG    Component Value Date/Time   PHART 7.406 2016-09-20 1714   HCO3 23.7 09/20/2016 1714   TCO2 25 20-Sep-2016 1714   ACIDBASEDEF 1.0 September 20, 2016 1714   O2SAT 94.0 20-Sep-2016 1714   CBG (last 3)   Recent Labs  08/28/16 2155 08/29/16 0813 08/29/16 1148  GLUCAP 121* 204* 161*    Meds Scheduled Meds: . amLODipine  10 mg Oral Q supper  . aspirin EC  81 mg Oral QPM  . atorvastatin  40 mg Oral QPM  . chlorhexidine  15 mL Mouth Rinse BID    . cloNIDine  0.2 mg Oral TID  . docusate sodium  100 mg Oral Daily  . enoxaparin (LOVENOX) injection  60 mg Subcutaneous Q12H  . insulin aspart  0-9 Units Subcutaneous TID WC  . linagliptin  5 mg Oral Daily  . losartan  100 mg Oral Q lunch  . mouth rinse  15 mL Mouth Rinse q12n4p  . metoprolol tartrate  25 mg Oral BID  . mometasone-formoterol  2 puff Inhalation BID  . omega-3 acid ethyl esters  2 g Oral BID  . pantoprazole  40 mg Oral BID  . propafenone  225 mg Oral BID  . Vitamin D (Ergocalciferol)  50,000 Units Oral Q7 days   Continuous Infusions: . sodium chloride    . sodium chloride 50 mL/hr (08/28/16 0700)  . nitroGLYCERIN 50 mcg/min (08/29/16 1507)   PRN Meds:.sodium chloride, acetaminophen **OR** acetaminophen, alum & mag hydroxide-simeth, bisacodyl, docusate sodium, guaiFENesin-dextromethorphan, hydrALAZINE, morphine injection, ondansetron, oxyCODONE, phenol, polyethylene glycol, tamsulosin  Xrays Dg Abd 2 Views  Result Date: 08/29/2016 CLINICAL DATA:  Abdominal distention.  No bowel movement for 4 days. EXAM: ABDOMEN - 2 VIEW COMPARISON:  Two days ago FINDINGS: Ongoing gaseous distension of predominately small bowel loops. Colonic loops are less distended. Some stool is seen along the left colon.  There is moderate pneumoperitoneum with Rigler's sign on the left and subdiaphragmatic gas on the right. Right-sided diaphragmatic gas is similar to chest x-ray 2 days ago. Retroperitoneal gas in the right iliac fossa is decreasing. Asymmetric lung markings at the left base, which have been seen on recent imaging, including 07/05/2016 chest CT. These results were called by telephone at the time of interpretation on 08/29/2016 at 3:42 pm to Dr. Lemar LivingsBRANDON CAIN , who verbally acknowledged these results. IMPRESSION: 1. Unchanged gaseous bowel distention with fluid levels, likely ileus. 2. Moderate pneumoperitoneum, also seen on chest x-ray 2 days ago. Surgery was retroperitoneal approach but  reportedly the peritoneum was opened. Electronically Signed   By: Marnee SpringJonathon  Watts M.D.   On: 08/29/2016 15:44    Assessment/Plan: S/P Procedure(s) (LRB): THORACIC AORTIC ENDOVASCULAR STENT GRAFT WITH RETROPERITONEAL EXPOSURE (N/A)  1 will make npo except meds with sips H2O for now . Abdomen is not acute, borderline acidosis . Creat improved. If picture changes will need CT scan 2 Tmax 99.3, leukocytosis is improving 3 BP remains elevated at times trying to wean nitro- cont current multiple agents 4 on tradjenta and SSI for CBG's- cont to monitor 5 coumadin currently on hold 6 add K+ to IVF  LOS: 4 days    GOLD,WAYNE E 08/29/2016

## 2016-08-30 ENCOUNTER — Inpatient Hospital Stay (HOSPITAL_COMMUNITY): Payer: Medicare Other

## 2016-08-30 LAB — GLUCOSE, CAPILLARY
GLUCOSE-CAPILLARY: 155 mg/dL — AB (ref 65–99)
GLUCOSE-CAPILLARY: 134 mg/dL — AB (ref 65–99)
Glucose-Capillary: 210 mg/dL — ABNORMAL HIGH (ref 65–99)
Glucose-Capillary: 158 mg/dL — ABNORMAL HIGH (ref 65–99)
Glucose-Capillary: 103 mg/dL — ABNORMAL HIGH (ref 65–99)

## 2016-08-30 LAB — CBC
HEMATOCRIT: 24.3 % — AB (ref 39.0–52.0)
Hemoglobin: 8.2 g/dL — ABNORMAL LOW (ref 13.0–17.0)
MCH: 30.9 pg (ref 26.0–34.0)
MCHC: 33.7 g/dL (ref 30.0–36.0)
MCV: 91.7 fL (ref 78.0–100.0)
Platelets: 173 10*3/uL (ref 150–400)
RBC: 2.65 MIL/uL — ABNORMAL LOW (ref 4.22–5.81)
RDW: 15.4 % (ref 11.5–15.5)
WBC: 11.6 10*3/uL — ABNORMAL HIGH (ref 4.0–10.5)

## 2016-08-30 LAB — BASIC METABOLIC PANEL
Anion gap: 8 (ref 5–15)
BUN: 32 mg/dL — AB (ref 6–20)
CO2: 19 mmol/L — AB (ref 22–32)
Calcium: 8.5 mg/dL — ABNORMAL LOW (ref 8.9–10.3)
Chloride: 112 mmol/L — ABNORMAL HIGH (ref 101–111)
Creatinine, Ser: 1.49 mg/dL — ABNORMAL HIGH (ref 0.61–1.24)
GFR calc Af Amer: 51 mL/min — ABNORMAL LOW (ref >60)
GFR, EST NON AFRICAN AMERICAN: 44 mL/min — AB (ref >60)
GLUCOSE: 172 mg/dL — AB (ref 65–99)
POTASSIUM: 3.7 mmol/L (ref 3.5–5.1)
Sodium: 139 mmol/L (ref 135–145)

## 2016-08-30 LAB — HEPATIC FUNCTION PANEL
ALT: 93 U/L — ABNORMAL HIGH (ref 17–63)
AST: 59 U/L — ABNORMAL HIGH (ref 15–41)
Albumin: 2.5 g/dL — ABNORMAL LOW (ref 3.5–5.0)
Alkaline Phosphatase: 47 U/L (ref 38–126)
Bilirubin, Direct: 0.3 mg/dL (ref 0.1–0.5)
Indirect Bilirubin: 0.9 mg/dL (ref 0.3–0.9)
Total Bilirubin: 1.2 mg/dL (ref 0.3–1.2)
Total Protein: 5.9 g/dL — ABNORMAL LOW (ref 6.5–8.1)

## 2016-08-30 LAB — PROTIME-INR
INR: 1.23
PROTHROMBIN TIME: 15.5 s — AB (ref 11.4–15.2)

## 2016-08-30 LAB — AMYLASE: Amylase: 33 U/L (ref 28–100)

## 2016-08-30 MED ORDER — LIDOCAINE VISCOUS 2 % MT SOLN
15.0000 mL | Freq: Once | OROMUCOSAL | Status: AC
  Administered 2016-08-30: 15 mL via OROMUCOSAL
  Filled 2016-08-30: qty 15

## 2016-08-30 MED ORDER — LIDOCAINE VISCOUS 2 % MT SOLN
OROMUCOSAL | Status: AC
  Administered 2016-08-30: 10:00:00
  Filled 2016-08-30: qty 15

## 2016-08-30 MED ORDER — INSULIN ASPART 100 UNIT/ML ~~LOC~~ SOLN
0.0000 [IU] | SUBCUTANEOUS | Status: DC
  Administered 2016-08-30: 2 [IU] via SUBCUTANEOUS
  Administered 2016-08-30: 1 [IU] via SUBCUTANEOUS
  Administered 2016-08-30 – 2016-08-31 (×2): 2 [IU] via SUBCUTANEOUS
  Administered 2016-08-31: 1 [IU] via SUBCUTANEOUS
  Administered 2016-08-31 (×2): 2 [IU] via SUBCUTANEOUS
  Administered 2016-08-31 – 2016-09-01 (×2): 1 [IU] via SUBCUTANEOUS
  Administered 2016-09-01: 2 [IU] via SUBCUTANEOUS
  Administered 2016-09-01 – 2016-09-02 (×5): 1 [IU] via SUBCUTANEOUS
  Administered 2016-09-02: 2 [IU] via SUBCUTANEOUS
  Administered 2016-09-02: 1 [IU] via SUBCUTANEOUS
  Administered 2016-09-02 – 2016-09-03 (×3): 2 [IU] via SUBCUTANEOUS
  Administered 2016-09-03: 3 [IU] via SUBCUTANEOUS
  Administered 2016-09-03 (×2): 2 [IU] via SUBCUTANEOUS
  Administered 2016-09-03: 3 [IU] via SUBCUTANEOUS
  Administered 2016-09-04 (×2): 5 [IU] via SUBCUTANEOUS

## 2016-08-30 NOTE — Consult Note (Addendum)
   [facility]   08/30/2016  [name] [birth year] [id number]    Patient screened for [facility] Care Management program. [name]. He is currently in stepdown unit. Will continue to follow along and engage if and when appropriate. Spoke with inpatient RNCM.   Raiford NobleAtika Hall, MSN-Ed, RN,BSN [facility] Liaison [phone]

## 2016-08-30 NOTE — Progress Notes (Signed)
  Progress Note    08/30/2016 8:19 AM 5 Days Post-Op  Subjective:  "I'm feeling rough this morning"  Denies passing gas  Tm 99.1 now afebrile HR  70's-110's NSR/ST 140's-190's systolic 92%  RA  Vitals:   08/30/16 0310 08/30/16 0800  BP: (!) 177/71 (!) 190/75  Pulse: 89 99  Resp: 19 (!) 21  Temp: 98.6 F (37 C)     Physical Exam: Cardiac:  regular Lungs:  Non labored Incisions:  Healing nicely Extremities:  2+ bilateral radial pulses and 1+ right PT and 2+ left PT palpable pulses Abdomen:  Distended; + occasional BS; -flatus  CBC    Component Value Date/Time   WBC 11.6 (H) 08/30/2016 0345   RBC 2.65 (L) 08/30/2016 0345   HGB 8.2 (L) 08/30/2016 0345   HCT 24.3 (L) 08/30/2016 0345   PLT 173 08/30/2016 0345   MCV 91.7 08/30/2016 0345   MCH 30.9 08/30/2016 0345   MCHC 33.7 08/30/2016 0345   RDW 15.4 08/30/2016 0345   LYMPHSABS 2.1 06/12/2016 1055   MONOABS 0.9 06/12/2016 1055   EOSABS 0.3 06/12/2016 1055   BASOSABS 0.0 06/12/2016 1055    BMET    Component Value Date/Time   NA 139 08/30/2016 0345   K 3.7 08/30/2016 0345   CL 112 (H) 08/30/2016 0345   CO2 19 (L) 08/30/2016 0345   GLUCOSE 172 (H) 08/30/2016 0345   BUN 32 (H) 08/30/2016 0345   CREATININE 1.49 (H) 08/30/2016 0345   CALCIUM 8.5 (L) 08/30/2016 0345   GFRNONAA 44 (L) 08/30/2016 0345   GFRAA 51 (L) 08/30/2016 0345    INR    Component Value Date/Time   INR 1.23 08/30/2016 0345     Intake/Output Summary (Last 24 hours) at 08/30/16 0819 Last data filed at 08/29/16 2324  Gross per 24 hour  Intake           273.75 ml  Output              450 ml  Net          -176.25 ml     Assessment:  75 y.o. male is s/p:  complex thoracic aneurysm repair  5 Days Post-Op  Plan: -pt with post operative ileus-will get IR to place NGT.  Ileus not much different this am; denies flatus;  -DVT prophylaxis:  Received Lovenox last pm-hold this morning.  Pt has not received coumadin in a couple of  days--today's INR is 1.23. -hypertensive-requiring NTG gtt; use prn hydralazine as well.   Doreatha MassedSamantha Rhyne, PA-C [facility] [phone] 08/30/2016 8:19 AM  I agree with the above.  Patient is markedly distended this morning.  Attempts at NG tube placement were unsuccessful last night.  We'll have radiology place NG tube today.  Durene CalWells Aarti Mankowski

## 2016-08-30 NOTE — Progress Notes (Signed)
Inpatient Diabetes Program Recommendations  AACE/ADA: New Consensus Statement on Inpatient Glycemic Control (2015)  Target Ranges:  Prepandial:   less than 140 mg/dL      Peak postprandial:   less than 180 mg/dL (1-2 hours)      Critically ill patients:  140 - 180 mg/dL    Review of Glycemic Control Results for [name], [name] (MRN [id number]) as of 08/30/2016 14:07  Ref. Range 08/29/2016 11:48 08/29/2016 17:18 08/29/2016 21:28 08/30/2016 08:32 08/30/2016 12:10  Glucose-Capillary Latest Ref Range: 65 - 99 mg/dL 086161 (H) 578148 (H) 469121 (H) 210 (H) 155 (H)  Results for [name], [name] (MRN [id number]) as of 08/30/2016 14:07  Ref. Range 08/27/2016 08:40  Hemoglobin A1C Latest Ref Range: 4.8 - 5.6 % 6.5 (H)   Diabetes history: DM Outpatient Diabetes medications: Glipizide 5 mg BID Current orders for Inpatient glycemic control: Novolog 0-9 units correction Q4H, Tradjenta 5 mg daily  Inpatient Diabetes Program Recommendations: Agree with current plan.  Will continue to monitor.  Thank you,  [name] LineaKaren Hadas Jessop, RN, BSN Diabetes Coordinator Inpatient Diabetes Program [phone] (Team Pager)

## 2016-08-30 NOTE — Care Management Important Message (Signed)
Important Message  Patient Details  Name: [name] MRN: [id number] Date of Birth: 15-May-1941   Medicare Important Message Given:  Yes    [name] 08/30/2016, 1:44 PM

## 2016-08-30 NOTE — Progress Notes (Signed)
Pt SBP 170s-180s, unable to attain sbp goals through hydralazine and nitro titration.  MD Randie Heinzain on call notified and will speak to Dr. Myra GianottiBrabham in AM, but to continue to utilize current orders to decrease blood pressure.  Will continue to monitor.

## 2016-08-30 NOTE — Progress Notes (Deleted)
Spoke with wound RN about Prevana wound vac.  Foam will need to be removed 7 days after placement (Oct 20), thrown away, and then future dressing orders per MD instruction. 

## 2016-08-30 NOTE — Progress Notes (Signed)
[address]       [place] [address]             [phone]      5 Days Post-Op Procedure(s) (LRB): THORACIC AORTIC ENDOVASCULAR STENT GRAFT WITH RETROPERITONEAL EXPOSURE (N/A) Subjective: Small amts of flatus, no BM, rough night, abdomen distended. ,now with ng tube in place after put in by radiology Objective: Vital signs in last 24 hours: Temp:  [98.5 F (36.9 C)-99.1 F (37.3 C)] 98.9 F (37.2 C) (10/16 1500) Pulse Rate:  [78-108] 80 (10/16 1500) Cardiac Rhythm: Normal sinus rhythm (10/16 1500) Resp:  [19-24] 21 (10/16 1500) BP: (141-191)/(58-75) 155/69 (10/16 1500) SpO2:  [90 %-96 %] 96 % (10/16 1500)  Hemodynamic parameters for last 24 hours:    Intake/Output from previous day: 10/15 0701 - 10/16 0700 In: 273.8 [I.V.:273.8] Out: 450 [Urine:450] Intake/Output this shift: Total I/O In: 1200 [I.V.:1200] Out: 800 [Urine:800]  General appearance: alert, cooperative and no distress Heart: regular rate and rhythm Lungs: clear to auscultation bilaterally Abdomen: mod distension, + ttp to LLQ without guardin or rebound Extremities: warm, absent pulses in feet, no edema Wound: incis healing well  Lab Results:  Recent Labs  08/29/16 1015 08/30/16 0345  WBC 17.1* 11.6*  HGB 7.9* 8.2*  HCT 23.9* 24.3*  PLT 163 173   BMET:   Recent Labs  08/29/16 1015 08/30/16 0345  NA 139 139  K 3.4* 3.7  CL 111 112*  CO2 21* 19*  GLUCOSE 227* 172*  BUN 29* 32*  CREATININE 1.26* 1.49*  CALCIUM 8.2* 8.5*    PT/INR:   Recent Labs  08/30/16 0345  LABPROT 15.5*  INR 1.23   ABG    Component Value Date/Time   PHART 7.406 08/17/2016 1714   HCO3 23.7 09/04/2016 1714   TCO2 25 08/24/2016 1714   ACIDBASEDEF 1.0 08/29/2016 1714   O2SAT 94.0 09/12/2016 1714   CBG (last 3)   Recent Labs  08/30/16 0832 08/30/16 1210 08/30/16 1717  GLUCAP 210* 155* 158*    Meds Scheduled Meds: . amLODipine  10 mg Oral Q supper  . aspirin EC  81 mg  Oral QPM  . atorvastatin  40 mg Oral QPM  . chlorhexidine  15 mL Mouth Rinse BID  . cloNIDine  0.2 mg Oral TID  . docusate sodium  100 mg Oral Daily  . enoxaparin (LOVENOX) injection  60 mg Subcutaneous Q12H  . insulin aspart  0-9 Units Subcutaneous Q4H  . linagliptin  5 mg Oral Daily  . losartan  100 mg Oral Q lunch  . mouth rinse  15 mL Mouth Rinse q12n4p  . metoCLOPramide (REGLAN) injection  10 mg Intravenous Q6H  . metoprolol tartrate  25 mg Oral BID  . mometasone-formoterol  2 puff Inhalation BID  . omega-3 acid ethyl esters  2 g Oral BID  . pantoprazole  40 mg Oral BID  . propafenone  225 mg Oral BID  . Vitamin D (Ergocalciferol)  50,000 Units Oral Q7 days   Continuous Infusions: . sodium chloride 50 mL/hr (08/28/16 0700)  . 0.9 % NaCl with KCl 20 mEq / L 75 mL/hr at 08/30/16 0659  . nitroGLYCERIN 110 mcg/min (08/30/16 1048)   PRN Meds:.sodium chloride, acetaminophen **OR** acetaminophen, alum & mag hydroxide-simeth, bisacodyl, docusate sodium, guaiFENesin-dextromethorphan, hydrALAZINE, morphine injection, ondansetron, oxyCODONE, phenol, polyethylene glycol, tamsulosin  Xrays Dg Abd 1 View  Result Date: 08/30/2016 CLINICAL DATA:  Nasogastric tube placement EXAM: ABDOMEN - 1  VIEW COMPARISON:  August 30, 2016 FLUOROSCOPY TIME:  0 minutes 42 seconds.  One acquired image FINDINGS: Feeding tube tip and side port in stomach. There are multiple loops of dilated bowel, unchanged. There is a stent in the thoracic aorta. Lung bases clear. IMPRESSION: Nasogastric tube tip and side port in stomach. Persistent dilated bowel concerning for a degree of potential bowel obstruction. Electronically Signed   By: Bretta BangWilliam  Woodruff III M.D.   On: 08/30/2016 10:52   Dg Abd 2 Views  Result Date: 08/29/2016 CLINICAL DATA:  Abdominal distention.  No bowel movement for 4 days. EXAM: ABDOMEN - 2 VIEW COMPARISON:  Two days ago FINDINGS: Ongoing gaseous distension of predominately small bowel loops.  Colonic loops are less distended. Some stool is seen along the left colon. There is moderate pneumoperitoneum with Rigler's sign on the left and subdiaphragmatic gas on the right. Right-sided diaphragmatic gas is similar to chest x-ray 2 days ago. Retroperitoneal gas in the right iliac fossa is decreasing. Asymmetric lung markings at the left base, which have been seen on recent imaging, including 07/05/2016 chest CT. These results were called by telephone at the time of interpretation on 08/29/2016 at 3:42 pm to Dr. Lemar LivingsBRANDON CAIN , who verbally acknowledged these results. IMPRESSION: 1. Unchanged gaseous bowel distention with fluid levels, likely ileus. 2. Moderate pneumoperitoneum, also seen on chest x-ray 2 days ago. Surgery was retroperitoneal approach but reportedly the peritoneum was opened. Electronically Signed   By: Marnee SpringJonathon  Watts M.D.   On: 08/29/2016 15:44   Dg Abd Portable 1v  Result Date: 08/30/2016 CLINICAL DATA:  Ileus. History of thoracic aortic endovascular stent graft with retroperitoneal exposure. EXAM: PORTABLE ABDOMEN - 1 VIEW COMPARISON:  08/29/2016 FINDINGS: Single view of the abdomen again demonstrates a stent graft in the thoracoabdominal aorta. Large amount of bowel gas throughout the abdomen. Small bowel loops continue to be dilated, measuring up to 4.4 cm. There appears to be gas within the rectum. Difficult to exclude pneumoperitoneum on this single supine image. IMPRESSION: Persistent gas-filled loops of bowel throughout the abdomen with small bowel dilatation. No significant change since the previous examination. Findings would be compatible with an ileus. Limited evaluation for free air on this image. Electronically Signed   By: Richarda OverlieAdam  Henn M.D.   On: 08/30/2016 08:04   Dg Vangie BickerNaso G Tube Plc W/fl-no Rad  Result Date: 08/30/2016 CLINICAL DATA:  NASO G TUBE PLACEMENT WITH FLUORO Fluoroscopy was utilized by the requesting physician.  No radiographic interpretation.     Assessment/Plan: S/P Procedure(s) (LRB): THORACIC AORTIC ENDOVASCULAR STENT GRAFT WITH RETROPERITONEAL EXPOSURE (N/A)  1 now  npo with NG tube in place  . Abdomen distended  cont to monitor  coumadin currently on hold  add K+ to IVF  LOS: 5 days    Delight Ovensdward B Nelsie Domino 08/30/2016

## 2016-08-31 ENCOUNTER — Inpatient Hospital Stay (HOSPITAL_COMMUNITY): Payer: Medicare Other

## 2016-08-31 LAB — BASIC METABOLIC PANEL
ANION GAP: 7 (ref 5–15)
BUN: 32 mg/dL — ABNORMAL HIGH (ref 6–20)
CALCIUM: 8.1 mg/dL — AB (ref 8.9–10.3)
CHLORIDE: 114 mmol/L — AB (ref 101–111)
CO2: 20 mmol/L — AB (ref 22–32)
Creatinine, Ser: 1.44 mg/dL — ABNORMAL HIGH (ref 0.61–1.24)
GFR calc Af Amer: 53 mL/min — ABNORMAL LOW (ref >60)
GFR calc non Af Amer: 46 mL/min — ABNORMAL LOW (ref >60)
GLUCOSE: 147 mg/dL — AB (ref 65–99)
Potassium: 3.9 mmol/L (ref 3.5–5.1)
Sodium: 141 mmol/L (ref 135–145)

## 2016-08-31 LAB — GLUCOSE, CAPILLARY
GLUCOSE-CAPILLARY: 164 mg/dL — AB (ref 65–99)
Glucose-Capillary: 130 mg/dL — ABNORMAL HIGH (ref 65–99)
Glucose-Capillary: 161 mg/dL — ABNORMAL HIGH (ref 65–99)
Glucose-Capillary: 154 mg/dL — ABNORMAL HIGH (ref 65–99)
Glucose-Capillary: 110 mg/dL — ABNORMAL HIGH (ref 65–99)

## 2016-08-31 LAB — TYPE AND SCREEN
ABO/RH(D): A POS
ANTIBODY SCREEN: NEGATIVE
DONOR AG TYPE: NEGATIVE
Donor AG Type: NEGATIVE
UNIT DIVISION: 0
UNIT DIVISION: 0
Unit division: 0

## 2016-08-31 LAB — CBC
HEMATOCRIT: 21.6 % — AB (ref 39.0–52.0)
Hemoglobin: 7.2 g/dL — ABNORMAL LOW (ref 13.0–17.0)
MCH: 30.9 pg (ref 26.0–34.0)
MCHC: 33.3 g/dL (ref 30.0–36.0)
MCV: 92.7 fL (ref 78.0–100.0)
Platelets: 145 10*3/uL — ABNORMAL LOW (ref 150–400)
RBC: 2.33 MIL/uL — ABNORMAL LOW (ref 4.22–5.81)
RDW: 16 % — AB (ref 11.5–15.5)
WBC: 11.1 10*3/uL — AB (ref 4.0–10.5)

## 2016-08-31 LAB — PROTIME-INR
INR: 1.28
Prothrombin Time: 16.1 seconds — ABNORMAL HIGH (ref 11.4–15.2)

## 2016-08-31 MED ORDER — WHITE PETROLATUM GEL
Status: AC
  Administered 2016-08-31: 13:00:00
  Filled 2016-08-31: qty 1

## 2016-08-31 NOTE — Progress Notes (Signed)
  [facility] Progress Note  Subjective  - POD #6  Had some nausea when going down for x-ray this am. Denies current nausea. Abdominal pain a little better.   Objective Vitals:   08/31/16 0530 08/31/16 0730  BP: (!) 145/65 (!) 156/53  Pulse: 69 77  Resp: 17 (!) 21  Temp:  99 F (37.2 C)    Intake/Output Summary (Last 24 hours) at 08/31/16 0849 Last data filed at 08/31/16 0730  Gross per 24 hour  Intake          3569.86 ml  Output             1575 ml  Net          1994.86 ml   Abdomen distended. Mild tenderness diffusely. Incisions c/d/i Palpable radial pulses b/l. Palpable left PT. Feet warm, sensation and motor intact bilaterally.   Assessment/Planning: 75 y.o. male is s/p: TEVAR with retroperitoneal exposure 6 Days Post-Op   Ileus: Abd x-ray this am showing continued ileus vs poss SBO. Leave NGT and keep NPO. Afebrile. Leukocytosis improving.  ABLA: Hgb 7.2 this am. Down slightly from yesterday. Tolerating. No transfusion yet. Coumadin on hold. Repeat CBC tomorrow.   Mobilize today.   Raymond GurneyKimberly A Trinh 08/31/2016 8:49 AM --  Laboratory CBC    Component Value Date/Time   WBC 11.1 (H) 08/31/2016 0500   HGB 7.2 (L) 08/31/2016 0500   HCT 21.6 (L) 08/31/2016 0500   PLT 145 (L) 08/31/2016 0500    BMET    Component Value Date/Time   NA 141 08/31/2016 0500   K 3.9 08/31/2016 0500   CL 114 (H) 08/31/2016 0500   CO2 20 (L) 08/31/2016 0500   GLUCOSE 147 (H) 08/31/2016 0500   BUN 32 (H) 08/31/2016 0500   CREATININE 1.44 (H) 08/31/2016 0500   CALCIUM 8.1 (L) 08/31/2016 0500   GFRNONAA 46 (L) 08/31/2016 0500   GFRAA 53 (L) 08/31/2016 0500    COAG Lab Results  Component Value Date   INR 1.28 08/31/2016   INR 1.23 08/30/2016   INR 1.26 08/29/2016   No results found for: PTT  Antibiotics Anti-infectives    Start     Dose/Rate Route Frequency Ordered Stop   08/26/16 1000  levofloxacin (LEVAQUIN) tablet 750 mg  Status:  Discontinued     750  mg Oral Daily 09/03/2016 1624 08/26/16 0809   08/19/2016 1630  cefUROXime (ZINACEF) 1.5 g in dextrose 5 % 50 mL IVPB     1.5 g 100 mL/hr over 30 Minutes Intravenous Every 12 hours 08/29/2016 1624 08/26/16 0430   08/15/2016 0705  dextrose 5 % with cefUROXime (ZINACEF) ADS Med    Comments:  Scronce, Trina   : cabinet override      08/23/2016 0705 09/05/2016 0842   08/20/2016 0604  cefUROXime (ZINACEF) 1.5 g in dextrose 5 % 50 mL IVPB     1.5 g 100 mL/hr over 30 Minutes Intravenous 30 min pre-op 08/26/2016 0604 08/30/2016 0842       Maris BergerKimberly Trinh, PA-C [facility] Office: [phone] Pager: [phone] 08/31/2016 8:49 AM  Feels  A little better with NG in place.  No flatus Abd still distended but soft WBC down to 11K Continue to encourage ambulation. Will need to consider IV nutrition if ileus does not resolve soon  [name] CalWells Demaris [name]

## 2016-08-31 NOTE — Progress Notes (Addendum)
      [address]       [place] [id number]             [phone]      6 Days Post-Op Procedure(s) (LRB): THORACIC AORTIC ENDOVASCULAR STENT GRAFT WITH RETROPERITONEAL EXPOSURE (N/A)   Subjective:  Continues to have abdominal pain, but states it is a little better.  The pain continues to be mostly on left side.  He denies N/V and has not yet moved his bowels  Objective: Vital signs in last 24 hours: Temp:  [98.5 F (36.9 C)-98.9 F (37.2 C)] 98.8 F (37.1 C) (10/17 0330) Pulse Rate:  [64-108] 77 (10/17 0730) Cardiac Rhythm: Normal sinus rhythm;Bundle branch block (10/17 0730) Resp:  [13-22] 21 (10/17 0730) BP: (124-173)/(50-75) 156/53 (10/17 0730) SpO2:  [89 %-97 %] 90 % (10/17 0730)  Intake/Output from previous day: 10/16 0701 - 10/17 0700 In: 4363.6 [I.V.:4353.6] Out: 1305 [Urine:1075; Emesis/NG output:230] Intake/Output this shift: Total I/O In: -  Out: 70 [Emesis/NG output:70]  General appearance: alert, cooperative and no distress Heart: regular rate and rhythm Lungs: clear to auscultation bilaterally Abdomen: soft, + distention, + tender to palpation LLQ, + BS Wound: clean and dry  Lab Results:  Recent Labs  08/30/16 0345 08/31/16 0500  WBC 11.6* 11.1*  HGB 8.2* 7.2*  HCT 24.3* 21.6*  PLT 173 145*   BMET:  Recent Labs  08/30/16 0345 08/31/16 0500  NA 139 141  K 3.7 3.9  CL 112* 114*  CO2 19* 20*  GLUCOSE 172* 147*  BUN 32* 32*  CREATININE 1.49* 1.44*  CALCIUM 8.5* 8.1*    PT/INR:  Recent Labs  08/31/16 0500  LABPROT 16.1*  INR 1.28   ABG    Component Value Date/Time   PHART 7.406 05/01/2016 1714   HCO3 23.7 05/01/2016 1714   TCO2 25 05/01/2016 1714   ACIDBASEDEF 1.0 05/01/2016 1714   O2SAT 94.0 05/01/2016 1714   CBG (last 3)   Recent Labs  08/30/16 1941 08/30/16 2258 08/31/16 0328  GLUCAP 134* 103* 130*    Assessment/Plan: S/P Procedure(s) (LRB): THORACIC AORTIC ENDOVASCULAR STENT GRAFT WITH  RETROPERITONEAL EXPOSURE (N/A)  1. GI- + ileus vs. SBO- abdomen is not acute, mild improvement today, NG tube in place and will leave today...remains afebrile, no leukocytosis.. May ultimately need PICC line and TNA if bowels dont advance and patient continues to be NPO 2. Expected post operative blood loss anemia- Hgb 7.2 which is mostly stable... Will repeat in AM if drops further may need to transfuse 3. Diet- remains NPO, continue IV fluids 4. Dispo- continue current care, repeat ABD film in AM, continue NG tube, monitor abdomen  LOS: 6 days    BARRETT, ERIN 08/31/2016  Post op complication of ileus, requiring ng tube now  Abdomen not lenter and less distended  May need pic and iv nutrition soon

## 2016-09-01 ENCOUNTER — Inpatient Hospital Stay (HOSPITAL_COMMUNITY): Payer: Medicare Other

## 2016-09-01 LAB — BASIC METABOLIC PANEL
Anion gap: 7 (ref 5–15)
BUN: 29 mg/dL — ABNORMAL HIGH (ref 6–20)
CHLORIDE: 114 mmol/L — AB (ref 101–111)
CO2: 19 mmol/L — ABNORMAL LOW (ref 22–32)
CREATININE: 1.37 mg/dL — AB (ref 0.61–1.24)
Calcium: 8.1 mg/dL — ABNORMAL LOW (ref 8.9–10.3)
GFR, EST AFRICAN AMERICAN: 57 mL/min — AB (ref >60)
GFR, EST NON AFRICAN AMERICAN: 49 mL/min — AB (ref >60)
Glucose, Bld: 173 mg/dL — ABNORMAL HIGH (ref 65–99)
POTASSIUM: 4 mmol/L (ref 3.5–5.1)
SODIUM: 140 mmol/L (ref 135–145)

## 2016-09-01 LAB — CBC
HCT: 22.4 % — ABNORMAL LOW (ref 39.0–52.0)
Hemoglobin: 7.6 g/dL — ABNORMAL LOW (ref 13.0–17.0)
MCH: 31.3 pg (ref 26.0–34.0)
MCHC: 33.9 g/dL (ref 30.0–36.0)
MCV: 92.2 fL (ref 78.0–100.0)
PLATELETS: 173 10*3/uL (ref 150–400)
RBC: 2.43 MIL/uL — AB (ref 4.22–5.81)
RDW: 16.2 % — ABNORMAL HIGH (ref 11.5–15.5)
WBC: 10.9 10*3/uL — AB (ref 4.0–10.5)

## 2016-09-01 LAB — PROTIME-INR
INR: 1.27
Prothrombin Time: 16 seconds — ABNORMAL HIGH (ref 11.4–15.2)

## 2016-09-01 LAB — GLUCOSE, CAPILLARY
GLUCOSE-CAPILLARY: 141 mg/dL — AB (ref 65–99)
GLUCOSE-CAPILLARY: 136 mg/dL — AB (ref 65–99)
GLUCOSE-CAPILLARY: 130 mg/dL — AB (ref 65–99)
Glucose-Capillary: 160 mg/dL — ABNORMAL HIGH (ref 65–99)
Glucose-Capillary: 144 mg/dL — ABNORMAL HIGH (ref 65–99)
Glucose-Capillary: 141 mg/dL — ABNORMAL HIGH (ref 65–99)
Glucose-Capillary: 150 mg/dL — ABNORMAL HIGH (ref 65–99)

## 2016-09-01 MED ORDER — SODIUM CHLORIDE 0.9% FLUSH: 10.0000 mL | INTRAVENOUS | Status: DC | PRN

## 2016-09-01 MED ORDER — SODIUM CHLORIDE 0.9% FLUSH
10.0000 mL | Freq: Two times a day (BID) | INTRAVENOUS | Status: DC
Start: 2016-09-01 — End: 2016-09-10
  Administered 2016-09-01: 20 mL
  Administered 2016-09-02: 10 mL
  Administered 2016-09-02 – 2016-09-03 (×2): 20 mL
  Administered 2016-09-06 – 2016-09-08 (×3): 10 mL

## 2016-09-01 NOTE — Progress Notes (Addendum)
[address]       [place] [address]             [phone]      7 Days Post-Op Procedure(s) (LRB): THORACIC AORTIC ENDOVASCULAR STENT GRAFT WITH RETROPERITONEAL EXPOSURE (N/A)   Subjective:  [name] has no new complaints.  He denies N/V.  Has still not moved his bowels.  He is passing gas.  Objective: Vital signs in last 24 hours: Temp:  [98.1 F (36.7 C)-99 F (37.2 C)] 98.1 F (36.7 C) (10/18 0700) Pulse Rate:  [55-83] 78 (10/18 0900) Cardiac Rhythm: Normal sinus rhythm (10/18 0900) Resp:  [15-23] 23 (10/18 0900) BP: (129-171)/(54-72) 160/67 (10/18 0900) SpO2:  [89 %-95 %] 95 % (10/18 0906)  Intake/Output from previous day: 10/17 0701 - 10/18 0700 In: 2365.6 [I.V.:2345.6] Out: 2020 [Urine:1800; Emesis/NG output:220] Intake/Output this shift: Total I/O In: 719.4 [I.V.:719.4] Out: 350 [Emesis/NG output:350]  General appearance: alert, cooperative and no distress Heart: regular rate and rhythm Lungs: clear to auscultation bilaterally Abdomen: soft, + distention, denies pain to palpation, + BS Wound: clean and dry  Lab Results:  Recent Labs  08/31/16 0500 09/01/16 0335  WBC 11.1* 10.9*  HGB 7.2* 7.6*  HCT 21.6* 22.4*  PLT 145* 173   BMET:  Recent Labs  08/31/16 0500 09/01/16 0335  NA 141 140  K 3.9 4.0  CL 114* 114*  CO2 20* 19*  GLUCOSE 147* 173*  BUN 32* 29*  CREATININE 1.44* 1.37*  CALCIUM 8.1* 8.1*    PT/INR:  Recent Labs  09/01/16 0335  LABPROT 16.0*  INR 1.27   ABG    Component Value Date/Time   PHART 7.406 09/05/16 1714   HCO3 23.7 09/05/16 1714   TCO2 25 09-05-16 1714   ACIDBASEDEF 1.0 September 05, 2016 1714   O2SAT 94.0 05-Sep-2016 1714   CBG (last 3)   Recent Labs  08/31/16 2311 09/01/16 0316 09/01/16 0816  GLUCAP 110* 160* 144*   Chronic Kidney Disease   Stage I     GFR >90  Stage II    GFR 60-89  Stage IIIA GFR 45-59  Stage IIIB GFR 30-44  Stage IV   GFR 15-29  Stage V    GFR   <15  Lab Results  Component Value Date   CREATININE 1.37 (H) 09/01/2016   Estimated Creatinine Clearance: 39.5 mL/min (by C-G formula based on SCr of 1.37 mg/dL (H)).  Assessment/Plan: S/P Procedure(s) (LRB): THORACIC AORTIC ENDOVASCULAR STENT GRAFT WITH RETROPERITONEAL EXPOSURE (N/A)  1. Post Operative Ileus- ABD film is unchanged, NG tube continues to drain, remains afebrile- will leave NG tube in place, continue IV fluids, may benefit from attempted enema 2. Expected post operative blood loss anemia- Hgb stable at 7.6, no acute signs of bleeding.. No transfusion at this time 3. CV- HTN- on Lopressor, Clonidine, Norvasc, Cozaar, Hydralazine prn 4. GI- remains NPO, may need to start TPN if doesn't move bowels today, continue IV fluids 5. Dispo- patient stable, Ileus unchanged on ABD film, patient passing gas but no BM yet... May benefit from enema   LOS: 7 days    BARRETT, ERIN 09/01/2016  Continued output from NG tube 350 past 12 hours- replace electrolytes as need, watch renal function  Passing some gas, xray unchanged dilated small bowel not colonic ileus, keep ng  Place pic and start   tna as unlikely to be back eating in next 48 hours  I have seen and examined Levi L  Kostka and agree with the above assessmentJennette Kettle  and plan.  Delight OvensEdward B Cathline Dowen MD Beeper [phone] Office [phone] 09/01/2016 10:21 AM

## 2016-09-01 NOTE — Progress Notes (Signed)
Nutrition Consult/Follow Up  DOCUMENTATION CODES:   Severe malnutrition in context of chronic illness  INTERVENTION:    TPN per pharmacy  NUTRITION DIAGNOSIS:   Malnutrition related to chronic illness as evidenced by severe depletion of muscle mass, severe depletion of body fat, ongoing  GOAL:   Patient will meet greater than or equal to 90% of their needs, currently unmet  MONITOR:   Diet advancement, Labs, Weight trends, Skin, I & O's, TPN prescription  ASSESSMENT:   75 year old male, PMH DM, COPD, GERD, HTN, pancreatitis, stroke. Patient has known severe peripheral vascular disease followed by  Dr. Hart RochesterLawson for  his left lower extremity femoral to posterior tibial bypass which was done using composite bilateral cephalic vein grafts in 2007. He has had previous left carotid endarterectomy and redo left carotid endarterectomy most recent of which was 2007.  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 Pt with post-op ileus >> mostly NPO since admission >> NGT in place to LIS. Malnutrition identified with initial assessment and is ongoing. PICC line unable to be placed today >> TPN start date now 10/19. Labs and medications reviewed.  Diet Order:  Diet NPO time specified Except for: Sips with Meds  Skin:   (Incision on abdomen)  Last BM:  10/11   CBG (last 3)   Recent Labs  09/01/16 0316 09/01/16 0816 09/01/16 1213  GLUCAP 160* 144* 141*   Height:   Ht Readings from Last 1 Encounters:  12-13-15 5\' 9"  (1.753 m)    Weight:   Wt Readings from Last 1 Encounters:  12-13-15 132 lb (59.9 kg)    Ideal Body Weight:  72.7 kg  BMI:  Body mass index is 19.49 kg/m.  Estimated Nutritional Needs:   Kcal:  1800-2000  Protein:  80-95 grams  Fluid:  1.8-2 L/day  EDUCATION NEEDS:   No education needs identified at  this time  Maureen ChattersKatie Kiara Keep, RD, LDN Pager #: [phone] After-Hours Pager #: [phone]

## 2016-09-01 NOTE — Progress Notes (Signed)
Peripherally Inserted Central Catheter/Midline Placement  The IV Nurse has discussed with the patient and/or persons authorized to consent for the patient, the purpose of this procedure and the potential benefits and risks involved with this procedure.  The benefits include less needle sticks, lab draws from the catheter, and the patient may be discharged home with the catheter. Risks include, but not limited to, infection, bleeding, blood clot (thrombus formation), and puncture of an artery; nerve damage and irregular heartbeat and possibility to perform a PICC exchange if needed/ordered by physician.  Alternatives to this procedure were also discussed.  Bard Power PICC patient education guide, fact sheet on infection prevention and patient information card has been provided to patient /or left at bedside.    PICC/Midline Placement Documentation     CConsent signed by Wife at bedside   [name], [name] 09/01/2016, 6:43 PM

## 2016-09-01 NOTE — Progress Notes (Signed)
    Subjective  -  No N/V   Physical Exam:  abd distended Incision intact       Assessment/Plan:    Start TNA Keep NG & NPO Replace electrolytes Hb stable   Brabham, Wells 09/01/2016 8:48 PM --  Vitals:   09/01/16 2000 09/01/16 2029  BP: (!) 144/57   Pulse: 74   Resp: 19   Temp:  99 F (37.2 C)    Intake/Output Summary (Last 24 hours) at 09/01/16 2048 Last data filed at 09/01/16 1900  Gross per 24 hour  Intake          2864.31 ml  Output             2675 ml  Net           189.31 ml     Laboratory CBC    Component Value Date/Time   WBC 10.9 (H) 09/01/2016 0335   HGB 7.6 (L) 09/01/2016 0335   HCT 22.4 (L) 09/01/2016 0335   PLT 173 09/01/2016 0335    BMET    Component Value Date/Time   NA 140 09/01/2016 0335   K 4.0 09/01/2016 0335   CL 114 (H) 09/01/2016 0335   CO2 19 (L) 09/01/2016 0335   GLUCOSE 173 (H) 09/01/2016 0335   BUN 29 (H) 09/01/2016 0335   CREATININE 1.37 (H) 09/01/2016 0335   CALCIUM 8.1 (L) 09/01/2016 0335   GFRNONAA 49 (L) 09/01/2016 0335   GFRAA 57 (L) 09/01/2016 0335    COAG Lab Results  Component Value Date   INR 1.27 09/01/2016   INR 1.28 08/31/2016   INR 1.23 08/30/2016   No results found for: PTT  Antibiotics Anti-infectives    Start     Dose/Rate Route Frequency Ordered Stop   08/26/16 1000  levofloxacin (LEVAQUIN) tablet 750 mg  Status:  Discontinued     750 mg Oral Daily 08/15/2016 1624 08/26/16 0809   09/14/2016 1630  cefUROXime (ZINACEF) 1.5 g in dextrose 5 % 50 mL IVPB     1.5 g 100 mL/hr over 30 Minutes Intravenous Every 12 hours 09/11/2016 1624 08/26/16 0430   08/30/2016 0705  dextrose 5 % with cefUROXime (ZINACEF) ADS Med    Comments:  Scronce, Trina   : cabinet override      09/04/2016 0705 08/24/2016 0842   08/28/2016 0604  cefUROXime (ZINACEF) 1.5 g in dextrose 5 % 50 mL IVPB     1.5 g 100 mL/hr over 30 Minutes Intravenous 30 min pre-op 09/02/2016 0604 09/02/2016 0842       V. Charlena CrossWells Brabham IV,  M.D. [facility] [place] Office: [phone] Pager:  [phone]

## 2016-09-01 NOTE — Progress Notes (Signed)
PHARMACY - ADULT TOTAL PARENTERAL NUTRITION CONSULT NOTE   Pharmacy Consult for TPN Indication: Ileus  Patient Measurements: Height: 5\' 9"  (175.3 cm) (Simultaneous filing. User may not have seen previous data.) Weight: 132 lb (59.9 kg) (Simultaneous filing. User may not have seen previous data.) IBW/kg (Calculated) : 70.7 TPN AdjBW (KG): 59.9 Body mass index is 19.49 kg/m.  Assessment: [name] who underwent a complex thoracic aneurysm repair with stent placement on 10/11. The patient is noted to have an ileus post-op and pharmacy has been consulted to start TPN for nutrition.  GI: Post-op ileus - very minimal intake charted 10/13-10/14 (clears), so essentially NPO for >7d. LBM 10/11 - bowel sounds + flatus. Per discussion with MD despite impending Clinimix shortage - given unlikely diet resumption in the next 48 hours - wants to continue with TPN start today. PICC line ordered to be placed. IV team was contacted regarding PICC placement and they are unable to place today - due to this, will plan to start TPN on 10/19.  Endo: Hx DM. CBGs/24h: 110-173. Currently on sSSI q4h + tradjenta Insulin requirements in the past 24 hours: 8 units Lytes: Na 140, K 4 - on NS + 20 mEq at 75 cc/hr Renal: SCr 1.37 << 1.44, CrCl~40 ml/min Pulm: 94%/3L-[place] Cards: Hx Afib - full dose Enox + Warf. BP wnl-elevated, HR brady-wnl. On norvasc, atorva, clonidine, losartan, lopressor, fish oil, propafenone Hepatobil: AST/ALT mildly elevated on 10/16, Tbili wnl Neuro: A&Ox4, no c/o pain ID: Afebrile, WBC wnl, on no abx  Best Practices: Enox + Warf TPN Access: PICC line ordered TPN start date: Expected 10/19  Nutritional Goals (per RD recommendation on 10/12): KCal: 1800-2000  Protein: 80-95g  Goal Rate:  Clinimix E 5/15 at 80 ml/hr and 20% lipids at 10 ml/hr will provide 100% of kcal and protein needs  Current Nutrition: NPO  Plan:  - PICC line unable to be placed 10/18 - given this, will plan to start TPN  on 10/19.  - Monitor TPN labs as scheduled - F/U resolution of ileus and start of po intake  Thank you for allowing pharmacy to be a part of this patient's care.  Georgina PillionElizabeth Zyen Triggs, PharmD, BCPS Clinical Pharmacist Pager: [phone] Clinical phone for 09/01/2016 from 7a-3:30p: [phone] If after 3:30p, please call main pharmacy at: [phone] 09/01/2016 11:54 AM

## 2016-09-01 NOTE — Progress Notes (Signed)
ANTICOAGULATION CONSULT NOTE - Follow Up Consult  Pharmacy Consult for Lovenox/Coumadin Indication: atrial fibrillation.  Assessment: [name] on Coumadin PTA for AFib, now s/p thoracic aortic endovascular stent graft on 10/12. Pt currently on full-dose enoxaparin, originally planned to restart warfarin but due to ileus this has been on hold.   INR stable at 1.2 (last dose of warfarin was 10/14). Hgb low at 7.6 but stable based on trend, pltc ok. No bleeding issues noted. Abd x-ray 10/17 shows ileus vs sbo  Goal of Therapy:  INR 2-3 Anti-Xa level 0.6-1 units/ml 4hrs after LMWH dose given Monitor platelets by anticoagulation protocol: Yes   Plan:  -Continue Lovenox 60mg  [place] BID  -Continue to hold warfarin -Will resume daily INR once warfarin resumed  Allergies  Allergen Reactions  . Lopressor [Metoprolol Tartrate] Palpitations    LOPRESSOR bothers the patient.  . Niacin Swelling    FACIAL EDEMA    Patient Measurements: Height: 5\' 9"  (175.3 cm) (Simultaneous filing. User may not have seen previous data.) Weight: 132 lb (59.9 kg) (Simultaneous filing. User may not have seen previous data.) IBW/kg (Calculated) : 70.7 Heparin Dosing Weight: 59.9kg  Vital Signs: Temp: 98.3 F (36.8 C) (10/18 0314) Temp Source: Oral (10/18 0314) BP: 152/57 (10/18 0530) Pulse Rate: 64 (10/18 0530)  Labs:  Recent Labs  08/30/16 0345 08/31/16 0500 09/01/16 0335  HGB 8.2* 7.2* 7.6*  HCT 24.3* 21.6* 22.4*  PLT 173 145* 173  LABPROT 15.5* 16.1* 16.0*  INR 1.23 1.28 1.27  CREATININE 1.49* 1.44* 1.37*    Estimated Creatinine Clearance: 39.5 mL/min (by C-G formula based on SCr of 1.37 mg/dL (H)).   Sheppard CoilFrank Fredda Clarida PharmD., BCPS Clinical Pharmacist Pager [phone] 09/01/2016 8:13 AM

## 2016-09-02 ENCOUNTER — Inpatient Hospital Stay (HOSPITAL_COMMUNITY): Payer: Medicare Other

## 2016-09-02 LAB — COMPREHENSIVE METABOLIC PANEL
ALT: 41 U/L (ref 17–63)
AST: 27 U/L (ref 15–41)
Albumin: 2.1 g/dL — ABNORMAL LOW (ref 3.5–5.0)
Alkaline Phosphatase: 42 U/L (ref 38–126)
Anion gap: 5 (ref 5–15)
BUN: 25 mg/dL — ABNORMAL HIGH (ref 6–20)
CO2: 20 mmol/L — ABNORMAL LOW (ref 22–32)
Calcium: 7.8 mg/dL — ABNORMAL LOW (ref 8.9–10.3)
Chloride: 118 mmol/L — ABNORMAL HIGH (ref 101–111)
Creatinine, Ser: 1.33 mg/dL — ABNORMAL HIGH (ref 0.61–1.24)
GFR calc Af Amer: 59 mL/min — ABNORMAL LOW (ref >60)
GFR calc non Af Amer: 51 mL/min — ABNORMAL LOW (ref >60)
Glucose, Bld: 122 mg/dL — ABNORMAL HIGH (ref 65–99)
Potassium: 3.6 mmol/L (ref 3.5–5.1)
Sodium: 143 mmol/L (ref 135–145)
Total Bilirubin: 0.9 mg/dL (ref 0.3–1.2)
Total Protein: 5.1 g/dL — ABNORMAL LOW (ref 6.5–8.1)

## 2016-09-02 LAB — DIFFERENTIAL
BASOS ABS: 0 10*3/uL (ref 0.0–0.1)
Basophils Relative: 0 %
EOS ABS: 0.1 10*3/uL (ref 0.0–0.7)
Eosinophils Relative: 1 %
LYMPHS PCT: 10 %
Lymphs Abs: 1.2 10*3/uL (ref 0.7–4.0)
MONOS PCT: 10 %
Monocytes Absolute: 1.2 10*3/uL — ABNORMAL HIGH (ref 0.1–1.0)
NEUTROS ABS: 9.4 10*3/uL — AB (ref 1.7–7.7)
NEUTROS PCT: 79 %

## 2016-09-02 LAB — GLUCOSE, CAPILLARY
GLUCOSE-CAPILLARY: 116 mg/dL — AB (ref 65–99)
GLUCOSE-CAPILLARY: 145 mg/dL — AB (ref 65–99)
GLUCOSE-CAPILLARY: 158 mg/dL — AB (ref 65–99)
GLUCOSE-CAPILLARY: 128 mg/dL — AB (ref 65–99)
Glucose-Capillary: 159 mg/dL — ABNORMAL HIGH (ref 65–99)

## 2016-09-02 LAB — CBC
HCT: 21.8 % — ABNORMAL LOW (ref 39.0–52.0)
HEMOGLOBIN: 7.3 g/dL — AB (ref 13.0–17.0)
MCH: 30.7 pg (ref 26.0–34.0)
MCHC: 33.5 g/dL (ref 30.0–36.0)
MCV: 91.6 fL (ref 78.0–100.0)
Platelets: 189 10*3/uL (ref 150–400)
RBC: 2.38 MIL/uL — AB (ref 4.22–5.81)
RDW: 16.6 % — ABNORMAL HIGH (ref 11.5–15.5)
WBC: 11.9 10*3/uL — AB (ref 4.0–10.5)

## 2016-09-02 LAB — PHOSPHORUS: Phosphorus: 2.5 mg/dL (ref 2.5–4.6)

## 2016-09-02 LAB — PREPARE RBC (CROSSMATCH)

## 2016-09-02 LAB — MAGNESIUM: MAGNESIUM: 1.8 mg/dL (ref 1.7–2.4)

## 2016-09-02 LAB — TRIGLYCERIDES: Triglycerides: 66 mg/dL (ref <150)

## 2016-09-02 LAB — PREALBUMIN: Prealbumin: 5.1 mg/dL — ABNORMAL LOW (ref 18–38)

## 2016-09-02 MED ORDER — POTASSIUM CHLORIDE IN NACL 20-0.9 MEQ/L-% IV SOLN
INTRAVENOUS | Status: DC
  Administered 2016-09-02 – 2016-09-03 (×2): via INTRAVENOUS
  Filled 2016-09-02: qty 1000

## 2016-09-02 MED ORDER — CLONIDINE HCL 0.1 MG/24HR TD PTWK
0.1000 mg | MEDICATED_PATCH | TRANSDERMAL | Status: DC
  Administered 2016-09-02: 0.1 mg via TRANSDERMAL
  Filled 2016-09-02: qty 1

## 2016-09-02 MED ORDER — FAT EMULSION 20 % IV EMUL
120.0000 mL | INTRAVENOUS | Status: AC
  Administered 2016-09-02: 120 mL via INTRAVENOUS
  Filled 2016-09-02: qty 200

## 2016-09-02 MED ORDER — TRACE MINERALS CR-CU-MN-SE-ZN 10-1000-500-60 MCG/ML IV SOLN
INTRAVENOUS | Status: AC
  Administered 2016-09-02: 18:00:00 via INTRAVENOUS
  Filled 2016-09-02: qty 960

## 2016-09-02 MED ORDER — FAMOTIDINE IN NACL 20-0.9 MG/50ML-% IV SOLN
20.0000 mg | Freq: Two times a day (BID) | INTRAVENOUS | Status: DC
  Administered 2016-09-02 – 2016-09-07 (×11): 20 mg via INTRAVENOUS
  Filled 2016-09-02 (×10): qty 50

## 2016-09-02 MED ORDER — POTASSIUM CHLORIDE 10 MEQ/50ML IV SOLN
10.0000 meq | INTRAVENOUS | Status: AC
  Administered 2016-09-02 (×2): 10 meq via INTRAVENOUS

## 2016-09-02 MED ORDER — POTASSIUM CHLORIDE 10 MEQ/50ML IV SOLN
10.0000 meq | INTRAVENOUS | Status: AC | PRN
  Administered 2016-09-02 – 2016-09-03 (×4): 10 meq via INTRAVENOUS
  Filled 2016-09-02 (×6): qty 50

## 2016-09-02 MED ORDER — OXYCODONE HCL 5 MG PO TABS
20.0000 mg | ORAL_TABLET | Freq: Four times a day (QID) | ORAL | Status: DC | PRN
  Administered 2016-09-02 – 2016-09-03 (×2): 20 mg via ORAL
  Filled 2016-09-02 (×2): qty 4

## 2016-09-02 MED ORDER — METOPROLOL TARTRATE 5 MG/5ML IV SOLN
2.5000 mg | INTRAVENOUS | Status: DC | PRN
Start: 2016-09-02 — End: 2016-09-04
  Administered 2016-09-02 – 2016-09-04 (×7): 2.5 mg via INTRAVENOUS
  Filled 2016-09-02 (×8): qty 5

## 2016-09-02 MED ORDER — METOPROLOL TARTRATE 12.5 MG HALF TABLET
12.5000 mg | ORAL_TABLET | Freq: Two times a day (BID) | ORAL | Status: DC
  Filled 2016-09-02: qty 1

## 2016-09-02 MED ORDER — BISACODYL 10 MG RE SUPP
10.0000 mg | Freq: Once | RECTAL | Status: AC
  Administered 2016-09-02: 10 mg via RECTAL
  Filled 2016-09-02: qty 1

## 2016-09-02 NOTE — Progress Notes (Signed)
Paged k. Trinh PA regarding pt still having pt. mentionned to have rn notify dr gerhardt regarding renew pain meds. Paged dr gerhardt new orders rec'd for back pain

## 2016-09-02 NOTE — Progress Notes (Signed)
bp after hydralazine given   09/02/16 1738  Vitals  BP (!) 158/66  MAP (mmHg) 94  Pulse Rate 87  ECG Heart Rate 88  Resp 20  Oxygen Therapy  SpO2 93 %

## 2016-09-02 NOTE — Care Management Important Message (Signed)
Important Message  Patient Details  Name: [name] MRN: [id number] Date of Birth: [birth year]   Medicare Important Message Given:  Yes    [name] Abena 09/02/2016, 10:45 AM

## 2016-09-02 NOTE — Progress Notes (Deleted)
PRN 10mg  dose of Hydralazine IV administered. Will recheck BP in one hour.

## 2016-09-02 NOTE — Progress Notes (Addendum)
[address]       [place] [id number]             [phone]      8 Days Post-Op Procedure(s) (LRB): THORACIC AORTIC ENDOVASCULAR STENT GRAFT WITH RETROPERITONEAL EXPOSURE (N/A)   Subjective:  [name] states he feels about he same.  He denies N/V.  He feels like he has to move his bowels but has been unable to do so.  He is passing flatus.  Objective: Vital signs in last 24 hours: Temp:  [98.4 F (36.9 C)-99 F (37.2 C)] 98.4 F (36.9 C) (10/19 0700) Pulse Rate:  [68-84] 82 (10/19 0700) Cardiac Rhythm: Normal sinus rhythm (10/19 0700) Resp:  [17-26] 19 (10/19 0700) BP: (141-170)/(47-69) 170/64 (10/19 0700) SpO2:  [82 %-100 %] 94 % (10/19 0700) FiO2 (%):  [55 %-100 %] 100 % (10/18 1919)  Intake/Output from previous day: 10/18 0701 - 10/19 0700 In: 2904.7 [I.V.:2874.7; NG/GT:30] Out: 2325 [Urine:1225; Emesis/NG output:1100]  General appearance: alert, cooperative and no distress Heart: regular rate and rhythm Lungs: clear to auscultation bilaterally Abdomen: soft, non tender, mild distention, + BS Wound: clean and dry  Lab Results:  Recent Labs  09/01/16 0335 09/02/16 0420  WBC 10.9* 11.9*  HGB 7.6* 7.3*  HCT 22.4* 21.8*  PLT 173 189   BMET:  Recent Labs  09/01/16 0335 09/02/16 0420  NA 140 143  K 4.0 3.6  CL 114* 118*  CO2 19* 20*  GLUCOSE 173* 122*  BUN 29* 25*  CREATININE 1.37* 1.33*  CALCIUM 8.1* 7.8*    PT/INR:  Recent Labs  09/01/16 0335  LABPROT 16.0*  INR 1.27   ABG    Component Value Date/Time   PHART 7.406 08/31/2016 1714   HCO3 23.7 09/07/2016 1714   TCO2 25 08/22/2016 1714   ACIDBASEDEF 1.0 09/04/2016 1714   O2SAT 94.0 09/14/2016 1714   CBG (last 3)   Recent Labs  09/01/16 2323 09/02/16 0406 09/02/16 0747  GLUCAP 130* 116* 145*    Assessment/Plan: S/P Procedure(s) (LRB): THORACIC AORTIC ENDOVASCULAR STENT GRAFT WITH RETROPERITONEAL EXPOSURE (N/A)  1. Post operative Ileus-, ABD film  pending, NG tube with 1100 output yesterday, feels like needs to move bowels but has been unable will order enema for today 2. Expected post operative blood loss anemia- Hgb stable 3. GI- remains NPO, PICC line placed yesterday... Ordered TPN per pharmacy 4. CV- bradycardic at times, recovers quickly, BP controlled- will decrease lopressor to 12.5 mg BID, continue NTG drip 5. Dispo- continued ileus, start nutrition today, continue NG tube, can hopefully get BM with enema   LOS: 8 days  Chronic Kidney Disease   Stage I     GFR >90  Stage II    GFR 60-89  Stage IIIA GFR 45-59  Stage IIIB GFR 30-44  Stage IV   GFR 15-29  Stage V    GFR  <15  Lab Results  Component Value Date   CREATININE 1.33 (H) 09/02/2016   Estimated Creatinine Clearance: 40.7 mL/min (by C-G formula based on SCr of 1.33 mg/dL (H)).  BARRETT, ERIN 09/02/2016 hgb still low , with patients frail condition will transfuse one unit prbc - on full dose lovenox watch dose with decreased cr clearance  Have d/c most po meds , cut back on iv morphine  pic placed yesterday , still needs tna not started yet, 1100 out of ng needs replacement, replace k Change enema to suppository  Overall abdomen  less distended today but with high ng tube out put not ready to remove yet  I have seen and examined Debroah Baller and agree with the above assessment  and plan.  Delight Ovens MD Beeper [phone] Office [phone] 09/02/2016 8:34 AM

## 2016-09-02 NOTE — Progress Notes (Signed)
BP readings one hour after prn 10mg  dose of hydralazine.    09/02/16 2200  Vitals  BP (!) 166/90  MAP (mmHg) 112  Pulse Rate 87  ECG Heart Rate 85  Resp (!) 22  Oxygen Therapy  SpO2 95 %

## 2016-09-02 NOTE — Progress Notes (Signed)
PRN 10mg  dose of Hydralazine IV administered. Will recheck BP in one hour.    09/02/16 2106  Vitals  BP (!) 167/71  MAP (mmHg) 96  BP Location Left Arm  BP Method Automatic  Patient Position (if appropriate) Sitting  Pulse Rate 89  Pulse Rate Source Monitor  ECG Heart Rate 89  Cardiac Rhythm NSR;BBB

## 2016-09-02 NOTE — Progress Notes (Signed)
Oxy ir 20mg  given and lopressor 2.5mg  for bp

## 2016-09-02 NOTE — Progress Notes (Signed)
Throughout shift, pt's HR would periodically and briefly drop to 30s before returning to a baseline 70s-80s (NSR with RBBB). Pt asymptomatic during episodes. EKG obtained but unable to capture bradycardic event. 12 lead confirms already known rhythym of NSR with RBBB. Will convey to day team and continue to monitor.

## 2016-09-02 NOTE — Progress Notes (Addendum)
PHARMACY - ADULT TOTAL PARENTERAL NUTRITION CONSULT NOTE   Pharmacy Consult for TPN Indication: Prolonged ileus  Patient Measurements: Height: 5\' 9"  (175.3 cm) (Simultaneous filing. User may not have seen previous data.) Weight: 132 lb (59.9 kg) (Simultaneous filing. User may not have seen previous data.) IBW/kg (Calculated) : 70.7 TPN AdjBW (KG): 59.9 Body mass index is 19.49 kg/m.   Insulin requirements in the past 24 hours:  7 units  Assessment:  [name] who underwent a complex thoracic aneurysm repair with stent placement on 10/11. The patient is noted to have an ileus post-op and pharmacy has been consulted to start TPN for nutrition.  PICC placed ~7PM on 10/18; IV team had told pharmacist would not be placed until today.  GI: Post-op ileus - very minimal intake charted 10/13-10/14 (clears), so essentially NPO for >7d. NGT 1100mL, to remain 2nd high output.  Abd less distended. (+) bowel sounds & flatus, (-)N/V. Feels like needs to have BM- suppository 10/19, LBM 10/11.  Remains NPO.   10/18 d/w MD: despite impending Clinimix shortage & given unlikely diet resumption in the next 48 hours - wants to continue with TPN start.    Endo: Hx DM. CBGs/24h: 116-150. Currently on SSI q4h + tradjenta Lytes: Na 143, K 3.6- K runs x 3, Mg & Phos wnl - on NS + 20 mEq at 75 cc/hr Renal: SCr 1.33 << 1.44, CrCl~40 ml/min Pulm: 94%/3L-[place] Cards: Hx Afib - full dose Enox + Warf. BP wnl-elevated, HR brady-wnl. On norvasc, atorva, clonidine, losartan, lopressor, fish oil, propafenone Hepatobil: AST/ALT mildly elevated on 10/16, Tbili wnl Neuro: A&Ox4, no c/o pain ID: Afebrile, WBC 11.9, on no abx  Best Practices: Enox + Warf TPN Access:  10/18 TPN start date: 10/19 >>  Nutritional Goals (per RD recommendation on 10/12): KCal: 1800-2000          Protein: 80-95g  Goal Rate:  Clinimix E 5/15 at 80 ml/hr and 20% lipids at 10 ml/hr will provide 100% of kcal and protein needs  Current  Nutrition: NPO  Plan:  Clinimix-E 5/15 at 8140ml/hr TPN labs in AM and qMon/Thurs Decr IVF to 5535ml/hr when TPN started F/U resolution of ileus and start of po intake Continue Sensitive SSI q4h No MVI due to Vitamin B3 allergy causing swelling   [name], PharmD Clinical Pharmacist [facility]

## 2016-09-02 NOTE — Progress Notes (Signed)
  [facility] Progress Note  Subjective  Feels like he needs to have a BM this am. Has not had one yet. Passing flatus.   Objective Vitals:   09/02/16 0500 09/02/16 0600  BP: (!) 158/53 (!) 158/69  Pulse: 70 77  Resp: (!) 22 (!) 24  Temp:      Intake/Output Summary (Last 24 hours) at 09/02/16 0726 Last data filed at 09/02/16 0600  Gross per 24 hour  Intake          2904.68 ml  Output             2325 ml  Net           579.68 ml   Audible coarse breath sounds consistent with upper respiratory mucus.  Abd less distended, non tender Incisions clean and intact.  Feet warm bilaterally  Assessment/Planning: 75 y.o. male is s/p: TEVAR with retroperitoneal exposure 8 Days Post-Op   -Ileus: No BM yet. Passing flatus.  250 NG output last night. Starting TPN today. Continue IVF.  -Leukocytosis: slightly higher today. 11.9.  -Anemia: Hgb remains stable. Follow.  -HTN: Remains on nitro gtt. Wean as tolerated.  Per night RN, HR intermittently dips into high 20s-30s for a few seconds and resolves. May need to hold lopressor.  -Incentive spirometry, flutter valve for upper respiratory secretions.  -PT eval today. Needs to mobilize.   Raymond GurneyKimberly A Trinh 09/02/2016 7:26 AM --  Laboratory CBC    Component Value Date/Time   WBC 11.9 (H) 09/02/2016 0420   HGB 7.3 (L) 09/02/2016 0420   HCT 21.8 (L) 09/02/2016 0420   PLT 189 09/02/2016 0420    BMET    Component Value Date/Time   NA 143 09/02/2016 0420   K 3.6 09/02/2016 0420   CL 118 (H) 09/02/2016 0420   CO2 20 (L) 09/02/2016 0420   GLUCOSE 122 (H) 09/02/2016 0420   BUN 25 (H) 09/02/2016 0420   CREATININE 1.33 (H) 09/02/2016 0420   CALCIUM 7.8 (L) 09/02/2016 0420   GFRNONAA 51 (L) 09/02/2016 0420   GFRAA 59 (L) 09/02/2016 0420    COAG Lab Results  Component Value Date   INR 1.27 09/01/2016   INR 1.28 08/31/2016   INR 1.23 08/30/2016   No results found for: PTT  Antibiotics Anti-infectives    Start     Dose/Rate Route Frequency Ordered Stop   08/26/16 1000  levofloxacin (LEVAQUIN) tablet 750 mg  Status:  Discontinued     750 mg Oral Daily 08/28/2016 1624 08/26/16 0809   08/31/2016 1630  cefUROXime (ZINACEF) 1.5 g in dextrose 5 % 50 mL IVPB     1.5 g 100 mL/hr over 30 Minutes Intravenous Every 12 hours 08/28/2016 1624 08/26/16 0430   08/27/2016 0705  dextrose 5 % with cefUROXime (ZINACEF) ADS Med    Comments:  Scronce, Trina   : cabinet override      09/11/2016 0705 09/08/2016 0842   08/19/2016 0604  cefUROXime (ZINACEF) 1.5 g in dextrose 5 % 50 mL IVPB     1.5 g 100 mL/hr over 30 Minutes Intravenous 30 min pre-op 09/08/2016 0604 09/01/2016 0842       Maris BergerKimberly Trinh, PA-C [facility] Office: [phone] Pager: [phone] 09/02/2016 7:26 AM  Agree with the above Abdomen much less distended with NG tube decompression.  He states he passed gas this am.  Will monitor NG output and leave inplace for now ON IV nutrition WBC stable, afebrile

## 2016-09-03 ENCOUNTER — Inpatient Hospital Stay (HOSPITAL_COMMUNITY): Payer: Medicare Other

## 2016-09-03 LAB — TYPE AND SCREEN
ABO/RH(D): A POS
Antibody Screen: NEGATIVE
Donor AG Type: NEGATIVE
Unit division: 0

## 2016-09-03 LAB — COMPREHENSIVE METABOLIC PANEL
ALK PHOS: 55 U/L (ref 38–126)
ALT: 34 U/L (ref 17–63)
ALT: 37 U/L (ref 17–63)
ANION GAP: 6 (ref 5–15)
ANION GAP: 6 (ref 5–15)
AST: 27 U/L (ref 15–41)
AST: 31 U/L (ref 15–41)
Albumin: 2.1 g/dL — ABNORMAL LOW (ref 3.5–5.0)
Albumin: 2.4 g/dL — ABNORMAL LOW (ref 3.5–5.0)
Alkaline Phosphatase: 50 U/L (ref 38–126)
BILIRUBIN TOTAL: 1.2 mg/dL (ref 0.3–1.2)
BUN: 17 mg/dL (ref 6–20)
BUN: 19 mg/dL (ref 6–20)
CALCIUM: 8.2 mg/dL — AB (ref 8.9–10.3)
CHLORIDE: 113 mmol/L — AB (ref 101–111)
CO2: 19 mmol/L — AB (ref 22–32)
CO2: 20 mmol/L — ABNORMAL LOW (ref 22–32)
CREATININE: 1.12 mg/dL (ref 0.61–1.24)
CREATININE: 1.1 mg/dL (ref 0.61–1.24)
Calcium: 7.9 mg/dL — ABNORMAL LOW (ref 8.9–10.3)
Chloride: 115 mmol/L — ABNORMAL HIGH (ref 101–111)
Glucose, Bld: 529 mg/dL (ref 65–99)
Glucose, Bld: 189 mg/dL — ABNORMAL HIGH (ref 65–99)
Potassium: 4.1 mmol/L (ref 3.5–5.1)
Potassium: 3.3 mmol/L — ABNORMAL LOW (ref 3.5–5.1)
SODIUM: 138 mmol/L (ref 135–145)
Sodium: 141 mmol/L (ref 135–145)
TOTAL PROTEIN: 6 g/dL — AB (ref 6.5–8.1)
Total Bilirubin: 1 mg/dL (ref 0.3–1.2)
Total Protein: 5.3 g/dL — ABNORMAL LOW (ref 6.5–8.1)

## 2016-09-03 LAB — URINALYSIS W MICROSCOPIC (NOT AT ARMC)
BILIRUBIN URINE: NEGATIVE
GLUCOSE, UA: NEGATIVE mg/dL
HGB URINE DIPSTICK: NEGATIVE
KETONES UR: NEGATIVE mg/dL
LEUKOCYTES UA: NEGATIVE
NITRITE: NEGATIVE
PROTEIN: 30 mg/dL — AB
RBC / HPF: NONE SEEN RBC/hpf (ref 0–5)
Specific Gravity, Urine: 1.012 (ref 1.005–1.030)
pH: 5.5 (ref 5.0–8.0)

## 2016-09-03 LAB — DIFFERENTIAL
Basophils Absolute: 0 10*3/uL (ref 0.0–0.1)
Basophils Relative: 0 %
Eosinophils Absolute: 0.2 10*3/uL (ref 0.0–0.7)
Eosinophils Relative: 1 %
Lymphocytes Relative: 11 %
Lymphs Abs: 1.7 10*3/uL (ref 0.7–4.0)
Monocytes Absolute: 1.2 10*3/uL — ABNORMAL HIGH (ref 0.1–1.0)
Monocytes Relative: 8 %
Neutro Abs: 12.2 10*3/uL — ABNORMAL HIGH (ref 1.7–7.7)
Neutrophils Relative %: 80 %

## 2016-09-03 LAB — GLUCOSE, CAPILLARY
GLUCOSE-CAPILLARY: 181 mg/dL — AB (ref 65–99)
GLUCOSE-CAPILLARY: 179 mg/dL — AB (ref 65–99)
GLUCOSE-CAPILLARY: 239 mg/dL — AB (ref 65–99)
Glucose-Capillary: 181 mg/dL — ABNORMAL HIGH (ref 65–99)
Glucose-Capillary: 192 mg/dL — ABNORMAL HIGH (ref 65–99)
Glucose-Capillary: 209 mg/dL — ABNORMAL HIGH (ref 65–99)
Glucose-Capillary: 268 mg/dL — ABNORMAL HIGH (ref 65–99)

## 2016-09-03 LAB — PHOSPHORUS
PHOSPHORUS: 3.2 mg/dL (ref 2.5–4.6)
Phosphorus: 1.4 mg/dL — ABNORMAL LOW (ref 2.5–4.6)

## 2016-09-03 LAB — CBC
HCT: 27.5 % — ABNORMAL LOW (ref 39.0–52.0)
Hemoglobin: 9.1 g/dL — ABNORMAL LOW (ref 13.0–17.0)
MCH: 31.5 pg (ref 26.0–34.0)
MCHC: 33.1 g/dL (ref 30.0–36.0)
MCV: 95.2 fL (ref 78.0–100.0)
Platelets: 203 10*3/uL (ref 150–400)
RBC: 2.89 MIL/uL — ABNORMAL LOW (ref 4.22–5.81)
RDW: 16.8 % — ABNORMAL HIGH (ref 11.5–15.5)
WBC: 15.3 10*3/uL — ABNORMAL HIGH (ref 4.0–10.5)

## 2016-09-03 LAB — MAGNESIUM
MAGNESIUM: 1.8 mg/dL (ref 1.7–2.4)
MAGNESIUM: 1.6 mg/dL — AB (ref 1.7–2.4)

## 2016-09-03 LAB — PREALBUMIN: PREALBUMIN: 5.6 mg/dL — AB (ref 18–38)

## 2016-09-03 LAB — TRIGLYCERIDES: Triglycerides: 190 mg/dL — ABNORMAL HIGH (ref <150)

## 2016-09-03 MED ORDER — PIPERACILLIN-TAZOBACTAM 3.375 G IVPB 30 MIN
3.3750 g | INTRAVENOUS | Status: AC
  Administered 2016-09-04: 3.375 g via INTRAVENOUS
  Filled 2016-09-03: qty 50

## 2016-09-03 MED ORDER — LOSARTAN POTASSIUM 50 MG PO TABS
50.0000 mg | ORAL_TABLET | Freq: Every day | ORAL | Status: DC
  Administered 2016-09-03 – 2016-09-08 (×3): 50 mg via ORAL
  Filled 2016-09-03 (×3): qty 1

## 2016-09-03 MED ORDER — AMLODIPINE BESYLATE 5 MG PO TABS
5.0000 mg | ORAL_TABLET | Freq: Every day | ORAL | Status: DC
  Administered 2016-09-03: 5 mg via ORAL
  Filled 2016-09-03: qty 1

## 2016-09-03 MED ORDER — POTASSIUM CHLORIDE 10 MEQ/50ML IV SOLN
10.0000 meq | INTRAVENOUS | Status: DC | PRN
  Administered 2016-09-04 – 2016-09-05 (×2): 10 meq via INTRAVENOUS
  Filled 2016-09-03 (×3): qty 50

## 2016-09-03 MED ORDER — FUROSEMIDE 10 MG/ML IJ SOLN
20.0000 mg | Freq: Once | INTRAMUSCULAR | Status: AC
  Administered 2016-09-03: 20 mg via INTRAVENOUS
  Filled 2016-09-03: qty 2

## 2016-09-03 MED ORDER — TRACE MINERALS CR-CU-MN-SE-ZN 10-1000-500-60 MCG/ML IV SOLN
INTRAVENOUS | Status: AC
  Administered 2016-09-03: 17:00:00 via INTRAVENOUS
  Filled 2016-09-03: qty 1560

## 2016-09-03 MED ORDER — CLONIDINE HCL 0.3 MG/24HR TD PTWK
0.3000 mg | MEDICATED_PATCH | TRANSDERMAL | Status: DC
  Administered 2016-09-03: 0.3 mg via TRANSDERMAL
  Filled 2016-09-03: qty 1

## 2016-09-03 MED ORDER — VANCOMYCIN HCL 500 MG IV SOLR
500.0000 mg | Freq: Two times a day (BID) | INTRAVENOUS | Status: DC
  Administered 2016-09-04 (×2): 500 mg via INTRAVENOUS
  Filled 2016-09-03 (×2): qty 500

## 2016-09-03 MED ORDER — MAGNESIUM SULFATE 2 GM/50ML IV SOLN
2.0000 g | Freq: Once | INTRAVENOUS | Status: AC
  Administered 2016-09-03: 2 g via INTRAVENOUS
  Filled 2016-09-03: qty 50

## 2016-09-03 MED ORDER — FAT EMULSION 20 % IV EMUL
240.0000 mL | INTRAVENOUS | Status: AC
  Administered 2016-09-03: 240 mL via INTRAVENOUS
  Filled 2016-09-03: qty 250

## 2016-09-03 MED ORDER — CLONIDINE HCL 0.3 MG/24HR TD PTWK: 0.3000 mg | MEDICATED_PATCH | TRANSDERMAL | Status: DC

## 2016-09-03 MED ORDER — AMLODIPINE BESYLATE 10 MG PO TABS
10.0000 mg | ORAL_TABLET | Freq: Every day | ORAL | Status: DC
  Administered 2016-09-08 – 2016-09-09 (×2): 10 mg via ORAL
  Filled 2016-09-03 (×2): qty 1

## 2016-09-03 MED ORDER — PIPERACILLIN-TAZOBACTAM 3.375 G IVPB
3.3750 g | Freq: Three times a day (TID) | INTRAVENOUS | Status: DC
  Filled 2016-09-03 (×3): qty 50

## 2016-09-03 NOTE — Progress Notes (Signed)
PRN 10 mg dose of hydralazine administered and nitro drip increased to 90 mcg/min. Will continue to monitor and recheck BP in one hour

## 2016-09-03 NOTE — Progress Notes (Signed)
PRN 2.5mg  dose of Lopressor given for BP of 183/68. Will recheck in one hour.   09/03/16 0248  Vitals  BP (!) 183/68  MAP (mmHg) 103  Pulse Rate 76  ECG Heart Rate 77  Resp (!) 23  Oxygen Therapy  SpO2 94 %

## 2016-09-03 NOTE — Progress Notes (Signed)
ANTICOAGULATION CONSULT NOTE - Follow Up Consult  Pharmacy Consult for Lovenox Indication: atrial fibrillation.  Assessment: [name] on Coumadin PTA for AFib, now s/p thoracic aortic endovascular stent graft on 10/12. Pt currently on full-dose enoxaparin, originally planned to restart warfarin but due to ileus this continues to be on hold. TPN was started for nutrition  INR stable at 1.2 (last dose of warfarin was 10/14). Hgb improved at 9.1 after 1 unti given 10/19. No bleeding issues noted.  Goal of Therapy:  INR 2-3 Anti-Xa level 0.6-1 units/ml 4hrs after LMWH dose given Monitor platelets by anticoagulation protocol: Yes   Plan:  -Continue Lovenox 60mg  [place] BID  -Continue to hold warfarin -Will resume daily INR once warfarin resumed  Allergies  Allergen Reactions  . Lopressor [Metoprolol Tartrate] Palpitations    LOPRESSOR bothers the patient.  . Niacin Swelling    FACIAL EDEMA    Patient Measurements: Height: 5\' 9"  (175.3 cm) (Simultaneous filing. User may not have seen previous data.) Weight: 132 lb (59.9 kg) (Simultaneous filing. User may not have seen previous data.) IBW/kg (Calculated) : 70.7 Heparin Dosing Weight: 59.9kg  Vital Signs: Temp: 98 F (36.7 C) (10/20 0636) Temp Source: Oral (10/20 0636) BP: 191/54 (10/20 0800) Pulse Rate: 71 (10/20 0800)  Labs:  Recent Labs  09/01/16 0335 09/02/16 0420 09/03/16 0500 09/03/16 0726  HGB 7.6* 7.3* 9.1*  --   HCT 22.4* 21.8* 27.5*  --   PLT 173 189 203  --   LABPROT 16.0*  --   --   --   INR 1.27  --   --   --   CREATININE 1.37* 1.33* 1.12 1.10    Estimated Creatinine Clearance: 49.2 mL/min (by C-G formula based on SCr of 1.1 mg/dL).   Sheppard CoilFrank Oceane Fosse PharmD., BCPS Clinical Pharmacist Pager [phone] 09/03/2016 8:39 AM

## 2016-09-03 NOTE — Progress Notes (Addendum)
Progress Note    09/03/2016 10:07 AM 9 Days Post-Op  Subjective:  Sleepy but awakes easily to answers questions and falls back to sleep.  Answers questions appropriately.  +BM yesterday; states he's been belching and denies much flatus.  Denies abdominal pain.  Afebrile HR  70's-90's NSR 160's-190's systolic 93% 2LO2NC  Gtts:  NTG  Vitals:   09/03/16 0636 09/03/16 0800  BP: (!) 192/70 (!) 191/54  Pulse: 90 71  Resp: (!) 24 19  Temp: 98 F (36.7 C)     Physical Exam: Cardiac:  regular Lungs:  Non labored Incisions:  Healing nicely Extremities:  Moving all extremities equally; +palpable PT pulses bilaterally Abdomen:  Soft, NT/ND; decreased BS Neuro:  Sleepy but awakes to voice; alert to place, person, DOB, year and president.  CBC    Component Value Date/Time   WBC 15.3 (H) 09/03/2016 0500   RBC 2.89 (L) 09/03/2016 0500   HGB 9.1 (L) 09/03/2016 0500   HCT 27.5 (L) 09/03/2016 0500   PLT 203 09/03/2016 0500   MCV 95.2 09/03/2016 0500   MCH 31.5 09/03/2016 0500   MCHC 33.1 09/03/2016 0500   RDW 16.8 (H) 09/03/2016 0500   LYMPHSABS 1.7 09/03/2016 0500   MONOABS 1.2 (H) 09/03/2016 0500   EOSABS 0.2 09/03/2016 0500   BASOSABS 0.0 09/03/2016 0500    BMET    Component Value Date/Time   NA 141 09/03/2016 0726   K 3.3 (L) 09/03/2016 0726   CL 115 (H) 09/03/2016 0726   CO2 20 (L) 09/03/2016 0726   GLUCOSE 189 (H) 09/03/2016 0726   BUN 19 09/03/2016 0726   CREATININE 1.10 09/03/2016 0726   CALCIUM 8.2 (L) 09/03/2016 0726   GFRNONAA >60 09/03/2016 0726   GFRAA >60 09/03/2016 0726    INR    Component Value Date/Time   INR 1.27 09/01/2016 0335     Intake/Output Summary (Last 24 hours) at 09/03/16 1007 Last data filed at 09/03/16 1610  Gross per 24 hour  Intake          3766.48 ml  Output             2975 ml  Net           791.48 ml     Assessment:  75 y.o. male is s/p:  TEVAR with retroperitoneal exposure  9 Days Post-Op  Plan: -pt had BM last  evening and abdomen with less distension.  Pt pulled NGT out this morning.   -elevated BP today-on nitro gtt, clonidine patch ordered and prn hydralazine.  -pt sleepy this am, however, did awake and answer questions and alert to person, place, year and president.  Moving all extremities equally.  Pt seen with Dr. Myra Gianotti this am.  Will wait on head CT scan after discussing with Dr. Myra Gianotti. -DVT prophylaxis:  SQ heparin -elevated WBC today, which is up from yesterday.  Pt is not receiving Abx -nutrition:  TPN   Doreatha Massed, PA-C [facility] [phone] 09/03/2016 10:07 AM  The patient pulled his NGT out this am.  We elected to give him a trial without it since he has had a stool and report flatus. He was lethargic this am but was appropriate. This afternoon, he had some right arm drift and slurred speech and confusion.  A CT scan of the head was done which showed no acute events.  His UA was clean.  I had a lengthy discussion with his wife and daughter.  Hopefully this is  post-operative delerium, given his lengthy hospital stay His WBC did increase today.  With his pulmonary history, I am concerned for early pneumonia.  His XRAY only showed pleural effusions.  I am going to start him on vanc and zosyn I will also give him a dose of lasix and minimize his fluids.  With his IV nutrition, he is getting a lot of extra volume HTN:  Increased norvasc to 10 mg  Wells Brabham

## 2016-09-03 NOTE — Progress Notes (Signed)
PARENTERAL NUTRITION CONSULT NOTE - FOLLOW UP  Pharmacy Consult for TPN Indication: Prolonged ileus  Allergies  Allergen Reactions  . Lopressor [Metoprolol Tartrate] Palpitations    LOPRESSOR bothers the patient.  . Niacin Swelling    FACIAL EDEMA    Patient Measurements: Height: 5\' 9"  (175.3 cm) (Simultaneous filing. User may not have seen previous data.) Weight: 132 lb (59.9 kg) (Simultaneous filing. User may not have seen previous data.) IBW/kg (Calculated) : 70.7 Adjusted Body Weight: 59.9 BMI 19.49  Vital Signs: Temp: 98 F (36.7 C) (10/20 0636) Temp Source: Oral (10/20 0636) BP: 192/70 (10/20 0636) Pulse Rate: 90 (10/20 0636) Intake/Output from previous day: 10/19 0701 - 10/20 0700 In: 3816.5 [I.V.:3141.5; Blood:335; NG/GT:90; IV Piggyback:250] Out: 3400 [Urine:2200; Emesis/NG output:1200] Intake/Output from this shift: No intake/output data recorded.  Labs:  Recent Labs  09/01/16 0335 09/02/16 0420  WBC 10.9* 11.9*  HGB 7.6* 7.3*  HCT 22.4* 21.8*  PLT 173 189  INR 1.27  --      Recent Labs  09/01/16 0335 09/02/16 0420 09/03/16 0500  NA 140 143 138  K 4.0 3.6 4.1  CL 114* 118* 113*  CO2 19* 20* 19*  GLUCOSE 173* 122* 529*  BUN 29* 25* 17  CREATININE 1.37* 1.33* 1.12  CALCIUM 8.1* 7.8* 7.9*  MG  --  1.8 1.8  PHOS  --  2.5 3.2  PROT  --  5.1* 5.3*  ALBUMIN  --  2.1* 2.1*  AST  --  27 27  ALT  --  41 34  ALKPHOS  --  42 50  BILITOT  --  0.9 1.0  PREALBUMIN  --  5.1* 5.6*  TRIG  --  66 190*   Estimated Creatinine Clearance: 48.3 mL/min (by C-G formula based on SCr of 1.12 mg/dL).    Recent Labs  09/02/16 1955 09/03/16 0021 09/03/16 0440  GLUCAP 159* 181* 192*    Medications:  Scheduled:  . chlorhexidine  15 mL Mouth Rinse BID  . cloNIDine  0.1 mg Transdermal Weekly  . enoxaparin (LOVENOX) injection  60 mg Subcutaneous Q12H  . famotidine (PEPCID) IV  20 mg Intravenous Q12H  . insulin aspart  0-9 Units Subcutaneous Q4H  . mouth  rinse  15 mL Mouth Rinse q12n4p  . metoCLOPramide (REGLAN) injection  10 mg Intravenous Q6H  . mometasone-formoterol  2 puff Inhalation BID  . propafenone  225 mg Oral BID  . sodium chloride flush  10-40 mL Intracatheter Q12H  . 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 The patient is noted to have an ileus post-op and pharmacy has been consulted to start TPN for nutrition.    RU:[id number]:Post-op ileus - very minimal intake charted 10/13-10/14 (clears), so essentially NPO for >7d. NGT 1200mL.  Abd less distended, pt pulled out NG this AM during episode confusion; replace if develop N/V. (+)BM, (+)belching.   Decr bowel sounds, (-) flatus, (-)N/V. BM x 2 10/19.  Remains NPO.   10/18 d/w MD: despite impending Clinimix shortage & given unlikely diet resumption in the next 48 hours - wants to continue with TPN start.    Endo:Hx DM. CBGs/24h: 181-192 on TPN. Currently on SSI q4h + tradjenta Lytes: K 4.1- improved after K runs, Mg 1.8 & Phos 3.2 - on NS + 20 mEq at 75 cc/hr Renal:SCr 1.1,  CrCl~50 ml/min.  UOP , I/O 417 Pulm:1.5L-[place] Cards:Hx Afib - full dose Enox + Warf. BP wnl-elevated & receiving prn Hydralazine/Metoprolol o/n, HR brady-tachy. On norvasc, atorva, clonidine, losartan, lopressor, fish oil, propafenone Hepatobil:AST/ALT mildly elevated on 10/16, Tbili wnl.  TG 190, Palb 5.6- indicating nutritional depletion  Neuro:A&Ox4, no c/o pain RU:[id number], WBC 15.3- trending up, on no abx  Best Practices:Enox + Warf TPN Access: 10/18 TPN start date:10/19 >>  Nutritional Goals (per RD recommendation on 10/12): KCal:1800-2000 Protein: 80-95g  Goal Rate:  Clinimix E 5/20 at 65 ml/hr and 20% lipids at 10 ml/hr- providing 1853kcal and 78g protein  Current Nutrition: NPO  Plan: Change to  Clinimix-E 5/20 (d/t shortage) at 81ml/hr and Intralipid 31ml/hr, meeting 100% caloric goals and 98% protein goals Add insulin 10 units to TPN Mg 2g IV x 1 BMet, Mg in AM TPN labs in AM and qMon/Thurs Decr IVF to kvo  F/U resolution of ileus and start of po intake Continue Sensitive SSI q4h Add Trace elements to TPN, no MVI d/t facial swelling with Niacin   Marisue Humble, PharmD Clinical Pharmacist [facility]

## 2016-09-03 NOTE — Progress Notes (Signed)
Pharmacy Antibiotic Note  [name] is a 75 y.o. male admitted on 08/29/2016 for thoracic stenting on 10/11. Pt with leukocytosis and AMS. Pharmacy has been consulted for Vancomycin and Zosyn dosing for r/o PNA.  Plan: Zosyn 3.375gm IV now over 30 min then 3.375gm IV q8h - subsequent doses over 4 hours Vancomycin 500mg  IV q12h Will f/u micro data, renal function, and pt's clinical condition Vanc trough prn  Height: 5\' 9"  (175.3 cm) (Simultaneous filing. User may not have seen previous data.) Weight: 132 lb (59.9 kg) (Simultaneous filing. User may not have seen previous data.) IBW/kg (Calculated) : 70.7  Temp (24hrs), Avg:98.2 F (36.8 C), Min:98 F (36.7 C), Max:98.9 F (37.2 C)   Recent Labs Lab 08/30/16 0345 08/31/16 0500 09/01/16 0335 09/02/16 0420 09/03/16 0500 09/03/16 0726  WBC 11.6* 11.1* 10.9* 11.9* 15.3*  --   CREATININE 1.49* 1.44* 1.37* 1.33* 1.12 1.10    Estimated Creatinine Clearance: 49.2 mL/min (by C-G formula based on SCr of 1.1 mg/dL).    Allergies  Allergen Reactions  . Lopressor [Metoprolol Tartrate] Palpitations    LOPRESSOR bothers the patient.  . Niacin Swelling    FACIAL EDEMA    Antimicrobials this admission: 10/21 Vanc >>  10/21 zosyn >>   Dose adjustments this admission: n/a  Microbiology results:  Thank you for allowing pharmacy to be a part of this patient's care.  Christoper Fabianaron Mliss Wedin, PharmD, BCPS Clinical pharmacist, pager [phone] 09/03/2016 11:48 PM

## 2016-09-03 NOTE — Progress Notes (Signed)
Notified sam rhyne PA regarding pt removing NG tube, will leave out and continue to monitor.

## 2016-09-03 NOTE — Progress Notes (Addendum)
Called via nursing as patients family is very concerned with the patient's change in status.  They state he has been somnolent most of the day.  He has been having difficult speaking and they feel his speech is slurred.  They are concerned he is having or has had a stroke.  PE:  Gen: no apparent distress, appears tired Heart: RRR, tachy Lungs: CTA bilaterally Neuro:  AAO x 3, equal strength on both upper and lower extremities, speech is diminished, not slurred, but improved after moistening mouth... However, he is unable to keep right arm elevated when asked  A/P:  Patients neuro exam is normal except for inability to keep right arm elevated when asked.  He could have suffered a small stroke earlier today.  Per wife patient was confused and agitated this morning and was not making sense.  May benefit from head CT and Neurology consult.  Will discuss with Dr. Cornelius Moraswen and Dr. Myra GianottiBrabham as vascular surgery is primary  Also patient has been incontinence today.  With leukocytosis could also have UTI which can attribute to confusion.  Will check UA   Tajae Rybicki PA-C

## 2016-09-03 NOTE — Progress Notes (Signed)
      [address]       [place] [id number]             [phone]      9 Days Post-Op Procedure(s) (LRB): THORACIC AORTIC ENDOVASCULAR STENT GRAFT WITH RETROPERITONEAL EXPOSURE (N/A)   Subjective:  [name] has no new complaints.  Per nursing, patient pulled NG tube out this morning during episode of confusion and agitation.  Objective: Vital signs in last 24 hours: Temp:  [98 F (36.7 C)-99.6 F (37.6 C)] 98 F (36.7 C) (10/20 0636) Pulse Rate:  [55-147] 71 (10/20 0800) Cardiac Rhythm: Normal sinus rhythm (10/20 0700) Resp:  [15-27] 19 (10/20 0800) BP: (158-199)/(54-90) 191/54 (10/20 0800) SpO2:  [90 %-96 %] 93 % (10/20 0800)   Intake/Output from previous day: 10/19 0701 - 10/20 0700 In: 3816.5 [I.V.:3141.5; Blood:335; NG/GT:90; IV Piggyback:250] Out: 3400 [Urine:2200; Emesis/NG output:1200]  General appearance: alert, cooperative and no distress Heart: regular rate and rhythm Lungs: clear to auscultation bilaterally Abdomen: soft, non-tender; bowel sounds normal; no masses,  no organomegaly Extremities: extremities normal, atraumatic, no cyanosis or edema Wound: clean and dry  Lab Results:  Recent Labs  09/02/16 0420 09/03/16 0500  WBC 11.9* 15.3*  HGB 7.3* 9.1*  HCT 21.8* 27.5*  PLT 189 203   BMET:  Recent Labs  09/03/16 0500 09/03/16 0726  NA 138 141  K 4.1 3.3*  CL 113* 115*  CO2 19* 20*  GLUCOSE 529* 189*  BUN 17 19  CREATININE 1.12 1.10  CALCIUM 7.9* 8.2*    PT/INR:  Recent Labs  09/01/16 0335  LABPROT 16.0*  INR 1.27   ABG    Component Value Date/Time   PHART 7.406 08/23/2016 1714   HCO3 23.7 08/17/2016 1714   TCO2 25 08/15/2016 1714   ACIDBASEDEF 1.0 08/30/2016 1714   O2SAT 94.0 08/28/2016 1714   CBG (last 3)   Recent Labs  09/03/16 0021 09/03/16 0440 09/03/16 0735  GLUCAP 181* 192* 181*    Assessment/Plan: S/P Procedure(s) (LRB): THORACIC AORTIC ENDOVASCULAR STENT GRAFT WITH RETROPERITONEAL  EXPOSURE (N/A)  1. CV- NSR, HTN SBP up the 190s- clonidine patch added, NTG drip running... Will increase PRN Hydralazine to 20 mg 2. Pulm- no acute issues, wean oxygen as tolerated 3. Renal- Hypokalemia, will treat with IV runs 4. GI- patient pulled NG tube out... Will leave out for now unless vomiting and abdominal distention returns, continue TPN, has moved bowels once, continue bowel agents 5. Diet- will start ice chips  6. Dispo- BP control biggest issue this morning, medication adjustments made... Watch for N/V will need to replace NG tube if develops   LOS: 9 days    [name] 09/03/2016

## 2016-09-03 NOTE — Progress Notes (Signed)
   09/03/16 1545  Vitals  BP (!) 183/66  MAP (mmHg) 99  Pulse Rate 100  ECG Heart Rate 92  Resp 19  Oxygen Therapy  SpO2 94 %  hydralazine given

## 2016-09-03 NOTE — Progress Notes (Signed)
Patient's BP after prn dose of 2.5mg  of Lopressor.    09/03/16 0200  Vitals  BP (!) 186/68  MAP (mmHg) 101  Pulse Rate 87  ECG Heart Rate 88  Resp (!) 21  Oxygen Therapy  SpO2 96 %

## 2016-09-03 NOTE — Progress Notes (Signed)
   09/03/16 0800  Vitals  BP (!) 191/54  MAP (mmHg) 95  Pulse Rate 71  ECG Heart Rate 69  Resp 19  Oxygen Therapy  SpO2 93 %  hydralzine given

## 2016-09-03 NOTE — Progress Notes (Addendum)
PRN dose of 2.5mg  of Lopressor given for a BP of 181/66. Will recheck on 1 hour.   09/03/16 0046  Vitals  BP (!) 181/66  MAP (mmHg) 101  BP Location Left Arm  BP Method Automatic  Patient Position (if appropriate) Sitting  Pulse Rate 92  Pulse Rate Source Monitor  ECG Heart Rate 93

## 2016-09-04 ENCOUNTER — Inpatient Hospital Stay (HOSPITAL_COMMUNITY): Payer: Medicare Other

## 2016-09-04 DIAGNOSIS — G9340 Encephalopathy, unspecified: Secondary | ICD-10-CM

## 2016-09-04 LAB — BLOOD GAS, ARTERIAL
ACID-BASE DEFICIT: 1 mmol/L (ref 0.0–2.0)
BICARBONATE: 22.1 mmol/L (ref 20.0–28.0)
DRAWN BY: 398991
O2 Content: 4 L/min
O2 Saturation: 91.6 %
PCO2 ART: 31.5 mmHg — AB (ref 32.0–48.0)
PH ART: 7.465 — AB (ref 7.350–7.450)
Patient temperature: 100
pO2, Arterial: 64.2 mmHg — ABNORMAL LOW (ref 83.0–108.0)

## 2016-09-04 LAB — GLUCOSE, CAPILLARY
GLUCOSE-CAPILLARY: 189 mg/dL — AB (ref 65–99)
GLUCOSE-CAPILLARY: 168 mg/dL — AB (ref 65–99)
Glucose-Capillary: 272 mg/dL — ABNORMAL HIGH (ref 65–99)
Glucose-Capillary: 330 mg/dL — ABNORMAL HIGH (ref 65–99)
Glucose-Capillary: 159 mg/dL — ABNORMAL HIGH (ref 65–99)
Glucose-Capillary: 189 mg/dL — ABNORMAL HIGH (ref 65–99)

## 2016-09-04 LAB — CBC WITH DIFFERENTIAL/PLATELET
BASOS PCT: 0 %
Basophils Absolute: 0 10*3/uL (ref 0.0–0.1)
EOS PCT: 1 %
Eosinophils Absolute: 0.2 10*3/uL (ref 0.0–0.7)
HCT: 31 % — ABNORMAL LOW (ref 39.0–52.0)
Hemoglobin: 10.4 g/dL — ABNORMAL LOW (ref 13.0–17.0)
LYMPHS ABS: 1.9 10*3/uL (ref 0.7–4.0)
Lymphocytes Relative: 11 %
MCH: 30.9 pg (ref 26.0–34.0)
MCHC: 33.5 g/dL (ref 30.0–36.0)
MCV: 92 fL (ref 78.0–100.0)
MONO ABS: 1.7 10*3/uL — AB (ref 0.1–1.0)
Monocytes Relative: 10 %
Neutro Abs: 13.5 10*3/uL — ABNORMAL HIGH (ref 1.7–7.7)
Neutrophils Relative %: 78 %
PLATELETS: 203 10*3/uL (ref 150–400)
RBC: 3.37 MIL/uL — ABNORMAL LOW (ref 4.22–5.81)
RDW: 16.3 % — AB (ref 11.5–15.5)
WBC: 17.3 10*3/uL — ABNORMAL HIGH (ref 4.0–10.5)

## 2016-09-04 LAB — BASIC METABOLIC PANEL
Anion gap: 6 (ref 5–15)
BUN: 16 mg/dL (ref 6–20)
CALCIUM: 8.1 mg/dL — AB (ref 8.9–10.3)
CO2: 22 mmol/L (ref 22–32)
CREATININE: 1.09 mg/dL (ref 0.61–1.24)
Chloride: 112 mmol/L — ABNORMAL HIGH (ref 101–111)
GFR calc Af Amer: >60 mL/min (ref >60)
GLUCOSE: 235 mg/dL — AB (ref 65–99)
Potassium: 3.1 mmol/L — ABNORMAL LOW (ref 3.5–5.1)
Sodium: 140 mmol/L (ref 135–145)

## 2016-09-04 LAB — PROCALCITONIN: PROCALCITONIN: 0.38 ng/mL

## 2016-09-04 LAB — TROPONIN I: TROPONIN I: 0.04 ng/mL — AB (ref <0.03)

## 2016-09-04 LAB — LACTIC ACID, PLASMA: Lactic Acid, Venous: 2.5 mmol/L (ref 0.5–1.9)

## 2016-09-04 MED ORDER — NICARDIPINE HCL IN NACL 20-0.86 MG/200ML-% IV SOLN
3.0000 mg/h | INTRAVENOUS | Status: DC
  Administered 2016-09-04: 10 mg/h via INTRAVENOUS
  Administered 2016-09-04: 15 mg/h via INTRAVENOUS
  Administered 2016-09-04 (×2): 5 mg/h via INTRAVENOUS
  Administered 2016-09-04 (×4): 15 mg/h via INTRAVENOUS
  Filled 2016-09-04 (×14): qty 200

## 2016-09-04 MED ORDER — INSULIN ASPART 100 UNIT/ML ~~LOC~~ SOLN
0.0000 [IU] | SUBCUTANEOUS | Status: DC
  Administered 2016-09-04: 15 [IU] via SUBCUTANEOUS
  Administered 2016-09-04 – 2016-09-05 (×5): 3 [IU] via SUBCUTANEOUS
  Administered 2016-09-05: 5 [IU] via SUBCUTANEOUS
  Administered 2016-09-05: 3 [IU] via SUBCUTANEOUS
  Administered 2016-09-06: 2 [IU] via SUBCUTANEOUS
  Administered 2016-09-06: 5 [IU] via SUBCUTANEOUS
  Administered 2016-09-06 – 2016-09-07 (×4): 3 [IU] via SUBCUTANEOUS
  Administered 2016-09-07 – 2016-09-09 (×4): 2 [IU] via SUBCUTANEOUS
  Administered 2016-09-09 (×3): 3 [IU] via SUBCUTANEOUS

## 2016-09-04 MED ORDER — METOPROLOL TARTRATE 5 MG/5ML IV SOLN
10.0000 mg | INTRAVENOUS | Status: DC | PRN
Start: 2016-09-04 — End: 2016-09-07
  Administered 2016-09-04: 5 mg via INTRAVENOUS
  Administered 2016-09-06: 10 mg via INTRAVENOUS
  Filled 2016-09-04 (×3): qty 10

## 2016-09-04 MED ORDER — NALOXONE HCL 0.4 MG/ML IJ SOLN
INTRAMUSCULAR | Status: AC
  Administered 2016-09-04: 0.4 mg
  Filled 2016-09-04: qty 1

## 2016-09-04 MED ORDER — METOPROLOL TARTRATE 5 MG/5ML IV SOLN: 10.0000 mg | Freq: Four times a day (QID) | INTRAVENOUS | Status: DC

## 2016-09-04 MED ORDER — HYDRALAZINE HCL 20 MG/ML IJ SOLN: 10.0000 mg | Freq: Four times a day (QID) | INTRAMUSCULAR | Status: DC

## 2016-09-04 MED ORDER — INSULIN REGULAR HUMAN 100 UNIT/ML IJ SOLN
INTRAMUSCULAR | Status: AC
  Administered 2016-09-04: 18:00:00 via INTRAVENOUS
  Filled 2016-09-04: qty 1560

## 2016-09-04 MED ORDER — POTASSIUM CHLORIDE 10 MEQ/50ML IV SOLN
10.0000 meq | INTRAVENOUS | Status: AC
  Administered 2016-09-04 (×3): 10 meq via INTRAVENOUS
  Filled 2016-09-04: qty 50

## 2016-09-04 MED ORDER — FUROSEMIDE 10 MG/ML IJ SOLN
INTRAMUSCULAR | Status: AC
  Administered 2016-09-04: 20 mg
  Filled 2016-09-04: qty 2

## 2016-09-04 MED ORDER — FAT EMULSION 20 % IV EMUL
240.0000 mL | INTRAVENOUS | Status: AC
  Administered 2016-09-04: 240 mL via INTRAVENOUS
  Filled 2016-09-04: qty 250

## 2016-09-04 MED ORDER — VANCOMYCIN HCL 500 MG IV SOLR
500.0000 mg | Freq: Two times a day (BID) | INTRAVENOUS | Status: DC
  Administered 2016-09-05 – 2016-09-07 (×5): 500 mg via INTRAVENOUS
  Filled 2016-09-04 (×9): qty 500

## 2016-09-04 MED ORDER — HYDRALAZINE HCL 20 MG/ML IJ SOLN
20.0000 mg | INTRAMUSCULAR | Status: DC | PRN
  Administered 2016-09-05: 20 mg via INTRAVENOUS
  Administered 2016-09-08 (×3): 10 mg via INTRAVENOUS
  Administered 2016-09-09 (×2): 20 mg via INTRAVENOUS
  Filled 2016-09-04 (×5): qty 1

## 2016-09-04 MED ORDER — PIPERACILLIN-TAZOBACTAM 3.375 G IVPB
3.3750 g | Freq: Three times a day (TID) | INTRAVENOUS | Status: DC
  Administered 2016-09-04 – 2016-09-09 (×16): 3.375 g via INTRAVENOUS
  Filled 2016-09-04 (×20): qty 50

## 2016-09-04 NOTE — Progress Notes (Addendum)
Subjective  -   Patient was unarousable this morning and a rapid response was called.  He was given Narcan and lab work was checked.  He has now gotten to the point where he will open his eyes to verbal stimuli but appears to be very somnolent.  His speech is somewhat garbled.  His wife is at the bedside.   Physical Exam:  Appears confused and deeply sedated.  He has no focal deficits. His abdomen remained soft. Extremities are well perfused.        Assessment/Plan:    Acute mental status changes: The patient had a head CT yesterday which was negative for acute events.  He also had a normal urinalysis.  I had initially thought that this was worsening postoperative delirium, however I have asked neurology to evaluate the patient for a different perspective.  They performed an EEG which was negative for seizures.  MRI of the brain is pending.  Pulmonary: The patient does appear to be volume overloaded as evidence by pleural effusions and somewhat labored breathing.  Additional dose of Lasix is given this morning.  His fluids are being minimized, however he is still receiving a fair amount of volume from his TPN.  He will be transferred to the ICU.  I have spoken was CCM will also assist in his care.  Initial lab work from his rapid response showed a normal PCO2 on blood gas.  His white count remains elevated.  It has increased to 17,000 today.  He was started on vancomycin and Zosyn yesterday for presumed pneumonia, given his chronic pulmonary deconditioning.  Hypertension: Norvasc increased yesterday.  I will try to wean off of his nitroglycerin as I do not think it is helping.  His hydralazine was also increased.  Wife was updated at the bedside  Durene Cal 09/04/2016 12:44 PM --  Vitals:   09/04/16 1200 09/04/16 1230  BP: (!) 149/60 (!) 168/61  Pulse: 98 (!) 102  Resp: (!) 21 (!) 23  Temp:      Intake/Output Summary (Last 24 hours) at 09/04/16 1244 Last data  filed at 09/04/16 1000  Gross per 24 hour  Intake          3407.95 ml  Output             3225 ml  Net           182.95 ml     Laboratory CBC    Component Value Date/Time   WBC 17.3 (H) 09/04/2016 1150   HGB 10.4 (L) 09/04/2016 1150   HCT 31.0 (L) 09/04/2016 1150   PLT 203 09/04/2016 1150    BMET    Component Value Date/Time   NA 140 09/04/2016 0357   K 3.1 (L) 09/04/2016 0357   CL 112 (H) 09/04/2016 0357   CO2 22 09/04/2016 0357   GLUCOSE 235 (H) 09/04/2016 0357   BUN 16 09/04/2016 0357   CREATININE 1.09 09/04/2016 0357   CALCIUM 8.1 (L) 09/04/2016 0357   GFRNONAA >60 09/04/2016 0357   GFRAA >60 09/04/2016 0357    COAG Lab Results  Component Value Date   INR 1.27 09/01/2016   INR 1.28 08/31/2016   INR 1.23 08/30/2016   No results found for: PTT  Antibiotics Anti-infectives    Start     Dose/Rate Route Frequency Ordered Stop   09/04/16 0800  piperacillin-tazobactam (ZOSYN) IVPB 3.375 g     3.375 g 12.5 mL/hr over 240 Minutes Intravenous Every 8 hours  09/03/16 2355     09/04/16 0000  vancomycin (VANCOCIN) 500 mg in sodium chloride 0.9 % 100 mL IVPB     500 mg 100 mL/hr over 60 Minutes Intravenous Every 12 hours 09/03/16 2355     09/04/16 0000  piperacillin-tazobactam (ZOSYN) IVPB 3.375 g     3.375 g 100 mL/hr over 30 Minutes Intravenous STAT 09/03/16 2355 09/04/16 0142   08/26/16 1000  levofloxacin (LEVAQUIN) tablet 750 mg  Status:  Discontinued     750 mg Oral Daily 08/17/2016 1624 08/26/16 0809   09/05/2016 1630  cefUROXime (ZINACEF) 1.5 g in dextrose 5 % 50 mL IVPB     1.5 g 100 mL/hr over 30 Minutes Intravenous Every 12 hours 08/24/2016 1624 08/26/16 0430   09/10/2016 0705  dextrose 5 % with cefUROXime (ZINACEF) ADS Med    Comments:  Scronce, Trina   : cabinet override      08/23/2016 0705 09/05/2016 0842   09/05/2016 0604  cefUROXime (ZINACEF) 1.5 g in dextrose 5 % 50 mL IVPB     1.5 g 100 mL/hr over 30 Minutes Intravenous 30 min pre-op 09/13/2016 0604 08/24/2016  0842       V. Charlena CrossWells Khamarion Bjelland IV, M.D. [facility] [place] Office: [phone] Pager:  [phone]

## 2016-09-04 NOTE — Progress Notes (Signed)
PARENTERAL NUTRITION CONSULT NOTE - FOLLOW UP  Pharmacy Consult for TPN Indication: Prolonged ileus  Allergies  Allergen Reactions  . Lopressor [Metoprolol Tartrate] Palpitations    LOPRESSOR bothers the patient.  . Niacin Swelling    FACIAL EDEMA    Patient Measurements: Height: 5\' 9"  (175.3 cm) (Simultaneous filing. User may not have seen previous data.) Weight: 132 lb (59.9 kg) (Simultaneous filing. User may not have seen previous data.) IBW/kg (Calculated) : 70.7 Adjusted Body Weight: 59.9 BMI 19.49  Vital Signs: Temp: 100 F (37.8 C) (10/21 0747) Temp Source: Oral (10/21 0747) BP: 177/73 (10/21 0747) Pulse Rate: 95 (10/21 0747) Intake/Output from previous day: 10/20 0701 - 10/21 0700 In: 3458 [I.V.:3158; IV Piggyback:300] Out: 3000 [Urine:3000] Intake/Output from this shift: No intake/output data recorded.  Labs:  Recent Labs  09/02/16 0420 09/03/16 0500  WBC 11.9* 15.3*  HGB 7.3* 9.1*  HCT 21.8* 27.5*  PLT 189 203     Recent Labs  09/02/16 0420 09/03/16 0500 09/03/16 0726 09/04/16 0357  NA 143 138 141 140  K 3.6 4.1 3.3* 3.1*  CL 118* 113* 115* 112*  CO2 20* 19* 20* 22  GLUCOSE 122* 529* 189* 235*  BUN 25* 17 19 16   CREATININE 1.33* 1.12 1.10 1.09  CALCIUM 7.8* 7.9* 8.2* 8.1*  MG 1.8 1.8 1.6*  --   PHOS 2.5 3.2 1.4*  --   PROT 5.1* 5.3* 6.0*  --   ALBUMIN 2.1* 2.1* 2.4*  --   AST 27 27 31   --   ALT 41 34 37  --   ALKPHOS 42 50 55  --   BILITOT 0.9 1.0 1.2  --   PREALBUMIN 5.1* 5.6*  --   --   TRIG 66 190*  --   --    Estimated Creatinine Clearance: 49.6 mL/min (by C-G formula based on SCr of 1.09 mg/dL).    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 The patient is noted to have an ileus post-op and pharmacy has  been consulted to start TPN for nutrition.   GI: Post-op ileus - very minimal intake charted 10/13-10/14 (clears), so essentially NPO for >7d. NGT . Abd less distended, pt pulled out NG 10/20 during episode confusion; replace if develop N/V. LBM 10/19.  Decr bowel sounds, (-)flatus, (-)N/V. Remains NPO, but receiving po meds. Reglan IV, Pepcid IV Endo:Hx DM. CBGs/24h: 159-239. Currently on SSI q4h + tradjenta Lytes: K 3.1- decr, 3 runs 10/20 & 1 run this AM per TCTS prn order.   Renal:SCr 1.1- stable, CrCl~50 ml/min.  UOP , I/O 417 Pulm: Now on [place]-4L, up from 1.5L yest Cards: Hx Afib - full dose Enox + Warf. Hypertensive & receiving prn Hydralazine/Metoprolol & NTG, HR brady-tachy.  Amlodipine, Clonidine TTS, Hydralazine prn, Cozaar, Metoprolol prn, Propafenone Hepatobil:AST/ALT mildly elevated on 10/16, Tbili wnl.  TG 190, Palb 5.6- indicating nutritional depletion  Neuro:  Mostly unresponsive this AM x to painful stim & occ speak when spoken to- plan xfer to ICU, neuro consult [id number], WBC 15.3- trending up, on Vanc/Zosyn (10/21 >> )  Best Practices:Enox 1mg /kg TPN Access:10/18 TPN start date:10/19 >>  Nutritional Goals (per RD recommendation on 10/12): KCal:1800-2000 Protein: 80-95g  Goal Rate:  Clinimix E 5/20 at 65 ml/hr and 20%  lipids at 10 ml/hr- providing 1853kcal and 78g protein, meeting 100% caloric goals and 98% protein goals  Current Nutrition: NPO  Plan: Change to Clinimix-E 5/20 at 7965ml/hr and Intralipid 8810ml/hr  Add Trace elements to TPN, no MVI d/t facial swelling with Niacin Increase insulin in TPN to 20 units Change SSI to moderate Give additional 3 K runs BMet, Mg in AM TPN labs qMon/Thurs F/U resolution of ileus and start of po intake   [name], PharmD Clinical Pharmacist [place] [facility]

## 2016-09-04 NOTE — Progress Notes (Signed)
Pt transferred to 2S02 from 3S; pt alert at this time; pt opens eyes to verbal stimuli; pt alert to self and place; speech slurred some from baseline per family; pt moving all extremities purposefully; will cont. To monitor.  Benjamine SpragueYates, Tracyann Duffell A

## 2016-09-04 NOTE — Progress Notes (Addendum)
      [address]       [place] [id number]             [phone]      10 Days Post-Op Procedure(s) (LRB): THORACIC AORTIC ENDOVASCULAR STENT GRAFT WITH RETROPERITONEAL EXPOSURE (N/A)   Subjective:  [name] is unresponsive this morning.  He will open his eyes to painful stimuli, occasionally with speak when spoken to.  Objective: Vital signs in last 24 hours: Temp:  [98 F (36.7 C)-100.2 F (37.9 C)] 100 F (37.8 C) (10/21 0747) Pulse Rate:  [81-111] 95 (10/21 0747) Cardiac Rhythm: Sinus tachycardia;Bundle branch block (10/21 0700) Resp:  [17-24] 23 (10/21 0747) BP: (160-193)/(56-74) 177/73 (10/21 0747) SpO2:  [88 %-95 %] 94 % (10/21 0747)  Intake/Output from previous day: 10/20 0701 - 10/21 0700 In: 3458 [I.V.:3158; IV Piggyback:300] Out: 3000 [Urine:3000]  General appearance: mild distress and unresponsive Heart: regular rate and rhythm and tachy Lungs: diminished breath sounds bibasilar Abdomen: soft, non-tender; bowel sounds normal; no masses,  no organomegaly Wound: clean and dry Neuro- there is a change from yesterday with decrease strength in upper and lower extremities, good response to painful stimuli  Lab Results:  Recent Labs  09/02/16 0420 09/03/16 0500  WBC 11.9* 15.3*  HGB 7.3* 9.1*  HCT 21.8* 27.5*  PLT 189 203   BMET:  Recent Labs  09/03/16 0726 09/04/16 0357  NA 141 140  K 3.3* 3.1*  CL 115* 112*  CO2 20* 22  GLUCOSE 189* 235*  BUN 19 16  CREATININE 1.10 1.09  CALCIUM 8.2* 8.1*    PT/INR: No results for input(s): LABPROT, INR in the last 72 hours. ABG    Component Value Date/Time   PHART 7.465 (H) 09/04/2016 0800   HCO3 22.1 09/04/2016 0800   TCO2 25 31-Mar-2016 1714   ACIDBASEDEF 1.0 09/04/2016 0800   O2SAT 91.6 09/04/2016 0800   CBG (last 3)   Recent Labs  09/04/16 0000 09/04/16 0318 09/04/16 0743  GLUCAP 268* 272* 189*    Assessment/Plan: S/P Procedure(s) (LRB): THORACIC AORTIC ENDOVASCULAR  STENT GRAFT WITH RETROPERITONEAL EXPOSURE (N/A)  1. CV- remains hypertensive 180-190s- despite NTG drip and prn medications.  Patient likely needs second agent vs. Discontinuation of NTG and attempt with second drip 2. Neuro- mostly unresponsive this morning.. Exam changed from yesterday with decrease in grip strength and lower extremity strength, unable to hold either arm up today.... CT head yesterday was negative for acute process... ? Repeat head CT 3. Pulm- increasing breathing requirement on 4l oxygen, blood gas obtained with decreased O2 level of 64, pH 7.4, Hco3 22 and base deficit of -1 4. ID- + fever 102, leukocytosis, UA negative,  blood cultures ordered 5.GI- exam remains benign, no N/V 6. Dispo- patient with new onset unresponsiveness, will transfer to ICU, will need adjustments to drips for Hypertension, may benefit from CCM and Neurology consult   LOS: 10 days    BARRETT, ERIN 09/04/2016  I have seen and examined the patient and agree with the assessment and plan as outlined by Dr. Myra GianottiBrabham.  [name] Nailslarence H Tashyra Adduci, MD 09/04/2016 2:58 PM

## 2016-09-04 NOTE — Progress Notes (Signed)
CRITICAL VALUE ALERT  Critical value received: lactic acid 2.5  Date of notification:  09-04-16  Time of notification:  1523  Critical value read back:Yes.    Nurse who received alert:  Lonia BloodJamie Real Cona, RN  MD notified (1st page): Dr. Cherly BeachSomers  Time of first page:  1524  MD notified (2nd page):  Time of second page:  Responding MD:   Time MD responded:

## 2016-09-04 NOTE — Consult Note (Addendum)
Referring Physician: Dr. Myra Gianotti    Chief Complaint: altered mental status  HPI: [name] is an 75 y.o. male with a complicated PMHx including stroke and history of A. fib on long-term anticoagulation with Coumadin, severe peripheral artery disease status post fem-tib posterior tibial bypass and recent acute thrombosis requiring thrombolytic therapy , aortic aneurysm, hypertension, hyperlipidemia, diabetes, severe COPD, carotid artery occlusion status post carotid endarterectomy, A. Fib, chronic back pain, chronic renal insufficiency who was admitted on October 11 for thoracic aneurysm. No baseline patient is not independent with mobility ambulation, transfers ADLs or IADLs. Patient was taken to the OR for his thoracic aneurysm and thoracic aortic stenting. Patient developed an ileus, leukocytosis, blood transfusion her but he was improving and Coumadin restarted, continued hypertension on multiple agents. Yesterday patient was sleepy but easily arousable and answering questions, answering appropriately. He was moving extremities equally. CT of the head was obtained which was unremarkable for acute events. Yesterday evening there is a change in patient's mental status, more somnolent during the day, difficulty speaking and slurred speech. There was questionable right arm weakness noted. Also increased leukocytosis possibly infectious. This morning patient is very somnolent, he'll open his eyes to painful stimuli, he remains hypertensive with decrease in grip strength and lower extremity strength and unable to hold up either arm today, increased breathing requirements 4 L of decreased O2 level of 64 and he was transferred to the ICU.    Past Medical History:  Diagnosis Date  . AAA (abdominal aortic aneurysm) (HCC)   . Anemia   . Anxiety   . Arthritis    Gout  . Atrial fibrillation (HCC)   . Barrett esophagus   . Carotid artery occlusion    Carotid endarterectomy 2002 and 2004  . COPD (chronic  obstructive pulmonary disease) (HCC)   . Diabetes mellitus   . Diverticulosis   . DVT (deep venous thrombosis) (HCC)   . Erectile dysfunction   . GERD (gastroesophageal reflux disease)   . Gout   . Hiatal hernia   . History of stress test    March 2012 post stress ejection fraction 56%, negative for ischemia.  Last 2-D echocardiogram March 2012 younger than 55% mild concentric left ventricular hypertrophy grade 1 diastolic dysfunction. Mildly dilated left atrium. 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  . Hyperlipidemia   . Hypertension   . Kidney stone   . Lumbar stenosis   . PAD (peripheral artery disease) (HCC)    Fem-Fem by-pass graft 1991. redo patch angioplasty in 2000 and 2004.  LEA dopplers: 04/2012- R-ABI 1.34, L-ABI 0.97, patent Left lower extremity graft.  . Pancreatitis   . Stroke [facility]) March 2016   Lihgt Stroke    Past Surgical History:  Procedure Laterality Date  . CAROTID ENDARTERECTOMY  2002   Left CEA  . CAROTID ENDARTERECTOMY  2007   Left CEA redo  . CATARACT EXTRACTION Left 02-02-95  . CATARACT EXTRACTION Right 04-20-95  . colonoscopy    . IR GENERIC HISTORICAL  06/13/2016   IR INFUSION THROMBOL ARTERIAL INITIAL (MS) 06/13/2016 Richarda Overlie, MD [facility]  . IR GENERIC HISTORICAL  06/13/2016   IR ANGIOGRAM EXTREMITY LEFT 06/13/2016 Richarda Overlie, MD [facility]  . IR GENERIC HISTORICAL  06/13/2016   IR US GUIDE VASC ACCESS LEFT 06/13/2016 Richarda Overlie, MD [facility]  . IR GENERIC HISTORICAL  06/14/2016   IR THROMB F/U EVAL ART/VEN FINAL DAY (MS) 06/14/2016 Oley Balm, MD [facility]  . LUMBAR DISC SURGERY  1980  .  PR VEIN BYPASS GRAFT,AORTO-FEM-POP  2007   Left Fem-post. tibial  . PR VEIN BYPASS GRAFT,AORTO-FEM-POP  1999-2004   numerous revision Left Fem-pop  . THORACIC AORTIC ENDOVASCULAR STENT GRAFT N/A 11-04-2016   Procedure: THORACIC AORTIC ENDOVASCULAR STENT GRAFT WITH RETROPERITONEAL EXPOSURE;  Surgeon: Nada LibmanVance W Brabham, MD;  [facility];  Service: Vascular;   Laterality: N/A;    Family History  Problem Relation Age of Onset  . Heart disease Father   . Heart attack Father     X's 3  . Hyperlipidemia Father   . Hypertension Father   . Diabetes Brother   . Hypertension Brother   . Cancer Sister     type unknown   Social History:  reports that he has been smoking Cigarettes and E-cigarettes.  He has a 60.00 pack-year smoking history. He quit smokeless tobacco use about 48 years ago. His smokeless tobacco use included Chew. He reports that he does not drink alcohol or use drugs.  Allergies:  Allergies  Allergen Reactions  . Lopressor [Metoprolol Tartrate] Palpitations    LOPRESSOR bothers the patient.  . Niacin Swelling    FACIAL EDEMA    Medications: I have reviewed the patient's current medications.   Current Facility-Administered Medications:  .  [DISCONTINUED] acetaminophen (TYLENOL) tablet 325-650 mg, 325-650 mg, Oral, Q4H PRN **OR** acetaminophen (TYLENOL) suppository 325-650 mg, 325-650 mg, Rectal, Q4H PRN, Nada LibmanVance W Brabham, MD, 650 mg at 09/02/16 1106 .  alum & mag hydroxide-simeth (MAALOX/MYLANTA) 200-200-20 MG/5ML suspension 15-30 mL, 15-30 mL, Oral, Q2H PRN, Nada LibmanVance W Brabham, MD .  amLODipine (NORVASC) tablet 10 mg, 10 mg, Oral, Q supper, Nada LibmanVance W Brabham, MD .  bisacodyl (DULCOLAX) suppository 10 mg, 10 mg, Rectal, Daily PRN, Maeola HarmanBrandon Christopher Cain, MD, 10 mg at 09/03/16 0746 .  chlorhexidine (PERIDEX) 0.12 % solution 15 mL, 15 mL, Mouth Rinse, BID, Nada LibmanVance W Brabham, MD, 15 mL at 09/03/16 2154 .  cloNIDine (CATAPRES - Dosed in mg/24 hr) patch 0.3 mg, 0.3 mg, Transdermal, Weekly, Nada LibmanVance W Brabham, MD, 0.3 mg at 09/03/16 1002 .  docusate sodium (COLACE) capsule 200 mg, 200 mg, Oral, BID PRN, Nada LibmanVance W Brabham, MD, 200 mg at 08/30/16 0519 .  enoxaparin (LOVENOX) injection 60 mg, 60 mg, Subcutaneous, Q12H, Norva PavlovCrystal S Robertson, RPH, 60 mg at 09/03/16 2155 .  famotidine (PEPCID) IVPB 20 mg premix, 20 mg, Intravenous, Q12H, Delight OvensEdward B  Gerhardt, MD, 20 mg at 09/03/16 2155 .  TPN (CLINIMIX-E) Adult, , Intravenous, Continuous TPN, Last Rate: 65 mL/hr at 09/03/16 2000 **AND** fat emulsion 20 % infusion 240 mL, 240 mL, Intravenous, Continuous TPN, Renaee MundaKendra P Hiatt, RPH, Last Rate: 10 mL/hr at 09/03/16 2000, 240 mL at 09/03/16 2000 .  TPN (CLINIMIX-E) Adult, , Intravenous, Continuous TPN **AND** fat emulsion 20 % infusion 240 mL, 240 mL, Intravenous, Continuous TPN, Kendra P Hiatt, RPH .  guaiFENesin-dextromethorphan (ROBITUSSIN DM) 100-10 MG/5ML syrup 15 mL, 15 mL, Oral, Q4H PRN, Nada LibmanVance W Brabham, MD .  hydrALAZINE (APRESOLINE) injection 20 mg, 20 mg, Intravenous, Q20 Min PRN, Nada LibmanVance W Brabham, MD .  insulin aspart (novoLOG) injection 0-15 Units, 0-15 Units, Subcutaneous, Q4H, Kendra P Hiatt, RPH .  losartan (COZAAR) tablet 50 mg, 50 mg, Oral, Q lunch, Delight OvensEdward B Gerhardt, MD, 50 mg at 09/03/16 1008 .  MEDLINE mouth rinse, 15 mL, Mouth Rinse, q12n4p, Nada LibmanVance W Brabham, MD, 15 mL at 09/03/16 1131 .  metoCLOPramide (REGLAN) injection 10 mg, 10 mg, Intravenous, Q6H, Kerin PernaPeter Van Trigt, MD, 10 mg at 09/04/16 0535 .  metoprolol (LOPRESSOR) injection 10 mg, 10 mg, Intravenous, Q5 min PRN, Nada Libman, MD .  mometasone-formoterol [facility]) 200-5 MCG/ACT inhaler 2 puff, 2 puff, Inhalation, BID, Nada Libman, MD, 2 puff at 09/03/16 2026 .  nicardipine (CARDENE) 20mg  in 0.86% saline IV infusion (0.1 mg/ml), 3-15 mg/hr, Intravenous, Continuous, Jeanella Craze, NP .  ondansetron (ZOFRAN) injection 4 mg, 4 mg, Intravenous, Q6H PRN, Nada Libman, MD, 4 mg at 08/27/16 1712 .  phenol (CHLORASEPTIC) mouth spray 1 spray, 1 spray, Mouth/Throat, PRN, Nada Libman, MD, 1 spray at 08/29/16 2000 .  piperacillin-tazobactam (ZOSYN) IVPB 3.375 g, 3.375 g, Intravenous, Q8H, Titus Mould, RPH .  potassium chloride 10 mEq in 50 mL *CENTRAL LINE* IVPB, 10 mEq, Intravenous, Q1H PRN, Delight Ovens, MD, 10 mEq at 09/04/16 0535 .  potassium chloride 10 mEq  in 50 mL *CENTRAL LINE* IVPB, 10 mEq, Intravenous, Q1 Hr x 3, Kendra P Hiatt, RPH .  propafenone (RYTHMOL) tablet 225 mg, 225 mg, Oral, BID, Nada Libman, MD, 225 mg at 09/03/16 2154 .  sodium chloride flush (NS) 0.9 % injection 10-40 mL, 10-40 mL, Intracatheter, Q12H, Delight Ovens, MD, 20 mL at 09/03/16 0804 .  sodium chloride flush (NS) 0.9 % injection 10-40 mL, 10-40 mL, Intracatheter, PRN, Delight Ovens, MD .  tamsulosin [facility]) capsule 0.4 mg, 0.4 mg, Oral, Daily PRN, Nada Libman, MD .  vancomycin (VANCOCIN) 500 mg in sodium chloride 0.9 % 100 mL IVPB, 500 mg, Intravenous, Q12H, Titus Mould, RPH, 500 mg at 09/04/16 0112 .  Vitamin D (Ergocalciferol) (DRISDOL) capsule 50,000 Units, 50,000 Units, Oral, Q7 days, Nada Libman, MD, 50,000 Units at 09/03/16 0016  ROS: Unable to obtain due to mental status  Physical Examination: Blood pressure (!) 177/73, pulse 95, temperature 100 F (37.8 C), temperature source Oral, resp. rate (!) 23, height 5\' 9"  (1.753 m), weight 59.9 kg (132 lb), SpO2 94 %.  Laboratory Studies:  Basic Metabolic Panel:  Recent Labs Lab 09/01/16 0335 09/02/16 0420 09/03/16 0500 09/03/16 0726 09/04/16 0357  NA 140 143 138 141 140  K 4.0 3.6 4.1 3.3* 3.1*  CL 114* 118* 113* 115* 112*  CO2 19* 20* 19* 20* 22  GLUCOSE 173* 122* 529* 189* 235*  BUN 29* 25* 17 19 16   CREATININE 1.37* 1.33* 1.12 1.10 1.09  CALCIUM 8.1* 7.8* 7.9* 8.2* 8.1*  MG  --  1.8 1.8 1.6*  --   PHOS  --  2.5 3.2 1.4*  --     Liver Function Tests:  Recent Labs Lab 08/30/16 0345 09/02/16 0420 09/03/16 0500 09/03/16 0726  AST 59* 27 27 31   ALT 93* 41 34 37  ALKPHOS 47 42 50 55  BILITOT 1.2 0.9 1.0 1.2  PROT 5.9* 5.1* 5.3* 6.0*  ALBUMIN 2.5* 2.1* 2.1* 2.4*    Recent Labs Lab 08/30/16 0345  AMYLASE 33   CBC:  Recent Labs Lab 08/30/16 0345 08/31/16 0500 09/01/16 0335 09/02/16 0420 09/03/16 0500  WBC 11.6* 11.1* 10.9* 11.9* 15.3*  NEUTROABS  --   --    --  9.4* 12.2*  HGB 8.2* 7.2* 7.6* 7.3* 9.1*  HCT 24.3* 21.6* 22.4* 21.8* 27.5*  MCV 91.7 92.7 92.2 91.6 95.2  PLT 173 145* 173 189 203    CBG:  Recent Labs Lab 09/03/16 1500 09/03/16 1926 09/04/16 0000 09/04/16 0318 09/04/16 0743  GLUCAP 209* 239* 268* 272* 189*    Microbiology: Results for orders placed  or performed during the hospital encounter of 08/17/16  Surgical pcr screen     Status: None   Collection Time: 08/17/16 12:42 PM  Result Value Ref Range Status   MRSA, PCR NEGATIVE NEGATIVE Final   Staphylococcus aureus NEGATIVE NEGATIVE Final    Comment:        The Xpert SA Assay (FDA approved for NASAL specimens in patients over 5 years of age), is one component of a comprehensive surveillance program.  Test performance has been validated by [facility] for patients greater than or equal to 61 year old. It is not intended to diagnose infection nor to guide or monitor treatment.     Coagulation Studies: No results for input(s): LABPROT, INR in the last 72 hours.  Urinalysis:  Recent Labs Lab 09/03/16 1829  COLORURINE YELLOW  LABSPEC 1.012  PHURINE 5.5  GLUCOSEU NEGATIVE  HGBUR NEGATIVE  BILIRUBINUR NEGATIVE  KETONESUR NEGATIVE  PROTEINUR 30*  NITRITE NEGATIVE  LEUKOCYTESUR NEGATIVE    Lipid Panel:    Component Value Date/Time   TRIG 190 (H) 09/03/2016 0500    HgbA1C:  Lab Results  Component Value Date   HGBA1C 6.5 (H) 08/27/2016    Imaging: Ct Head Wo Contrast  Result Date: 09/03/2016 CLINICAL DATA:  Sudden onset of somnolence, slurred speech and difficulty speaking. EXAM: CT HEAD WITHOUT CONTRAST TECHNIQUE: Contiguous axial images were obtained from the base of the skull through the vertex without intravenous contrast. COMPARISON:  MRI 12/16/2014 of the head FINDINGS: Brain: There is superficial and central atrophy. Chronic small vessel ischemic disease of periventricular white matter. Chronic left frontal and right centrum semiovale  infarcts are noted with volume loss. Small chronic basal ganglial lacunes are seen bilaterally. No acute intracranial hemorrhage, large vascular territory infarction no acute loss of gray - white matter distinction is identified. No extra-axial fluid collections or focal mass lesions are seen. Vascular: Atheromatous calcifications of the both vertebral arteries and cavernous carotids bilaterally. Skull: No acute osseous abnormality. Sinuses/Orbits: Under pneumatized mastoids and frontal sinus. Minimal sphenoid and ethmoid sinus mucosal thickening. Bilateral lens replacement surgical changes are noted. Other: None IMPRESSION: Superficial and central atrophy. Chronic left frontal and right centrum semiovale infarct with volume loss. Small bilateral basal ganglial chronic lacune or infarcts. Chronic small vessel ischemic disease of periventricular white matter. No acute intracranial abnormality. Electronically Signed   By: Tollie Eth M.D.   On: 09/03/2016 19:01   Dg Chest Port 1 View  Result Date: 09/04/2016 CLINICAL DATA:  Followup pleural effusion EXAM: PORTABLE CHEST 1 VIEW COMPARISON:  09/03/2016 FINDINGS: Cardiac shadow is again at the upper limits of normal in size. Thoracic aortic stent graft is again seen and stable. The nasogastric catheter has been removed. Right PICC line is again noted and stable. The lungs are well aerated bilaterally. Some right basilar atelectasis is seen. The pleural effusions seen previously has decreased in the interval from the prior exam. There is also been some improvement in the degree of vascular congestion. IMPRESSION: Improved vascular congestion and right pleural effusion. Some right basilar atelectasis remains. Electronically Signed   By: Alcide Clever M.D.   On: 09/04/2016 07:34   Dg Chest Port 1 View  Result Date: 09/03/2016 CLINICAL DATA:  Aortic aneurysm.  COPD. EXAM: PORTABLE CHEST 1 VIEW COMPARISON:  08/27/2016 . FINDINGS: Right PICC line noted with tip  projected over the SVC. NG tube noted with tip below left hemidiaphragm. Aortic stent graft in stable position. Cardiomegaly with pulmonary vascular prominence bilateral  interstitial prominence and bilateral pleural effusions consistent congestive heart failure. Interval resolution of previously identified free air under the right hemidiaphragm. IMPRESSION: 1. NG tube noted with tip below left hemidiaphragm. Right PICC line noted with tip projected over the right SVC. 2. Cardiomegaly with bilateral pulmonary edema and bilateral pleural effusions. Aortic stent graft in stable position. Electronically Signed   By: Maisie Fus  Register   On: 09/03/2016 08:45   Dg Abd Portable 1v  Result Date: 09/04/2016 CLINICAL DATA:  Ileus EXAM: PORTABLE ABDOMEN - 1 VIEW COMPARISON:  09/03/2016 FINDINGS: Nasogastric catheter has been removed. Scattered large and small bowel gas is noted. There remains small bowel dilatation although it has improved somewhat in the interval from the prior exam. No free air is seen. No other focal abnormality is noted. Degenerative changes of lumbar spine are seen. IMPRESSION: Persistent but improved small bowel dilatation. Slight increase in the degree of colonic gas is noted. Changes may represent ileus versus partial small bowel obstruction. Correlation with the clinical exam is recommended. Electronically Signed   By: Alcide Clever M.D.   On: 09/04/2016 07:36   Dg Abd Portable 1v  Result Date: 09/03/2016 CLINICAL DATA:  Ileus, hypertension, diabetes mellitus EXAM: PORTABLE ABDOMEN - 1 VIEW COMPARISON:  Portable exam 0617 hours compared to 09/02/2016 FINDINGS: Persistent gaseous distention of small bowel loops throughout abdomen little changed. Probable mild wall thickening of loops in the RIGHT mid abdomen little changed. Some gas present within the colonic flexures. Atherosclerotic calcification aorta with evidence of prior thoracoabdominal aortic stenting. Bones demineralized with  degenerative disc and facet disease changes of the thoracolumbar spine. IMPRESSION: Persistent gaseous distention and minimal bowel wall thickening of small bowel loops favoring ileus. Electronically Signed   By: Ulyses Southward M.D.   On: 09/03/2016 08:46   Dg Abd Portable 1v  Result Date: 09/02/2016 CLINICAL DATA:  Ileus EXAM: PORTABLE ABDOMEN - 1 VIEW COMPARISON:  09/01/2016 FINDINGS: Mild gaseous distended small bowel loops are noted in left abdomen with slight improvement from prior exam. Findings may be due to ileus or enteritis. Degenerative changes lumbar spine again noted. NG tube stable in position. IMPRESSION: Mild gaseous distended small bowel loops in left abdomen with slight improvement from prior exam. Findings may be due to ileus or enteritis. NG tube is unchanged in position. Electronically Signed   By: Natasha Mead M.D.   On: 09/02/2016 09:45   Physical exam: Exam: Gen: Labored respirations with mouth open CV: RRR, tachycardic. No Carotid Bruits. No peripheral edema. Eyes: Conjunctivae clear without exudates or hemorrhage  Neuro: Detailed Neurologic Exam  Speech:    Patient's speech is unintelligible Cognition:    Patient does not open eyes to noxious stimulation, if eyes are held open he does appear to gaze at you and tries to respond to questions but his speech is unintelligible. He is not following commands. Cranial Nerves:    The pupils are small but equal, round, and reactive to light. Attempted funduscopic exam could not visualize due to small pupils. Patient appears to cross the midline bilaterally however cannot fully test extraocular movements. Conjugate midline gaze. The face appears symmetric. The tongue appears to be midline with no fasciculations.  Motor Observation:   Generalized atrophy, no involuntary movements noted   Motor and sensation:   Patient appears to have weakness on the left side mostly in the left arm this may be chronic per report, he does  withdrawal minimally and the other extremities.     Reflex Exam:  DTR's:    Deep tendon reflexes in the upper and lower extremities are brisk bilaterally.   Toes:    Amputations bilaterally  Assessment: 75 y.o. male  [name] is an 75 y.o. male with a complicated PMHx including stroke and history of A. fib on long-term anticoagulation with Coumadin, severe peripheral artery disease status post fem-tib posterior tibial bypass and recent acute thrombosis requiring thrombolytic therapy , aortic aneurysm, hypertension, hyperlipidemia, diabetes, severe COPD, carotid artery occlusion status post carotid endarterectomy, A. Fib, chronic back pain, chronic renal insufficiency who was admitted on October 11 for thoracic aneurysm.  Patient was taken to the OR for his thoracic aneurysm and thoracic aortic stenting. He's had a complicated hospital course was improving until yesterday when he had mental status change with worsening somnolence overnight. Patient has decreased PO2 64, leukocytosis with white blood cells 15.3, urinalysis unremarkable, chest x-ray unremarkable showing only vascular congestion and some right basilar atelectasis.  1. MRI of the brain with and without contrast to evaluate for strokes or other intracranial etiologies such as infection 2. EEG ordered to be completed this morning. A review during procedure shows mild slowing but no subclinical seizures or status.  Personally examined patient and images, and have participated in and made any corrections needed to history, physical, neuro exam,assessment and plan as stated above.  I have personally obtained the history, evaluated lab date, reviewed imaging studies and agree with radiology interpretations.    Naomie Dean, MD [facility] [phone]

## 2016-09-04 NOTE — Progress Notes (Signed)
EEG Completed; Results Pending  

## 2016-09-04 NOTE — Progress Notes (Signed)
eLink Physician-Brief Progress Note Patient Name: [name] DOB: Jun 08, 1941 MRN: [id number]   Date of Service  09/04/2016  HPI/Events of Note  Lactic Acid level = 2.5. Patient on Nicardipine IV infusion for BP control.   eICU Interventions  Will continue to trend Lactic Acid for resolution with improved BP control.      Intervention Category Major Interventions: Acid-Base disturbance - evaluation and management  Jewett Mcgann Eugene 09/04/2016, 3:46 PM

## 2016-09-04 NOTE — Procedures (Signed)
History: 75 y.o. male  [name] is an 75 y.o. male with a complicated PMHx including stroke and history of A. fib on long-term anticoagulation with Coumadin, severe peripheral artery disease status post fem-tib posterior tibial bypass and recent acute thrombosis requiring thrombolytic therapy , aortic aneurysm, hypertension, hyperlipidemia, diabetes, severe COPD, carotid artery occlusion status post carotid endarterectomy, A. Fib, chronic back pain, chronic renal insufficiency who was admitted on October 11 for thoracic aneurysm.He's had a complicated hospital course was improving until yesterday when he had mental status change with worsening somnolence overnight. Patient has decreased PO2 64, leukocytosis with white blood cells 15.3, urinalysis unremarkable, chest x-ray unremarkable showing only vascular congestion and some right basilar atelectasis, increasingly labored respirations.  Technique: This is a 21 channel routine scalp EEG performed at the bedside with bipolar and monopolar montages arranged in accordance to the international 10/20 system of electrode placement. One channel was dedicated to EKG recording.   EEG Abnormalities:  The recording demonstrated a symmetric, moderate voltage mixed frequency arrhythmic activity that was minimally reactive and a poorly formed posterior dominant rhythm of 6hz . Biphasic and triphasic waves were not seen. No sleep patterns were noted. No asymmetries or focal abnormalities were noted. No epileptiform discharges were seen.    Interpretation:  Abnormal EEG demonstrating diffuse or multifocal encephalopathy. No epileptiform activity or electrographic seizures were seen.   If 7pm- 7am, please page neurology on call as listed in AMION.  .Marland Kitchen

## 2016-09-04 NOTE — Progress Notes (Signed)
Morning handoff with nightshift nurse revealed patient was unresponsive to any stimuli. Vital signs are stable but blood pressure continues to be elevated.  Dr. Myra GianottiBrabham, Lelon MastSamantha, and Rapid response were called.

## 2016-09-04 NOTE — Significant Event (Addendum)
Rapid Response Event Note  Overview: Time Called: 0741 Arrival Time: 0745 Event Type: Neurologic  Initial Focused Assessment: Called by RNs to assess patient.  Per nursing, patient is unresponsive, harder to arouse. Patient has had periods of fluctuation in his mental status. On assessment, patient was not being easily aroused, minimal response to pain at times.  Patient's did have + gag, CR, and cough, his pupils were reactive R slightly greater than L. + heart sounds, lung were diminished, patient's breathing was labored but not in acute respiratory distress (however patient was mouth breathing).  + pulse in extremities, abdomen soft/nontender.  RNs contacted VVS PA and CTS PA and both arrived at bedside. HR: ST 100-115, + PVCS (K- 3.1), BP elevated, has been elevated for days, currently on 110mcg of NTG and SBP still 170-190s, patient was and has been receiving PRN antihypertensives. Mild temp around 99-100 on and off.  NG removed yesterday, no N/V reported  Interventions: -- Narcan given -- ABG obtained  -- MD arrived,  Neuro and CCM consulted -- Per Dr. Myra GianottiBrabham patient will need to move to ICU, family obtained  Side note: I was called away to assess another patient, CCM did order a CARDENE drip, which I started at 1115, BP and HR monitored, was called away for to assess another patient, RN was called several times for udpates while ICU bed was obtained, RN was able to titrate drip as well with instructions.  Plan of Care (if not transferred): -- Tx to 2S with 3S RNS  Event Summary: Name of Physician Notified: Alyssa GroveS. Rhyne, Jaclyn PrimeE. Barrett, Dr. Myra GianottiBrabham at [phone]  Name of Consulting Physician Notified: Dr. Marchelle Gearingamaswamy and Dr. Lucia GaskinsAhern by Dr. Myra GianottiBrabham   Outcome: Transferred (Comment)  Event End Time: 1130  Clancey Welton R

## 2016-09-04 NOTE — Progress Notes (Signed)
PT Cancellation Note  Patient Details Name: Debroah Ballerrchie L Ess MRN: [id number] DOB: 1941/07/01   Cancelled Treatment:    Reason Eval/Treat Not Completed: Medical issues which prohibited therapy (nursing asked PT to sign off.  MD please reorder if pt status changes. Thanks.)   Tawni MillersWhite, Evolet Salminen F 09/04/2016, 11:45 AM  Eber Jonesawn Tippi Mccrae,PT Acute Rehabilitation [phone] (pager)

## 2016-09-04 NOTE — Consult Note (Signed)
PULMONARY / CRITICAL CARE MEDICINE   Name: [name] MRN: [id number] DOB: 1941-09-07    ADMISSION DATE:  September 19, 2016 CONSULTATION DATE:  09/04/16 REFERRING MD:  Dr. Myra Gianotti   CHIEF COMPLAINT:  Altered Mental Status   HISTORY OF PRESENT ILLNESS:   75 year old male with a PMH of anemia, GERD, Barrett's esophagus, anxiety, arthritis, gout, hiatal hernia, lumbar stenosis (on 20 mg oxycodone TID), CVA (02/02/2015), diabetes mellitus, CAD, atrial fibrillation on Coumadin, COPD, prior DVT,  extensive peripheral vascular disease and AAA admitted 10/11 for planned endovascular thoracic aortic stent placement.    Preoperatively the patient was seen by pulmonary and started on Levaquin. This was continued post admission. Postoperative patient was extubated.  Course complicated by hypertension requiring nitroglycerin drip, acute on chronic renal insufficiency and baseline malnutrition.  Course complicated further by postoperative ileus requiring NG tube decompression. Blood pressure control was variable with NPO status / NTG gtt.  He required multiple agents for control. The patient was making slow recovery. However on 10/20 he pulled his NG tube out. The patient was observed and he tolerated tube removal. Family reports on 10/20 he was able to participate with incentive spirometry / coughing and deep breathing. However overnight, he developed worsening confusion, rising blood pressure and fever.  CT of the head was assessed and negative for acute process. It did show chronic small vessel disease. Temperature max 101.9.  UA assessed and negative. KUB demonstrated slight improvement in ileus. He also was noted to have 2 bowel movements on 10/21.  The patient has been on the code and and morphine for pain. Given altered mental status he was treated with Narcan a.m. 10/21 without response.  Nitroglycerin drip was restarted for hypertension (BP systolic 170-200).  MAR review shows the patient has received  oxycodone & morphine for pain.    PCCM consulted for evaluation of hypertension and altered mental status.  PAST MEDICAL HISTORY :  He  has a past medical history of AAA (abdominal aortic aneurysm) (HCC); Anemia; Anxiety; Arthritis; Atrial fibrillation (HCC); Barrett esophagus; Carotid artery occlusion; COPD (chronic obstructive pulmonary disease) (HCC); Diabetes mellitus; Diverticulosis; DVT (deep venous thrombosis) (HCC); Erectile dysfunction; GERD (gastroesophageal reflux disease); Gout; Hiatal hernia; History of stress test; Hyperlipidemia; Hypertension; Kidney stone; Lumbar stenosis; PAD (peripheral artery disease) (HCC); Pancreatitis; and Stroke [facility]) (March 2016).  PAST SURGICAL HISTORY: He  has a past surgical history that includes vein bypass graft,aorto-fem-pop (2007); vein bypass graft,aorto-fem-pop (1999-2004); Carotid endarterectomy (2002); Carotid endarterectomy (2007); Cataract extraction (Left, 02-02-95); Cataract extraction (Right, 04-20-95); Lumbar disc surgery (1980); ir generic historical (06/13/2016); ir generic historical (06/13/2016); ir generic historical (06/13/2016); ir generic historical (06/14/2016); colonoscopy; and Thoracic aortic endovascular stent graft (N/A, 09-19-16).  Allergies  Allergen Reactions  . Lopressor [Metoprolol Tartrate] Palpitations    LOPRESSOR bothers the patient.  . Niacin Swelling    FACIAL EDEMA    No current facility-administered medications on file prior to encounter.    Current Outpatient Prescriptions on File Prior to Encounter  Medication Sig  . ALPRAZolam (XANAX) 0.5 MG tablet Take 0.25-0.5 mg by mouth at bedtime as needed for anxiety.   Marland Kitchen amLODipine (NORVASC) 5 MG tablet Take 5 mg by mouth daily with supper.   Marland Kitchen atorvastatin (LIPITOR) 80 MG tablet Take 40 mg by mouth every evening.   . cilostazol (PLETAL) 100 MG tablet Take 100 mg by mouth 2 (two) times daily at 8 am and 10 pm.   . cloNIDine (CATAPRES) 0.3 MG tablet Take 0.1 mg by mouth  3 (three) times daily.   Marland Kitchen. docusate sodium (COLACE) 100 MG capsule Take 200 mg by mouth 2 (two) times daily as needed for mild constipation.   . Ferrous Fumarate-Folic Acid (FERROCITE F PO) Take 1 capsule by mouth daily before lunch.   Marland Kitchen. glipiZIDE (GLUCOTROL) 5 MG tablet Take 5 mg by mouth 2 (two) times daily before a meal.  . losartan (COZAAR) 100 MG tablet Take 100 mg by mouth daily with lunch.   . metoprolol tartrate (LOPRESSOR) 25 MG tablet TAKE 1 TABLET BY MOUTH TWICE DAILY  . mometasone-formoterol (DULERA) 200-5 MCG/ACT AERO Inhale 2 puffs into the lungs 2 (two) times daily.  Marland Kitchen. omega-3 acid ethyl esters (LOVAZA) 1 G capsule Take 2 g by mouth 2 (two) times daily.   Marland Kitchen. oxyCODONE 20 MG TABS Take 1 tablet (20 mg total) by mouth every 8 (eight) hours as needed for moderate pain.  . pantoprazole (PROTONIX) 40 MG tablet Take 40 mg by mouth 2 (two) times daily.   . propafenone (RYTHMOL) 225 MG tablet Take 225 mg by mouth 2 (two) times daily.  Marland Kitchen. Respiratory Therapy Supplies (FLUTTER) DEVI Use as directed.  . saxagliptin HCl (ONGLYZA) 2.5 MG TABS tablet Take 2.5 mg by mouth daily.  Marland Kitchen. testosterone cypionate (DEPOTESTOTERONE CYPIONATE) 200 MG/ML injection Inject 100 mg into the skin every 14 (fourteen) days.   . Vitamin D, Ergocalciferol, (DRISDOL) 50000 UNITS CAPS capsule Take 50,000 Units by mouth every 7 (seven) days. Wednesday  . Warfarin Sodium (COUMADIN PO) Take 4-6 mg by mouth as directed. 4 mg daily with the exception of 6 mg on Fridays    FAMILY HISTORY:  His indicated that his mother is deceased. He indicated that his father is deceased. He indicated that two of his three sisters are alive. He indicated that his brother is deceased. He indicated that his maternal grandmother is deceased. He indicated that his maternal grandfather is deceased. He indicated that his paternal grandmother is deceased. He indicated that his paternal grandfather is deceased.    SOCIAL HISTORY: He  reports that he  has been smoking Cigarettes and E-cigarettes.  He has a 60.00 pack-year smoking history. He quit smokeless tobacco use about 48 years ago. His smokeless tobacco use included Chew. He reports that he does not drink alcohol or use drugs.  REVIEW OF SYSTEMS:  Unable to complete with patient due to altered mental status  SUBJECTIVE: Family reports worsening overnight with altered mental status. BM2  VITAL SIGNS: BP (!) 177/73 (BP Location: Left Arm)   Pulse 95   Temp 100 F (37.8 C) (Oral)   Resp (!) 23   Ht 5\' 9"  (1.753 m) Comment: Simultaneous filing. User may not have seen previous data.  Wt 132 lb (59.9 kg) Comment: Simultaneous filing. User may not have seen previous data.  SpO2 94%   BMI 19.49 kg/m   HEMODYNAMICS:    VENTILATOR SETTINGS:    INTAKE / OUTPUT: I/O last 3 completed shifts: In: 5081.6 [I.V.:4591.6; NG/GT:90; IV Piggyback:400] Out: 4850 [Urine:3900; Emesis/NG output:950]  PHYSICAL EXAMINATION: General:  Chronically ill appearing male lying in bed Neuro:  Lethargic, opens eyes with physical stimulation, mumbles HEENT:  MM pink/moist, upper dentures, no jvd, temporal wasting  Cardiovascular:  s1s2 irr irr, no m/r/g  Lungs:  Even/non-labored, lungs bilaterally diminished  Abdomen:  Flat, soft, mild tenderness with palpation  Musculoskeletal:  No acute deformities  Skin:  Warm/dry, no edema   LABS:  BMET  Recent Labs Lab 09/03/16 0500 09/03/16  0726 09/04/16 0357  NA 138 141 140  K 4.1 3.3* 3.1*  CL 113* 115* 112*  CO2 19* 20* 22  BUN 17 19 16   CREATININE 1.12 1.10 1.09  GLUCOSE 529* 189* 235*    Electrolytes  Recent Labs Lab 09/02/16 0420 09/03/16 0500 09/03/16 0726 09/04/16 0357  CALCIUM 7.8* 7.9* 8.2* 8.1*  MG 1.8 1.8 1.6*  --   PHOS 2.5 3.2 1.4*  --     CBC  Recent Labs Lab 09/01/16 0335 09/02/16 0420 09/03/16 0500  WBC 10.9* 11.9* 15.3*  HGB 7.6* 7.3* 9.1*  HCT 22.4* 21.8* 27.5*  PLT 173 189 203    Coag's  Recent  Labs Lab 08/30/16 0345 08/31/16 0500 09/01/16 0335  INR 1.23 1.28 1.27    Sepsis Markers No results for input(s): LATICACIDVEN, PROCALCITON, O2SATVEN in the last 168 hours.  ABG  Recent Labs Lab 09/04/16 0800  PHART 7.465*  PCO2ART 31.5*  PO2ART 64.2*    Liver Enzymes  Recent Labs Lab 09/02/16 0420 09/03/16 0500 09/03/16 0726  AST 27 27 31   ALT 41 34 37  ALKPHOS 42 50 55  BILITOT 0.9 1.0 1.2  ALBUMIN 2.1* 2.1* 2.4*    Cardiac Enzymes No results for input(s): TROPONINI, PROBNP in the last 168 hours.  Glucose  Recent Labs Lab 09/03/16 1140 09/03/16 1500 09/03/16 1926 09/04/16 0000 09/04/16 0318 09/04/16 0743  GLUCAP 179* 209* 239* 268* 272* 189*    Imaging Ct Head Wo Contrast  Result Date: 09/03/2016 CLINICAL DATA:  Sudden onset of somnolence, slurred speech and difficulty speaking. EXAM: CT HEAD WITHOUT CONTRAST TECHNIQUE: Contiguous axial images were obtained from the base of the skull through the vertex without intravenous contrast. COMPARISON:  MRI 12/16/2014 of the head FINDINGS: Brain: There is superficial and central atrophy. Chronic small vessel ischemic disease of periventricular white matter. Chronic left frontal and right centrum semiovale infarcts are noted with volume loss. Small chronic basal ganglial lacunes are seen bilaterally. No acute intracranial hemorrhage, large vascular territory infarction no acute loss of gray - white matter distinction is identified. No extra-axial fluid collections or focal mass lesions are seen. Vascular: Atheromatous calcifications of the both vertebral arteries and cavernous carotids bilaterally. Skull: No acute osseous abnormality. Sinuses/Orbits: Under pneumatized mastoids and frontal sinus. Minimal sphenoid and ethmoid sinus mucosal thickening. Bilateral lens replacement surgical changes are noted. Other: None IMPRESSION: Superficial and central atrophy. Chronic left frontal and right centrum semiovale infarct with  volume loss. Small bilateral basal ganglial chronic lacune or infarcts. Chronic small vessel ischemic disease of periventricular white matter. No acute intracranial abnormality. Electronically Signed   By: Tollie Eth M.D.   On: 09/03/2016 19:01   Dg Chest Port 1 View  Result Date: 09/04/2016 CLINICAL DATA:  Followup pleural effusion EXAM: PORTABLE CHEST 1 VIEW COMPARISON:  09/03/2016 FINDINGS: Cardiac shadow is again at the upper limits of normal in size. Thoracic aortic stent graft is again seen and stable. The nasogastric catheter has been removed. Right PICC line is again noted and stable. The lungs are well aerated bilaterally. Some right basilar atelectasis is seen. The pleural effusions seen previously has decreased in the interval from the prior exam. There is also been some improvement in the degree of vascular congestion. IMPRESSION: Improved vascular congestion and right pleural effusion. Some right basilar atelectasis remains. Electronically Signed   By: Alcide Clever M.D.   On: 09/04/2016 07:34   Dg Abd Portable 1v  Result Date: 09/04/2016 CLINICAL DATA:  Ileus EXAM:  PORTABLE ABDOMEN - 1 VIEW COMPARISON:  09/03/2016 FINDINGS: Nasogastric catheter has been removed. Scattered large and small bowel gas is noted. There remains small bowel dilatation although it has improved somewhat in the interval from the prior exam. No free air is seen. No other focal abnormality is noted. Degenerative changes of lumbar spine are seen. IMPRESSION: Persistent but improved small bowel dilatation. Slight increase in the degree of colonic gas is noted. Changes may represent ileus versus partial small bowel obstruction. Correlation with the clinical exam is recommended. Electronically Signed   By: Alcide Clever M.D.   On: 09/04/2016 07:36     STUDIES:  CT Head 10/20 >> superficial & central atrophy, chronic left frontal / right centrum semiovale infarct with volume loss, small bilateral basal ganglial chronic  lacune or infarcts, chronic small vessel ischemid disease of periventricular white matter, no acute abnormality  CULTURES: BCx2 10/21 >>   ANTIBIOTICS: Cefuroxime 10/11 for OR Vanco 10/21 >>  Zosyn 10/21 >>   SIGNIFICANT EVENTS: 10/11  Admit for endovascular stent of thoracic aortic aneurysm  10/14  Ileus, HTN 10/20  Pt pulled NGT out  10/21  Encephalopathy   LINES/TUBES:   DISCUSSION: 75 y/o M admitted for endovascular stent of thoracic aortic aneurysm.  Course complicated by ileus, difficult to control HTN, AKI, chronic pain, delirium, fever  ASSESSMENT / PLAN:  PULMONARY A: At Risk Aspiration / Concern for Airway Protection  COPD  P:   Aspiration precautions  May need intubation for airway protection O2 as needed to support sats >90% Pulmonary hygiene - IS, flutter, mobilize as able Intermittent CXR  Continue home Dulera   CARDIOVASCULAR A:  PAD s/p Thoracic Aortic Endovascular Stent 09-18-2016 Hypertensive Urgency  AF - on coumadin  CAD, HTN, HLD, AAA P:  Post-op Care per VVS/TCTS Transfer to ICU  Begin Cardene gtt for BP control  SBP Goal 140-160 Lovenox 60 mg BID for anticoagulation while NPO   RENAL A:   Hypokalemia  CKD  P:   Trend BMP / UOP  Replace electrolytes as indicated   GASTROINTESTINAL A:   Post-Operative Ileus - slight improvement on KUB 10/21 Hx Barrett Esophagus, Hiatal Hernia, GERD, Diverticulosis   P:   NPO  Reglan 10 mg IV Q6 PRN zofran  TPN per VVS  HEMATOLOGIC A:   Anemia  Coagulopathy - baseline coumadin administration Hx DVT P:  Trend CBC  Lovenox for DVT prophylaxis / full dose therapy   INFECTIOUS A:   Leukocytosis / Fever - may be atelectasis in the setting of AMS, r/o other sources  P:   Cultures as above  Empiric antibiotics as above   ENDOCRINE A:   Hyperglycemia    P:   SSI  NEUROLOGIC A:   Acute Encephalopathy - likely multifactorial in the setting of postop delirium, 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  CT head neg 10/20 for acute infarct Hx CVA - 01/2015 Hx Anxiety P:   RASS goal: n/a Hold home oxycodone Frequent neuro examination   FAMILY  - Updates: Wife and daughters updated at bedside 10/21  - Inter-disciplinary family meet or Palliative Care meeting due by:  Family indicate they would want full ACLS in the event of cardiopulmonary arrest. They're open to all measures that would return patient to prior baseline. However, he would not want to live on artificial support / "machines".      Canary Brim, NP-C [facility] Pgr: [phone] or if no  answer [phone] 09/04/2016, 10:28 AM

## 2016-09-05 ENCOUNTER — Inpatient Hospital Stay (HOSPITAL_COMMUNITY): Payer: Medicare Other

## 2016-09-05 DIAGNOSIS — I160 Hypertensive urgency: Secondary | ICD-10-CM

## 2016-09-05 DIAGNOSIS — R509 Fever, unspecified: Secondary | ICD-10-CM

## 2016-09-05 LAB — GLUCOSE, CAPILLARY
GLUCOSE-CAPILLARY: 240 mg/dL — AB (ref 65–99)
GLUCOSE-CAPILLARY: 154 mg/dL — AB (ref 65–99)
Glucose-Capillary: >600 mg/dL (ref 65–99)
Glucose-Capillary: >600 mg/dL (ref 65–99)
Glucose-Capillary: 200 mg/dL — ABNORMAL HIGH (ref 65–99)
Glucose-Capillary: 151 mg/dL — ABNORMAL HIGH (ref 65–99)
Glucose-Capillary: 131 mg/dL — ABNORMAL HIGH (ref 65–99)

## 2016-09-05 LAB — BASIC METABOLIC PANEL
ANION GAP: 6 (ref 5–15)
BUN: 21 mg/dL — AB (ref 6–20)
CHLORIDE: 115 mmol/L — AB (ref 101–111)
CO2: 22 mmol/L (ref 22–32)
Calcium: 8 mg/dL — ABNORMAL LOW (ref 8.9–10.3)
Creatinine, Ser: 1.11 mg/dL (ref 0.61–1.24)
GFR calc Af Amer: >60 mL/min (ref >60)
GFR calc non Af Amer: >60 mL/min (ref >60)
GLUCOSE: 228 mg/dL — AB (ref 65–99)
POTASSIUM: 3.2 mmol/L — AB (ref 3.5–5.1)
Sodium: 143 mmol/L (ref 135–145)

## 2016-09-05 LAB — MAGNESIUM: MAGNESIUM: 1.4 mg/dL — AB (ref 1.7–2.4)

## 2016-09-05 LAB — TROPONIN I: TROPONIN I: 0.03 ng/mL — AB (ref <0.03)

## 2016-09-05 LAB — CBC
HCT: 27 % — ABNORMAL LOW (ref 39.0–52.0)
Hemoglobin: 8.9 g/dL — ABNORMAL LOW (ref 13.0–17.0)
MCH: 31.4 pg (ref 26.0–34.0)
MCHC: 33 g/dL (ref 30.0–36.0)
MCV: 95.4 fL (ref 78.0–100.0)
PLATELETS: 173 10*3/uL (ref 150–400)
RBC: 2.83 MIL/uL — ABNORMAL LOW (ref 4.22–5.81)
RDW: 17.2 % — AB (ref 11.5–15.5)
WBC: 15.3 10*3/uL — AB (ref 4.0–10.5)

## 2016-09-05 LAB — PROCALCITONIN: PROCALCITONIN: 0.12 ng/mL

## 2016-09-05 LAB — LACTIC ACID, PLASMA: LACTIC ACID, VENOUS: 1.6 mmol/L (ref 0.5–1.9)

## 2016-09-05 LAB — PHOSPHORUS: PHOSPHORUS: 2.8 mg/dL (ref 2.5–4.6)

## 2016-09-05 MED ORDER — NICARDIPINE HCL IN NACL 40-0.83 MG/200ML-% IV SOLN
3.0000 mg/h | INTRAVENOUS | Status: DC
  Administered 2016-09-05 – 2016-09-06 (×8): 15 mg/h via INTRAVENOUS
  Filled 2016-09-05 (×13): qty 200

## 2016-09-05 MED ORDER — GADOBENATE DIMEGLUMINE 529 MG/ML IV SOLN
10.0000 mL | Freq: Once | INTRAVENOUS | Status: AC | PRN
  Administered 2016-09-05: 10 mL via INTRAVENOUS

## 2016-09-05 MED ORDER — BUDESONIDE 0.25 MG/2ML IN SUSP
0.2500 mg | Freq: Two times a day (BID) | RESPIRATORY_TRACT | Status: DC
  Administered 2016-09-05 – 2016-09-09 (×10): 0.25 mg via RESPIRATORY_TRACT
  Filled 2016-09-05 (×10): qty 2

## 2016-09-05 MED ORDER — ARFORMOTEROL TARTRATE 15 MCG/2ML IN NEBU
15.0000 ug | INHALATION_SOLUTION | Freq: Two times a day (BID) | RESPIRATORY_TRACT | Status: DC
  Administered 2016-09-05 – 2016-09-09 (×10): 15 ug via RESPIRATORY_TRACT
  Filled 2016-09-05 (×10): qty 2

## 2016-09-05 MED ORDER — FAT EMULSION 20 % IV EMUL
240.0000 mL | INTRAVENOUS | Status: AC
  Administered 2016-09-05: 240 mL via INTRAVENOUS
  Filled 2016-09-05: qty 250

## 2016-09-05 MED ORDER — IPRATROPIUM-ALBUTEROL 0.5-2.5 (3) MG/3ML IN SOLN
3.0000 mL | Freq: Four times a day (QID) | RESPIRATORY_TRACT | Status: DC
  Administered 2016-09-05 – 2016-09-06 (×7): 3 mL via RESPIRATORY_TRACT
  Filled 2016-09-05 (×7): qty 3

## 2016-09-05 MED ORDER — POTASSIUM CHLORIDE 10 MEQ/50ML IV SOLN
10.0000 meq | INTRAVENOUS | Status: AC
  Administered 2016-09-05 (×4): 10 meq via INTRAVENOUS
  Filled 2016-09-05: qty 50

## 2016-09-05 MED ORDER — TRACE MINERALS CR-CU-MN-SE-ZN 10-1000-500-60 MCG/ML IV SOLN
INTRAVENOUS | Status: AC
  Administered 2016-09-05: 18:00:00 via INTRAVENOUS
  Filled 2016-09-05: qty 1560

## 2016-09-05 MED ORDER — MAGNESIUM SULFATE 2 GM/50ML IV SOLN
2.0000 g | Freq: Once | INTRAVENOUS | Status: AC
  Administered 2016-09-05: 2 g via INTRAVENOUS
  Filled 2016-09-05: qty 50

## 2016-09-05 NOTE — Progress Notes (Signed)
Subjective  -   Feels better today Having trouble moving his tongue and speaking Thinks he is passing flatus  Physical Exam:  Garbled speech No focal deficits Palpable left bypass pulse over aneurysm    EEG:  Abnormal EEG demonstrating diffuse or multifocal encephalopathy. No epileptiform activity or electrographic seizures were seen.  CXR: Bibasilar infiltrates.  KUB:  Persistent but improved small bowel dilatation. Slight increase in the degree of colonic gas is noted.  Assessment/Plan:    Pulm:  CXR with bilateral infiltrates.  On vanc and Zosyn.  Will try to NTS to get sample for cultures to help with abx GI:  ? Flatus.  abd remains soft.  con't TPN for now.  Will get speech eval Neuro:  Garbled speech.  Speech eval  On SQ lovenox Appreciate assistance from neurology and CCM   [name], [place] 09/05/2016 9:27 AM --  Vitals:   09/05/16 0815 09/05/16 0830  BP: (!) 153/67 (!) 150/51  Pulse: 84 90  Resp: 18 (!) 25  Temp:      Intake/Output Summary (Last 24 hours) at 09/05/16 0927 Last data filed at 09/05/16 0830  Gross per 24 hour  Intake          4126.25 ml  Output             2025 ml  Net          2101.25 ml     Laboratory CBC    Component Value Date/Time   WBC 15.3 (H) 09/05/2016 0430   HGB 8.9 (L) 09/05/2016 0430   HCT 27.0 (L) 09/05/2016 0430   PLT 173 09/05/2016 0430    BMET    Component Value Date/Time   NA 140 09/04/2016 0357   K 3.1 (L) 09/04/2016 0357   CL 112 (H) 09/04/2016 0357   CO2 22 09/04/2016 0357   GLUCOSE 235 (H) 09/04/2016 0357   BUN 16 09/04/2016 0357   CREATININE 1.09 09/04/2016 0357   CALCIUM 8.1 (L) 09/04/2016 0357   GFRNONAA >60 09/04/2016 0357   GFRAA >60 09/04/2016 0357    COAG Lab Results  Component Value Date   INR 1.27 09/01/2016   INR 1.28 08/31/2016   INR 1.23 08/30/2016   No results found for: PTT  Antibiotics Anti-infectives    Start     Dose/Rate Route Frequency Ordered Stop   09/04/16 1300   piperacillin-tazobactam (ZOSYN) IVPB 3.375 g     3.375 g 12.5 mL/hr over 240 Minutes Intravenous Every 8 hours 09/04/16 1249     09/04/16 1247  vancomycin (VANCOCIN) 500 mg in sodium chloride 0.9 % 100 mL IVPB     500 mg 100 mL/hr over 60 Minutes Intravenous Every 12 hours 09/04/16 1248     09/04/16 0800  piperacillin-tazobactam (ZOSYN) IVPB 3.375 g  Status:  Discontinued     3.375 g 12.5 mL/hr over 240 Minutes Intravenous Every 8 hours 09/03/16 2355 09/04/16 1249   09/04/16 0000  vancomycin (VANCOCIN) 500 mg in sodium chloride 0.9 % 100 mL IVPB  Status:  Discontinued     500 mg 100 mL/hr over 60 Minutes Intravenous Every 12 hours 09/03/16 2355 09/04/16 1248   09/04/16 0000  piperacillin-tazobactam (ZOSYN) IVPB 3.375 g     3.375 g 100 mL/hr over 30 Minutes Intravenous STAT 09/03/16 2355 09/04/16 0142   08/26/16 1000  levofloxacin (LEVAQUIN) tablet 750 mg  Status:  Discontinued     750 mg Oral Daily Apr 12, 2016 1624 08/26/16 0809   Apr 12, 2016  1630  cefUROXime (ZINACEF) 1.5 g in dextrose 5 % 50 mL IVPB     1.5 g 100 mL/hr over 30 Minutes Intravenous Every 12 hours 19-Sep-2016 1624 08/26/16 0430   09-19-2016 0705  dextrose 5 % with cefUROXime (ZINACEF) ADS Med    Comments:  Scronce, Trina   : cabinet override      Sep 19, 2016 0705 2016-09-19 0842   September 19, 2016 0604  cefUROXime (ZINACEF) 1.5 g in dextrose 5 % 50 mL IVPB     1.5 g 100 mL/hr over 30 Minutes Intravenous 30 min pre-op 09-19-16 0604 09-19-16 0842       V. Charlena Cross, M.D. [facility] Office: [phone] Pager:  [phone]

## 2016-09-05 NOTE — Evaluation (Signed)
Clinical/Bedside Swallow Evaluation Patient Details  Name: [name] MRN: [id number] Date of Birth: 08-05-1941  Today's Date: 09/05/2016 Time: SLP Start Time (ACUTE ONLY): 1355 SLP Stop Time (ACUTE ONLY): 1414 SLP Time Calculation (min) (ACUTE ONLY): 19 min  Past Medical History:  Past Medical History:  Diagnosis Date  . AAA (abdominal aortic aneurysm) (HCC)   . Anemia   . Anxiety   . Arthritis    Gout  . Atrial fibrillation (HCC)   . Barrett esophagus   . Carotid artery occlusion    Carotid endarterectomy 2002 and 2004  . COPD (chronic obstructive pulmonary disease) (HCC)   . Diabetes mellitus   . Diverticulosis   . DVT (deep venous thrombosis) (HCC)   . Erectile dysfunction   . GERD (gastroesophageal reflux disease)   . Gout   . Hiatal hernia   . History of stress test    March 2012 post stress ejection fraction 56%, negative for ischemia.  Last 2-D echocardiogram March 2012 younger than 55% mild concentric left ventricular hypertrophy grade 1 diastolic dysfunction. Mildly dilated left atrium. 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  . Hyperlipidemia   . Hypertension   . Kidney stone   . Lumbar stenosis   . PAD (peripheral artery disease) (HCC)    Fem-Fem by-pass graft 1991. redo patch angioplasty in 2000 and 2004.  LEA dopplers: 04/2012- R-ABI 1.34, L-ABI 0.97, patent Left lower extremity graft.  . Pancreatitis   . Stroke [facility](HCC) March 2016   Lihgt Stroke   Past Surgical History:  Past Surgical History:  Procedure Laterality Date  . CAROTID ENDARTERECTOMY  2002   Left CEA  . CAROTID ENDARTERECTOMY  2007   Left CEA redo  . CATARACT EXTRACTION Left 02-02-95  . CATARACT EXTRACTION Right 04-20-95  . colonoscopy    . IR GENERIC HISTORICAL  06/13/2016   IR INFUSION THROMBOL ARTERIAL INITIAL (MS) 06/13/2016 Richarda OverlieAdam Henn, MD [facility]  . IR GENERIC HISTORICAL  06/13/2016   IR ANGIOGRAM EXTREMITY LEFT 06/13/2016 Richarda OverlieAdam Henn, MD [facility]  . IR GENERIC HISTORICAL  06/13/2016   IR  US GUIDE VASC ACCESS LEFT 06/13/2016 Richarda OverlieAdam Henn, MD [facility]  . IR GENERIC HISTORICAL  06/14/2016   IR THROMB F/U EVAL ART/VEN FINAL DAY (MS) 06/14/2016 Oley Balmaniel Hassell, MD [facility]  . LUMBAR DISC SURGERY  1980  . PR VEIN BYPASS GRAFT,AORTO-FEM-POP  2007   Left Fem-post. tibial  . PR VEIN BYPASS GRAFT,AORTO-FEM-POP  1999-2004   numerous revision Left Fem-pop  . THORACIC AORTIC ENDOVASCULAR STENT GRAFT N/A 09/14/2016   Procedure: THORACIC AORTIC ENDOVASCULAR STENT GRAFT WITH RETROPERITONEAL EXPOSURE;  Surgeon: Nada LibmanVance W Brabham, MD;  [facility];  Service: Vascular;  Laterality: N/A;   HPI:  75 yo male smoker admitted 10/11 for planned endovascular thoracic aortic stent. Post op course complicated by HTN, AKI, ileus. He developed altered mental status, progressive HTN and fever 10/21. Pt has a PMH of GERD with Barrett's esophagus, hiatal hernia, gout, CVA, COPD. MRI is negative. Pt has right basilar infiltrate on CXR.    Assessment / Plan / Recommendation Clinical Impression  Pt demonstrates signs concerning for oral and oropharyngeal dysphagia in setting of generalized weakness, deconditioning and bibasilar infiltrates on CXR.  Pt spends a prolonged period manipulating liquids with suspected premature spillage to the pharynx before swallow is initiated. Swallow is sluggish and hyolaryngeal elevation subjectively feels weak. Vocal quality after the swallow is both harsh and questionably wet. Pt did not cough during assessment, but suspect there  are at least pharyngeal residuals and risk of silent aspiration. Recommend objective testing to determine safety with PO prior to initiating diet. MBS recommended to visualize esophageal phase given history of GERD.     Aspiration Risk  Severe aspiration risk    Diet Recommendation NPO        Other  Recommendations Oral Care Recommendations: Oral care QID   Follow up Recommendations        Frequency and Duration            Prognosis         Swallow Study   General HPI: 75 yo male smoker admitted 10/11 for planned endovascular thoracic aortic stent. Post op course complicated by HTN, AKI, ileus. He developed altered mental status, progressive HTN and fever 10/21. Pt has a PMH of GERD with Barrett's esophagus, hiatal hernia, gout, CVA, COPD. MRI is negative. Pt has right basilar infiltrate on CXR.  Type of Study: Bedside Swallow Evaluation Previous Swallow Assessment: none Diet Prior to this Study: NPO Temperature Spikes Noted: No Respiratory Status: Nasal cannula Behavior/Cognition: Alert;Cooperative;Requires cueing;Pleasant mood Oral Cavity Assessment: Within Functional Limits Oral Care Completed by SLP: No Oral Cavity - Dentition: Edentulous;Dentures, not available Vision: Functional for self-feeding Self-Feeding Abilities: Needs assist Patient Positioning: Upright in bed Baseline Vocal Quality: Low vocal intensity Volitional Cough: Weak Volitional Swallow: Able to elicit    Oral/Motor/Sensory Function Overall Oral Motor/Sensory Function: Generalized oral weakness (tremulous, dysarthric)   Ice Chips Ice chips: Impaired Presentation: Spoon Oral Phase Functional Implications: Prolonged oral transit;Oral holding Pharyngeal Phase Impairments: Decreased hyoid-laryngeal movement   Thin Liquid Thin Liquid: Impaired Presentation: Cup;Straw;Self Fed Oral Phase Impairments: Reduced labial seal Oral Phase Functional Implications: Prolonged oral transit;Oral holding Pharyngeal  Phase Impairments: Suspected delayed Swallow;Decreased hyoid-laryngeal movement;Multiple swallows;Wet Vocal Quality    Nectar Thick Nectar Thick Liquid: Not tested   Honey Thick Honey Thick Liquid: Not tested   Puree Puree: Impaired Presentation: Spoon Oral Phase Functional Implications: Prolonged oral transit   Solid   GO   Solid: Not tested       Harlon Ditty, MA CCC-SLP [id number]  Kaytlin Burklow, Riley Nearing 09/05/2016,2:37  PM

## 2016-09-05 NOTE — Progress Notes (Signed)
      [address]       [place], [id number]             [phone]        CARDIOTHORACIC SURGERY PROGRESS NOTE   R11 Days Post-Op Procedure(s) (LRB): THORACIC AORTIC ENDOVASCULAR STENT GRAFT WITH RETROPERITONEAL EXPOSURE (N/A)  Subjective: More alert today.  Still very lethargic and weak.  Garbled speech.  Moving all 4 extremities.  Objective: Vital signs: BP Readings from Last 1 Encounters:  09/05/16 (!) 148/53   Pulse Readings from Last 1 Encounters:  09/05/16 75   Resp Readings from Last 1 Encounters:  09/05/16 (!) 22   Temp Readings from Last 1 Encounters:  09/05/16 99.3 F (37.4 C) (Oral)    Hemodynamics:    Physical Exam:  Rhythm:   sinus  Breath sounds: Diminished at bases  Heart sounds:  RRR  Incisions:  Clean and dry  Abdomen:  Soft, non-distended, non-tender  Extremities:  Warm, well-perfused    Intake/Output from previous day: 10/21 0701 - 10/22 0700 In: 3916.3 [I.V.:3566.3; IV Piggyback:350] Out: 2025 [Urine:2025] Intake/Output this shift: Total I/O In: 660 [P.O.:60; I.V.:450; IV Piggyback:150] Out: 150 [Urine:150]  Lab Results:  CBC: Recent Labs  09/04/16 1150 09/05/16 0430  WBC 17.3* 15.3*  HGB 10.4* 8.9*  HCT 31.0* 27.0*  PLT 203 173    BMET:  Recent Labs  09/04/16 0357 09/05/16 0900  NA 140 143  K 3.1* 3.2*  CL 112* 115*  CO2 22 22  GLUCOSE 235* 228*  BUN 16 21*  CREATININE 1.09 1.11  CALCIUM 8.1* 8.0*     PT/INR:  No results for input(s): LABPROT, INR in the last 72 hours.  CBG (last 3)   Recent Labs  09/05/16 0430 09/05/16 0431 09/05/16 0719  GLUCAP >600* >600* 240*    ABG    Component Value Date/Time   PHART 7.465 (H) 09/04/2016 0800   PCO2ART 31.5 (L) 09/04/2016 0800   PO2ART 64.2 (L) 09/04/2016 0800   HCO3 22.1 09/04/2016 0800   TCO2 25 Nov 12, 2016 1714   ACIDBASEDEF 1.0 09/04/2016 0800   O2SAT 91.6 09/04/2016 0800    CXR: PORTABLE CHEST 1 VIEW  COMPARISON:   09/04/2016  FINDINGS: Aortic stent graft is again noted and stable. Right-sided PICC line is stable as well. Cardiac shadow is within normal limits. Increasing right basilar infiltrate is seen. Some left retrocardiac density is now seen new from the prior exam. No bony abnormality is seen.  IMPRESSION: Bibasilar infiltrates.   Electronically Signed   By: Alcide CleverMark  Lukens M.D.   On: 09/05/2016 09:03  Assessment/Plan: S/P Procedure(s) (LRB): THORACIC AORTIC ENDOVASCULAR STENT GRAFT WITH RETROPERITONEAL EXPOSURE (N/A)  Plan per VVS, CCM and Neurology.  Purcell Nailslarence H Bell Carbo, MD 09/05/2016 11:05 AM

## 2016-09-05 NOTE — Progress Notes (Signed)
PARENTERAL NUTRITION CONSULT NOTE - FOLLOW UP  Pharmacy Consult for TPN Indication: Prolonged ileus  Allergies  Allergen Reactions  . Lopressor [Metoprolol Tartrate] Palpitations    LOPRESSOR bothers the patient.  . Niacin Swelling    FACIAL EDEMA    Patient Measurements: Height: 5\' 9"  (175.3 cm) (Simultaneous filing. User may not have seen previous data.) Weight: 132 lb 11.5 oz (60.2 kg) IBW/kg (Calculated) : 70.7   Vital Signs: Temp: 99.3 F (37.4 C) (10/22 0723) Temp Source: Oral (10/22 0723) BP: 150/51 (10/22 0830) Pulse Rate: 90 (10/22 0830) Intake/Output from previous day: 10/21 0701 - 10/22 0700 In: 3916.3 [I.V.:3566.3; IV Piggyback:350] Out: 2025 [Urine:2025] Intake/Output from this shift: No intake/output data recorded.  Labs:  Recent Labs  09/03/16 0500 09/04/16 1150 09/05/16 0430  WBC 15.3* 17.3* 15.3*  HGB 9.1* 10.4* 8.9*  HCT 27.5* 31.0* 27.0*  PLT 203 203 173     Recent Labs  09/03/16 0500 09/03/16 0726 09/04/16 0357  NA 138 141 140  K 4.1 3.3* 3.1*  CL 113* 115* 112*  CO2 19* 20* 22  GLUCOSE 529* 189* 235*  BUN 17 19 16   CREATININE 1.12 1.10 1.09  CALCIUM 7.9* 8.2* 8.1*  MG 1.8 1.6*  --   PHOS 3.2 1.4*  --   PROT 5.3* 6.0*  --   ALBUMIN 2.1* 2.4*  --   AST 27 31  --   ALT 34 37  --   ALKPHOS 50 55  --   BILITOT 1.0 1.2  --   PREALBUMIN 5.6*  --   --   TRIG 190*  --   --    Estimated Creatinine Clearance: 49.9 mL/min (by C-G formula based on SCr of 1.09 mg/dL).    Recent Labs  09/05/16 0430 09/05/16 0431 09/05/16 0719  GLUCAP >600* >600* 240*    Medications:  Scheduled:  . amLODipine  10 mg Oral Q supper  . arformoterol  15 mcg Nebulization BID  . budesonide (PULMICORT) nebulizer solution  0.25 mg Nebulization BID  . chlorhexidine  15 mL Mouth Rinse BID  . cloNIDine  0.3 mg Transdermal Weekly  . enoxaparin (LOVENOX) injection  60 mg Subcutaneous Q12H  . famotidine (PEPCID) IV  20 mg Intravenous Q12H  . insulin  aspart  0-15 Units Subcutaneous Q4H  . ipratropium-albuterol  3 mL Nebulization Q6H  . losartan  50 mg Oral Q lunch  . mouth rinse  15 mL Mouth Rinse q12n4p  . metoCLOPramide (REGLAN) injection  10 mg Intravenous Q6H  . piperacillin-tazobactam (ZOSYN)  IV  3.375 g Intravenous Q8H  . propafenone  225 mg Oral BID  . sodium chloride flush  10-40 mL Intracatheter Q12H  . vancomycin  500 mg Intravenous Q12H  . Vitamin D (Ergocalciferol)  50,000 Units Oral Q7 days    Insulin requirements in the past 24 hours: 11units Mod SSI + 20 units in TPN  Assessment: [name] who underwent a complex thoracic aneurysm repair with stent placement on 10/11. The patient is noted to have an ileus post-op and pharmacy has been consulted to start TPN for nutrition.   GI: Post-op ileus. Pulled out NG 10/20 during episode confusion; replace if develop N/V. LBM 10/19. Decr bowel sounds, (-)flatus, (-)N/V.  Abd soft, NT.   Remains NPO, but receiving po meds. Reglan IV, Pepcid IV Endo:Hx DM. CBGs/24h: 159-240. Noted cbg > 600 x2, these were line draws with TPN skewing results per d/w RN; asked for finger sticks for CBGs.  Lytes: K 3.2- 4 runs yest + 1 this AM, Mg 1.4, Phos 2.8  Renal:SCr 1.1- stable, CrCl~50 ml/min. UOP 3000mL, I/O 417 Pulm: Now on Caban-4L, up from 1.5L yest Cards: Hx Afib - full dose Enox + Warf. Hypertensive & receiving prn Hydralazine/Metoprolol & NTG, HR brady-tachy.  Amlodipine, Clonidine TTS, Hydralazine prn, Cozaar, Metoprolol prn, Propafenone Hepatobil:AST/ALT mildly elevated on 10/16, Tbili wnl. TG 190, Palb 5.6- indicating nutritional depletion  Neuro:  Transferred to ICU 10/21 2nd AMS, neuro consult.  EEG- diffuse encephalopathy, no sz activity.  MRI- mod atrophy & old infarcts ZO:[id number]:Afebrile, WBC 15.3- trending up, on Vanc/Zosyn (10/21 >> ) for R/O aspiration pna  Best Practices:Enox 1mg /kg TPN Access:10/18 TPN start date:10/19 >>  Nutritional Goals (per RD  recommendation on 10/12): KCal:1800-2000 Protein: 80-95g  Goal Rate:  Clinimix E 5/20at 8365ml/hr and 20% lipids at 10 ml/hr- providing 1853kcal and 78g protein, meeting 100% caloric goals and 98% protein goals  Current Nutrition: NPO  Plan: Cont Clinimix-E 5/20 at 4065ml/hr and Intralipid 4510ml/hr  Add Trace elements to TPN, no MVI d/t facial swelling with Niacin Increase insulin in TPN to 30 units Moderate SSI K runs x 4 Mg 2g x 1 TPN labs qMon/Thurs F/U resolution of ileus and start of po intake   Marisue HumbleKendra Narelle Schoening, PharmD Clinical Pharmacist [facility]

## 2016-09-05 NOTE — Progress Notes (Addendum)
MRI of the brain without acute intracranial process. EEG without epileptiform activity. Spoke with attending, patient with likely pneumonia, elevated WBCs, febrile, Increasing right basilar infiltrate on cxr.  However this morning patient was able to tell me his name and follow simple commands appears his mental status is improving. Will sign off with low threshold to re-consult us if patient does not continue to improve or worsens.  [name] DeanAntonia Ila Landowski, MD Neurohospitalist [phone]

## 2016-09-05 NOTE — Progress Notes (Signed)
PCCM PROGRESS NOTE  Admission date: 09-08-16 Consult date: 09/04/2016 Referring provider: Dr. Myra Gianotti, VVS  CC: altered mental status  Subjective: Slept well overnight.    Vital signs: BP (!) 150/51   Pulse 95   Temp 98.2 F (36.8 C) (Oral)   Resp (!) 24   Ht 5\' 9"  (1.753 m) Comment: Simultaneous filing. User may not have seen previous data.  Wt 132 lb 11.5 oz (60.2 kg)   SpO2 98%   BMI 19.60 kg/m   Intake/output: I/O last 3 completed shifts: In: 5551.3 [I.V.:5001.3; IV Piggyback:550] Out: 4375 [Urine:4375]  General: thin Neuro: follows simple commands HEENT: no stridor Cardiac: irregular, no murmur Chest: no wheeze Abd: soft, non tender Ext: no edema Skin: no rashes   CMP Latest Ref Rng & Units 09/04/2016 09/03/2016 09/03/2016  Glucose 65 - 99 mg/dL 161(W) 960(A) 540(JW)  BUN 6 - 20 mg/dL 16 19 17   Creatinine 0.61 - 1.24 mg/dL 1.19 1.47 8.29  Sodium 135 - 145 mmol/L 140 141 138  Potassium 3.5 - 5.1 mmol/L 3.1(L) 3.3(L) 4.1  Chloride 101 - 111 mmol/L 112(H) 115(H) 113(H)  CO2 22 - 32 mmol/L 22 20(L) 19(L)  Calcium 8.9 - 10.3 mg/dL 8.1(L) 8.2(L) 7.9(L)  Total Protein 6.5 - 8.1 g/dL - 6.0(L) 5.3(L)  Total Bilirubin 0.3 - 1.2 mg/dL - 1.2 1.0  Alkaline Phos 38 - 126 U/L - 55 50  AST 15 - 41 U/L - 31 27  ALT 17 - 63 U/L - 37 34    CBC Latest Ref Rng & Units 09/05/2016 09/04/2016 09/03/2016  WBC 4.0 - 10.5 K/uL 15.3(H) 17.3(H) 15.3(H)  Hemoglobin 13.0 - 17.0 g/dL 5.6(O) 10.4(L) 9.1(L)  Hematocrit 39.0 - 52.0 % 27.0(L) 31.0(L) 27.5(L)  Platelets 150 - 400 K/uL 173 203 203    ABG    Component Value Date/Time   PHART 7.465 (H) 09/04/2016 0800   PCO2ART 31.5 (L) 09/04/2016 0800   PO2ART 64.2 (L) 09/04/2016 0800   HCO3 22.1 09/04/2016 0800   TCO2 25 Sep 08, 2016 1714   ACIDBASEDEF 1.0 09/04/2016 0800   O2SAT 91.6 09/04/2016 0800    CBG (last 3)   Recent Labs  09/05/16 0430 09/05/16 0431 09/05/16 0719  GLUCAP >600* >600* 240*     Imaging: Ct  Head Wo Contrast  Result Date: 09/03/2016 CLINICAL DATA:  Sudden onset of somnolence, slurred speech and difficulty speaking. EXAM: CT HEAD WITHOUT CONTRAST TECHNIQUE: Contiguous axial images were obtained from the base of the skull through the vertex without intravenous contrast. COMPARISON:  MRI 12/16/2014 of the head FINDINGS: Brain: There is superficial and central atrophy. Chronic small vessel ischemic disease of periventricular white matter. Chronic left frontal and right centrum semiovale infarcts are noted with volume loss. Small chronic basal ganglial lacunes are seen bilaterally. No acute intracranial hemorrhage, large vascular territory infarction no acute loss of gray - white matter distinction is identified. No extra-axial fluid collections or focal mass lesions are seen. Vascular: Atheromatous calcifications of the both vertebral arteries and cavernous carotids bilaterally. Skull: No acute osseous abnormality. Sinuses/Orbits: Under pneumatized mastoids and frontal sinus. Minimal sphenoid and ethmoid sinus mucosal thickening. Bilateral lens replacement surgical changes are noted. Other: None IMPRESSION: Superficial and central atrophy. Chronic left frontal and right centrum semiovale infarct with volume loss. Small bilateral basal ganglial chronic lacune or infarcts. Chronic small vessel ischemic disease of periventricular white matter. No acute intracranial abnormality. Electronically Signed   By: Tollie Eth M.D.   On: 09/03/2016 19:01   Mr  Brain W Wo Contrast  Result Date: 09/05/2016 CLINICAL DATA:  Acute encephalopathy. History of hypertension, diabetes, atrial fibrillation, carotid endarterectomy. EXAM: MRI HEAD WITHOUT AND WITH CONTRAST TECHNIQUE: Multiplanar, multiecho pulse sequences of the brain and surrounding structures were obtained without and with intravenous contrast. CONTRAST:  10mL MULTIHANCE GADOBENATE DIMEGLUMINE 529 MG/ML IV SOLN COMPARISON:  CT HEAD September 03, 2016 and MRI  of the head December 16, 2014. FINDINGS: Multiple sequences are mildly or moderately motion degraded. INTRACRANIAL CONTENTS: No reduced diffusion to suggest acute ischemia. No susceptibility artifact to suggest hemorrhage. Moderate ventriculomegaly on the basis of global parenchymal brain volume loss. Old small RIGHT cerebellar infarct. Old LEFT mesial pontine infarct. LEFT frontal lobe encephalomalacia compatible with infarct. Old small RIGHT centrum semiovale/corona radiata infarct. Old RIGHT thalamus lacunar infarct. A few patchy supratentorial white matter FLAIR T2 hyperintensities consistent mild chronic small vessel ischemic disease. No midline shift, mass effect or masses. T2 bright bifrontal extra-axial fluid collections measure up to 6 mm, relatively unchanged. Prominent posterior fossa extra-axial fluid space. VASCULAR: Similar slow flow versus occluded LEFT vertebral artery. Eccentric intimal thickening basilar artery. Major intracranial vascular flow voids preserved at skull base. SKULL AND UPPER CERVICAL SPINE: No abnormal sellar expansion. No suspicious calvarial bone marrow signal. Craniocervical junction maintained. SINUSES/ORBITS: Small bilateral mastoid effusions. Imaged paranasal sinuses are well-aerated.Status post bilateral ocular lens implants. The included ocular globes and orbital contents are non-suspicious. OTHER: Patient is edentulous. IMPRESSION: No acute intracranial process on this motion degraded examination. Chronic changes including: Moderate global brain atrophy, multiple old supra- and infratentorial infarcts. Electronically Signed   By: Awilda Metro M.D.   On: 09/05/2016 02:34   Dg Chest Port 1 View  Result Date: 09/04/2016 CLINICAL DATA:  Followup pleural effusion EXAM: PORTABLE CHEST 1 VIEW COMPARISON:  09/03/2016 FINDINGS: Cardiac shadow is again at the upper limits of normal in size. Thoracic aortic stent graft is again seen and stable. The nasogastric catheter has  been removed. Right PICC line is again noted and stable. The lungs are well aerated bilaterally. Some right basilar atelectasis is seen. The pleural effusions seen previously has decreased in the interval from the prior exam. There is also been some improvement in the degree of vascular congestion. IMPRESSION: Improved vascular congestion and right pleural effusion. Some right basilar atelectasis remains. Electronically Signed   By: Alcide Clever M.D.   On: 09/04/2016 07:34   Dg Abd Portable 1v  Result Date: 09/04/2016 CLINICAL DATA:  Ileus EXAM: PORTABLE ABDOMEN - 1 VIEW COMPARISON:  09/03/2016 FINDINGS: Nasogastric catheter has been removed. Scattered large and small bowel gas is noted. There remains small bowel dilatation although it has improved somewhat in the interval from the prior exam. No free air is seen. No other focal abnormality is noted. Degenerative changes of lumbar spine are seen. IMPRESSION: Persistent but improved small bowel dilatation. Slight increase in the degree of colonic gas is noted. Changes may represent ileus versus partial small bowel obstruction. Correlation with the clinical exam is recommended. Electronically Signed   By: Alcide Clever M.D.   On: 09/04/2016 07:36   Studies: CT head 10/20 >> atrophy, old infarcts, chronic small vessel ischemic disease EEG 10/21 >> diffuse encephalopathy  MRI brain 10/21 >> moderate atrophy, old infarcts  Cultures: Blood 10/21 >>  Antibiotics: Vancomycin 10/21 >> Zosyn 10/21 >>   Significant events: 10/11  Admit for endovascular stent of thoracic aortic aneurysm  10/14  Ileus, HTN 10/20  Pt pulled NGT out  10/21  Encephalopathy   Summary: 75 yo male smoker admitted 10/11 for planned endovascular thoracic aortic stent.  Post op course complicated by HTN, AKI, ileus.  He developed altered mental status, progressive HTN and fever 10/21 and PCCM was asked to assist with medical management in ICU.  PMHx of Anemia, GERD with  Barrett's esophagus, Hiatal hernia, Anxiety, Gout, Chronic back pain, CVA, A fib on coumadin, CAD, DM, COPD, DVT.   Assessment/plan:  Acute metabolic encephalopathy. - monitor mental status  Fever. - Day 3 of Abx - f/u Cx results  HTN. Hx of A fib. - cardene gtt for goal SBP < 160 - continue catapres patch - norvasc, cozaar, rythmol on hold while NPO  S/p endovascular stent to thoracic aorta. - post-op care per VVS  Ileus >> improved. Dysphagia. Nutrition. - speech to assess swallowing - continue reglan for now - TPN per VVS >> hopefully can transition off this soon  Anemia of critical illness. - f/u CBC  DM type II. - SSI  COPD. - change to brovana, pulmicort and prn duoneb  Resolved issues - lactic acidosis  DVT prophylaxis - Lovenox SUP - Pepcid Goals of care - Full code  CC time 32 minutes.  Coralyn HellingVineet Nasim Garofano, MD [facility] 09/05/2016, 7:54 AM Pager:  [phone] After 3pm call: [phone]

## 2016-09-06 ENCOUNTER — Inpatient Hospital Stay (HOSPITAL_COMMUNITY): Payer: Medicare Other

## 2016-09-06 DIAGNOSIS — R140 Abdominal distension (gaseous): Secondary | ICD-10-CM

## 2016-09-06 DIAGNOSIS — K567 Ileus, unspecified: Secondary | ICD-10-CM

## 2016-09-06 DIAGNOSIS — T17908A Unspecified foreign body in respiratory tract, part unspecified causing other injury, initial encounter: Secondary | ICD-10-CM

## 2016-09-06 LAB — COMPREHENSIVE METABOLIC PANEL
ALT: 22 U/L (ref 17–63)
ANION GAP: 7 (ref 5–15)
AST: 24 U/L (ref 15–41)
Albumin: 1.9 g/dL — ABNORMAL LOW (ref 3.5–5.0)
Alkaline Phosphatase: 57 U/L (ref 38–126)
BILIRUBIN TOTAL: 0.8 mg/dL (ref 0.3–1.2)
BUN: 23 mg/dL — ABNORMAL HIGH (ref 6–20)
CO2: 20 mmol/L — ABNORMAL LOW (ref 22–32)
Calcium: 7.9 mg/dL — ABNORMAL LOW (ref 8.9–10.3)
Chloride: 116 mmol/L — ABNORMAL HIGH (ref 101–111)
Creatinine, Ser: 1.25 mg/dL — ABNORMAL HIGH (ref 0.61–1.24)
GFR, EST NON AFRICAN AMERICAN: 55 mL/min — AB (ref >60)
Glucose, Bld: 102 mg/dL — ABNORMAL HIGH (ref 65–99)
POTASSIUM: 3.8 mmol/L (ref 3.5–5.1)
Sodium: 143 mmol/L (ref 135–145)
TOTAL PROTEIN: 5.4 g/dL — AB (ref 6.5–8.1)

## 2016-09-06 LAB — GLUCOSE, CAPILLARY
GLUCOSE-CAPILLARY: 114 mg/dL — AB (ref 65–99)
Glucose-Capillary: 108 mg/dL — ABNORMAL HIGH (ref 65–99)
Glucose-Capillary: 152 mg/dL — ABNORMAL HIGH (ref 65–99)
Glucose-Capillary: 201 mg/dL — ABNORMAL HIGH (ref 65–99)
Glucose-Capillary: 244 mg/dL — ABNORMAL HIGH (ref 65–99)
Glucose-Capillary: 95 mg/dL (ref 65–99)

## 2016-09-06 LAB — POCT I-STAT 3, ART BLOOD GAS (G3+)
Acid-base deficit: 5 mmol/L — ABNORMAL HIGH (ref 0.0–2.0)
Bicarbonate: 19.6 mmol/L — ABNORMAL LOW (ref 20.0–28.0)
O2 Saturation: 100 %
TCO2: 21 mmol/L (ref 0–100)
pCO2 arterial: 36.9 mmHg (ref 32.0–48.0)
pH, Arterial: 7.338 — ABNORMAL LOW (ref 7.350–7.450)
pO2, Arterial: 272 mmHg — ABNORMAL HIGH (ref 83.0–108.0)

## 2016-09-06 LAB — DIFFERENTIAL
Basophils Absolute: 0 10*3/uL (ref 0.0–0.1)
Basophils Relative: 0 %
EOS ABS: 0.2 10*3/uL (ref 0.0–0.7)
Eosinophils Relative: 1 %
LYMPHS PCT: 8 %
Lymphs Abs: 1.7 10*3/uL (ref 0.7–4.0)
Monocytes Absolute: 1.7 10*3/uL — ABNORMAL HIGH (ref 0.1–1.0)
Monocytes Relative: 8 %
NEUTROS ABS: 17.5 10*3/uL — AB (ref 1.7–7.7)
NEUTROS PCT: 83 %

## 2016-09-06 LAB — CBC
HCT: 26.3 % — ABNORMAL LOW (ref 39.0–52.0)
Hemoglobin: 8.6 g/dL — ABNORMAL LOW (ref 13.0–17.0)
MCH: 30.6 pg (ref 26.0–34.0)
MCHC: 32.7 g/dL (ref 30.0–36.0)
MCV: 93.6 fL (ref 78.0–100.0)
PLATELETS: 175 10*3/uL (ref 150–400)
RBC: 2.81 MIL/uL — AB (ref 4.22–5.81)
RDW: 17.1 % — ABNORMAL HIGH (ref 11.5–15.5)
WBC: 21.1 10*3/uL — AB (ref 4.0–10.5)

## 2016-09-06 LAB — TRIGLYCERIDES
TRIGLYCERIDES: 374 mg/dL — AB (ref <150)
Triglycerides: 75 mg/dL (ref <150)

## 2016-09-06 LAB — PHOSPHORUS: Phosphorus: 3.5 mg/dL (ref 2.5–4.6)

## 2016-09-06 LAB — MAGNESIUM: MAGNESIUM: 1.9 mg/dL (ref 1.7–2.4)

## 2016-09-06 LAB — LACTIC ACID, PLASMA: LACTIC ACID, VENOUS: 1.4 mmol/L (ref 0.5–1.9)

## 2016-09-06 LAB — PROCALCITONIN: PROCALCITONIN: 0.2 ng/mL

## 2016-09-06 MED ORDER — FAT EMULSION 20 % IV EMUL
240.0000 mL | INTRAVENOUS | Status: DC
  Administered 2016-09-06: 240 mL via INTRAVENOUS
  Filled 2016-09-06: qty 250

## 2016-09-06 MED ORDER — IPRATROPIUM-ALBUTEROL 0.5-2.5 (3) MG/3ML IN SOLN: 3.0000 mL | RESPIRATORY_TRACT | Status: DC | PRN

## 2016-09-06 MED ORDER — ETOMIDATE 2 MG/ML IV SOLN
20.0000 mg | Freq: Once | INTRAVENOUS | Status: AC
  Administered 2016-09-06: 20 mg via INTRAVENOUS

## 2016-09-06 MED ORDER — MIDAZOLAM HCL 2 MG/2ML IJ SOLN
INTRAMUSCULAR | Status: AC
  Administered 2016-09-06: 2 mg
  Filled 2016-09-06: qty 2

## 2016-09-06 MED ORDER — PROPOFOL 1000 MG/100ML IV EMUL
INTRAVENOUS | Status: AC
  Administered 2016-09-06: 20 ug/kg/min
  Filled 2016-09-06: qty 100

## 2016-09-06 MED ORDER — TRACE MINERALS CR-CU-MN-SE-ZN 10-1000-500-60 MCG/ML IV SOLN
INTRAVENOUS | Status: AC
  Administered 2016-09-06: 18:00:00 via INTRAVENOUS
  Filled 2016-09-06: qty 1560

## 2016-09-06 MED ORDER — FENTANYL CITRATE (PF) 100 MCG/2ML IJ SOLN
50.0000 ug | INTRAMUSCULAR | Status: AC | PRN
  Administered 2016-09-07 – 2016-09-09 (×3): 50 ug via INTRAVENOUS
  Filled 2016-09-06 (×2): qty 2

## 2016-09-06 MED ORDER — FENTANYL CITRATE (PF) 100 MCG/2ML IJ SOLN
50.0000 ug | INTRAMUSCULAR | Status: DC | PRN
  Administered 2016-09-06: 50 ug via INTRAVENOUS
  Filled 2016-09-06 (×2): qty 2

## 2016-09-06 MED ORDER — PROPOFOL 1000 MG/100ML IV EMUL
0.0000 ug/kg/min | INTRAVENOUS | Status: DC
  Administered 2016-09-06: 25 ug/kg/min via INTRAVENOUS
  Administered 2016-09-07 (×2): 20 ug/kg/min via INTRAVENOUS
  Administered 2016-09-08: 15 ug/kg/min via INTRAVENOUS
  Administered 2016-09-08 – 2016-09-09 (×2): 20 ug/kg/min via INTRAVENOUS
  Filled 2016-09-06 (×6): qty 100

## 2016-09-06 MED ORDER — FENTANYL CITRATE (PF) 100 MCG/2ML IJ SOLN
INTRAMUSCULAR | Status: AC
  Administered 2016-09-06: 100 ug
  Filled 2016-09-06: qty 2

## 2016-09-06 NOTE — Progress Notes (Signed)
PCCM PROGRESS NOTE  Admission date: 04/24/2016 Consult date: 09/04/2016 Referring provider: Dr. Myra GianottiBrabham, VVS  CC: altered mental status  Subjective: Asp event likely resp distress O2 needs increased Desaturating, required 100% Less awake  Vital signs: BP (!) 154/54   Pulse (!) 105   Temp 100.1 F (37.8 C)   Resp (!) 29   Ht 5\' 9"  (1.753 m) Comment: Simultaneous filing. User may not have seen previous data.  Wt 60.2 kg (132 lb 11.5 oz)   SpO2 96%   BMI 19.60 kg/m   Intake/output: I/O last 3 completed shifts: In: 7073.8 [P.O.:60; I.V.:6013.8; IV Piggyback:1000] Out: 2115 [Urine:2115]  General: thin, distress Neuro: follows simple commands HEENT: no stridor, ronchi in neck Cardiac: s1 s2 rrt no murmur Chest ronchi Abd: soft, non tender, distended mild, BS low  Ext: no edema Skin: no rashes   CMP Latest Ref Rng & Units 09/06/2016 09/05/2016 09/04/2016  Glucose 65 - 99 mg/dL 045(W102(H) 098(J228(H) 191(Y235(H)  BUN 6 - 20 mg/dL 78(G23(H) 95(A21(H) 16  Creatinine 0.61 - 1.24 mg/dL 2.13(Y1.25(H) 8.651.11 7.841.09  Sodium 135 - 145 mmol/L 143 143 140  Potassium 3.5 - 5.1 mmol/L 3.8 3.2(L) 3.1(L)  Chloride 101 - 111 mmol/L 116(H) 115(H) 112(H)  CO2 22 - 32 mmol/L 20(L) 22 22  Calcium 8.9 - 10.3 mg/dL 7.9(L) 8.0(L) 8.1(L)  Total Protein 6.5 - 8.1 g/dL 6.9(G5.4(L) - -  Total Bilirubin 0.3 - 1.2 mg/dL 0.8 - -  Alkaline Phos 38 - 126 U/L 57 - -  AST 15 - 41 U/L 24 - -  ALT 17 - 63 U/L 22 - -    CBC Latest Ref Rng & Units 09/06/2016 09/05/2016 09/04/2016  WBC 4.0 - 10.5 K/uL 21.1(H) 15.3(H) 17.3(H)  Hemoglobin 13.0 - 17.0 g/dL 2.9(B8.6(L) 8.9(L) 10.4(L)  Hematocrit 39.0 - 52.0 % 26.3(L) 27.0(L) 31.0(L)  Platelets 150 - 400 K/uL 175 173 203    ABG    Component Value Date/Time   PHART 7.465 (H) 09/04/2016 0800   PCO2ART 31.5 (L) 09/04/2016 0800   PO2ART 64.2 (L) 09/04/2016 0800   HCO3 22.1 09/04/2016 0800   TCO2 25 04/24/2016 1714   ACIDBASEDEF 1.0 09/04/2016 0800   O2SAT 91.6 09/04/2016 0800    CBG  (last 3)   Recent Labs  09/05/16 1923 09/05/16 2323 09/06/16 0313  GLUCAP 154* 131* 95     Imaging: Dg Abd 1 View  Result Date: 09/05/2016 CLINICAL DATA:  Abdominal pain EXAM: ABDOMEN - 1 VIEW COMPARISON:  09/04/2016 FINDINGS: Persistent gaseous distended small bowel loops suspicious for ileus or partial bowel obstruction. IMPRESSION: Persistent gaseous distended small bowel loops suspicious for ileus or bowel obstruction. Electronically Signed   By: Natasha MeadLiviu  Pop M.D.   On: 09/05/2016 11:35   Mr Laqueta JeanBrain W MWWo Contrast  Result Date: 09/05/2016 CLINICAL DATA:  Acute encephalopathy. History of hypertension, diabetes, atrial fibrillation, carotid endarterectomy. EXAM: MRI HEAD WITHOUT AND WITH CONTRAST TECHNIQUE: Multiplanar, multiecho pulse sequences of the brain and surrounding structures were obtained without and with intravenous contrast. CONTRAST:  10mL MULTIHANCE GADOBENATE DIMEGLUMINE 529 MG/ML IV SOLN COMPARISON:  CT HEAD September 03, 2016 and MRI of the head December 16, 2014. FINDINGS: Multiple sequences are mildly or moderately motion degraded. INTRACRANIAL CONTENTS: No reduced diffusion to suggest acute ischemia. No susceptibility artifact to suggest hemorrhage. Moderate ventriculomegaly on the basis of global parenchymal brain volume loss. Old small RIGHT cerebellar infarct. Old LEFT mesial pontine infarct. LEFT frontal lobe encephalomalacia compatible with infarct. Old small RIGHT centrum  semiovale/corona radiata infarct. Old RIGHT thalamus lacunar infarct. A few patchy supratentorial white matter FLAIR T2 hyperintensities consistent mild chronic small vessel ischemic disease. No midline shift, mass effect or masses. T2 bright bifrontal extra-axial fluid collections measure up to 6 mm, relatively unchanged. Prominent posterior fossa extra-axial fluid space. VASCULAR: Similar slow flow versus occluded LEFT vertebral artery. Eccentric intimal thickening basilar artery. Major intracranial  vascular flow voids preserved at skull base. SKULL AND UPPER CERVICAL SPINE: No abnormal sellar expansion. No suspicious calvarial bone marrow signal. Craniocervical junction maintained. SINUSES/ORBITS: Small bilateral mastoid effusions. Imaged paranasal sinuses are well-aerated.Status post bilateral ocular lens implants. The included ocular globes and orbital contents are non-suspicious. OTHER: Patient is edentulous. IMPRESSION: No acute intracranial process on this motion degraded examination. Chronic changes including: Moderate global brain atrophy, multiple old supra- and infratentorial infarcts. Electronically Signed   By: Awilda Metro M.D.   On: 09/05/2016 02:34   Dg Chest Port 1 View  Result Date: 09/06/2016 CLINICAL DATA:  Shortness of Breath EXAM: PORTABLE CHEST 1 VIEW COMPARISON:  09/05/2016 FINDINGS: Aortic stent graft is again identified and stable. Right-sided PICC line is again noted and stable. Right-sided pleural effusion is noted posteriorly. Some interval improved aeration in the right base is noted. Persistent left retrocardiac density is seen. Small left effusion is noted as well. IMPRESSION: Improved aeration in the right base. Persistent right effusion is noted. Stable left retrocardiac density. Electronically Signed   By: Alcide Clever M.D.   On: 09/06/2016 08:06   Dg Chest Port 1 View  Result Date: 09/05/2016 CLINICAL DATA:  Acute encephalopathy EXAM: PORTABLE CHEST 1 VIEW COMPARISON:  09/04/2016 FINDINGS: Aortic stent graft is again noted and stable. Right-sided PICC line is stable as well. Cardiac shadow is within normal limits. Increasing right basilar infiltrate is seen. Some left retrocardiac density is now seen new from the prior exam. No bony abnormality is seen. IMPRESSION: Bibasilar infiltrates. Electronically Signed   By: Alcide Clever M.D.   On: 09/05/2016 09:03   Studies: CT head 10/20 >> atrophy, old infarcts, chronic small vessel ischemic disease EEG 10/21 >>  diffuse encephalopathy  MRI brain 10/21 >> moderate atrophy, old infarcts  Cultures: Blood 10/21 >>  Antibiotics: Vancomycin 10/21 >> Zosyn 10/21 >>   Significant events: 10/11  Admit for endovascular stent of thoracic aortic aneurysm  10/14  Ileus, HTN 10/20  Pt pulled NGT out  10/21  Encephalopathy 10/23- asp event likely, distress, hypoxia  Summary: 75 yo male smoker admitted 10/11 for planned endovascular thoracic aortic stent.  Post op course complicated by HTN, AKI, ileus.  He developed altered mental status, progressive HTN and fever 10/21 and PCCM was asked to assist with medical management in ICU.  PMHx of Anemia, GERD with Barrett's esophagus, Hiatal hernia, Anxiety, Gout, Chronic back pain, CVA, A fib on coumadin, CAD, DM, COPD, DVT.   Assessment/plan:  ARF,. Asp event, hypoxia -has NO airway protection skills, on 100% desaturating, disterss = requires ett Will intubate, 8 cc/kg, 100%, peep 8-10, rate 18, abg to follow Treat ileus Get pcxr  Acute metabolic encephalopathy. - avoid benzo -increase O2, likely needs ett -after ett, consider prop, fent  Fever, asp event likely - continued abx continued vanc, nosocomial exposure  - f/u Cx results, get new sputum -if spikes, change zosyn to ceftaz  HTN. Hx of A fib. Sow SR - cardene gtt for goal SBP < 160, may be able to dc after prop started - continue catapres patch - norvasc,  cozaar, rythmol on hold while NPO, use IV  S/p endovascular stent to thoracic aorta. - post-op care per VVS -abdo soft  Ileus >> improved. Dysphagia. Nutrition. - continue reglan for now - TPN per VVS >> hopefully can transition off this soon,  Get NGT to suciton Repeat kub  Anemia of critical illness. - f/u CBC Lov, crt follow  DM type II. - SSI  COPD. - brovana, pulmicort and prn duoneb  ARF,ATN, hypovolemia? With ileus presume some hypovolemia No lasix Pos balance goals Chem in am   DVT prophylaxis -  Lovenox SUP - Pepcid Goals of care - Full code   I updated dr Cory Roughen, will call wife Ccm time 35 min   Mcarthur Rossetti. Tyson Alias, MD, FACP Pgr: [phone] [facility]

## 2016-09-06 NOTE — Progress Notes (Signed)
      [address]       [place] [id number]             [phone]      Reintubated today for probable aspiration  BP (!) 118/53   Pulse (!) 112   Temp (!) 100.5 F (38.1 C) (Axillary)   Resp (!) 30   Ht 5\' 9"  (1.753 m) Comment: Simultaneous filing. User may not have seen previous data.  Wt 132 lb 11.5 oz (60.2 kg)   SpO2 100%   BMI 19.60 kg/m    Intake/Output Summary (Last 24 hours) at 09/06/16 1900 Last data filed at 09/06/16 1800  Gross per 24 hour  Intake           3081.6 ml  Output             2620 ml  Net            461.6 ml   On vanco and zosyn  Kaileen Bronkema C. Dorris FetchHendrickson, MD [facility] [phone]

## 2016-09-06 NOTE — Progress Notes (Signed)
PARENTERAL NUTRITION CONSULT NOTE - FOLLOW UP  Pharmacy Consult for TPN Indication: Prolonged ileus  Allergies  Allergen Reactions  . Lopressor [Metoprolol Tartrate] Palpitations    LOPRESSOR bothers the patient.  . Niacin Swelling    FACIAL EDEMA    Patient Measurements: Height: 5\' 9"  (175.3 cm) (Simultaneous filing. User may not have seen previous data.) Weight: 132 lb 11.5 oz (60.2 kg) IBW/kg (Calculated) : 70.7   Vital Signs: Temp: 100.2 F (37.9 C) (10/23 0300) Temp Source: Axillary (10/23 0300) BP: 154/54 (10/23 0815) Pulse Rate: 105 (10/23 0815) Intake/Output from previous day: 10/22 0701 - 10/23 0700 In: 4208.8 [P.O.:60; I.V.:3448.8; IV Piggyback:700] Out: 1315 [Urine:1315] Intake/Output from this shift: Total I/O In: 145 [I.V.:145] Out: -   Labs:  Recent Labs  09/04/16 1150 09/05/16 0430 09/06/16 0310  WBC 17.3* 15.3* 21.1*  HGB 10.4* 8.9* 8.6*  HCT 31.0* 27.0* 26.3*  PLT 203 173 175     Recent Labs  09/04/16 0357 09/05/16 0900 09/06/16 0310  NA 140 143 143  K 3.1* 3.2* 3.8  CL 112* 115* 116*  CO2 22 22 20*  GLUCOSE 235* 228* 102*  BUN 16 21* 23*  CREATININE 1.09 1.11 1.25*  CALCIUM 8.1* 8.0* 7.9*  MG  --  1.4* 1.9  PHOS  --  2.8 3.5  PROT  --   --  5.4*  ALBUMIN  --   --  1.9*  AST  --   --  24  ALT  --   --  22  ALKPHOS  --   --  57  BILITOT  --   --  0.8  TRIG  --   --  75   Estimated Creatinine Clearance: 43.5 mL/min (by C-G formula based on SCr of 1.25 mg/dL (H)).    Recent Labs  09/05/16 1923 09/05/16 2323 09/06/16 0313  GLUCAP 154* 131* 95    Insulin requirements in the past 24 hours: 5 units Mod SSI after insulin in TPN increased to 30 units in TPN  Assessment: [name] who underwent a complex thoracic aneurysm repair with stent placement on 10/11. The patient is noted to have an ileus post-op and pharmacy has been consulted to start TPN for nutrition. TPN started 10/21.   GI: Post-op ileus resolved but pt  failed swallow eval 10/22. LBM 10/21. emesis x 2 overnight, no nausea.    scheduled Reglan IV, Pepcid IV Endo:Hx DM. CBGs 95 - 154 after insulin increase to 30 units/bag in TPN, 5 units SSI.    Lytes: K up to 3.8 after 5 runs yest,  Mg 1.9 after 2 gm yesterday; Phos 3.5 Renal:SCr 1.1>1.25 , CrCl~43 ml/min. UOP , I/O [id number], MD wants to limit fluids Pulm: Now on [place]-4L, Cards: Hx Afib - full dose Enox + Warf.  Amlodipine, Clonidine TTS, Hydralazine prn, Cozaar, Metoprolol prn, Propafenone Hepatobil:LFT WNL,  Tbili wnl. TG 190, Palb 5.6- inflammatory state/post-op  Neuro:  Transferred to ICU 10/21 2nd AMS, neuro consult.  EEG- diffuse encephalopathy, no sz activity.  MRI- mod atrophy & old infarcts ZO:[id number], WBC 21.1 - trending up, on Vanc/Zosyn (10/21 >> ) for R/O aspiration pna  Best Practices:Enox 1mg /kg TPN Access:10/18 TPN start date:10/19 >>  Nutritional Goals (per RD recommendation on 10/12): KCal:1800-2000 Protein: 80-95g  Goal Rate:  Clinimix E 5/20at 31ml/hr and 20% lipids at 10 ml/hr- providing 1853kcal and 78g protein, meeting 100% caloric goals and 98% protein goals  Current Nutrition: NPO, TPN  Plan: Cont Clinimix-E 5/20 at 73ml/hr  and Intralipid 3210ml/hr  Add Trace elements to TPN, no MVI d/t facial swelling with Niacin continue insulin in TPN at 30 units Moderate SSI Check labs in AM- bmet, mag, phos F/U barium swallow study results for ability to take po  Herby AbrahamMichelle T. Lelani Garnett, Pharm.D. [phone] 09/06/2016 9:21 AM

## 2016-09-06 NOTE — Procedures (Signed)
Intubation Procedure Note [name] [id number] 09-16-1941  Procedure: Intubation Indications: Respiratory insufficiency  Procedure Details Consent: Unable to obtain consent because of emergent medical necessity. Time Out: Verified patient identification, verified procedure, site/side was marked, verified correct patient position, special equipment/implants available, medications/allergies/relevent history reviewed, required imaging and test results available.  Performed  Maximum sterile technique was used including gloves, gown and hand hygiene.  MAC and 4    Evaluation Hemodynamic Status: BP stable throughout; O2 sats: currently acceptable Patient's Current Condition: stable Complications: No apparent complications Patient did tolerate procedure well. Chest X-ray ordered to verify placement.  CXR: pending.   [name] BucksFEINSTEIN,[name] 09/06/2016   I placed an NGT direct visualized first to decompress stomach, massive amounts bile out, this helped as avoided any aspiration during procedure  Note upon observing, sig bile all over cords and in airway, suctioned Indicating sig asp   I am calling wife to update them  [name] AliasFeinstein, MD, FACP Pgr: [phone] [facility]

## 2016-09-06 NOTE — Progress Notes (Signed)
09/06/2016 0830 During attempt to NTS patient and obtain sputum cx as ordered, pt. Had repeat emesis of bile. Dr. Myra GianottiBrabham contacted and made aware. Orders received to place NGT and if RN unable to place at bedside, place order for IR to place. RN attempted x 2 without success, pt. With further emesis of bile. Order placed for IR to place NGT.  0850 Upon assessment of pt. After episodes of emesis this am, RN noted increased WOB, 02 sat 88% on 55% Venturi mask. PT. With decreased level of consciousness and RR 35, ST 110-120. Dr. Tyson AliasFeinstein with CCM on floor and at bedside to evaluate patient. Dr. Myra GianottiBrabham paged and made aware of need to intubate patient per Dr. Tyson AliasFeinstein due to respiratory distress. Family updated via telephone. Emotional support provided.  Shenica Holzheimer, Blanchard KelchStephanie Ingold

## 2016-09-06 NOTE — Progress Notes (Signed)
Subjective  -   No acute events emesisx2 overnight No nausea  Physical Exam:  Arouses to verbal stimuli Garbled speech Moves extremities abd soft and non-distended       Assessment/Plan:    Pulm:  Increased O2 requirement, breathing looks worse this am.  CXR with persistent Pleural effusion.  Awaiting sputum cx ID:  Continue IV Abx for possible pneumonia Neuro:   MRI negative for acute events.  Unclear reason for garbled speech.  Neirology signed off.  Speech eval today GI:  Keep NPO ID:  Increased WBC, suspect pneumonia, but may need to get CT scan of abdomen if he does not improve\ Renal:  Increased cr today after lasix x2.  Will monitor.  Try to decrease IV fluids (will accept SBP<160 to decrease drip.)  TPN concentrated  Farhaan Mabee, [place] 09/06/2016 8:14 AM --  Vitals:   09/06/16 0700 09/06/16 0715  BP: (!) 149/54 (!) 144/50  Pulse: (!) 107 (!) 103  Resp: (!) 31 (!) 26  Temp:      Intake/Output Summary (Last 24 hours) at 09/06/16 0814 Last data filed at 09/06/16 0600  Gross per 24 hour  Intake          4058.75 ml  Output             1315 ml  Net          2743.75 ml     Laboratory CBC    Component Value Date/Time   WBC 21.1 (H) 09/06/2016 0310   HGB 8.6 (L) 09/06/2016 0310   HCT 26.3 (L) 09/06/2016 0310   PLT 175 09/06/2016 0310    BMET    Component Value Date/Time   NA 143 09/06/2016 0310   K 3.8 09/06/2016 0310   CL 116 (H) 09/06/2016 0310   CO2 20 (L) 09/06/2016 0310   GLUCOSE 102 (H) 09/06/2016 0310   BUN 23 (H) 09/06/2016 0310   CREATININE 1.25 (H) 09/06/2016 0310   CALCIUM 7.9 (L) 09/06/2016 0310   GFRNONAA 55 (L) 09/06/2016 0310   GFRAA >60 09/06/2016 0310    COAG Lab Results  Component Value Date   INR 1.27 09/01/2016   INR 1.28 08/31/2016   INR 1.23 08/30/2016   No results found for: PTT  Antibiotics Anti-infectives    Start     Dose/Rate Route Frequency Ordered Stop   09/04/16 1300  piperacillin-tazobactam (ZOSYN)  IVPB 3.375 g     3.375 g 12.5 mL/hr over 240 Minutes Intravenous Every 8 hours 09/04/16 1249     09/04/16 1247  vancomycin (VANCOCIN) 500 mg in sodium chloride 0.9 % 100 mL IVPB     500 mg 100 mL/hr over 60 Minutes Intravenous Every 12 hours 09/04/16 1248     09/04/16 0800  piperacillin-tazobactam (ZOSYN) IVPB 3.375 g  Status:  Discontinued     3.375 g 12.5 mL/hr over 240 Minutes Intravenous Every 8 hours 09/03/16 2355 09/04/16 1249   09/04/16 0000  vancomycin (VANCOCIN) 500 mg in sodium chloride 0.9 % 100 mL IVPB  Status:  Discontinued     500 mg 100 mL/hr over 60 Minutes Intravenous Every 12 hours 09/03/16 2355 09/04/16 1248   09/04/16 0000  piperacillin-tazobactam (ZOSYN) IVPB 3.375 g     3.375 g 100 mL/hr over 30 Minutes Intravenous STAT 09/03/16 2355 09/04/16 0142   08/26/16 1000  levofloxacin (LEVAQUIN) tablet 750 mg  Status:  Discontinued     750 mg Oral Daily 22-May-2016 1624 08/26/16 0809   22-May-2016  1630  cefUROXime (ZINACEF) 1.5 g in dextrose 5 % 50 mL IVPB     1.5 g 100 mL/hr over 30 Minutes Intravenous Every 12 hours 09-16-2016 1624 08/26/16 0430   16-Sep-2016 0705  dextrose 5 % with cefUROXime (ZINACEF) ADS Med    Comments:  Scronce, Trina   : cabinet override      09-16-16 0705 09/16/2016 0842   2016/09/16 0604  cefUROXime (ZINACEF) 1.5 g in dextrose 5 % 50 mL IVPB     1.5 g 100 mL/hr over 30 Minutes Intravenous 30 min pre-op 09-16-2016 0604 Sep 16, 2016 0842       V. Charlena Cross, M.D. [facility] Office: [phone] Pager:  [phone]

## 2016-09-06 NOTE — Progress Notes (Signed)
Pharmacy Antibiotic Note  [name] is a 75 y.o. male admitted on 08/30/2016 for thoracic stenting on 10/11. Pharmacy has been consulted for Vancomycin and Zosyn dosing for PNA, aspiration event likely per CCM. Intubated 10/23 with respiratory distress. Antibiotics Day #3. SCr bump 1.11>1.25 this AM, CrCl~43, good UOP. Tmax/24h 100.2, wbc up to 21.5.  Plan: -Zosyn 3.375gm IV q8h - doses over 4 hours -Vancomycin 500mg  IV q12h -Will f/u micro data, renal function, and LOT -Monitor renal function closely - may need to hold vanc and check level   Height: 5\' 9"  (175.3 cm) (Simultaneous filing. User may not have seen previous data.) Weight: 132 lb 11.5 oz (60.2 kg) IBW/kg (Calculated) : 70.7  Temp (24hrs), Avg:99.4 F (37.4 C), Min:98.3 F (36.8 C), Max:100.2 F (37.9 C)   Recent Labs Lab 09/02/16 0420 09/03/16 0500 09/03/16 0726 09/04/16 0357 09/04/16 1150 09/04/16 1431 09/05/16 0238 09/05/16 0430 09/05/16 0900 09/06/16 0310  WBC 11.9* 15.3*  --   --  17.3*  --   --  15.3*  --  21.1*  CREATININE 1.33* 1.12 1.10 1.09  --   --   --   --  1.11 1.25*  LATICACIDVEN  --   --   --   --   --  2.5* 1.6  --   --   --     Estimated Creatinine Clearance: 43.5 mL/min (by C-G formula based on SCr of 1.25 mg/dL (H)).    Allergies  Allergen Reactions  . Lopressor [Metoprolol Tartrate] Palpitations    LOPRESSOR bothers the patient.  . Niacin Swelling    FACIAL EDEMA    Antimicrobials this admission: 10/21 Vanc >>  10/21 zosyn >>   Dose adjustments this admission: n/a  Microbiology results: 10/3 MRSA PCR: neg  10/21 BCx: ngtd   [name], PharmD, BCPS Clinical Pharmacist 09/06/2016 10:48 AM

## 2016-09-06 NOTE — Progress Notes (Signed)
09/06/2016 12:37 PM Nursing note MD notified of pt. Not voiding spontaneously since intubation this am. Bladder scan revealing 450 ml urine in bladder. Dr. Molli KnockYacoub with verbal order to place foley. Orders enacted. Will continue to closely monitor patient.  Donald Memoli, Blanchard KelchStephanie Ingold

## 2016-09-06 NOTE — Progress Notes (Signed)
Pt intubated by ICU MD. 7.5 ETT secured at 23 at lip with Hollister tube holder. Positive color change on ETCO2 detector and BBS equal. Placed on vent settings per MD order. PRVC 550 (8cc), RR 18, FIO2 100%, Peep +10. Pt tol well. Will cont to monitor

## 2016-09-06 NOTE — Progress Notes (Signed)
Nutrition Follow Up  DOCUMENTATION CODES:   Severe malnutrition in context of chronic illness  INTERVENTION:    TPN per pharmacy  NUTRITION DIAGNOSIS:   Malnutrition related to chronic illness as evidenced by severe depletion of muscle mass, severe depletion of body fat, ongoing  GOAL:   Patient will meet greater than or equal to 90% of their needs, met  MONITOR:   Vent status, Labs, Weight trends, Skin, I & O's, TPN prescription  ASSESSMENT:   75 year old male, PMH DM, COPD, GERD, HTN, pancreatitis, stroke. Patient has known severe peripheral vascular disease followed by  Dr. Kellie Simmering for  his left lower extremity femoral to posterior tibial bypass which was done using composite bilateral cephalic vein grafts in 1155. He has had previous left carotid endarterectomy and redo left carotid endarterectomy most recent of which was 2007.  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 Developed encephalopathy and pulled NGT out 10/20. Per CCM, likely aspiration event, distress and hypoxia today. S/p bedside swallow 10/22 >> SLP rec continued NPO status. Labs and medications reviewed.  Patient is receiving TPN with Clinimix E 5/20 @ 65 ml/hr and lipids @ 10 ml/hr.  Provides 1853 kcal and 78 grams protein per day.  Meets 100% minimum re-estimated energy needs and 97% minimum re-estimated protein needs.  Diet Order:  TPN (CLINIMIX-E) Adult TPN (CLINIMIX-E) Adult Diet NPO time specified  Skin:   (Incision on abdomen)  Last BM:  10/21   CBG (last 3)   Recent Labs  09/05/16 1923 09/05/16 2323 09/06/16 0313  GLUCAP 154* 131* 95   Height:   Ht Readings from  Last 1 Encounters:  09/08/2016 5' 9"  (1.753 m)    Weight:   Wt Readings from Last 1 Encounters:  09/05/16 132 lb 11.5 oz (60.2 kg)    Ideal Body Weight:  72.7 kg  BMI:  Body mass index is 19.6 kg/m.  Re-estimated Nutritional Needs:   Kcal:  1852  Protein:  80-95 gm  Fluid:  per MD  EDUCATION NEEDS:   No education needs identified at this time  Arthur Holms, RD, LDN Pager #: [phone] After-Hours Pager #: [phone]

## 2016-09-06 NOTE — Progress Notes (Signed)
SLP Cancellation Note  Patient Details Name: [name] MRN: [id number] DOB: [birth year]   Cancelled treatment:       Reason Eval/Treat Not Completed: Medical issues which prohibited therapy. Pt to be intubated per RN. Will f/u as able.   Maxcine Hamaiewonsky, Morgon Pamer 09/06/2016, 9:21 AM  Maxcine HamLaura Paiewonsky, M.A. CCC-SLP [phone]

## 2016-09-06 NOTE — Procedures (Signed)
OGT placed  Placed glide Direct visualization , placed OGT easily Back over 1 liter easily immediately bile Decompressed prior to ett and etomidate  Mcarthur Rossettianiel J. Tyson AliasFeinstein, MD, FACP Pgr: [phone] [facility]

## 2016-09-06 NOTE — Progress Notes (Addendum)
ANTICOAGULATION CONSULT NOTE - Follow Up Consult  Pharmacy Consult for Lovenox Indication: atrial fibrillation.  Assessment: [name] on Coumadin PTA for AFib, now s/p thoracic aortic endovascular stent graft on 10/12. Pt currently on full-dose enoxaparin, originally planned to restart warfarin but due to ileus and clinical condition, this continues to be on hold. TPN was started for nutrition  INR stable at 1.27 (last dose of warfarin was 10/14). Hgb improved at 8.6 after 1 unit PRBC given 10/19, plt wnl. No bleeding issues noted.  Goal of Therapy:  INR 2-3 Anti-Xa level 0.6-1 units/ml 4hrs after LMWH dose given Monitor platelets by anticoagulation protocol: Yes   Plan:  -Continue Lovenox 60mg  [place] BID  -Monitor CBC q72h, s/sx bleeding -Continue to hold warfarin, resume daily INR once warfarin resumed  Allergies  Allergen Reactions  . Lopressor [Metoprolol Tartrate] Palpitations    LOPRESSOR bothers the patient.  . Niacin Swelling    FACIAL EDEMA    Patient Measurements: Height: 5\' 9"  (175.3 cm) (Simultaneous filing. User may not have seen previous data.) Weight: 132 lb 11.5 oz (60.2 kg) IBW/kg (Calculated) : 70.7 Heparin Dosing Weight: 59.9kg  Vital Signs: Temp: 100.1 F (37.8 C) (10/23 0900) Temp Source: Axillary (10/23 0300) BP: 154/54 (10/23 0815) Pulse Rate: 105 (10/23 0815)  Labs:  Recent Labs  09/04/16 0357  09/04/16 1150 09/04/16 1413 09/05/16 0430 09/05/16 0900 09/06/16 0310  HGB  --   < > 10.4*  --  8.9*  --  8.6*  HCT  --   --  31.0*  --  27.0*  --  26.3*  PLT  --   --  203  --  173  --  175  CREATININE 1.09  --   --   --   --  1.11 1.25*  TROPONINI  --   --   --  0.04*  --  0.03*  --   < > = values in this interval not displayed.  Estimated Creatinine Clearance: 43.5 mL/min (by C-G formula based on SCr of 1.25 mg/dL (H)).   Babs BertinHaley Steve Youngberg, PharmD, BCPS Clinical Pharmacist Pager [phone] 09/06/2016 10:51 AM

## 2016-09-07 ENCOUNTER — Inpatient Hospital Stay (HOSPITAL_COMMUNITY): Payer: Medicare Other

## 2016-09-07 DIAGNOSIS — R400 Somnolence: Secondary | ICD-10-CM

## 2016-09-07 DIAGNOSIS — L8990 Pressure ulcer of unspecified site, unspecified stage: Secondary | ICD-10-CM | POA: Insufficient documentation

## 2016-09-07 DIAGNOSIS — T17908A Unspecified foreign body in respiratory tract, part unspecified causing other injury, initial encounter: Secondary | ICD-10-CM

## 2016-09-07 DIAGNOSIS — K567 Ileus, unspecified: Secondary | ICD-10-CM

## 2016-09-07 DIAGNOSIS — Z452 Encounter for adjustment and management of vascular access device: Secondary | ICD-10-CM

## 2016-09-07 DIAGNOSIS — R140 Abdominal distension (gaseous): Secondary | ICD-10-CM

## 2016-09-07 LAB — BASIC METABOLIC PANEL
Anion gap: 6 (ref 5–15)
BUN: 39 mg/dL — AB (ref 6–20)
CALCIUM: 7.9 mg/dL — AB (ref 8.9–10.3)
CHLORIDE: 113 mmol/L — AB (ref 101–111)
CO2: 21 mmol/L — AB (ref 22–32)
CREATININE: 1.75 mg/dL — AB (ref 0.61–1.24)
GFR calc non Af Amer: 36 mL/min — ABNORMAL LOW (ref >60)
GFR, EST AFRICAN AMERICAN: 42 mL/min — AB (ref >60)
GLUCOSE: 169 mg/dL — AB (ref 65–99)
Potassium: 4.5 mmol/L (ref 3.5–5.1)
Sodium: 140 mmol/L (ref 135–145)

## 2016-09-07 LAB — GLUCOSE, CAPILLARY
GLUCOSE-CAPILLARY: 114 mg/dL — AB (ref 65–99)
GLUCOSE-CAPILLARY: 120 mg/dL — AB (ref 65–99)
GLUCOSE-CAPILLARY: 123 mg/dL — AB (ref 65–99)
GLUCOSE-CAPILLARY: 153 mg/dL — AB (ref 65–99)
Glucose-Capillary: 198 mg/dL — ABNORMAL HIGH (ref 65–99)
Glucose-Capillary: 153 mg/dL — ABNORMAL HIGH (ref 65–99)
Glucose-Capillary: 123 mg/dL — ABNORMAL HIGH (ref 65–99)

## 2016-09-07 LAB — VANCOMYCIN, TROUGH: VANCOMYCIN TR: 18 ug/mL (ref 15–20)

## 2016-09-07 LAB — MAGNESIUM: Magnesium: 2.1 mg/dL (ref 1.7–2.4)

## 2016-09-07 LAB — PHOSPHORUS: Phosphorus: 5.4 mg/dL — ABNORMAL HIGH (ref 2.5–4.6)

## 2016-09-07 MED ORDER — VANCOMYCIN HCL 500 MG IV SOLR
500.0000 mg | Freq: Two times a day (BID) | INTRAVENOUS | Status: DC
  Administered 2016-09-07 – 2016-09-09 (×4): 500 mg via INTRAVENOUS
  Filled 2016-09-07 (×5): qty 500

## 2016-09-07 MED ORDER — SODIUM CHLORIDE 0.9 % IV SOLN
100.0000 mg | INTRAVENOUS | Status: DC
  Administered 2016-09-08: 100 mg via INTRAVENOUS
  Filled 2016-09-07 (×2): qty 100

## 2016-09-07 MED ORDER — FLEET ENEMA 7-19 GM/118ML RE ENEM
1.0000 | ENEMA | Freq: Once | RECTAL | Status: AC
Start: 2016-09-07 — End: 2016-09-07
  Administered 2016-09-07: 1 via RECTAL
  Filled 2016-09-07: qty 1

## 2016-09-07 MED ORDER — ATROPINE SULFATE 1 MG/10ML IJ SOSY
PREFILLED_SYRINGE | INTRAMUSCULAR | Status: AC
  Filled 2016-09-07: qty 10

## 2016-09-07 MED ORDER — FLUCONAZOLE IN SODIUM CHLORIDE 400-0.9 MG/200ML-% IV SOLN
400.0000 mg | INTRAVENOUS | Status: DC
  Filled 2016-09-07: qty 200

## 2016-09-07 MED ORDER — CHLORHEXIDINE GLUCONATE 0.12% ORAL RINSE (MEDLINE KIT)
15.0000 mL | Freq: Two times a day (BID) | OROMUCOSAL | Status: DC
  Administered 2016-09-07 – 2016-09-09 (×4): 15 mL via OROMUCOSAL

## 2016-09-07 MED ORDER — SODIUM CHLORIDE 0.45 % IV SOLN
INTRAVENOUS | Status: DC
  Administered 2016-09-07: 21:00:00 via INTRAVENOUS
  Administered 2016-09-07: 50 mL/h via INTRAVENOUS
  Administered 2016-09-08: 08:00:00 via INTRAVENOUS

## 2016-09-07 MED ORDER — HEPARIN SODIUM (PORCINE) 5000 UNIT/ML IJ SOLN
5000.0000 [IU] | Freq: Three times a day (TID) | INTRAMUSCULAR | Status: DC
  Administered 2016-09-07 – 2016-09-09 (×7): 5000 [IU] via SUBCUTANEOUS
  Filled 2016-09-07 (×5): qty 1

## 2016-09-07 MED ORDER — ORAL CARE MOUTH RINSE
15.0000 mL | OROMUCOSAL | Status: DC
Start: 2016-09-07 — End: 2016-09-10
  Administered 2016-09-07 – 2016-09-09 (×22): 15 mL via OROMUCOSAL

## 2016-09-07 MED ORDER — CHLORHEXIDINE GLUCONATE 0.12% ORAL RINSE (MEDLINE KIT): 15.0000 mL | Freq: Two times a day (BID) | OROMUCOSAL | Status: DC

## 2016-09-07 MED ORDER — LABETALOL HCL 5 MG/ML IV SOLN
10.0000 mg | INTRAVENOUS | Status: DC
  Administered 2016-09-07 – 2016-09-08 (×4): 10 mg via INTRAVENOUS
  Filled 2016-09-07 (×4): qty 4

## 2016-09-07 MED ORDER — ORAL CARE MOUTH RINSE: 15.0000 mL | OROMUCOSAL | Status: DC

## 2016-09-07 MED ORDER — TRACE MINERALS CR-CU-MN-SE-ZN 10-1000-500-60 MCG/ML IV SOLN
INTRAVENOUS | Status: AC
  Administered 2016-09-07: 17:00:00 via INTRAVENOUS
  Filled 2016-09-07: qty 1560

## 2016-09-07 MED ORDER — SODIUM CHLORIDE 0.9 % IV SOLN
200.0000 mg | Freq: Once | INTRAVENOUS | Status: AC
  Administered 2016-09-07: 200 mg via INTRAVENOUS
  Filled 2016-09-07: qty 200

## 2016-09-07 NOTE — Progress Notes (Signed)
PCCM PROGRESS NOTE  Admission date: 08-26-16 Consult date: 09/04/2016 Referring provider: Dr. Myra Gianotti, VVS  CC: altered mental status  Subjective: Did well on vent, some HTN Does follow commands NO BM  Vital signs: BP (!) 134/51   Pulse 99   Temp 100.2 F (37.9 C) (Axillary)   Resp (!) 26   Ht 5\' 9"  (1.753 m) Comment: Simultaneous filing. User may not have seen previous data.  Wt 60.2 kg (132 lb 11.5 oz)   SpO2 100%   BMI 19.60 kg/m   Intake/output: I/O last 3 completed shifts: In: 4462.5 [I.V.:3672.5; NG/GT:90; IV Piggyback:700] Out: 3780 [Urine:1480; Emesis/NG output:2300]  General: calm, mild tachy Neuro: follows simple commands, rass -2 HEENT: jvd wnl or low Cardiac: s1 s2 rrt no murmur Chest ronchi improved Abd: soft, non tender, distended mild, BS low, no changes Ext: no edema Skin: no rashes   CMP Latest Ref Rng & Units 09/07/2016 09/06/2016 09/05/2016  Glucose 65 - 99 mg/dL 161(W) 960(A) 540(J)  BUN 6 - 20 mg/dL 81(X) 91(Y) 78(G)  Creatinine 0.61 - 1.24 mg/dL 9.56(O) 1.30(Q) 6.57  Sodium 135 - 145 mmol/L 140 143 143  Potassium 3.5 - 5.1 mmol/L 4.5 3.8 3.2(L)  Chloride 101 - 111 mmol/L 113(H) 116(H) 115(H)  CO2 22 - 32 mmol/L 21(L) 20(L) 22  Calcium 8.9 - 10.3 mg/dL 7.9(L) 7.9(L) 8.0(L)  Total Protein 6.5 - 8.1 g/dL - 5.4(L) -  Total Bilirubin 0.3 - 1.2 mg/dL - 0.8 -  Alkaline Phos 38 - 126 U/L - 57 -  AST 15 - 41 U/L - 24 -  ALT 17 - 63 U/L - 22 -    CBC Latest Ref Rng & Units 09/06/2016 09/05/2016 09/04/2016  WBC 4.0 - 10.5 K/uL 21.1(H) 15.3(H) 17.3(H)  Hemoglobin 13.0 - 17.0 g/dL 8.4(O) 8.9(L) 10.4(L)  Hematocrit 39.0 - 52.0 % 26.3(L) 27.0(L) 31.0(L)  Platelets 150 - 400 K/uL 175 173 203    ABG    Component Value Date/Time   PHART 7.338 (L) 09/06/2016 1217   PCO2ART 36.9 09/06/2016 1217   PO2ART 272.0 (H) 09/06/2016 1217   HCO3 19.6 (L) 09/06/2016 1217   TCO2 21 09/06/2016 1217   ACIDBASEDEF 5.0 (H) 09/06/2016 1217   O2SAT 100.0  09/06/2016 1217    CBG (last 3)   Recent Labs  09/06/16 2336 09/07/16 0424 09/07/16 0729  GLUCAP 201* 153* 153*     Imaging: Dg Abd 1 View  Result Date: 09/06/2016 CLINICAL DATA:  OG tube placement EXAM: ABDOMEN - 1 VIEW COMPARISON:  None. FINDINGS: Orogastric tube with the tip projecting over the stomach. Descending thoracic aortic stent graft noted. There is no bowel dilatation to suggest obstruction. There is no evidence of pneumoperitoneum, portal venous gas or pneumatosis. There are no pathologic calcifications along the expected course of the ureters. There is lumbar spine spondylosis. There is bilateral lower lobe airspace disease. IMPRESSION: Orogastric tube with the tip projecting over the stomach. Electronically Signed   By: Elige Ko   On: 09/06/2016 10:53   Dg Abd 1 View  Result Date: 09/05/2016 CLINICAL DATA:  Abdominal pain EXAM: ABDOMEN - 1 VIEW COMPARISON:  09/04/2016 FINDINGS: Persistent gaseous distended small bowel loops suspicious for ileus or partial bowel obstruction. IMPRESSION: Persistent gaseous distended small bowel loops suspicious for ileus or bowel obstruction. Electronically Signed   By: Natasha Mead M.D.   On: 09/05/2016 11:35   Portable Chest Xray  Result Date: 09/06/2016 CLINICAL DATA:  Intubation, aspiration pneumonia EXAM: PORTABLE CHEST 1  VIEW COMPARISON:  09/06/2016 FINDINGS: Endotracheal tube is 7 cm above the carina. Right PICC line tip is in the SVC, unchanged. NG tube enters the stomach. Prior descending thoracic aortic repair. Heart is normal size. Bilateral lower lobe airspace opacities with small bilateral effusions. No real change since prior study. IMPRESSION: Bilateral lower lobe airspace disease with small effusions, not significantly changed. Endotracheal tube 7 cm above the carina. Electronically Signed   By: Charlett NoseKevin  Dover M.D.   On: 09/06/2016 10:51   Dg Chest Port 1 View  Result Date: 09/06/2016 CLINICAL DATA:  Shortness of Breath  EXAM: PORTABLE CHEST 1 VIEW COMPARISON:  09/05/2016 FINDINGS: Aortic stent graft is again identified and stable. Right-sided PICC line is again noted and stable. Right-sided pleural effusion is noted posteriorly. Some interval improved aeration in the right base is noted. Persistent left retrocardiac density is seen. Small left effusion is noted as well. IMPRESSION: Improved aeration in the right base. Persistent right effusion is noted. Stable left retrocardiac density. Electronically Signed   By: Alcide CleverMark  Lukens M.D.   On: 09/06/2016 08:06   Studies: CT head 10/20 >> atrophy, old infarcts, chronic small vessel ischemic disease EEG 10/21 >> diffuse encephalopathy  MRI brain 10/21 >> moderate atrophy, old infarcts  Cultures: Blood 10/21 >> Sputum 10/23>>>  Antibiotics: Vancomycin 10/21 >> Zosyn 10/21 >>   Significant events: 10/11  Admit for endovascular stent of thoracic aortic aneurysm  10/14  Ileus, HTN 10/20  Pt pulled NGT out  10/21  Encephalopathy 10/23- asp event likely, distress, hypoxia - ETT  Summary: 75 yo male smoker admitted 10/11 for planned endovascular thoracic aortic stent.  Post op course complicated by HTN, AKI, ileus.  He developed altered mental status, progressive HTN and fever 10/21 and PCCM was asked to assist with medical management in ICU.  PMHx of Anemia, GERD with Barrett's esophagus, Hiatal hernia, Anxiety, Gout, Chronic back pain, CVA, A fib on coumadin, CAD, DM, COPD, DVT.   Assessment/plan:  ARF,. Asp event, hypoxia -pcxr is improved new rt infiltrate, pcxr in am  -even balance goals -abg reviewed, keep same MV -peep can go to 5, then we will wean cpap 5 ps5 goal 2 hours -await ileus and neurostatus to also improve prior to extubation  Acute metabolic encephalopathy, neg mri, eeg dele irium  - avoid benzo -prop, int fent , avoif drip with ilesu as able -WUA  ID Fever, asp event  - continued abx regimen -sputum awaited -some improvement rt  infiltrate  HTN. Hx of A fib. Sow SR - cardene gtt for goal SBP < 160, not needed on prop - continue catapres patch - add scheduled labetolol IV  S/p endovascular stent to thoracic aorta. - post-op care per VVS -abdo soft, ileus remains -NGT to suction should remain -reglan -add enema attempts as he had 3 BM prior, more likely in rectum  Ileus >> improved. Dysphagia. Nutrition. - continue reglan for now - TPN, but want to limit asap -enema, reglan -follow output from ngt  Anemia of critical illness. - f/u CBC Lov dc with clearance, add sub q hep  DM type II. - SSI -reglan  COPD. - brovana, pulmicort and prn duoneb Lower peep -avoid high rates  ARF,ATN, hypovolemia? hyperChloremia (TPN also a contribute) No lasix Pos balance goals to remain as goal Chem in am  Change fluids to 1/2 NS and reduce slight Unable to manipulate Cl and acetate myself with this tpn formula   Ccm time 35 min  I spent multiple visits with family . Updating them on condition  Mcarthur Rossetti. Tyson Alias, MD, FACP Pgr: [phone] [facility]

## 2016-09-07 NOTE — Progress Notes (Signed)
Patient transported to CT and back. No complications noted.  °

## 2016-09-07 NOTE — Progress Notes (Addendum)
Pharmacy Antibiotic Note  Debroah Ballerrchie L Pierro is a 75 y.o. male admitted on 09/06/2016 for thoracic stenting on 10/11. Pharmacy has been consulted for Vancomycin and Zosyn dosing for PNA, aspiration event likely per CCM. Intubated 10/23 with respiratory distress. Antibiotics Day #4. SCr bump 1.11>1.75, CrCl~31, good UOP noted. Tmax/24h 100.5, wbc up to 21.1.  Vancomycin dose was taken off MAR this AM with SCr continuing to bump. However, vancomycin trough was therapeutic (18) on current dose, drawn appropriately. Will resume, no doses missed.  Plan: -Zosyn 3.375gm IV q8h - doses over 4 hours -Resume vancomycin 500mg  IV q12h -Will f/u micro data, renal function, and LOT -VT prn as indicated   Height: 5\' 9"  (175.3 cm) (Simultaneous filing. User may not have seen previous data.) Weight: 132 lb 11.5 oz (60.2 kg) IBW/kg (Calculated) : 70.7  Temp (24hrs), Avg:99.8 F (37.7 C), Min:98.3 F (36.8 C), Max:100.5 F (38.1 C)   Recent Labs Lab 09/02/16 0420 09/03/16 0500 09/03/16 0726 09/04/16 0357 09/04/16 1150 09/04/16 1431 09/05/16 0238 09/05/16 0430 09/05/16 0900 09/06/16 0310 09/06/16 1000 09/07/16 0510 09/07/16 1235  WBC 11.9* 15.3*  --   --  17.3*  --   --  15.3*  --  21.1*  --   --   --   CREATININE 1.33* 1.12 1.10 1.09  --   --   --   --  1.11 1.25*  --  1.75*  --   LATICACIDVEN  --   --   --   --   --  2.5* 1.6  --   --   --  1.4  --   --   VANCOTROUGH  --   --   --   --   --   --   --   --   --   --   --   --  18    Estimated Creatinine Clearance: 31.1 mL/min (by C-G formula based on SCr of 1.75 mg/dL (H)).    Allergies  Allergen Reactions  . Lopressor [Metoprolol Tartrate] Palpitations    LOPRESSOR bothers the patient.  . Niacin Swelling    FACIAL EDEMA    Antimicrobials this admission: 10/21 Vanc >>  10/21 zosyn >>   Dose adjustments this admission: VT 10/24: 18 on 500 q12h  Microbiology results: 10/3 MRSA PCR: neg  10/21 BCx: ngtd   Babs BertinHaley Saesha Llerenas, PharmD,  BCPS Clinical Pharmacist 09/07/2016 1:44 PM

## 2016-09-07 NOTE — Progress Notes (Addendum)
[facility] Progress Note  Subjective  - POD 13  Sedated on vent.   Objective Vitals:   09/07/16 0600 09/07/16 0700  BP: 112/61 (!) 134/51  Pulse: 99 99  Resp: (!) 27 (!) 26  Temp:      Intake/Output Summary (Last 24 hours) at 09/07/16 0727 Last data filed at 09/07/16 0000  Gross per 24 hour  Intake          1837.14 ml  Output             2605 ml  Net          -767.86 ml   Sedated on vent. Not arousable.  Respirations even Tachycardic Abdomen soft. Mild distension. No BS. Incisions clean and intact.  Palpable left PT. Right foot warm.   Assessment/Planning: 75 y.o. male is s/p: stent graft repair descending thoracic aortic aneurysm with retroperitoneal exposure.  13 Days Post-Op   Pulm: Intubated yesterday for likely aspiration. Effusions improved on CXR today.  Management per PCCM.  CV: SBP 120 this am. Off cardene gtt yesterday. Currently on clonidine patch. Prn antihypertensives.  GI: ileus- high OG output, continue OG tube and TPN Renal: ARF creatinine rising, decreased UOP 160 cc last shift. ?May need to hold vanc Heme: Hgb stable. Continue on lovenox. Hold coumadin for now.  ID: Tmax 100.5 yesterday, WBC increasing, currently on vancomycin and zosyn. Cultures pending. Follow. May need to change abx.  Neuro: sedated, unresponsive this am. MRI without acute intracranial process. EEG neg. Neurology has evaluated and recently signed off. Eval once patient becomes more alert.   Appreciate CCM, neuro and TCTS assistance with this patient.  Raymond Gurney 09/07/2016 7:27 AM --  Laboratory CBC    Component Value Date/Time   WBC 21.1 (H) 09/06/2016 0310   HGB 8.6 (L) 09/06/2016 0310   HCT 26.3 (L) 09/06/2016 0310   PLT 175 09/06/2016 0310    BMET    Component Value Date/Time   NA 140 09/07/2016 0510   K 4.5 09/07/2016 0510   CL 113 (H) 09/07/2016 0510   CO2 21 (L) 09/07/2016 0510   GLUCOSE 169 (H) 09/07/2016 0510   BUN 39 (H)  09/07/2016 0510   CREATININE 1.75 (H) 09/07/2016 0510   CALCIUM 7.9 (L) 09/07/2016 0510   GFRNONAA 36 (L) 09/07/2016 0510   GFRAA 42 (L) 09/07/2016 0510    COAG Lab Results  Component Value Date   INR 1.27 09/01/2016   INR 1.28 08/31/2016   INR 1.23 08/30/2016   No results found for: PTT  Antibiotics Anti-infectives    Start     Dose/Rate Route Frequency Ordered Stop   09/04/16 1300  piperacillin-tazobactam (ZOSYN) IVPB 3.375 g     3.375 g 12.5 mL/hr over 240 Minutes Intravenous Every 8 hours 09/04/16 1249     09/04/16 1247  vancomycin (VANCOCIN) 500 mg in sodium chloride 0.9 % 100 mL IVPB     500 mg 100 mL/hr over 60 Minutes Intravenous Every 12 hours 09/04/16 1248     09/04/16 0800  piperacillin-tazobactam (ZOSYN) IVPB 3.375 g  Status:  Discontinued     3.375 g 12.5 mL/hr over 240 Minutes Intravenous Every 8 hours 09/03/16 2355 09/04/16 1249   09/04/16 0000  vancomycin (VANCOCIN) 500 mg in sodium chloride 0.9 % 100 mL IVPB  Status:  Discontinued     500 mg 100 mL/hr over 60 Minutes Intravenous Every 12 hours 09/03/16 2355 09/04/16 1248   09/04/16 0000  piperacillin-tazobactam (  ZOSYN) IVPB 3.375 g     3.375 g 100 mL/hr over 30 Minutes Intravenous STAT 09/03/16 2355 09/04/16 0142   08/26/16 1000  levofloxacin (LEVAQUIN) tablet 750 mg  Status:  Discontinued     750 mg Oral Daily 08/19/2016 1624 08/26/16 0809   08/24/2016 1630  cefUROXime (ZINACEF) 1.5 g in dextrose 5 % 50 mL IVPB     1.5 g 100 mL/hr over 30 Minutes Intravenous Every 12 hours 08/20/2016 1624 08/26/16 0430   09/02/2016 0705  dextrose 5 % with cefUROXime (ZINACEF) ADS Med    Comments:  Scronce, Trina   : cabinet override      08/27/2016 0705 08/20/2016 0842   08/26/2016 0604  cefUROXime (ZINACEF) 1.5 g in dextrose 5 % 50 mL IVPB     1.5 g 100 mL/hr over 30 Minutes Intravenous 30 min pre-op 09/01/2016 0604 08/21/2016 0842       Maris BergerKimberly Trinh, PA-C [facility] Office: [phone] Pager:  [phone] 09/07/2016 7:27 AM   Agree with the above.  I have seen and examined the patient.  He looks more comfortable after intubation yesterday.   GI:  Possible aspiration yesterday prior to intubation as he had emesis.  Now with bile colored drainage from NG.  Keep NPO.  Will get CT scan with PO contrast via tube to better eval bowel given RP exposure ID:  Aspirate shows yeast.  D/w pharmacy, will start antifungal treatment tonight with pharmacy dosing.  Will repeat blood cultures Renal:  Worsening renal function.  Hold nephrotoxic meds.  Hold diuresis today.  If continues to increase,will consult nephrology Nutrition:  On concentrated TPN to minimize voloume Updated family today.  Will consider palliative care consult, to assist with management.  He is not a comfort care patient, but early Palliative input would be beneficial, should he deteriorate   Durene CalWells Zianne Schubring

## 2016-09-07 NOTE — Care Management Important Message (Signed)
Important Message  Patient Details  Name: [name] MRN: [id number] Date of Birth: 1941/03/02   Medicare Important Message Given:  Yes    [name] 09/07/2016, 12:34 PM

## 2016-09-07 NOTE — Progress Notes (Signed)
Patient ID: [name], male   DOB: 02-Aug-1941, 75 y.o.   MRN: [id number] TCTS DAILY ICU PROGRESS NOTE                   [address]            [place] [address]          [phone]   13 Days Post-Op Procedure(s) (LRB): THORACIC AORTIC ENDOVASCULAR STENT GRAFT WITH RETROPERITONEAL EXPOSURE (N/A)  Total Length of Stay:  LOS: 13 days   Subjective: Patient sedated on vent , but follows simple commands   Objective: Vital signs in last 24 hours: Temp:  [99.8 F (37.7 C)-100.5 F (38.1 C)] 100.2 F (37.9 C) (10/24 0736) Pulse Rate:  [80-125] 89 (10/24 1100) Cardiac Rhythm: Normal sinus rhythm;Atrial fibrillation (10/24 0800) Resp:  [17-35] 24 (10/24 1100) BP: (94-163)/(42-65) 113/47 (10/24 1100) SpO2:  [97 %-100 %] 97 % (10/24 1100) FiO2 (%):  [40 %-50 %] 40 % (10/24 0804)  Filed Weights   09/06/2016 0723 09/05/16 0500  Weight: 132 lb (59.9 kg) 132 lb 11.5 oz (60.2 kg)    Weight change:    Hemodynamic parameters for last 24 hours:    Intake/Output from previous day: 10/23 0701 - 10/24 0700 In: 2562.5 [I.V.:2022.5; NG/GT:90; IV Piggyback:450] Out: 3105 [Urine:805; Emesis/NG output:2300]  Intake/Output this shift: Total I/O In: 293.8 [I.V.:243.8; IV Piggyback:50] Out: 325 [Emesis/NG output:325]  Current Meds: Scheduled Meds: . amLODipine  10 mg Oral Q supper  . arformoterol  15 mcg Nebulization BID  . budesonide (PULMICORT) nebulizer solution  0.25 mg Nebulization BID  . chlorhexidine  15 mL Mouth Rinse BID  . cloNIDine  0.3 mg Transdermal Weekly  . heparin subcutaneous  5,000 Units Subcutaneous Q8H  . insulin aspart  0-15 Units Subcutaneous Q4H  . labetalol  10 mg Intravenous Q4H  . losartan  50 mg Oral Q lunch  . mouth rinse  15 mL Mouth Rinse q12n4p  . metoCLOPramide (REGLAN) injection  10 mg Intravenous Q6H  . piperacillin-tazobactam (ZOSYN)  IV  3.375 g Intravenous Q8H  . propafenone  225 mg Oral BID  . sodium chloride flush  10-40 mL  Intracatheter Q12H  . Vitamin D (Ergocalciferol)  50,000 Units Oral Q7 days   Continuous Infusions: . sodium chloride 50 mL/hr (09/07/16 1042)  . propofol (DIPRIVAN) infusion 20 mcg/kg/min (09/07/16 1100)  . TPN (CLINIMIX) Adult without lytes    . Marland KitchenTPN (CLINIMIX-E) Adult 65 mL/hr at 09/07/16 0700   PRN Meds:.[DISCONTINUED] acetaminophen **OR** acetaminophen, alum & mag hydroxide-simeth, bisacodyl, docusate sodium, fentaNYL (SUBLIMAZE) injection, fentaNYL (SUBLIMAZE) injection, guaiFENesin-dextromethorphan, hydrALAZINE, ipratropium-albuterol, ondansetron, phenol, potassium chloride, sodium chloride flush, tamsulosin  General appearance: alert and cooperative Neurologic: intact Heart: regular rate and rhythm, S1, S2 normal, no murmur, click, rub or gallop Lungs: diminished breath sounds bibasilar Abdomen: soft, non-tender; bowel sounds normal; no masses,  no organomegaly and not distended as was several days ago Extremities: extremities normal, atraumatic, no cyanosis or edema and left leg graft with pulse  Lab Results: CBC: Recent Labs  09/05/16 0430 09/06/16 0310  WBC 15.3* 21.1*  HGB 8.9* 8.6*  HCT 27.0* 26.3*  PLT 173 175   BMET:  Recent Labs  09/06/16 0310 09/07/16 0510  NA 143 140  K 3.8 4.5  CL 116* 113*  CO2 20* 21*  GLUCOSE 102* 169*  BUN 23* 39*  CREATININE 1.25* 1.75*  CALCIUM 7.9* 7.9*    CMET: Lab Results  Component Value  Date   WBC 21.1 (H) 09/06/2016   HGB 8.6 (L) 09/06/2016   HCT 26.3 (L) 09/06/2016   PLT 175 09/06/2016   GLUCOSE 169 (H) 09/07/2016   TRIG 374 (H) 09/06/2016   ALT 22 09/06/2016   AST 24 09/06/2016   NA 140 09/07/2016   K 4.5 09/07/2016   CL 113 (H) 09/07/2016   CREATININE 1.75 (H) 09/07/2016   BUN 39 (H) 09/07/2016   CO2 21 (L) 09/07/2016   INR 1.27 09/01/2016   HGBA1C 6.5 (H) 08/27/2016    PT/INR: No results for input(s): LABPROT, INR in the last 72 hours. Radiology: Portable Chest Xray  Result Date:  09/07/2016 CLINICAL DATA:  Aspiration pneumonia, intubated. EXAM: PORTABLE CHEST 1 VIEW COMPARISON:  09/06/2016 FINDINGS: Support devices including endotracheal tube are stable. Heart is normal size. There is hyperinflation of the lungs compatible with COPD. Improving perihilar and lower lobe opacities, likely improving edema or infection. Suspect small effusions, also improved. IMPRESSION: Improving bilateral perihilar and lower lobe airspace opacities and bilateral effusions. Electronically Signed   By: Charlett NoseKevin  Dover M.D.   On: 09/07/2016 08:58   Chronic Kidney Disease   Stage I     GFR >90  Stage II    GFR 60-89  Stage IIIA GFR 45-59  Stage IIIB GFR 30-44  Stage IV   GFR 15-29  Stage V    GFR  <15  Lab Results  Component Value Date   CREATININE 1.75 (H) 09/07/2016   Estimated Creatinine Clearance: 31.1 mL/min (by C-G formula based on SCr of 1.75 mg/dL (H)).   Assessment/Plan: S/P Procedure(s) (LRB): THORACIC AORTIC ENDOVASCULAR STENT GRAFT WITH RETROPERITONEAL EXPOSURE (N/A) Acute on chronic renal injury CL remains high and CO2 slightly low On tna Chest xray improving  Continue vent support for now per ccm      Delight Ovensdward B Daijon Wenke 09/07/2016 11:37 AM

## 2016-09-07 NOTE — Progress Notes (Signed)
CT surgery p.m. Rounds  Sedated and comfortable on ventilator after being reintubated yesterday for probable aspiration Blood pressure vital signs stable this p.m. Tracheal aspirate with moderate white cells and occasional  yeast

## 2016-09-07 NOTE — Progress Notes (Signed)
SLP Cancellation Note  Patient Details Name: [name] MRN: [id number] DOB: [birth year]   Cancelled treatment:       Reason Eval/Treat Not Completed: Medical issues which prohibited therapy (pt sedated, on vent).   Maxcine Hamaiewonsky, Lyndel Sarate 09/07/2016, 8:47 AM  Maxcine HamLaura Paiewonsky, M.A. CCC-SLP [phone]

## 2016-09-07 NOTE — Progress Notes (Signed)
PARENTERAL NUTRITION CONSULT NOTE - FOLLOW UP  Pharmacy Consult for TPN Indication: Prolonged ileus  Allergies  Allergen Reactions  . Lopressor [Metoprolol Tartrate] Palpitations    LOPRESSOR bothers the patient.  . Niacin Swelling    FACIAL EDEMA    Patient Measurements: Height: 5\' 9"  (175.3 cm) (Simultaneous filing. User may not have seen previous data.) Weight: 132 lb 11.5 oz (60.2 kg) IBW/kg (Calculated) : 70.7   Vital Signs: Temp: 100.2 F (37.9 C) (10/24 0736) Temp Source: Axillary (10/24 0736) BP: 134/51 (10/24 0700) Pulse Rate: 99 (10/24 0700) Intake/Output from previous day: 10/23 0701 - 10/24 0700 In: 2562.5 [I.V.:2022.5; NG/GT:90; IV Piggyback:450] Out: 3105 [Urine:805; Emesis/NG output:2300] Intake/Output from this shift: Total I/O In: 50 [IV Piggyback:50] Out: -   Labs:  Recent Labs  09/04/16 1150 09/05/16 0430 09/06/16 0310  WBC 17.3* 15.3* 21.1*  HGB 10.4* 8.9* 8.6*  HCT 31.0* 27.0* 26.3*  PLT 203 173 175     Recent Labs  09/05/16 0900 09/06/16 0310 09/06/16 1114 09/07/16 0510  NA 143 143  --  140  K 3.2* 3.8  --  4.5  CL 115* 116*  --  113*  CO2 22 20*  --  21*  GLUCOSE 228* 102*  --  169*  BUN 21* 23*  --  39*  CREATININE 1.11 1.25*  --  1.75*  CALCIUM 8.0* 7.9*  --  7.9*  MG 1.4* 1.9  --  2.1  PHOS 2.8 3.5  --  5.4*  PROT  --  5.4*  --   --   ALBUMIN  --  1.9*  --   --   AST  --  24  --   --   ALT  --  22  --   --   ALKPHOS  --  57  --   --   BILITOT  --  0.8  --   --   TRIG  --  75 374*  --    Estimated Creatinine Clearance: 31.1 mL/min (by C-G formula based on SCr of 1.75 mg/dL (H)).    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 The patient is noted to have an ileus post-op and pharmacy  has been consulted to start TPN for nutrition. TPN started 10/21.   GI: Post-op ileus resolved but pt failed swallow eval 10/22, then intubated 10/23 for probable aspiration. LBM 10/21. NGO 2300 mls; onscheduled Reglan IV, Pepcid IV Endo:Hx DM. CBGs 95 - 198;  insulin 30 units/bag in TPN,  14 units SSI.    Lytes: K up to 4.5,  Mg 2.1; Phos slightly elevated at 5.4. CoCa 9.52, Caxphos = 52, goal < 55. Renal:SCr 1.1>1.25>>1,75 , CrCl~31 ml/min. UOP 805 ml, 0.6 ml/kg/hr, I/O -624,  Pulm: intubated 10/23 due to probable aspiration Cards: Hx Afib- full dose LMWH changed to VTE px sq hep 10/24,  Amlodipine, Clonidine TTS patch, Hydralazine prn, Cozaar, Metoprolol prn, Propafenone- not getting PO meds 2nd intubated; IV cardene stopped 10/23 prior to intubation w/ propofol sedation Hepatobil:LFT WNL,  Tbili wnl. TG 190>374 with start of propofol 10/23, Palb 5.6- inflammatory state/post-op  Neuro:  Transferred to ICU 10/21 2nd AMS, neuro consult.  EEG- diffuse encephalopathy, no sz activity.  MRI- mod atrophy & old infarcts  ZO:[id number], WBC 21.1 - trending up, on Vanc/Zosyn (10/21 >> ) for R/O aspiration pna  Best Practices:sq hep TPN Access:10/18 TPN start date:10/19 >>  Nutritional Goals (per RD recommendation on 10/12): KCal:1800-2000 Protein: 80-95g  Goal Rate:  Clinimix E 5/20at 79ml/hr and 20% lipids at 10 ml/hr- providing 1853kcal and 78g protein, meeting 100% caloric goals and 98% protein goals Propofol at 7.2 ml/hr providing ~ 173 kcal/day  Current Nutrition: NPO, TPN, propofol  Plan: Take out electrolytes-  Change to Clinimix 5/15 without lytes at 8ml/hr and stop Intralipid 70ml/hr since propofol at 7.2 ml/hr providing ~ 173 kcals and trig up to 374  TPN will provide 1108 kcals and 78 gm protein  TPN + propofol provides 1281 kcal and 78 gm protine for ~ 71% kcal and 98% protein goals, did not increase TPN rate due to MD request on 10/24 to limit  IVF continue Trace elements only to TPN, no MVI d/t facial swelling with Niacin increase insulin in TPN to 35 units DC pepcid 20 IV BID and add 20 units pepcid to bag Continue Moderate SSI Check labs in AM- bmet, mag, phos   Herby Abraham, Pharm.D. [phone] 09/07/2016 9:03 AM

## 2016-09-08 ENCOUNTER — Inpatient Hospital Stay (HOSPITAL_COMMUNITY): Payer: Medicare Other

## 2016-09-08 DIAGNOSIS — R059 Cough, unspecified: Secondary | ICD-10-CM

## 2016-09-08 DIAGNOSIS — I7123 Aneurysm of the descending thoracic aorta, without rupture: Secondary | ICD-10-CM

## 2016-09-08 DIAGNOSIS — R05 Cough: Secondary | ICD-10-CM

## 2016-09-08 DIAGNOSIS — I712 Thoracic aortic aneurysm, without rupture: Secondary | ICD-10-CM

## 2016-09-08 LAB — URINALYSIS, ROUTINE W REFLEX MICROSCOPIC
BILIRUBIN URINE: NEGATIVE
GLUCOSE, UA: NEGATIVE mg/dL
HGB URINE DIPSTICK: NEGATIVE
Ketones, ur: NEGATIVE mg/dL
Nitrite: NEGATIVE
PH: 5.5 (ref 5.0–8.0)
Protein, ur: NEGATIVE mg/dL
SPECIFIC GRAVITY, URINE: 1.01 (ref 1.005–1.030)

## 2016-09-08 LAB — CBC WITH DIFFERENTIAL/PLATELET
BASOS ABS: 0 10*3/uL (ref 0.0–0.1)
BASOS PCT: 0 %
Eosinophils Absolute: 1.1 10*3/uL — ABNORMAL HIGH (ref 0.0–0.7)
Eosinophils Relative: 3 %
HCT: 23 % — ABNORMAL LOW (ref 39.0–52.0)
HEMOGLOBIN: 7.5 g/dL — AB (ref 13.0–17.0)
LYMPHS PCT: 4 %
Lymphs Abs: 1.5 10*3/uL (ref 0.7–4.0)
MCH: 31.1 pg (ref 26.0–34.0)
MCHC: 32.6 g/dL (ref 30.0–36.0)
MCV: 95.4 fL (ref 78.0–100.0)
MONOS PCT: 4 %
Monocytes Absolute: 1.5 10*3/uL — ABNORMAL HIGH (ref 0.1–1.0)
NEUTROS ABS: 33.7 10*3/uL — AB (ref 1.7–7.7)
NEUTROS PCT: 89 %
Platelets: 121 10*3/uL — ABNORMAL LOW (ref 150–400)
RBC: 2.41 MIL/uL — ABNORMAL LOW (ref 4.22–5.81)
RDW: 17.2 % — ABNORMAL HIGH (ref 11.5–15.5)
WBC: 37.8 10*3/uL — ABNORMAL HIGH (ref 4.0–10.5)

## 2016-09-08 LAB — GLUCOSE, CAPILLARY
GLUCOSE-CAPILLARY: 97 mg/dL (ref 65–99)
Glucose-Capillary: 103 mg/dL — ABNORMAL HIGH (ref 65–99)
Glucose-Capillary: 138 mg/dL — ABNORMAL HIGH (ref 65–99)
Glucose-Capillary: 75 mg/dL (ref 65–99)
Glucose-Capillary: 93 mg/dL (ref 65–99)
Glucose-Capillary: 99 mg/dL (ref 65–99)

## 2016-09-08 LAB — LACTIC ACID, PLASMA: LACTIC ACID, VENOUS: 0.9 mmol/L (ref 0.5–1.9)

## 2016-09-08 LAB — MAGNESIUM: Magnesium: 1.8 mg/dL (ref 1.7–2.4)

## 2016-09-08 LAB — COMPREHENSIVE METABOLIC PANEL
ALBUMIN: 1.5 g/dL — AB (ref 3.5–5.0)
ALT: 26 U/L (ref 17–63)
ANION GAP: 6 (ref 5–15)
AST: 47 U/L — ABNORMAL HIGH (ref 15–41)
Alkaline Phosphatase: 60 U/L (ref 38–126)
BUN: 40 mg/dL — ABNORMAL HIGH (ref 6–20)
CO2: 21 mmol/L — AB (ref 22–32)
Calcium: 7.8 mg/dL — ABNORMAL LOW (ref 8.9–10.3)
Chloride: 113 mmol/L — ABNORMAL HIGH (ref 101–111)
Creatinine, Ser: 1.47 mg/dL — ABNORMAL HIGH (ref 0.61–1.24)
GFR calc non Af Amer: 45 mL/min — ABNORMAL LOW (ref >60)
GFR, EST AFRICAN AMERICAN: 52 mL/min — AB (ref >60)
GLUCOSE: 96 mg/dL (ref 65–99)
POTASSIUM: 4 mmol/L (ref 3.5–5.1)
SODIUM: 140 mmol/L (ref 135–145)
Total Bilirubin: 0.8 mg/dL (ref 0.3–1.2)
Total Protein: 5.1 g/dL — ABNORMAL LOW (ref 6.5–8.1)

## 2016-09-08 LAB — URINE MICROSCOPIC-ADD ON: Bacteria, UA: NONE SEEN

## 2016-09-08 LAB — PROTIME-INR
INR: 1.21
Prothrombin Time: 15.3 seconds — ABNORMAL HIGH (ref 11.4–15.2)

## 2016-09-08 LAB — PROCALCITONIN: PROCALCITONIN: 3.27 ng/mL

## 2016-09-08 LAB — APTT: APTT: 44 s — AB (ref 24–36)

## 2016-09-08 LAB — LACTATE DEHYDROGENASE: LDH: 259 U/L — AB (ref 98–192)

## 2016-09-08 LAB — PHOSPHORUS: Phosphorus: 4.1 mg/dL (ref 2.5–4.6)

## 2016-09-08 MED ORDER — SODIUM CHLORIDE 0.9% FLUSH: 10.0000 mL | INTRAVENOUS | Status: DC | PRN

## 2016-09-08 MED ORDER — TRACE MINERALS CR-CU-MN-SE-ZN 10-1000-500-60 MCG/ML IV SOLN
INTRAVENOUS | Status: AC
  Administered 2016-09-08: 18:00:00 via INTRAVENOUS
  Filled 2016-09-08: qty 1800

## 2016-09-08 MED ORDER — HYDRALAZINE HCL 20 MG/ML IJ SOLN
10.0000 mg | INTRAMUSCULAR | Status: DC
  Administered 2016-09-08 – 2016-09-09 (×7): 10 mg via INTRAVENOUS
  Filled 2016-09-08 (×5): qty 1

## 2016-09-08 NOTE — Progress Notes (Signed)
PCCM PROGRESS NOTE  Admission date: 08/15/2016 Consult date: 09/04/2016 Referring provider: Dr. Myra Gianotti, VVS  CC: altered mental status  Subjective: WBC up, had CT abdo  Vital signs: BP (!) 174/62   Pulse 84   Temp 99.3 F (37.4 C) (Oral)   Resp (!) 23   Ht 5\' 9"  (1.753 m) Comment: Simultaneous filing. User may not have seen previous data.  Wt 139 lb 5.3 oz (63.2 kg)   SpO2 100%   BMI 20.58 kg/m   Intake/output: I/O last 3 completed shifts: In: 5197.5 [I.V.:3667.5; NG/GT:620; IV Piggyback:910] Out: 3890 [Urine:2290; Emesis/NG output:1600]  General: calm, NAD Neuro: follows simple commands, rass zero HEENT: jvd wnl or low Cardiac: s1 s2 rrt no murmur Chest ronchi improved Abd: soft, non tender, distended mild no changes, no BS, no tenderness Ext: no edema Skin: no rashes   CMP Latest Ref Rng & Units 09/08/2016 09/07/2016 09/06/2016  Glucose 65 - 99 mg/dL 96 161(W) 960(A)  BUN 6 - 20 mg/dL 54(U) 98(J) 19(J)  Creatinine 0.61 - 1.24 mg/dL 4.78(G) 9.56(O) 1.30(Q)  Sodium 135 - 145 mmol/L 140 140 143  Potassium 3.5 - 5.1 mmol/L 4.0 4.5 3.8  Chloride 101 - 111 mmol/L 113(H) 113(H) 116(H)  CO2 22 - 32 mmol/L 21(L) 21(L) 20(L)  Calcium 8.9 - 10.3 mg/dL 7.8(L) 7.9(L) 7.9(L)  Total Protein 6.5 - 8.1 g/dL 5.1(L) - 5.4(L)  Total Bilirubin 0.3 - 1.2 mg/dL 0.8 - 0.8  Alkaline Phos 38 - 126 U/L 60 - 57  AST 15 - 41 U/L 47(H) - 24  ALT 17 - 63 U/L 26 - 22    CBC Latest Ref Rng & Units 09/08/2016 09/06/2016 09/05/2016  WBC 4.0 - 10.5 K/uL 37.8(H) 21.1(H) 15.3(H)  Hemoglobin 13.0 - 17.0 g/dL 7.5(L) 8.6(L) 8.9(L)  Hematocrit 39.0 - 52.0 % 23.0(L) 26.3(L) 27.0(L)  Platelets 150 - 400 K/uL 121(L) 175 173    ABG    Component Value Date/Time   PHART 7.338 (L) 09/06/2016 1217   PCO2ART 36.9 09/06/2016 1217   PO2ART 272.0 (H) 09/06/2016 1217   HCO3 19.6 (L) 09/06/2016 1217   TCO2 21 09/06/2016 1217   ACIDBASEDEF 5.0 (H) 09/06/2016 1217   O2SAT 100.0 09/06/2016 1217     CBG (last 3)   Recent Labs  09/07/16 1952 09/07/16 2349 09/08/16 0402  GLUCAP 120* 123* 97     Imaging: Ct Abdomen Pelvis Wo Contrast  Result Date: 09/08/2016 CLINICAL DATA:  75 year old male status post recent thoracic aortic aneurysm repair with stent placement on 08/29/2016 with possible postop ileus. EXAM: CT ABDOMEN AND PELVIS WITHOUT CONTRAST TECHNIQUE: Multidetector CT imaging of the abdomen and pelvis was performed following the standard protocol without IV contrast. COMPARISON:  Abdominal radiograph dated 09/06/2016 and 09/05/2016. Chest CT dated 07/05/2016 FINDINGS: Evaluation of this exam is limited in the absence of intravenous contrast. Evaluation is also limited due to streak artifact caused by patient's arms. Lower chest: Partially visualized small bilateral pleural effusions with associated partial compressive atelectasis of the lower lobes. Areas of consolidative changes in the lower lobes bilaterally as well as ground-glass and nodular densities in the right middle lobe most compatible with multifocal pneumonia. Aspiration is not excluded. Clinical correlation is recommended. There is coronary vascular calcification. There is hypoattenuation of the cardiac blood pool suggestive of a degree of anemia. Clinical correlation is recommended. Partially visualized 6 cm distal descending thoracic aortic aneurysm with endovascular stent graft repair. Evaluation of the aneurysm is limited in the absence of intravenous contrast.  This however appears similar in size compared to the prior chest CT of 07/05/2016. No intra-abdominal free air. There is diffuse mesenteric edema and small free fluid within the pelvis. Hepatobiliary: There multiple stones within the gallbladder. Evaluation of the gallbladder is limited due to diffuse mesenteric edema. Ultrasound may provide better evaluation if there is clinical concern for acute cholecystitis. The liver is grossly unremarkable. Pancreas: The  pancreas is grossly unremarkable on limited evaluation. No dilatation of the pancreatic duct or gland atrophy. Spleen: Normal in size without focal abnormality. Adrenals/Urinary Tract: The adrenal glands appear unremarkable. There is moderate left renal atrophy. Bilateral renal vascular calcifications noted. A 3 mm right renal inter pole or calcific density and a 2 mm right renal upper pole probable nonobstructing calculi. There is no hydronephrosis on either side. A Foley catheter is noted within the urinary bladder. The urinary bladder however is mildly distended despite the catheter. Stomach/Bowel: An enteric tube is partially visualized with tip in the body of the stomach. Oral contrast noted within the stomach and multiple loops of small bowel. There is thickened and edematous appearance of distal small bowel concerning for enteritis. The loops of small bowel measure up to 3.3 cm in the left hemi abdomen, likely a degree of ileus. No definite evidence of bowel obstruction. Normal appendix. Vascular/Lymphatic: There is extensive aortoiliac atherosclerotic disease as well as atherosclerotic calcification of the mesenteric vasculature, likely related to underlying diabetes. No portal venous gas identified. No definite adenopathy. Reproductive: The prostate and seminal vesicles are grossly unremarkable. Other: There is a 3.2 x 2.9 cm ovoid soft tissue density in the right hemipelvis adjacent to the bifurcation of the right iliac artery (series 2, image 56). There is a central area air density within this structure. This structure is not well evaluated on the CT. This structure appears to be retroperitoneal and therefore less likely represents a loop of bowel. A focal hematoma, an enlarged lymph node with central necrosis are possibilities. A vascular lesion such as pseudo aneurysm is less likely. A neoplastic process is not excluded. Clinical correlation is recommended. Follow-up with CT after resolution of the  acute intra-abdominal process is recommended. Bilateral inguinal surgical clips with severe atherosclerotic calcification of the vessels. Partially visualized left inguinal bypass graft. Musculoskeletal: There is osteopenia with extensive degenerative changes of the spine as well as bilateral hip osteoarthritic changes. No acute fracture. IMPRESSION: Multifocal pneumonia with small bilateral pleural effusions. Thickened appearance of the distal small bowel most compatible with enteritis. There is probable associated mild ileus without evidence of obstruction. Correlation with clinical exam and stool cultures recommended. Diffuse mesenteric and soft tissue stranding, edema and anasarca. Ovoid appearing soft tissue density with central air or cavitation along the right iliac artery bifurcation, incompletely evaluated but appears to be retroperitoneal and may represent a hematoma, an enlarged lymph node, a neoplasm, or less likely a vascular pathology suggest pseudo aneurysm. Correlation with clinical exam and follow-up with CT after resolution of acute intra-abdominal process recommended. Extensive vasculopathy. Electronically Signed   By: Elgie Collard M.D.   On: 09/08/2016 06:09   Dg Abd 1 View  Result Date: 09/06/2016 CLINICAL DATA:  OG tube placement EXAM: ABDOMEN - 1 VIEW COMPARISON:  None. FINDINGS: Orogastric tube with the tip projecting over the stomach. Descending thoracic aortic stent graft noted. There is no bowel dilatation to suggest obstruction. There is no evidence of pneumoperitoneum, portal venous gas or pneumatosis. There are no pathologic calcifications along the expected course of the ureters.  There is lumbar spine spondylosis. There is bilateral lower lobe airspace disease. IMPRESSION: Orogastric tube with the tip projecting over the stomach. Electronically Signed   By: Elige KoHetal  Patel   On: 09/06/2016 10:53   Portable Chest Xray  Result Date: 09/07/2016 CLINICAL DATA:  Aspiration  pneumonia, intubated. EXAM: PORTABLE CHEST 1 VIEW COMPARISON:  09/06/2016 FINDINGS: Support devices including endotracheal tube are stable. Heart is normal size. There is hyperinflation of the lungs compatible with COPD. Improving perihilar and lower lobe opacities, likely improving edema or infection. Suspect small effusions, also improved. IMPRESSION: Improving bilateral perihilar and lower lobe airspace opacities and bilateral effusions. Electronically Signed   By: Charlett NoseKevin  Dover M.D.   On: 09/07/2016 08:58   Portable Chest Xray  Result Date: 09/06/2016 CLINICAL DATA:  Intubation, aspiration pneumonia EXAM: PORTABLE CHEST 1 VIEW COMPARISON:  09/06/2016 FINDINGS: Endotracheal tube is 7 cm above the carina. Right PICC line tip is in the SVC, unchanged. NG tube enters the stomach. Prior descending thoracic aortic repair. Heart is normal size. Bilateral lower lobe airspace opacities with small bilateral effusions. No real change since prior study. IMPRESSION: Bilateral lower lobe airspace disease with small effusions, not significantly changed. Endotracheal tube 7 cm above the carina. Electronically Signed   By: Charlett NoseKevin  Dover M.D.   On: 09/06/2016 10:51   Studies: CT head 10/20 >> atrophy, old infarcts, chronic small vessel ischemic disease EEG 10/21 >> diffuse encephalopathy  MRI brain 10/21 >> moderate atrophy, old infarcts CT abdo 10/24- ileus, no obstruction, enteritis small bowels, PNA  Cultures: Blood 10/21 >> Sputum 10/23>>>  Antibiotics: Vancomycin 10/21 >> Zosyn 10/21 >>   Significant events: 10/11  Admit for endovascular stent of thoracic aortic aneurysm  10/14  Ileus, HTN 10/20  Pt pulled NGT out  10/21  Encephalopathy 10/23- asp event likely, distress, hypoxia - ETT  Summary: 75 yo male smoker admitted 10/11 for planned endovascular thoracic aortic stent.  Post op course complicated by HTN, AKI, ileus.  He developed altered mental status, progressive HTN and fever 10/21 and PCCM  was asked to assist with medical management in ICU.  PMHx of Anemia, GERD with Barrett's esophagus, Hiatal hernia, Anxiety, Gout, Chronic back pain, CVA, A fib on coumadin, CAD, DM, COPD, DVT.   Assessment/plan:  ARF,. Asp event, hypoxia -pcxr is stable.  -even balance goals to positive On pressure support now. Try to wean when possible cpap 5 ps5  Acute metabolic encephalopathy, neg mri, eeg delirim  - avoid benzo -prop, int fent -WUA mandatory  ID Fever, asp event Leukocytosis ( likely deamrg,)  - continued abx regimen. Could d/c vanc today?  HTN. Hx of A fib. Sow SR - cardene gtt for goal SBP < 160, not needed on prop - continue catapres patch - cont scheduled labetolol IV- held for brady  S/p endovascular stent to thoracic aorta. - post-op care per VVS -abdo soft, ileus remains -NGT to suction should remain -reglan increase -add enema attempts as he had 3 BM prior, more likely in rectum  Ileus >> improved. Dysphagia. Nutrition. - continue reglan and increase - TPN continued for now as still having some NGT output.  -enema, reglan -follow output from ngt  Anemia of critical illness. - hgb trending down to 7.5. Lov dc with clearance, add sub q hep  DM type II. - SSI -reglan  COPD. - brovana, pulmicort and prn duoneb Lower peep successful -avoid high rates  ARF,ATN, hypovolemia? hyperChloremia (TPN also a contribute) No lasix Pos balance goal  Unable to manipulate Cl and acetate myself with this tpn formula  Tasrif Ahmed, PGY-3.  STAFF NOTE: I, Rory Percy, MD FACP have personally reviewed patient's available data, including medical history, events of note, physical examination and test results as part of my evaluation. I have discussed with resident/NP and other care providers such as pharmacist, RN and RRT. In addition, I personally evaluated patient and elicited key findings of: on vent, less coarse BS, abdo about same clinically, WBC noted,  CT re assuring, I think the bowel edema is nontransmural ischemia, assess ldh, lactic, no feeds, TPN, wean cpap 5 ps5, goal 2 hours, would not extubate with current clinical course, consider dc vanc in am pending clinical course, PCT algorythm, UA needed, repeat BC with WBC noted on TPN, low threshold for empiric antifungals- added, some brady overnight holding BB, add hydral q4h, prognosis concerning, with CT no obstruction can increase reglan, I updated wife at bedside The patient is critically ill with multiple organ systems failure and requires high complexity decision making for assessment and support, frequent evaluation and titration of therapies, application of advanced monitoring technologies and extensive interpretation of multiple databases.   Critical Care Time devoted to patient care services described in this note is 35 Minutes. This time reflects time of care of this signee: Rory Percy, MD FACP. This critical care time does not reflect procedure time, or teaching time or supervisory time of PA/NP/Med student/Med Resident etc but could involve care discussion time. Rest per NP/medical resident whose note is outlined above and that I agree with   Mcarthur Rossetti. Tyson Alias, MD, FACP Pgr: [phone] [facility] 09/08/2016 9:33 AM  \

## 2016-09-08 NOTE — Progress Notes (Deleted)
      [address]       [place] [id number]             [phone]      14 Days Post-Op Procedure(s) (LRB): THORACIC AORTIC ENDOVASCULAR STENT GRAFT WITH RETROPERITONEAL EXPOSURE (N/A)   Subjective:   Remains on ventilator.  Awake and responds to commands.  He denies pain.  Objective: Vital signs in last 24 hours: Temp:  [98.3 F (36.8 C)-99.6 F (37.6 C)] 99.6 F (37.6 C) (10/25 0904) Pulse Rate:  [65-115] 96 (10/25 1000) Cardiac Rhythm: Normal sinus rhythm;Atrial fibrillation (10/25 0745) Resp:  [18-26] 24 (10/25 1000) BP: (113-175)/(44-67) 172/56 (10/25 1000) SpO2:  [93 %-100 %] 96 % (10/25 1000) FiO2 (%):  [40 %] 40 % (10/25 0803) Weight:  [139 lb 5.3 oz (63.2 kg)] 139 lb 5.3 oz (63.2 kg) (10/25 0500)  Intake/Output from previous day: 10/24 0701 - 10/25 0700 In: 4022.8 [I.V.:2802.8; NG/GT:560; IV Piggyback:660] Out: 2730 [Urine:1930; Emesis/NG output:800] Intake/Output this shift: Total I/O In: 385.8 [I.V.:355.8; NG/GT:30] Out: 940 [Urine:740; Emesis/NG output:200]  General appearance: alert, cooperative and no distress Heart: regular rate and rhythm Lungs: mild rhonchi bilaterally Abdomen: soft, non-tender; bowel sounds normal; no masses,  no organomegaly Wound: clean and dry  Lab Results:  Recent Labs  09/06/16 0310 09/08/16 0419  WBC 21.1* 37.8*  HGB 8.6* 7.5*  HCT 26.3* 23.0*  PLT 175 121*   BMET:  Recent Labs  09/07/16 0510 09/08/16 0419  NA 140 140  K 4.5 4.0  CL 113* 113*  CO2 21* 21*  GLUCOSE 169* 96  BUN 39* 40*  CREATININE 1.75* 1.47*  CALCIUM 7.9* 7.8*    PT/INR:  Recent Labs  09/08/16 0419  LABPROT 15.3*  INR 1.21   ABG    Component Value Date/Time   PHART 7.338 (L) 09/06/2016 1217   HCO3 19.6 (L) 09/06/2016 1217   TCO2 21 09/06/2016 1217   ACIDBASEDEF 5.0 (H) 09/06/2016 1217   O2SAT 100.0 09/06/2016 1217   CBG (last 3)   Recent Labs  09/07/16 2349 09/08/16 0402 09/08/16 0901  GLUCAP 123* 97  75    Assessment/Plan: S/P Procedure(s) (LRB): THORACIC AORTIC ENDOVASCULAR STENT GRAFT WITH RETROPERITONEAL EXPOSURE (N/A)  1. Pulm- on vent after suspected aspiration yesterday--- awake responds to commands, weaning per CCM... CXR with worsening air space disease 2. ID- leukocytosis continues to rise, up to 37K, UA is negative from this morning... On Vanc/Zosyn, and Eraxis for funal coverage 3. CV- Bradycardic at times, Hypertensive this morning..continue Hydralazine, Cozaar, Catapres, Norvasc 4. GI- continue TPN 5. Dispo- re-intubated for suspected aspiration yesterday, continue vent wean per CCM, on broad spectrum ABX, continue BP control, continue current care   LOS: 14 days    BARRETT, ERIN 09/08/2016  Neuro ok, remaons on vent per ccm I have seen and examined Debroah BallerArchie L Quakenbush and agree with the above assessment  and plan.  Delight OvensEdward B Sameen Leas MD [facility] [phone] Office [phone] 09/08/2016 8:34 PM

## 2016-09-08 NOTE — Progress Notes (Signed)
SLP Cancellation Note  Patient Details Name: [name] MRN: [id number] DOB: Jan 22, 1941   Cancelled treatment:       Reason Eval/Treat Not Completed: Medical issues which prohibited therapy. Pt remains intubated. SLP to sign off - please re-order when ready for swallow evaluation.   [name], [name] 09/08/2016, 9:41 AM  [name], M.A. CCC-SLP [phone]

## 2016-09-08 NOTE — Progress Notes (Signed)
Progress Note    09/08/2016 8:33 AM 14 Days Post-Op  Subjective:  Intubated but awakes and follows commands  Tm 99.3 HR  60's-80's NSR 120's-170's systolic 100% .[id number]  Abx: Vancomycin Zosyn Receiving Eraxis for yeast in respiratory cx  Gtts: Propofol   Vitals:   09/08/16 0745 09/08/16 0803  BP:  (!) 174/62  Pulse: 65 84  Resp: (!) 25 (!) 23  Temp:      Physical Exam: Cardiac:  regular Lungs:  intubated Incisions:  Healing nicely Extremities:  3+ left PT; faintly palpable right DP; bilateral feet are warm Abdomen:  Soft, NT/ND Neuro: following commands and moving all extremities.   CBC    Component Value Date/Time   WBC 37.8 (H) 09/08/2016 0419   RBC 2.41 (L) 09/08/2016 0419   HGB 7.5 (L) 09/08/2016 0419   HCT 23.0 (L) 09/08/2016 0419   PLT 121 (L) 09/08/2016 0419   MCV 95.4 09/08/2016 0419   MCH 31.1 09/08/2016 0419   MCHC 32.6 09/08/2016 0419   RDW 17.2 (H) 09/08/2016 0419   LYMPHSABS 1.5 09/08/2016 0419   MONOABS 1.5 (H) 09/08/2016 0419   EOSABS 1.1 (H) 09/08/2016 0419   BASOSABS 0.0 09/08/2016 0419    BMET    Component Value Date/Time   NA 140 09/08/2016 0419   K 4.0 09/08/2016 0419   CL 113 (H) 09/08/2016 0419   CO2 21 (L) 09/08/2016 0419   GLUCOSE 96 09/08/2016 0419   BUN 40 (H) 09/08/2016 0419   CREATININE 1.47 (H) 09/08/2016 0419   CALCIUM 7.8 (L) 09/08/2016 0419   GFRNONAA 45 (L) 09/08/2016 0419   GFRAA 52 (L) 09/08/2016 0419    INR    Component Value Date/Time   INR 1.21 09/08/2016 0419     Intake/Output Summary (Last 24 hours) at 09/08/16 0833 Last data filed at 09/08/16 0800  Gross per 24 hour  Intake           4089.2 ml  Output             2595 ml  Net           1494.2 ml   Blood cx:   No growth 09/04/16 Cx from 09/07/16 is in process  Respiratory Cx 09/07/16: Specimen Description TRACHEAL ASPIRATE   Special Requests NONE   Gram Stain FEW WBC PRESENT,BOTH PMN AND MONONUCLEAR MODERATE YEAST   Culture PENDING    Report Status PENDING    NGT output:  560cc/24hr  Assessment:  75 y.o. male is s/p:  stent graft repair descending thoracic aortic aneurysm with retroperitoneal exposure.   14 Days Post-Op  Plan: -pt awakes and follows commands this morning -leukocytosis worsening at 37k, which is up from 21k.  Pt receiving vanc/zosyn and Eraxis for yeast on respiratory cx. No urine sent-will send u/a and culture.  Worsening CXR this am most likely source of white count -has labetalol scheduled, it was given last pm and he had a couple of bradycardic episodes so it is being held for now and using hydralazine.  -DVT prophylaxis:  SQ heparin  -intubated-vent management per CCM -nutrition:  TPN   [name] MassedSamantha Rhyne, PA-C [facility] [phone] 09/08/2016 8:33 AM  Agree with the above.  Family updated at bedside Worsening WBC.  On broad abx coverage, including anti-fungal CT shows enteritis, but no evidence of perforation or obstruction, will continue to monitor, no indication for abdominal exploration at this time CXR with pneumonia and effusions, remains intubated, management per CCM, appreciate  assistance Renal function improving  [name]

## 2016-09-08 NOTE — Progress Notes (Signed)
PARENTERAL NUTRITION CONSULT NOTE - FOLLOW UP  Pharmacy Consult for TPN Indication: Prolonged ileus  Allergies  Allergen Reactions  . Lopressor [Metoprolol Tartrate] Palpitations    LOPRESSOR bothers the patient.  . Niacin Swelling    FACIAL EDEMA    Patient Measurements: Height: 5\' 9"  (175.3 cm) (Simultaneous filing. User may not have seen previous data.) Weight: 139 lb 5.3 oz (63.2 kg) IBW/kg (Calculated) : 70.7   Vital Signs: Temp: 99.3 F (37.4 C) (10/25 0400) Temp Source: Oral (10/25 0400) BP: 174/62 (10/25 0803) Pulse Rate: 84 (10/25 0803) Intake/Output from previous day: 10/24 0701 - 10/25 0700 In: 3900.6 [I.V.:2680.6; NG/GT:560; IV Piggyback:660] Out: 2730 [Urine:1930; Emesis/NG output:800] Intake/Output from this shift: Total I/O In: 30 [NG/GT:30] Out: -   Labs:  Recent Labs  09/06/16 0310 09/08/16 0419  WBC 21.1* 37.8*  HGB 8.6* 7.5*  HCT 26.3* 23.0*  PLT 175 121*  APTT  --  44*  INR  --  1.21     Recent Labs  09/06/16 0310 09/06/16 1114 09/07/16 0510 09/08/16 0419  NA 143  --  140 140  K 3.8  --  4.5 4.0  CL 116*  --  113* 113*  CO2 20*  --  21* 21*  GLUCOSE 102*  --  169* 96  BUN 23*  --  39* 40*  CREATININE 1.25*  --  1.75* 1.47*  CALCIUM 7.9*  --  7.9* 7.8*  MG 1.9  --  2.1 1.8  PHOS 3.5  --  5.4* 4.1  PROT 5.4*  --   --  5.1*  ALBUMIN 1.9*  --   --  1.5*  AST 24  --   --  47*  ALT 22  --   --  26  ALKPHOS 57  --   --  60  BILITOT 0.8  --   --  0.8  TRIG 75 374*  --   --    Estimated Creatinine Clearance: 38.8 mL/min (by C-G formula based on SCr of 1.47 mg/dL (H)).    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 The patient is noted to have an ileus post-op and pharmacy has been consulted to start  TPN for nutrition. TPN started 10/21.   GI: Post-op ileus had resolved but pt failed swallow eval 10/22, then intubated 10/23 for probable aspiration. LBM 10/21. NGO 900 mls; onscheduled Reglan IV, Pepcid IV 10/24 CT abd/pelvis: d/w enteritis, mild ileus w/ no evidence of obstruction Endo:Hx DM. CBGs well controlled with insulin 35 units/bag in TPN,  7 units SSI.    Lytes: lytes taken out of TPN 10/24: K 4,  Mg 1.8; Phos 4.1. CoCa 9.8, Caxphos = 40, goal < 55. Renal:SCr 1.1>1.25>>1.75>1.47 , CrCl~39 ml/min. UOP 1505 ml, improved; has IVF at 50 ml/hr Pulm: intubated 10/23 due to probable aspiration Cards: Hx Afib- full dose LMWH changed to VTE px sq hep 10/24,  Amlodipine, Clonidine TTS patch, Hydralazine prn, Cozaar, Metoprolol prn, Propafenone- not getting PO meds 2nd intubated; IV cardene stopped 10/23 prior to intubation w/ propofol sedation Hepatobil:LFT WNL x AST slightly elevated,  Tbili wnl. TG 190>374 with start of propofol 10/23, Palb 5.6- inflammatory state/post-op  Neuro:  Transferred to ICU 10/21 2nd AMS, neuro consult.  EEG- diffuse  encephalopathy, no sz activity.  MRI- mod atrophy & old infarcts ZO:XWRUEAVW:Afebrile, WBC 21.1>37.8 - trending up,  eraxis added to vanc/zosyn;  aspiration pna CT 10/24 multifocal PNA  Vanc/Zosyn 10/21>> anidulafungin 10/24>> Best Practices:sq hep TPN Access:10/18 TPN start date:10/19 >>  Nutritional Goals (per RD recommendation on 10/23): [id number] Protein: 80-95g  Goal Rate:  Clinimix E 5/20at 4775ml/hr and 20% lipids at 10 ml/hr on 4 days a week, TTSS providing a weekly average of 1858 kcal and 90g protein, meeting 100% caloric goals and 100% protein goals Propofol at 3.6 ml/hr providing ~ 95 kcal/day  Current Nutrition: NPO, TPN, propofol  Plan: Add back electrolytes to TPN Change to Clinimix E 5/20 and increase rate to 6875ml/hr to meet protein goals. no lipids again today 2nd to propofol at 3.6 ml/hr providing ~ 95 kcals  and trig up to 374 on 10/23 - Trig to be drawn again 10/26 per propofol protocol TPN will provide 1584 kcals and 90 gm protein  TPN + propofol provides 1679 kcal and 90 gm protein continue Trace elements only to TPN, no MVI d/t facial swelling with Niacin continue insulin in TPN 35 units Continue 20 units pepcid  Continue Moderate SSI TPN labs Mon/Th  Herby AbrahamMichelle T. Darya Bigler, Pharm.D. [phone] 09/08/2016 8:28 AM

## 2016-09-09 ENCOUNTER — Inpatient Hospital Stay (HOSPITAL_COMMUNITY): Payer: Medicare Other

## 2016-09-09 ENCOUNTER — Inpatient Hospital Stay (HOSPITAL_COMMUNITY): Payer: Medicare Other | Admitting: Certified Registered"

## 2016-09-09 LAB — CBC WITH DIFFERENTIAL/PLATELET
BASOS PCT: 0 %
Basophils Absolute: 0 10*3/uL (ref 0.0–0.1)
EOS ABS: 0.9 10*3/uL — AB (ref 0.0–0.7)
Eosinophils Relative: 3 %
HCT: 23.7 % — ABNORMAL LOW (ref 39.0–52.0)
Hemoglobin: 8 g/dL — ABNORMAL LOW (ref 13.0–17.0)
LYMPHS ABS: 1.5 10*3/uL (ref 0.7–4.0)
Lymphocytes Relative: 5 %
MCH: 32 pg (ref 26.0–34.0)
MCHC: 33.8 g/dL (ref 30.0–36.0)
MCV: 94.8 fL (ref 78.0–100.0)
MONO ABS: 1.5 10*3/uL — AB (ref 0.1–1.0)
Monocytes Relative: 5 %
NEUTROS ABS: 25.5 10*3/uL — AB (ref 1.7–7.7)
Neutrophils Relative %: 87 %
PLATELETS: 117 10*3/uL — AB (ref 150–400)
RBC: 2.5 MIL/uL — ABNORMAL LOW (ref 4.22–5.81)
RDW: 17.3 % — ABNORMAL HIGH (ref 11.5–15.5)
WBC: 29.4 10*3/uL — ABNORMAL HIGH (ref 4.0–10.5)

## 2016-09-09 LAB — BLOOD GAS, ARTERIAL
ACID-BASE DEFICIT: 4.5 mmol/L — AB (ref 0.0–2.0)
Bicarbonate: 19.8 mmol/L — ABNORMAL LOW (ref 20.0–28.0)
DRAWN BY: 42624
FIO2: 0.4
MECHVT: 550 mL
O2 SAT: 96.1 %
PEEP/CPAP: 5 cmH2O
PH ART: 7.374 (ref 7.350–7.450)
PO2 ART: 87.3 mmHg (ref 83.0–108.0)
Patient temperature: 98.6
RATE: 18 resp/min
pCO2 arterial: 34.8 mmHg (ref 32.0–48.0)

## 2016-09-09 LAB — COMPREHENSIVE METABOLIC PANEL
ALBUMIN: 1.4 g/dL — AB (ref 3.5–5.0)
ALBUMIN: 1 g/dL — AB (ref 3.5–5.0)
ALK PHOS: 88 U/L (ref 38–126)
ALK PHOS: 80 U/L (ref 38–126)
ALT: 27 U/L (ref 17–63)
ALT: 36 U/L (ref 17–63)
AST: 39 U/L (ref 15–41)
AST: 55 U/L — ABNORMAL HIGH (ref 15–41)
Anion gap: 4 — ABNORMAL LOW (ref 5–15)
Anion gap: 5 (ref 5–15)
BILIRUBIN TOTAL: 1.1 mg/dL (ref 0.3–1.2)
BUN: 37 mg/dL — AB (ref 6–20)
BUN: 35 mg/dL — ABNORMAL HIGH (ref 6–20)
CALCIUM: 7.9 mg/dL — AB (ref 8.9–10.3)
CALCIUM: 6.8 mg/dL — AB (ref 8.9–10.3)
CO2: 22 mmol/L (ref 22–32)
CO2: 19 mmol/L — AB (ref 22–32)
CREATININE: 1.17 mg/dL (ref 0.61–1.24)
Chloride: 113 mmol/L — ABNORMAL HIGH (ref 101–111)
Chloride: 104 mmol/L (ref 101–111)
Creatinine, Ser: 1.27 mg/dL — ABNORMAL HIGH (ref 0.61–1.24)
GFR calc Af Amer: >60 mL/min (ref >60)
GFR calc Af Amer: >60 mL/min (ref >60)
GFR calc non Af Amer: 59 mL/min — ABNORMAL LOW (ref >60)
GFR, EST NON AFRICAN AMERICAN: 54 mL/min — AB (ref >60)
GLUCOSE: 160 mg/dL — AB (ref 65–99)
GLUCOSE: 189 mg/dL — AB (ref 65–99)
POTASSIUM: 3.8 mmol/L (ref 3.5–5.1)
Potassium: 3.3 mmol/L — ABNORMAL LOW (ref 3.5–5.1)
SODIUM: 128 mmol/L — AB (ref 135–145)
Sodium: 139 mmol/L (ref 135–145)
TOTAL PROTEIN: 5.2 g/dL — AB (ref 6.5–8.1)
Total Bilirubin: 0.6 mg/dL (ref 0.3–1.2)
Total Protein: 4.1 g/dL — ABNORMAL LOW (ref 6.5–8.1)

## 2016-09-09 LAB — POCT I-STAT 3, ART BLOOD GAS (G3+)
Acid-base deficit: 6 mmol/L — ABNORMAL HIGH (ref 0.0–2.0)
Bicarbonate: 22.1 mmol/L (ref 20.0–28.0)
O2 SAT: 99 %
PCO2 ART: 58.1 mmHg — AB (ref 32.0–48.0)
PH ART: 7.187 — AB (ref 7.350–7.450)
TCO2: 24 mmol/L (ref 0–100)
pO2, Arterial: 165 mmHg — ABNORMAL HIGH (ref 83.0–108.0)

## 2016-09-09 LAB — CULTURE, BLOOD (ROUTINE X 2): CULTURE: NO GROWTH

## 2016-09-09 LAB — GLUCOSE, CAPILLARY
GLUCOSE-CAPILLARY: 130 mg/dL — AB (ref 65–99)
GLUCOSE-CAPILLARY: 175 mg/dL — AB (ref 65–99)
Glucose-Capillary: 167 mg/dL — ABNORMAL HIGH (ref 65–99)
Glucose-Capillary: 171 mg/dL — ABNORMAL HIGH (ref 65–99)
Glucose-Capillary: 197 mg/dL — ABNORMAL HIGH (ref 65–99)

## 2016-09-09 LAB — CBC
HEMATOCRIT: 19.7 % — AB (ref 39.0–52.0)
Hemoglobin: 6.5 g/dL — CL (ref 13.0–17.0)
MCH: 31.7 pg (ref 26.0–34.0)
MCHC: 33 g/dL (ref 30.0–36.0)
MCV: 96.1 fL (ref 78.0–100.0)
PLATELETS: 101 10*3/uL — AB (ref 150–400)
RBC: 2.05 MIL/uL — ABNORMAL LOW (ref 4.22–5.81)
RDW: 17.6 % — AB (ref 11.5–15.5)
WBC: 21.4 10*3/uL — AB (ref 4.0–10.5)

## 2016-09-09 LAB — CULTURE, RESPIRATORY

## 2016-09-09 LAB — LACTIC ACID, PLASMA: Lactic Acid, Venous: 2.3 mmol/L (ref 0.5–1.9)

## 2016-09-09 LAB — PROCALCITONIN: Procalcitonin: 1.97 ng/mL

## 2016-09-09 LAB — URINE CULTURE: CULTURE: NO GROWTH

## 2016-09-09 LAB — PHOSPHORUS
Phosphorus: 3.7 mg/dL (ref 2.5–4.6)
Phosphorus: 4.7 mg/dL — ABNORMAL HIGH (ref 2.5–4.6)

## 2016-09-09 LAB — LIPASE, BLOOD: Lipase: 33 U/L (ref 11–51)

## 2016-09-09 LAB — CULTURE, BLOOD (SINGLE): CULTURE: NO GROWTH

## 2016-09-09 LAB — AMYLASE: Amylase: 56 U/L (ref 28–100)

## 2016-09-09 LAB — MAGNESIUM
MAGNESIUM: 1.9 mg/dL (ref 1.7–2.4)
Magnesium: 1.6 mg/dL — ABNORMAL LOW (ref 1.7–2.4)

## 2016-09-09 LAB — CULTURE, RESPIRATORY W GRAM STAIN

## 2016-09-09 MED ORDER — NICARDIPINE HCL IN NACL 20-0.86 MG/200ML-% IV SOLN
3.0000 mg/h | INTRAVENOUS | Status: DC
  Administered 2016-09-09 (×3): 3 mg/h via INTRAVENOUS
  Filled 2016-09-09 (×3): qty 200

## 2016-09-09 MED ORDER — POTASSIUM CHLORIDE 10 MEQ/50ML IV SOLN
10.0000 meq | INTRAVENOUS | Status: AC
  Administered 2016-09-09 (×2): 10 meq via INTRAVENOUS
  Filled 2016-09-09: qty 50

## 2016-09-09 MED ORDER — MAGNESIUM SULFATE IN D5W 1-5 GM/100ML-% IV SOLN
1.0000 g | Freq: Once | INTRAVENOUS | Status: AC
  Administered 2016-09-09: 1 g via INTRAVENOUS
  Filled 2016-09-09: qty 100

## 2016-09-09 MED ORDER — FUROSEMIDE 10 MG/ML IJ SOLN
40.0000 mg | Freq: Two times a day (BID) | INTRAMUSCULAR | Status: DC
  Administered 2016-09-09 (×2): 40 mg via INTRAVENOUS
  Filled 2016-09-09 (×2): qty 4

## 2016-09-09 MED ORDER — NICARDIPINE HCL 2.5 MG/ML IV SOLN: 1.0000 ug/kg/min | INTRAVENOUS | Status: DC

## 2016-09-09 MED ORDER — LABETALOL HCL 5 MG/ML IV SOLN
INTRAVENOUS | Status: AC
  Administered 2016-09-09: 10:00:00
  Filled 2016-09-09: qty 4

## 2016-09-09 MED ORDER — LOSARTAN POTASSIUM 50 MG PO TABS
100.0000 mg | ORAL_TABLET | Freq: Every day | ORAL | Status: DC
  Administered 2016-09-09: 100 mg via ORAL
  Filled 2016-09-09: qty 2

## 2016-09-09 MED ORDER — CLINIMIX E/DEXTROSE (5/20) 5 % IV SOLN
INTRAVENOUS | Status: DC
  Administered 2016-09-09: 17:00:00 via INTRAVENOUS
  Filled 2016-09-09: qty 1800

## 2016-09-09 MED ORDER — LABETALOL HCL 5 MG/ML IV SOLN
10.0000 mg | Freq: Once | INTRAVENOUS | Status: AC
Start: 2016-09-09 — End: 2016-09-09
  Administered 2016-09-09: 10 mg via INTRAVENOUS

## 2016-09-10 MED FILL — Medication: Qty: 1 | Status: AC

## 2016-09-10 NOTE — Progress Notes (Signed)
Responded to page when pt passed. Provided praye and emotional/spiritual support to several immediate family members in room with pt, then also to that group with other family members/friends in conference room. Listened and laughedwith them  as they told family stories.. Provided prayer shawl to widow (married to pt 45 yrs) and New Testament/Psalms at their request to several family members. They especially appreciated latter, as a family member was a Geophysical data processorGideon. Provided emotional support to staff.   09/10/16 0700  Clinical Encounter Type  Visited With Family  Visit Type Death  Referral From Nurse  Spiritual Encounters  Spiritual Needs Sacred text;Prayer;Grief support  Stress Factors  Family Stress Factors Loss

## 2016-09-12 LAB — CULTURE, BLOOD (SINGLE): Culture: NO GROWTH

## 2016-09-13 LAB — CULTURE, BLOOD (ROUTINE X 2)
CULTURE: NO GROWTH
CULTURE: NO GROWTH

## 2016-09-15 NOTE — Progress Notes (Addendum)
PCCM PROGRESS NOTE  Admission date: 09/08/2016 Consult date: 09/04/2016 Referring provider: Dr. Myra Gianotti, [facility]  CC: altered mental status  Subjective: WBC coming down.   Vital signs: BP (!) 175/55   Pulse 70   Temp 98 F (36.7 C) (Axillary)   Resp 16   Ht 5\' 9"  (1.753 m) Comment: Simultaneous filing. User may not have seen previous data.  Wt 137 lb 2 oz (62.2 kg)   SpO2 99%   BMI 20.25 kg/m   Intake/output: I/O last 3 completed shifts: In: 6048.6 [I.V.:4428.6; NG/GT:680; IV Piggyback:940] Out: 5700 [Urine:3950; Emesis/NG output:1750]  General: calm, NAD Neuro: follows simple commands, rass zero HEENT: jvd wnl or low Cardiac: s1 s2 rrt no murmur Chest ronchi improved Abd: soft, non tender, distended mild no changes, no BS, no tenderness Ext: no edema Skin: no rashes   CMP Latest Ref Rng & Units 2016-10-04 09/08/2016 09/07/2016  Glucose 65 - 99 mg/dL 161(W) 96 960(A)  BUN 6 - 20 mg/dL 54(U) 98(J) 19(J)  Creatinine 0.61 - 1.24 mg/dL 4.78(G) 9.56(O) 1.30(Q)  Sodium 135 - 145 mmol/L 139 140 140  Potassium 3.5 - 5.1 mmol/L 3.8 4.0 4.5  Chloride 101 - 111 mmol/L 113(H) 113(H) 113(H)  CO2 22 - 32 mmol/L 22 21(L) 21(L)  Calcium 8.9 - 10.3 mg/dL 7.9(L) 7.8(L) 7.9(L)  Total Protein 6.5 - 8.1 g/dL 5.2(L) 5.1(L) -  Total Bilirubin 0.3 - 1.2 mg/dL 1.1 0.8 -  Alkaline Phos 38 - 126 U/L 88 60 -  AST 15 - 41 U/L 39 47(H) -  ALT 17 - 63 U/L 27 26 -    CBC Latest Ref Rng & Units 04-Oct-2016 09/08/2016 09/06/2016  WBC 4.0 - 10.5 K/uL 29.4(H) 37.8(H) 21.1(H)  Hemoglobin 13.0 - 17.0 g/dL 8.0(L) 7.5(L) 8.6(L)  Hematocrit 39.0 - 52.0 % 23.7(L) 23.0(L) 26.3(L)  Platelets 150 - 400 K/uL 117(L) 121(L) 175    ABG    Component Value Date/Time   PHART 7.374 October 04, 2016 0325   PCO2ART 34.8 04-Oct-2016 0325   PO2ART 87.3 10/04/2016 0325   HCO3 19.8 (L) 10/04/2016 0325   TCO2 21 09/06/2016 1217   ACIDBASEDEF 4.5 (H) 10-04-2016 0325   O2SAT 96.1 10-04-16 0325    CBG (last 3)    Recent Labs  09/08/16 2353 2016/10/04 0352 10/04/16 0719  GLUCAP 103* 167* 130*     Imaging: Ct Abdomen Pelvis Wo Contrast  Result Date: 09/08/2016 CLINICAL DATA:  75 year old male status post recent thoracic aortic aneurysm repair with stent placement on 08/20/2016 with possible postop ileus. EXAM: CT ABDOMEN AND PELVIS WITHOUT CONTRAST TECHNIQUE: Multidetector CT imaging of the abdomen and pelvis was performed following the standard protocol without IV contrast. COMPARISON:  Abdominal radiograph dated 09/06/2016 and 09/05/2016. Chest CT dated 07/05/2016 FINDINGS: Evaluation of this exam is limited in the absence of intravenous contrast. Evaluation is also limited due to streak artifact caused by patient's arms. Lower chest: Partially visualized small bilateral pleural effusions with associated partial compressive atelectasis of the lower lobes. Areas of consolidative changes in the lower lobes bilaterally as well as ground-glass and nodular densities in the right middle lobe most compatible with multifocal pneumonia. Aspiration is not excluded. Clinical correlation is recommended. There is coronary vascular calcification. There is hypoattenuation of the cardiac blood pool suggestive of a degree of anemia. Clinical correlation is recommended. Partially visualized 6 cm distal descending thoracic aortic aneurysm with endovascular stent graft repair. Evaluation of the aneurysm is limited in the absence of intravenous contrast. This however appears similar  in size compared to the prior chest CT of 07/05/2016. No intra-abdominal free air. There is diffuse mesenteric edema and small free fluid within the pelvis. Hepatobiliary: There multiple stones within the gallbladder. Evaluation of the gallbladder is limited due to diffuse mesenteric edema. Ultrasound may provide better evaluation if there is clinical concern for acute cholecystitis. The liver is grossly unremarkable. Pancreas: The pancreas is grossly  unremarkable on limited evaluation. No dilatation of the pancreatic duct or gland atrophy. Spleen: Normal in size without focal abnormality. Adrenals/Urinary Tract: The adrenal glands appear unremarkable. There is moderate left renal atrophy. Bilateral renal vascular calcifications noted. A 3 mm right renal inter pole or calcific density and a 2 mm right renal upper pole probable nonobstructing calculi. There is no hydronephrosis on either side. A Foley catheter is noted within the urinary bladder. The urinary bladder however is mildly distended despite the catheter. Stomach/Bowel: An enteric tube is partially visualized with tip in the body of the stomach. Oral contrast noted within the stomach and multiple loops of small bowel. There is thickened and edematous appearance of distal small bowel concerning for enteritis. The loops of small bowel measure up to 3.3 cm in the left hemi abdomen, likely a degree of ileus. No definite evidence of bowel obstruction. Normal appendix. Vascular/Lymphatic: There is extensive aortoiliac atherosclerotic disease as well as atherosclerotic calcification of the mesenteric vasculature, likely related to underlying diabetes. No portal venous gas identified. No definite adenopathy. Reproductive: The prostate and seminal vesicles are grossly unremarkable. Other: There is a 3.2 x 2.9 cm ovoid soft tissue density in the right hemipelvis adjacent to the bifurcation of the right iliac artery (series 2, image 56). There is a central area air density within this structure. This structure is not well evaluated on the CT. This structure appears to be retroperitoneal and therefore less likely represents a loop of bowel. A focal hematoma, an enlarged lymph node with central necrosis are possibilities. A vascular lesion such as pseudo aneurysm is less likely. A neoplastic process is not excluded. Clinical correlation is recommended. Follow-up with CT after resolution of the acute intra-abdominal  process is recommended. Bilateral inguinal surgical clips with severe atherosclerotic calcification of the vessels. Partially visualized left inguinal bypass graft. Musculoskeletal: There is osteopenia with extensive degenerative changes of the spine as well as bilateral hip osteoarthritic changes. No acute fracture. IMPRESSION: Multifocal pneumonia with small bilateral pleural effusions. Thickened appearance of the distal small bowel most compatible with enteritis. There is probable associated mild ileus without evidence of obstruction. Correlation with clinical exam and stool cultures recommended. Diffuse mesenteric and soft tissue stranding, edema and anasarca. Ovoid appearing soft tissue density with central air or cavitation along the right iliac artery bifurcation, incompletely evaluated but appears to be retroperitoneal and may represent a hematoma, an enlarged lymph node, a neoplasm, or less likely a vascular pathology suggest pseudo aneurysm. Correlation with clinical exam and follow-up with CT after resolution of acute intra-abdominal process recommended. Extensive vasculopathy. Electronically Signed   By: Elgie Collard M.D.   On: 09/08/2016 06:09   Dg Chest Port 1 View  Result Date: 08/28/2016 CLINICAL DATA:  75 year old male with respiratory failure. Subsequent encounter. EXAM: PORTABLE CHEST 1 VIEW COMPARISON:  09/08/2016 chest x-ray FINDINGS: Endotracheal tube tip 3.2 cm above the carina. Nasogastric tube courses below the diaphragm. Tip is not included on the present exam. Right central line tip in distal superior vena cava level. Worsening of patchy consolidation mid to lower lungs slightly greater on  the right. Mild central pulmonary vascular prominence. Post aortic stenting. Heart size within normal limits. IMPRESSION: Worsening patchy consolidation mid to lower lungs slightly greater on the right. Electronically Signed   By: Lacy DuverneySteven  Olson M.D.   On: 03-27-2016 07:44   Dg Chest Port 1  View  Result Date: 09/08/2016 CLINICAL DATA:  Respiratory failure EXAM: PORTABLE CHEST 1 VIEW COMPARISON:  09/07/2016 FINDINGS: Endotracheal tube, NG tube, right upper extremity PICC are stable. Airspace disease in the right mid and lower lung zone is worse. Hazy airspace disease in the left lower lung zone is worse. Opacities towards the right apex are unchanged and likely represent chronic pleural-parenchymal scarring. No pneumothorax. IMPRESSION: Worsening bilateral airspace disease. Electronically Signed   By: Jolaine ClickArthur  Hoss M.D.   On: 09/08/2016 08:43   Studies: CT head 10/20 >> atrophy, old infarcts, chronic small vessel ischemic disease EEG 10/21 >> diffuse encephalopathy  MRI brain 10/21 >> moderate atrophy, old infarcts CT abdo 10/24- ileus, no obstruction, enteritis small bowels, PNA CXR 10/26 - right effusion seems to be increasing in size.   Cultures: Blood 10/21 >> Sputum 10/23>>>abundant candida albicans  Antibiotics: Vancomycin 10/21 >>10/26 Zosyn 10/21 >>  anigulo 10/24>>>plan stop 10/29  Significant events: 10/11  Admit for endovascular stent of thoracic aortic aneurysm  10/14  Ileus, HTN 10/20  Pt pulled NGT out  10/21  Encephalopathy 10/23- asp event likely, distress, hypoxia - ETT  Summary: 75 yo male smoker admitted 10/11 for planned endovascular thoracic aortic stent.  Post op course complicated by HTN, AKI, ileus.  He developed altered mental status, progressive HTN and fever 10/21 and PCCM was asked to assist with medical management in ICU.  PMHx of Anemia, GERD with Barrett's esophagus, Hiatal hernia, Anxiety, Gout, Chronic back pain, CVA, A fib on coumadin, CAD, DM, COPD, DVT.   Assessment/plan:  ARF,. Asp event, hypoxia -pcxr slightly worse on the right today.  -even balance goals to positive On peep5 fio 40%.  Wean as possible. Consider some diuresis today? abg reviewed, keep same MV  Acute metabolic encephalopathy, neg mri, eeg delirim  -prop,  int fent -WUA mandatory, dc prop  ID Fever, asp event - resolving Leukocytosis improving.  - dc vanc -will likely narrow off zosyn in am  -consider stop date anidulo  HTN. Hx of A fib. Huston FoleyBrady on bblocker  - cardene gtt for goal SBP < 160 may need to restart and BP running in 170/s  - continue catapres patch to avoid reboun  S/p endovascular stent to thoracic aorta. - post-op care per VVS -abdo soft, ileus remains, occasional flatus no BM.  -NGT to suction should remain -reglan continued , get  ecg for qtc  Ileus >> remains.  Dysphagia. Nutrition. - continue reglan - TPN continued -enema, reglan -follow output from ngt, it is reduced  Anemia of critical illness. - hgb trending down to 7.5., lasix, as this is dilution also sub q hep  DM type II. - SSI -reglan  COPD. - brovana, pulmicort and prn duoneb  ARF,ATN, hypovolemia? hyperChloremia (TPN also a contribute) - improving.  Could consider some lasix today?  Tasrif Ahmed, PGY-3.   STAFF NOTE: I, Rory Percyaniel Selenia Mihok, MD FACP have personally reviewed patient's available data, including medical history, events of note, physical examination and test results as part of my evaluation. I have discussed with resident/NP and other care providers such as pharmacist, RN and RRT. In addition, I personally evaluated patient and elicited key findings ZO:XWRUEAVof:abdomen is clearly softern and  has faint BS indicating improving ileus,  awakens, follows commands, rass -1, less coasre BS, clear anterior, pcxr with edema vs residual ALI, wean aggressive cpap 5 ps5, goal 1 hr  Assess rsbi, WBC is down, renal fxn improved, add lasix for cocner edema and continued pos balance, need to control BP better, re add nicardipine, I do not feel that tha candida is a pathogen, will add stop date 5 days for antifungal, dc vanc, likley to narrow zosyn in am, follow crt in am, upright, WUA , dc sedation, may be a candidate for extubation, ileus and abdominal  concerns ischemia are improving` The patient is critically ill with multiple organ systems failure and requires high complexity decision making for assessment and support, frequent evaluation and titration of therapies, application of advanced monitoring technologies and extensive interpretation of multiple databases.   Critical Care Time devoted to patient care services described in this note is 35 Minutes. This time reflects time of care of this signee: Rory Percy, MD FACP. This critical care time does not reflect procedure time, or teaching time or supervisory time of PA/NP/Med student/Med Resident etc but could involve care discussion time. Rest per NP/medical resident whose note is outlined above and that I agree with   Mcarthur Rossetti. Tyson Alias, MD, FACP Pgr: [phone] [facility] 09/03/2016 8:41 AM

## 2016-09-15 NOTE — Progress Notes (Addendum)
   Daily Progress Note  CTSP pt due to cardiac arrest.  Reported patient acutely dropped his HR in 30s and lost his pulse.  After 30 minutes of CPR reported the patient regained a pulse.  On exam currently, patient does not have palpable peripheral pulses.  SBP in 30s on wide open fluids.  Pupils are fixed.  - I have updated the family including wife  - They have elected to limited therapy including no further CPR and no further escalation of therapy  Leonides SakeBrian Berkley Wrightsman, MD, FACS [facility] [place] Office: [phone] Pager: [phone]  08/16/2016, 9:06 PM  Addendum  Pt died at 2121.  Cause of death: cardiac failure.  Leonides SakeBrian Tynlee Bayle, MD, FACS [facility] [place] Office: [phone] Pager: [phone]  08/26/2016, 9:21 PM

## 2016-09-15 NOTE — Progress Notes (Addendum)
PARENTERAL NUTRITION CONSULT NOTE - FOLLOW UP  Pharmacy Consult for TPN Indication: Prolonged ileus  Allergies  Allergen Reactions  . Lopressor [Metoprolol Tartrate] Palpitations    LOPRESSOR bothers the patient.  . Niacin Swelling    FACIAL EDEMA    Patient Measurements: Height: 5\' 9"  (175.3 cm) (Simultaneous filing. User may not have seen previous data.) Weight: 137 lb 2 oz (62.2 kg) IBW/kg (Calculated) : 70.7   Vital Signs: Temp: 98 F (36.7 C) (10/26 0724) Temp Source: Axillary (10/26 0724) BP: 175/55 (10/26 4696) Pulse Rate: 70 (10/26 0700) Intake/Output from previous day: 10/25 0701 - 10/26 0700 In: 3562.2 [I.V.:2962.2; NG/GT:120; IV Piggyback:480] Out: 4315 [Urine:3065; Emesis/NG output:1250] Intake/Output from this shift: No intake/output data recorded.  Labs:  Recent Labs  09/08/16 0419 09/05/2016 0400  WBC 37.8* 29.4*  HGB 7.5* 8.0*  HCT 23.0* 23.7*  PLT 121* 117*  APTT 44*  --   INR 1.21  --      Recent Labs  09/06/16 1114 09/07/16 0510 09/08/16 0419 08/24/2016 0400  NA  --  140 140 139  K  --  4.5 4.0 3.8  CL  --  113* 113* 113*  CO2  --  21* 21* 22  GLUCOSE  --  169* 96 160*  BUN  --  39* 40* 37*  CREATININE  --  1.75* 1.47* 1.27*  CALCIUM  --  7.9* 7.8* 7.9*  MG  --  2.1 1.8 1.9  PHOS  --  5.4* 4.1 3.7  PROT  --   --  5.1* 5.2*  ALBUMIN  --   --  1.5* 1.4*  AST  --   --  47* 39  ALT  --   --  26 27  ALKPHOS  --   --  60 88  BILITOT  --   --  0.8 1.1  TRIG 374*  --   --   --    Estimated Creatinine Clearance: 44.2 mL/min (by C-G formula based on SCr of 1.27 mg/dL (H)).    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 The patient is noted to have an ileus post-op and pharmacy has been consulted to start  TPN for nutrition. TPN started 10/21.   GI: Post-op ileus had resolved but pt failed swallow eval 10/22, then intubated 10/23 for probable aspiration. LBM 10/21. NGO 1450>>300; onscheduled Reglan IV, Pepcid 20 mg in TPN;  10/24 CT abd/pelvis: d/w enteritis, mild ileus w/ no evidence of obstruction Endo:Hx DM. CBGs controlled with insulin 35 units/bag in TPN,  5 units SSI.    Lytes: lytes taken out of TPN 10/24 and added back 10/25: K 3.8,  Mg 1.9; Phos 3.7. CoCa 9.98, Caxphos = 37, goal < 55. Cl remains elevated at 113 Renal:SCr 1.1>1.25>>1.75>1.47>1.27, UOP 3065 ml, improved; has IVF at 50 ml/hr Pulm: intubated 10/23 due to probable aspiration Cards: Hx Afib- full dose LMWH changed to VTE px sq hep 10/24,  Amlodipine, Clonidine TTS patch, Hydralazine IV scheduled and prn,  Cozaar dose increased 10/26,  Propafenone; IV cardene stopped 10/23 prior to intubation w/ propofol sedation Hepatobil:LFT WNL,  Tbili wnl. TG 190>374 with start of propofol 10/23, repeat trig ordered for 10/26 at 10am, Palb 5.6- inflammatory state/post-op  Neuro: propofol at 7.2  ml/hr  Transferred to ICU 10/21 2nd AMS, neuro consult.  EEG- diffuse encephalopathy, no sz activity.  MRI- mod atrophy & old infarcts ZO:XWRUEAVW:Afebrile, WBC 21.1>37.8>29.4  eraxis,vanc/zosyn;  aspiration pna CT 10/24 multifocal PNA  Vanc/Zosyn 10/21>> anidulafungin 10/24>> Best Practices:sq hep TPN Access:10/18 TPN start date:10/19 >>  Nutritional Goals (per RD recommendation on 10/23): UJWJ:[id number]:[id number] Protein: 80-95g  Goal Rate:  Clinimix E 5/20at 5875ml/hr and 20% lipids at 10 ml/hr on 4 days a week, TTSS providing a weekly average of 1858 kcal and 90g protein, meeting 100% caloric goals and 100% protein goals Propofol at 7.2 ml/hr providing ~190 kcal/day  Current Nutrition: NPO, TPN, propofol  Plan: Continue Clinimix E 5/20 at 4975ml/hr  no lipids again today 2nd to propofol at 7.2 ml/hr providing ~ 190 kcals and trig up  to 374 on 10/23 - Trig to be drawn again 10/26 at 10am per propofol protocol TPN will provide 1584 kcals and 90 gm protein  TPN + propofol provides 1774 kcal and 90 gm protein continue Trace elements only to TPN, no MVI d/t facial swelling with Niacin continue insulin in TPN 35 units Increase pepcid to 40 mg for improved renal fxn  Continue Moderate SSI 1 gm mag and 2 runs KCL BMET and mag in AM Consider trickle tube feeds  Herby AbrahamMichelle T. Kadra Kohan, Pharm.D. [phone] 09/12/2016 8:33 AM

## 2016-09-15 NOTE — Discharge Summary (Signed)
[facility] Death Summary  [name] 1941-03-16 75 y.o. 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 male who Dr. Myra Gianotti has been evaluating with Dr. Tyrone Sage for thoracic stent graft to treat a descending thoracic aortic aneurysm. He has a history of a left femoral to posterior tibial bypass graft with arm vein by Dr. Hart Rochester. After Dr. Myra Gianotti saw him in the office, he presented to the hospital with an occluded bypass graft. He underwent successful thrombolysis.  The patient is currently undergoing a pulmonary workup for clearance for his surgery. Currently, he is scheduled to get pulmonary function studies today. He had a repeat CT scan which shows the pulmonary process is decreasing, suggesting this is inflammatory versus infectious rather than neoplastic. On his CT scan, the aneurysm has increased in size from 5.5-5.7.  Hospital Course:  The patient was admitted to the hospital and taken to the operating room on September 02, 2016 and underwent:  #1: Retroperitoneal exposure of the right common, external, and internal iliac artery #2: Catheter in aorta 2 #3: Thoracic aortic angiogram #4: Stent, descending thoracic aorta  The patient tolerated the procedure well and was extubated in the OR. He was transported to the PACU in stable condition.   The evening of surgery, he was stable in the ICU with intact neuro exam. His incision was clean and intact and left leg graft with palpable pulse.   On POD 1, the patient complained of some left lower quadrant abdominal pain. His abdomen was soft. He was unsure whether or not he was passing flatus. He said his abdominal pain improved after belching. He was kept nothing by mouth except for sips. He had ABLA with HCT of 23. This was monitored. He was continued on  levaquin given preoperative chest x-ray findings of left lower lobe possible atelectasis versus early pneumonia. He had hypertension requiring a nitroglycerin drip. His goal systolic blood pressure was less than 130. The patient had chronic renal is insufficiency stage III. His creatinine was slightly elevated at 1.5 after intraoperative contrast. He was continued with gentle hydration. He was kept in the ICU.  POD 2 (08/26/16): He was not passing flatus. He was kept nothing by mouth except for sips and chips given no flatus.  His abdomen was slightly more distended. He had palpable femoral pulses. He was restarted on full dose Lovenox for atrial fibrillation. His blood pressure remained elevated and was continued on a nitroglycerin drip. His clonidine and hydralazine were increased. Goal systolic blood pressure less than 150. He was transfused 1 unit of packed red blood cells for acute blood loss anemia. He was transferred out of the ICU to a stepdown unit.  The patient was passing flatus on postop day 3. He was started on a soft diet. He did continue to have some left lower quadrant tenderness. His H&H was improved after blood transfusion. His creatinine continued to improve. His leukocytosis was slightly improved.  An abdominal x-ray was checked on postop day 4. His abdomen was moderately distended. This showed significant ileus.  He was continued on full dose Lovenox. Patient refused NG tube placement.  Interventional radiology was consulted to place an NG tube on 08/30/2016. His Lovenox was held. He denied any flatus. He still required a nitroglycerin drip for hypertension.  His leukocytosis continued to improve on 08/31/16.  He started passing flatus on 09/01/16. He continued to have high NG output. His ileus was unchanged on abdominal x-ray. A PICC line was placed to start TNA.   His abdomen was less distended on 09/02/16. His NG tube was kept in. He was started on TNA. His Hgb was still low  from surgical blood loss anemia and he was transfused one unit of PRBCs.   He became agitated and confused on 09/03/16 and pulled his NG tube out. He still had issues with blood pressure control still requiring nitro gtt. Clonidine patch was added. He was started on ice chips.   The family was concerned about the patient's change in mental status (09/03/16) as he had been somnolent most of the day and having difficulty speaking and speech slurring. . On exam he was alert and oriented with diminished speech but no slurring of speech. He was unable to raise his right arm when asked. A CT head was performed showing no acute event. His UA was clean. It was felt that he had post-operative delirium. His WBC also increased. There was concerns for early pneumonia. His xray only showed pleural effusions. He was started on vanc and zosyn for suspected pneumonia and increased WBC.   The patient was unarousable on the 09/04/16 and rapid response was called. His speech was somewhat garbled. He appeared confused and deeply sedated. Neurology was consulted. The patient was also diuresed for hypervolemia. He continued to be hypertensive. His ABG showed normal PCO2. His WBC remained elevated. He was transferred to the ICU. CCM was asked to assist.   The patient was feeling better on 09/05/16. He continued to have some garbled speech but no other focal deficits. He had slight increase in colonic gas. His CXR showed bibasilar infiltrates. He was continued on TPN.   The patient required increased 02 on 09/06/16. The had two emesis events overnight. It was felt that he had aspirated and was intubated that morning. His CXR showed persistent pleural effusion. He was given to doses of IV lasix. His sputum cx was pending. He was continued on IV abx for possible pneumonia. His WBC was increasing.   The patient had worsening renal function on 09/07/16. His lasix was held. His fluids were reduced. He was started on an antifungal  given yeast in sputum culture. He was continued on the vent.   His WBC worsened to 37k on 09/08/16. His CT abdomen showed enteritis but no evidence of perforation or obstruction. His CXR showed pneumonia and effusions. He was continued on antibiotics and antifungals.   On POD 15 (09/01/2016), the patient denied any abdominal pain or nausea. He was passing flatus. His leukocytosis was improved to 21k, previously 29.4  earlier that morning.  He was continued on antibiotics. His abdomen was soft and non tender.His renal function was improving. He was continued on TPN. The patient was extubated at 1428 that day to 4L [place]. With decreased 02 saturation, his oxygen was increased to 6L.    Later that evening, Dr. Imogene Burnhen was called to see the patient due to cardiac arrest. Per his note, "Reported patient acutely dropped his HR in 30s and lost his pulse.  After 30 minutes of CPR reported the patient regained a pulse.  On exam currently, patient does not have palpable peripheral pulses.  SBP in 30s on wide open fluids.  Pupils are fixed." Dr. Imogene Burnhen updated the family and the family elected to limited therapy including no further CPR or escalation of therapy. The patient  died at 2121, cause of death: cardiac failure.   CBC    Component Value Date/Time   WBC 21.4 (H) 10/05/2016 2025   RBC 2.05 (L) 10/05/16 2025   HGB 6.5 (LL) 2016-10-05 2025   HCT 19.7 (L) 2016-10-05 2025   PLT 101 (L) 2016/10/05 2025   MCV 96.1 10/05/2016 2025   MCH 31.7 05-Oct-2016 2025   MCHC 33.0 10/05/16 2025   RDW 17.6 (H) 2016-10-05 2025   LYMPHSABS 1.5 10/05/16 0400   MONOABS 1.5 (H) October 05, 2016 0400   EOSABS 0.9 (H) 10/05/16 0400   BASOSABS 0.0 10/05/16 0400    BMET    Component Value Date/Time   NA 128 (L) Oct 05, 2016 2025   K 3.3 (L) 10-05-2016 2025   CL 104 10/05/2016 2025   CO2 19 (L) 10/05/2016 2025   GLUCOSE 189 (H) 2016-10-05 2025   BUN 35 (H) 10/05/16 2025   CREATININE 1.17 10-05-16 2025   CALCIUM 6.8 (L)  10/05/16 2025   GFRNONAA 59 (L) 10-05-2016 2025   GFRAA >60 10-05-2016 2025     Discharge Instructions:   The patient is discharged to home with extensive instructions on wound care and progressive ambulation.  They are instructed not to drive or perform any heavy lifting until returning to see the physician in his office.    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 Descending thoracic aortic aneurysm (HCC)   . Cough   . Pressure injury of skin 09/07/2016  . Abdominal distension   . Ileus (HCC)   . Aspiration into airway   . Somnolence   . Acute encephalopathy   . Protein-calorie malnutrition, severe 08/26/2016  . S/P thoracic aortic aneurysm repair 08/26/2016  . Thoracic aortic aneurysm without rupture (HCC) 08/21/2016  . Malnutrition of moderate degree 06/15/2016  . PAD (peripheral artery disease) (HCC) 06/12/2016  . CVA (cerebral vascular accident) February 2016 01/03/2015  . Essential hypertension 01/03/2015  . Weight loss 08/08/2014  . Encounter for central line placement 03/19/2014  . DM2 (diabetes mellitus, type 2) (HCC) 12/11/2013  . COPD (chronic obstructive pulmonary disease) (HCC) 12/11/2013  . PVD (peripheral vascular disease) (HCC) 12/11/2013  . Barrett's esophagus 12/11/2013  . Occlusion and stenosis of carotid artery without mention of cerebral infarction 05/21/2013  . Anticoagulated on Coumadin 05/03/2013  . Other and unspecified hyperlipidemia 05/03/2013  . Paroxysmal atrial fibrillation (HCC) 05/03/2013  . Benign hypertensive heart disease without heart failure 05/03/2013  . 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 AAA (abdominal aortic aneurysm) (HCC)   . Anemia   . Anxiety   . Arthritis    Gout  . Atrial fibrillation (HCC)   . Barrett esophagus   . Carotid  artery occlusion    Carotid endarterectomy 2002 and 2004  . COPD (chronic obstructive pulmonary disease) (HCC)   . Diabetes mellitus   . Diverticulosis   . DVT (deep venous thrombosis) (HCC)   . Erectile dysfunction   . GERD (gastroesophageal reflux disease)   . Gout   . Hiatal hernia   . History of stress test    March 2012 post stress ejection fraction 56%, negative for ischemia.  Last 2-D echocardiogram March 2012 younger than 55% mild concentric left ventricular hypertrophy grade 1 diastolic dysfunction. Mildly dilated left atrium. 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  . Hyperlipidemia   .  Hypertension   . Kidney stone   . Lumbar stenosis   . PAD (peripheral artery disease) (HCC)    Fem-Fem by-pass graft 1991. redo patch angioplasty in 2000 and 2004.  LEA dopplers: 04/2012- R-ABI 1.34, L-ABI 0.97, patent Left lower extremity graft.  . Pancreatitis   . Stroke [facility]) March 2016   Lihgt Stroke       Medication List    ASK your doctor about these medications   ALPRAZolam 0.5 MG tablet Commonly known as:  XANAX Take 0.25-0.5 mg by mouth at bedtime as needed for anxiety.   amLODipine 5 MG tablet Commonly known as:  NORVASC Take 5 mg by mouth daily with supper.   aspirin EC 81 MG tablet Take 81 mg by mouth every evening.   atorvastatin 80 MG tablet Commonly known as:  LIPITOR Take 40 mg by mouth every evening.   cilostazol 100 MG tablet Commonly known as:  PLETAL Take 100 mg by mouth 2 (two) times daily at 8 am and 10 pm.   cloNIDine 0.3 MG tablet Commonly known as:  CATAPRES Take 0.1 mg by mouth 3 (three) times daily.   COUMADIN PO Take 4-6 mg by mouth as directed. 4 mg daily with the exception of 6 mg on Fridays   docusate sodium 100 MG capsule Commonly known as:  COLACE Take 200 mg by mouth 2 (two) times daily as needed for mild constipation.   enoxaparin 100 MG/ML injection Commonly known as:  LOVENOX Inject 100 mg into the skin daily at 6 PM.   FERROCITE F  PO Take 1 capsule by mouth daily before lunch.   FLUTTER Devi Use as directed.   glipiZIDE 5 MG tablet Commonly known as:  GLUCOTROL Take 5 mg by mouth 2 (two) times daily before a meal.   levofloxacin 750 MG tablet Commonly known as:  LEVAQUIN Take 1 tablet (750 mg total) by mouth daily.   losartan 100 MG tablet Commonly known as:  COZAAR Take 100 mg by mouth daily with lunch.   LOVAZA 1 g capsule Generic drug:  omega-3 acid ethyl esters Take 2 g by mouth 2 (two) times daily.   metoprolol tartrate 25 MG tablet Commonly known as:  LOPRESSOR TAKE 1 TABLET BY MOUTH TWICE DAILY   mometasone-formoterol 200-5 MCG/ACT Aero Commonly known as:  DULERA Inhale 2 puffs into the lungs 2 (two) times daily.   Oxycodone HCl 20 MG Tabs Take 1 tablet (20 mg total) by mouth every 8 (eight) hours as needed for moderate pain.   pantoprazole 40 MG tablet Commonly known as:  PROTONIX Take 40 mg by mouth 2 (two) times daily.   propafenone 225 MG tablet Commonly known as:  RYTHMOL Take 225 mg by mouth 2 (two) times daily.   saxagliptin HCl 2.5 MG Tabs tablet Commonly known as:  ONGLYZA Take 2.5 mg by mouth daily.   tamsulosin 0.4 MG Caps capsule Commonly known as:  FLOMAX Take 0.4 mg by mouth daily as needed (for urinary difficulty).   testosterone cypionate 200 MG/ML injection Commonly known as:  DEPOTESTOSTERONE CYPIONATE Inject 100 mg into the skin every 14 (fourteen) days.   Vitamin D (Ergocalciferol) 50000 units Caps capsule Commonly known as:  DRISDOL Take 50,000 Units by mouth every 7 (seven) days. Wednesday       Patient's condition: is deceased  Maris Berger, [place] [facility] [phone] 09/10/2016  7:50 AM   - For VQI Registry use --- Instructions: Press F2 to tab through  selections.  Delete question if not applicable.   Post-op:  Time to Extubation: [x ] In OR, [ ]  < 12 hrs, [ ]  12-24 hrs, [ ]  >=24 hrs Vasopressors Req. Post-op:  No MI: No., [ ]  Troponin only, [ ]  EKG or Clinical New Arrhythmia: No CHF: Yes ICU Stay: 8 days Transfusion: Yes  If yes, 1 unit given  Complications: Resp failure: Yes.  , [x ] Pneumonia, [ x] Ventilator Chg in renal function: Yes.  , [x ] Inc. Cr > 0.5, [ ]  Temp. Dialysis, [ ]  Permanent dialysis Leg ischemia: No., no Surgery needed, [ ]  Yes, Surgery needed, [ ]  Amputation Bowel ischemia: No., [ ]  Medical Rx, [ ]  Surgical Rx Wound complication: No., [ ]  Superficial separation/infection, [ ]  Return to OR Return to OR: No  Return to OR for bleeding: No Stroke: No., [ ]  Minor, [ ]  Major

## 2016-09-15 NOTE — Progress Notes (Addendum)
      [address]       [place] [id number]             [phone]      15 Days Post-Op Procedure(s) (LRB): THORACIC AORTIC ENDOVASCULAR STENT GRAFT WITH RETROPERITONEAL EXPOSURE (N/A)   Subjective:  Remains on vent... Awake responds to commands, having pain this morning  Objective: Vital signs in last 24 hours: Temp:  [98 F (36.7 C)-99.9 F (37.7 C)] 98 F (36.7 C) (10/26 0724) Pulse Rate:  [69-102] 70 (10/26 0700) Cardiac Rhythm: Normal sinus rhythm (10/26 0000) Resp:  [16-26] 16 (10/26 0700) BP: (139-190)/(42-57) 175/55 (10/26 0608) SpO2:  [95 %-100 %] 99 % (10/26 0700) FiO2 (%):  [40 %] 40 % (10/26 0355) Weight:  [137 lb 2 oz (62.2 kg)] 137 lb 2 oz (62.2 kg) (10/26 0600)  Intake/Output from previous day: 10/25 0701 - 10/26 0700 In: 3562.2 [I.V.:2962.2; NG/GT:120; IV Piggyback:480] Out: 4315 [Urine:3065; Emesis/NG output:1250]  General appearance: alert, cooperative and no distress Heart: regular rate and rhythm Lungs: rhonchi mild Abdomen: soft, non-tender; bowel sounds normal; no masses,  no organomegaly Wound: clean and dry  Lab Results:  Recent Labs  09/08/16 0419 2016-07-07 0400  WBC 37.8* 29.4*  HGB 7.5* 8.0*  HCT 23.0* 23.7*  PLT 121* 117*   BMET:  Recent Labs  09/08/16 0419 2016-07-07 0400  NA 140 139  K 4.0 3.8  CL 113* 113*  CO2 21* 22  GLUCOSE 96 160*  BUN 40* 37*  CREATININE 1.47* 1.27*  CALCIUM 7.8* 7.9*    PT/INR:  Recent Labs  09/08/16 0419  LABPROT 15.3*  INR 1.21   ABG    Component Value Date/Time   PHART 7.374 03-24-16 0325   HCO3 19.8 (L) 03-24-16 0325   TCO2 21 09/06/2016 1217   ACIDBASEDEF 4.5 (H) 03-24-16 0325   O2SAT 96.1 03-24-16 0325   CBG (last 3)   Recent Labs  09/08/16 2353 2016-07-07 0352 2016-07-07 0719  GLUCAP 103* 167* 130*    Assessment/Plan: S/P Procedure(s) (LRB): THORACIC AORTIC ENDOVASCULAR STENT GRAFT WITH RETROPERITONEAL EXPOSURE (N/A)  1. Pulm- remains on vent,  awake responds to commands- care per CCM 2. ID- leukocytosis improved, febrile at times, repeat cultures have been negative so far.. UC remains pending... On Vanc/Zosyn for aspiration pneumonia and Eraxis for fungal coverage 3. CV- remains HTN, brady at times so IV labetaolol discontinued- will continue prn Hydralazine, norvasc, increase Cozaar to 50 mg 4. GI- continue TPN 5. Dispo- remains on vent weaning per CCM, continue broad spectrum ABX coverage, increased Cozaar for BP control, care per primary   LOS: 15 days    [name], [name] 03-24-16

## 2016-09-15 NOTE — Progress Notes (Signed)
Pt ordered to be extubated. RT and RN at bedside preparing to extubate. Sats dropped to 88-90%. RN called MD to assess patient. MD ordered to increase to 50% and place back on rest mode for 1 hour then wean again. Will cont to monitor

## 2016-09-15 NOTE — Procedures (Signed)
Extubation Procedure Note  Patient Details:   Name: [name] DOB: 04-10-1941 MRN: [id number]   Airway Documentation:     Evaluation  O2 sats: stable throughout Complications: No apparent complications Patient did tolerate procedure well. BBS Rhonci     Pt  Extubated at this time per MD order, pt had +cuff leak prior to extubation.  Placed on 4L [place], spo2 decreased, inc to 6L, spo2 now 92%   Cherylin MylarDoyle, Kerri Asche 08/31/2016, 2:28 PM

## 2016-09-15 NOTE — Progress Notes (Signed)
CDS referral at 2155

## 2016-09-15 NOTE — Progress Notes (Signed)
Time of death: 2120 - confirmed by two RN's

## 2016-09-15 NOTE — Progress Notes (Signed)
[facility] Progress Note  Subjective  - POD #15  Awake on vent. Denies any abdominal pain and nausea.   Objective Vitals:   09/02/2016 1000 09/04/2016 1046  BP: (!) 171/47 (!) 117/38  Pulse: 98 61  Resp: 19 (!) 21  Temp:      Intake/Output Summary (Last 24 hours) at 09/04/2016 1114 Last data filed at 08/21/2016 1049  Gross per 24 hour  Intake          3717.61 ml  Output             3875 ml  Net          -157.39 ml   Awake on vent. Nods and shakes head to questions Abdomen soft, non tender. Abdominal incisions clean. Feet warm bilaterally.   Assessment/Planning: 75 y.o. male is s/p:  Thoracic aortic endovascular stent graft repair.  15 Days Post-Op   Pulm: plans to extubate today. CV: Hypertension on cardene gtt, IV labetalol discontinued for bradycardia, catepres patch GI: ileus improved, passing flatus, continue TPN. Do not need to replace NG once extubated.  Renal: Creatinine improving.Good UOP. Diuresis per CCM.  Heme: Hgb stable.  ID: Leukocytosis improving. Likely from aspiration event. On vanc/zosyn and eraxis. No signs of abdominal ischemia.    Raymond Gurney 08/16/2016 11:14 AM --  Laboratory CBC    Component Value Date/Time   WBC 29.4 (H) 09/08/2016 0400   HGB 8.0 (L) 09/06/2016 0400   HCT 23.7 (L) 08/28/2016 0400   PLT 117 (L) 08/19/2016 0400    BMET    Component Value Date/Time   NA 139 08/27/2016 0400   K 3.8 09/02/2016 0400   CL 113 (H) 09/02/2016 0400   CO2 22 09/10/2016 0400   GLUCOSE 160 (H) 09/06/2016 0400   BUN 37 (H) 08/23/2016 0400   CREATININE 1.27 (H) 08/23/2016 0400   CALCIUM 7.9 (L) 09/12/2016 0400   GFRNONAA 54 (L) 08/15/2016 0400   GFRAA >60 08/19/2016 0400    COAG Lab Results  Component Value Date   INR 1.21 09/08/2016   INR 1.27 09/01/2016   INR 1.28 08/31/2016   No results found for: PTT  Antibiotics Anti-infectives    Start     Dose/Rate Route Frequency Ordered Stop   09/08/16 2200   anidulafungin (ERAXIS) 100 mg in sodium chloride 0.9 % 100 mL IVPB     100 mg over 90 Minutes Intravenous Every 24 hours 09/07/16 2141 09/12/16 2359   09/07/16 2200  fluconazole (DIFLUCAN) IVPB 400 mg  Status:  Discontinued     400 mg 100 mL/hr over 120 Minutes Intravenous Every 24 hours 09/07/16 2128 09/07/16 2136   09/07/16 2200  anidulafungin (ERAXIS) 200 mg in sodium chloride 0.9 % 200 mL IVPB     200 mg over 180 Minutes Intravenous  Once 09/07/16 2141 09/08/16 0159   09/07/16 1400  vancomycin (VANCOCIN) 500 mg in sodium chloride 0.9 % 100 mL IVPB  Status:  Discontinued     500 mg 100 mL/hr over 60 Minutes Intravenous Every 12 hours 09/07/16 1347 08/31/2016 0838   09/04/16 1300  piperacillin-tazobactam (ZOSYN) IVPB 3.375 g     3.375 g 12.5 mL/hr over 240 Minutes Intravenous Every 8 hours 09/04/16 1249     09/04/16 1247  vancomycin (VANCOCIN) 500 mg in sodium chloride 0.9 % 100 mL IVPB  Status:  Discontinued     500 mg 100 mL/hr over 60 Minutes Intravenous Every 12 hours 09/04/16 1248 09/07/16 1004  09/04/16 0800  piperacillin-tazobactam (ZOSYN) IVPB 3.375 g  Status:  Discontinued     3.375 g 12.5 mL/hr over 240 Minutes Intravenous Every 8 hours 09/03/16 2355 09/04/16 1249   09/04/16 0000  vancomycin (VANCOCIN) 500 mg in sodium chloride 0.9 % 100 mL IVPB  Status:  Discontinued     500 mg 100 mL/hr over 60 Minutes Intravenous Every 12 hours 09/03/16 2355 09/04/16 1248   09/04/16 0000  piperacillin-tazobactam (ZOSYN) IVPB 3.375 g     3.375 g 100 mL/hr over 30 Minutes Intravenous STAT 09/03/16 2355 09/04/16 0142   08/26/16 1000  levofloxacin (LEVAQUIN) tablet 750 mg  Status:  Discontinued     750 mg Oral Daily 09/05/2016 1624 08/26/16 0809   08/22/2016 1630  cefUROXime (ZINACEF) 1.5 g in dextrose 5 % 50 mL IVPB     1.5 g 100 mL/hr over 30 Minutes Intravenous Every 12 hours 09/13/2016 1624 08/26/16 0430   09/08/2016 0705  dextrose 5 % with cefUROXime (ZINACEF) ADS Med    Comments:  Scronce,  Trina   : cabinet override      09/14/2016 0705 09/14/2016 0842   08/16/2016 0604  cefUROXime (ZINACEF) 1.5 g in dextrose 5 % 50 mL IVPB     1.5 g 100 mL/hr over 30 Minutes Intravenous 30 min pre-op 08/16/2016 0604 08/21/2016 0842       Maris BergerKimberly Trinh, PA-C [facility] Office: [phone] Pager: [phone] 02-26-16 11:14 AM  Wife updated at bedside Looks better today Sedation off, and he is responding Renal function improving WBC decreased Continue to monitor abd exam, as enteritis seen on CT.  Do not think he needs abdominal exploration at this time as he is improving Continue abx  [name]

## 2016-09-15 NOTE — Progress Notes (Addendum)
      [address]       [place] [id number]             [phone]               14 Days Post-Op Procedure(s) (LRB): THORACIC AORTIC ENDOVASCULAR STENT GRAFT WITH RETROPERITONEAL EXPOSURE (N/A)   Subjective:   Remains on ventilator.  Awake and responds to commands.  He denies pain.  Objective: Vital signs in last 24 hours: Temp:  [98.3 F (36.8 C)-99.6 F (37.6 C)] 99.6 F (37.6 C) (10/25 0904) Pulse Rate:  [65-115] 96 (10/25 1000) Cardiac Rhythm: Normal sinus rhythm;Atrial fibrillation (10/25 0745) Resp:  [18-26] 24 (10/25 1000) BP: (113-175)/(44-67) 172/56 (10/25 1000) SpO2:  [93 %-100 %] 96 % (10/25 1000) FiO2 (%):  [40 %] 40 % (10/25 0803) Weight:  [139 lb 5.3 oz (63.2 kg)] 139 lb 5.3 oz (63.2 kg) (10/25 0500)  Intake/Output from previous day: 10/24 0701 - 10/25 0700 In: 4022.8 [I.V.:2802.8; NG/GT:560; IV Piggyback:660] Out: 2730 [Urine:1930; Emesis/NG output:800] Intake/Output this shift: Total I/O In: 385.8 [I.V.:355.8; NG/GT:30] Out: 940 [Urine:740; Emesis/NG output:200]  General appearance: alert, cooperative and no distress Heart: regular rate and rhythm Lungs: mild rhonchi bilaterally Abdomen: soft, non-tender; bowel sounds normal; no masses,  no organomegaly Wound: clean and dry  Lab Results:  Recent Labs (last 2 labs)    Recent Labs  09/06/16 0310 09/08/16 0419  WBC 21.1* 37.8*  HGB 8.6* 7.5*  HCT 26.3* 23.0*  PLT 175 121*     BMET:  Recent Labs (last 2 labs)    Recent Labs  09/07/16 0510 09/08/16 0419  NA 140 140  K 4.5 4.0  CL 113* 113*  CO2 21* 21*  GLUCOSE 169* 96  BUN 39* 40*  CREATININE 1.75* 1.47*  CALCIUM 7.9* 7.8*      PT/INR:  Recent Labs (last 2 labs)    Recent Labs  09/08/16 0419  LABPROT 15.3*  INR 1.21     ABG Labs (Brief)          Component Value Date/Time   PHART 7.338 (L) 09/06/2016 1217   HCO3 19.6 (L) 09/06/2016 1217   TCO2 21 09/06/2016 1217   ACIDBASEDEF  5.0 (H) 09/06/2016 1217   O2SAT 100.0 09/06/2016 1217     CBG (last 3)   Recent Labs (last 2 labs)    Recent Labs  09/07/16 2349 09/08/16 0402 09/08/16 0901  GLUCAP 123* 97 75      Assessment/Plan: S/P Procedure(s) (LRB): THORACIC AORTIC ENDOVASCULAR STENT GRAFT WITH RETROPERITONEAL EXPOSURE (N/A)  1. Pulm- on vent after suspected aspiration yesterday--- awake responds to commands, weaning per CCM... CXR with worsening air space disease 2. ID- leukocytosis continues to rise, up to 37K, UA is negative from this morning... On Vanc/Zosyn, and Eraxis for funal coverage 3. CV- Bradycardic at times, Hypertensive this morning..continue Hydralazine, Cozaar, Catapres, Norvasc 4. GI- continue TPN 5. Dispo- re-intubated for suspected aspiration yesterday, continue vent wean per CCM, on broad spectrum ABX, continue BP control, continue current care   LOS: 14 days    [name], [name] 09/08/2016  Neuro ok, remaons on vent per ccm I have seen and examined [name] BallerArchie L [name] and agree with the above assessment  and plan.  Delight OvensEdward B Ryker Pherigo MD [facility] [phone] Office [phone] 09/08/2016 8:34 PM

## 2016-09-15 NOTE — Progress Notes (Signed)
Pt NTS due to low sats and unable to cough up secretions. Pt tol well. Moderate amount of thick yellow secretions suctioned out. VS stable, sats recovered to 94% on 55% VM. Will cont to monitor

## 2016-09-15 DEATH — deceased

## 2016-09-20 ENCOUNTER — Ambulatory Visit: Payer: Medicare Other | Admitting: Pulmonary Disease

## 2016-10-22 ENCOUNTER — Ambulatory Visit: Payer: Medicare Other | Admitting: Cardiovascular Disease

## 2016-10-25 ENCOUNTER — Encounter (HOSPITAL_COMMUNITY): Payer: Medicare Other

## 2016-10-25 ENCOUNTER — Other Ambulatory Visit (HOSPITAL_COMMUNITY): Payer: Medicare Other

## 2016-10-25 ENCOUNTER — Ambulatory Visit: Payer: Medicare Other | Admitting: Surgery

## 2016-10-25 ENCOUNTER — Ambulatory Visit: Payer: Medicare Other | Admitting: Pulmonary Disease

## 2016-12-10 IMAGING — CR DG CHEST 2V
2 series · 2 of 2 positions shown · non-contrast
Comparison: CT scan of the chest July 05, 2016 and chest x-ray
April 15, 2016

CLINICAL DATA: Preoperative examination. Patient reports productive
cough upon arising in the mornings. No fever. History of
hypertension and diabetes and peripheral vascular disease.

EXAM:
CHEST  2 VIEW

[w chest pa]
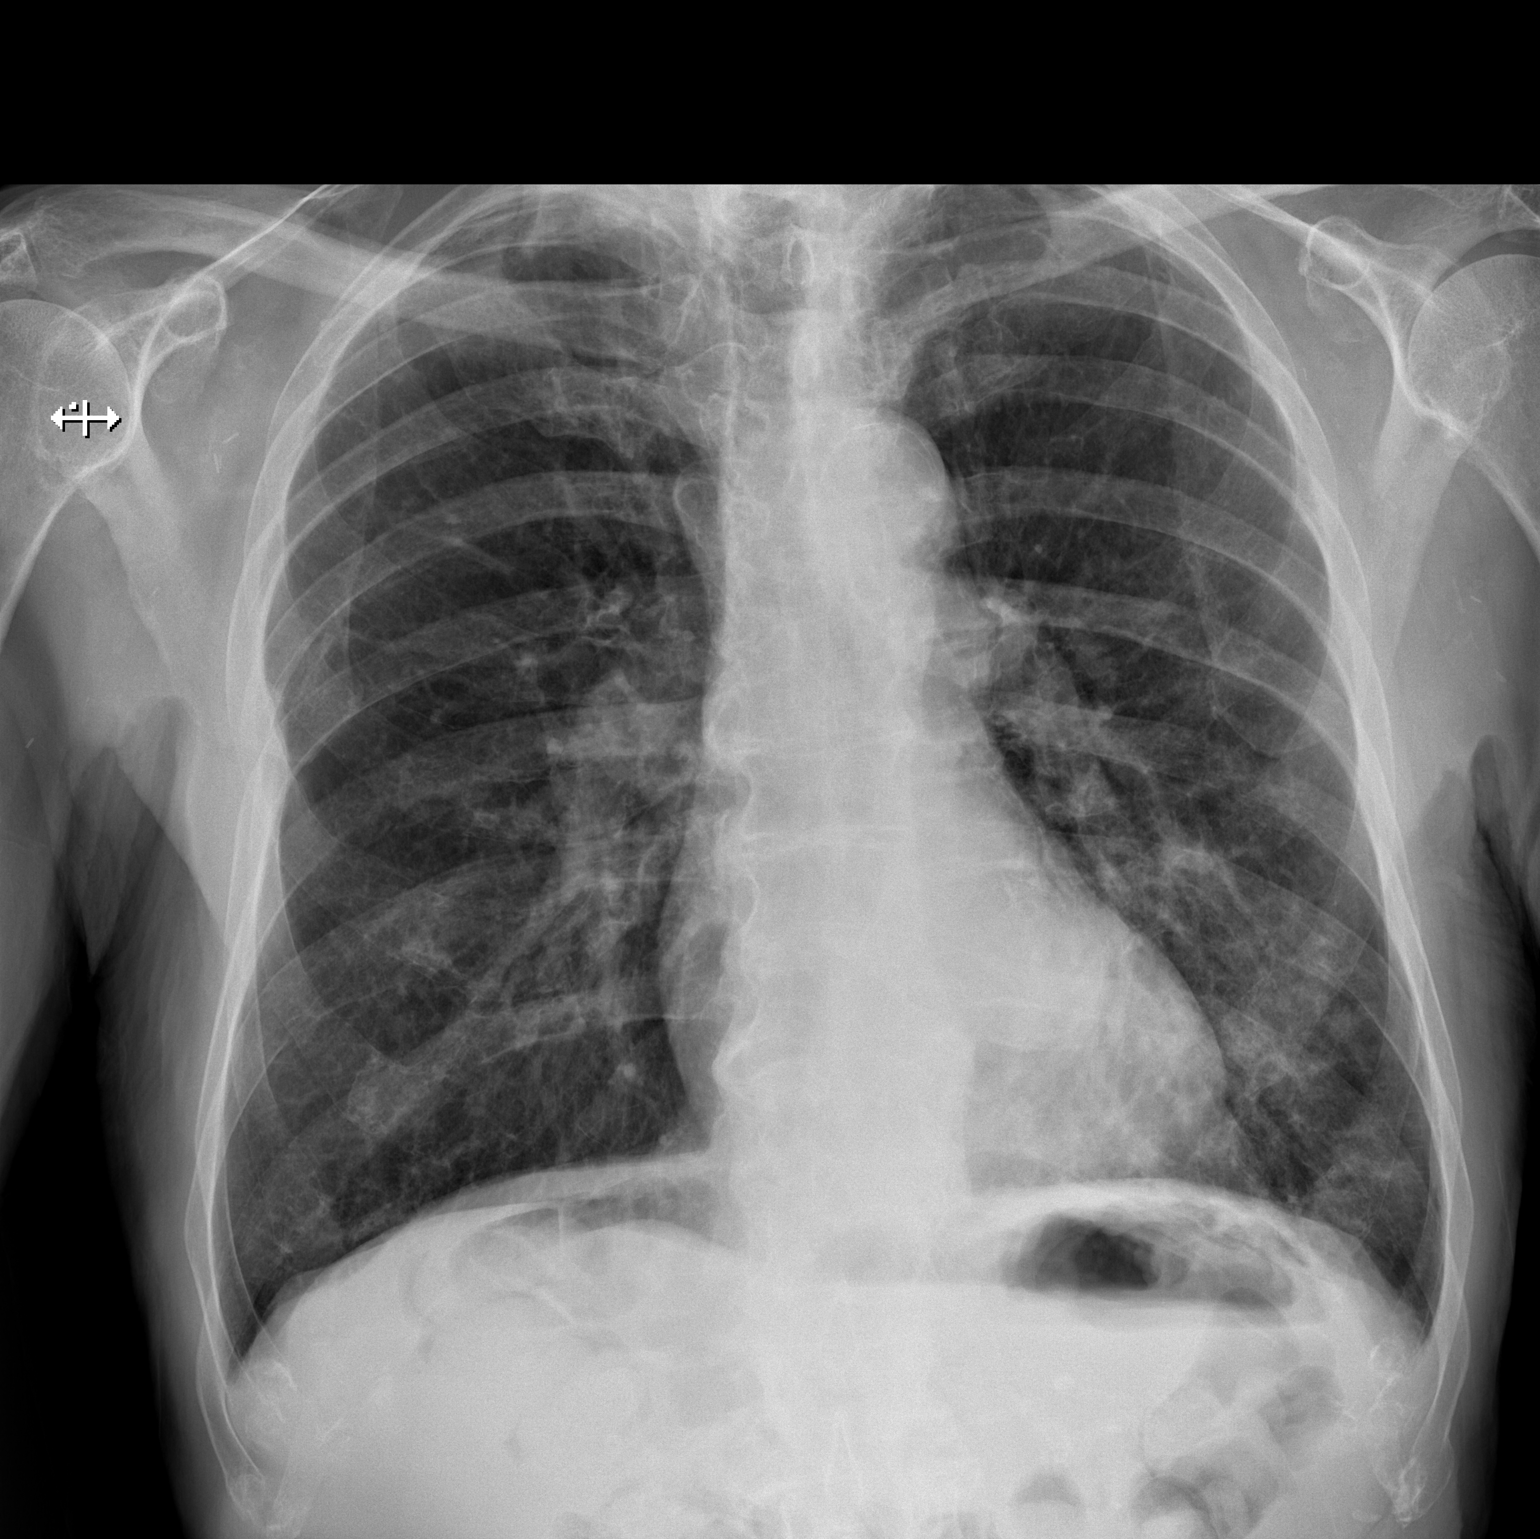

[w chest lat]
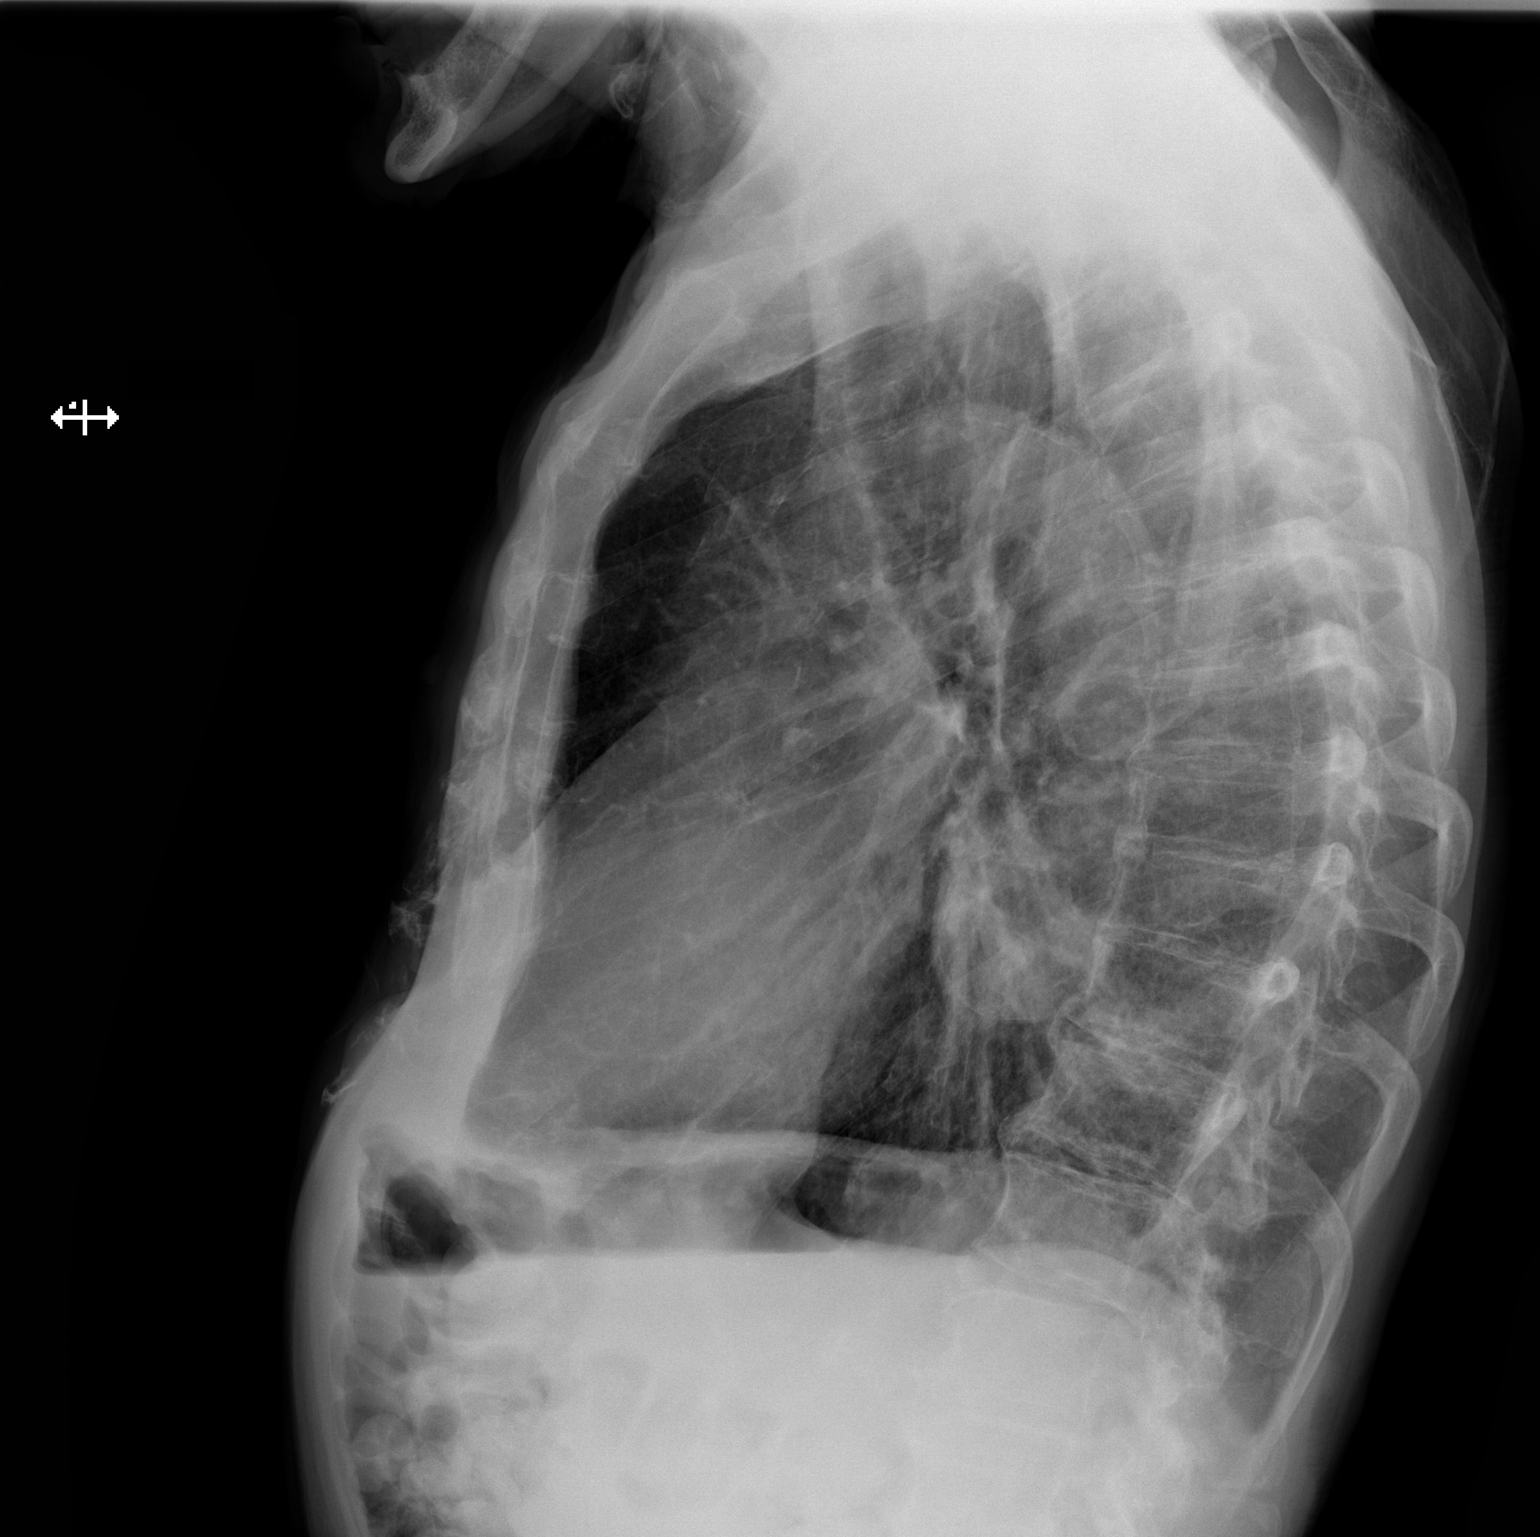

[2 of 2 positions shown; findings below may reference images not displayed]

FINDINGS: The lungs are mildly hyperinflated. The interstitial markings are
coarse bilaterally and are more conspicuous in the left lower lobe
today. The heart is normal in size. The pulmonary vascularity is not
engorged. There is calcification in the wall of the aortic arch.
There is no pleural effusion. There is mild multilevel degenerative
disc disease of the thoracic spine.
IMPRESSION: COPD and pulmonary fibrotic change. Mild interval increase in lower
lobe interstitial markings on the left may reflect atelectasis,
early pneumonia, or bronchiectatic changes. Followup PA and lateral
chest X-ray is recommended in 3-4 weeks following trial of
antibiotic therapy to ensure resolution and exclude underlying
malignancy.

Aortic atherosclerosis.

## 2016-12-22 IMAGING — DX DG ABDOMEN 2V
2 series · 2 of 2 positions shown · non-contrast
Comparison: Two days ago

CLINICAL DATA: Abdominal distention.  No bowel movement for 4 days.

EXAM:
ABDOMEN - 2 VIEW

[w abdomen upright]
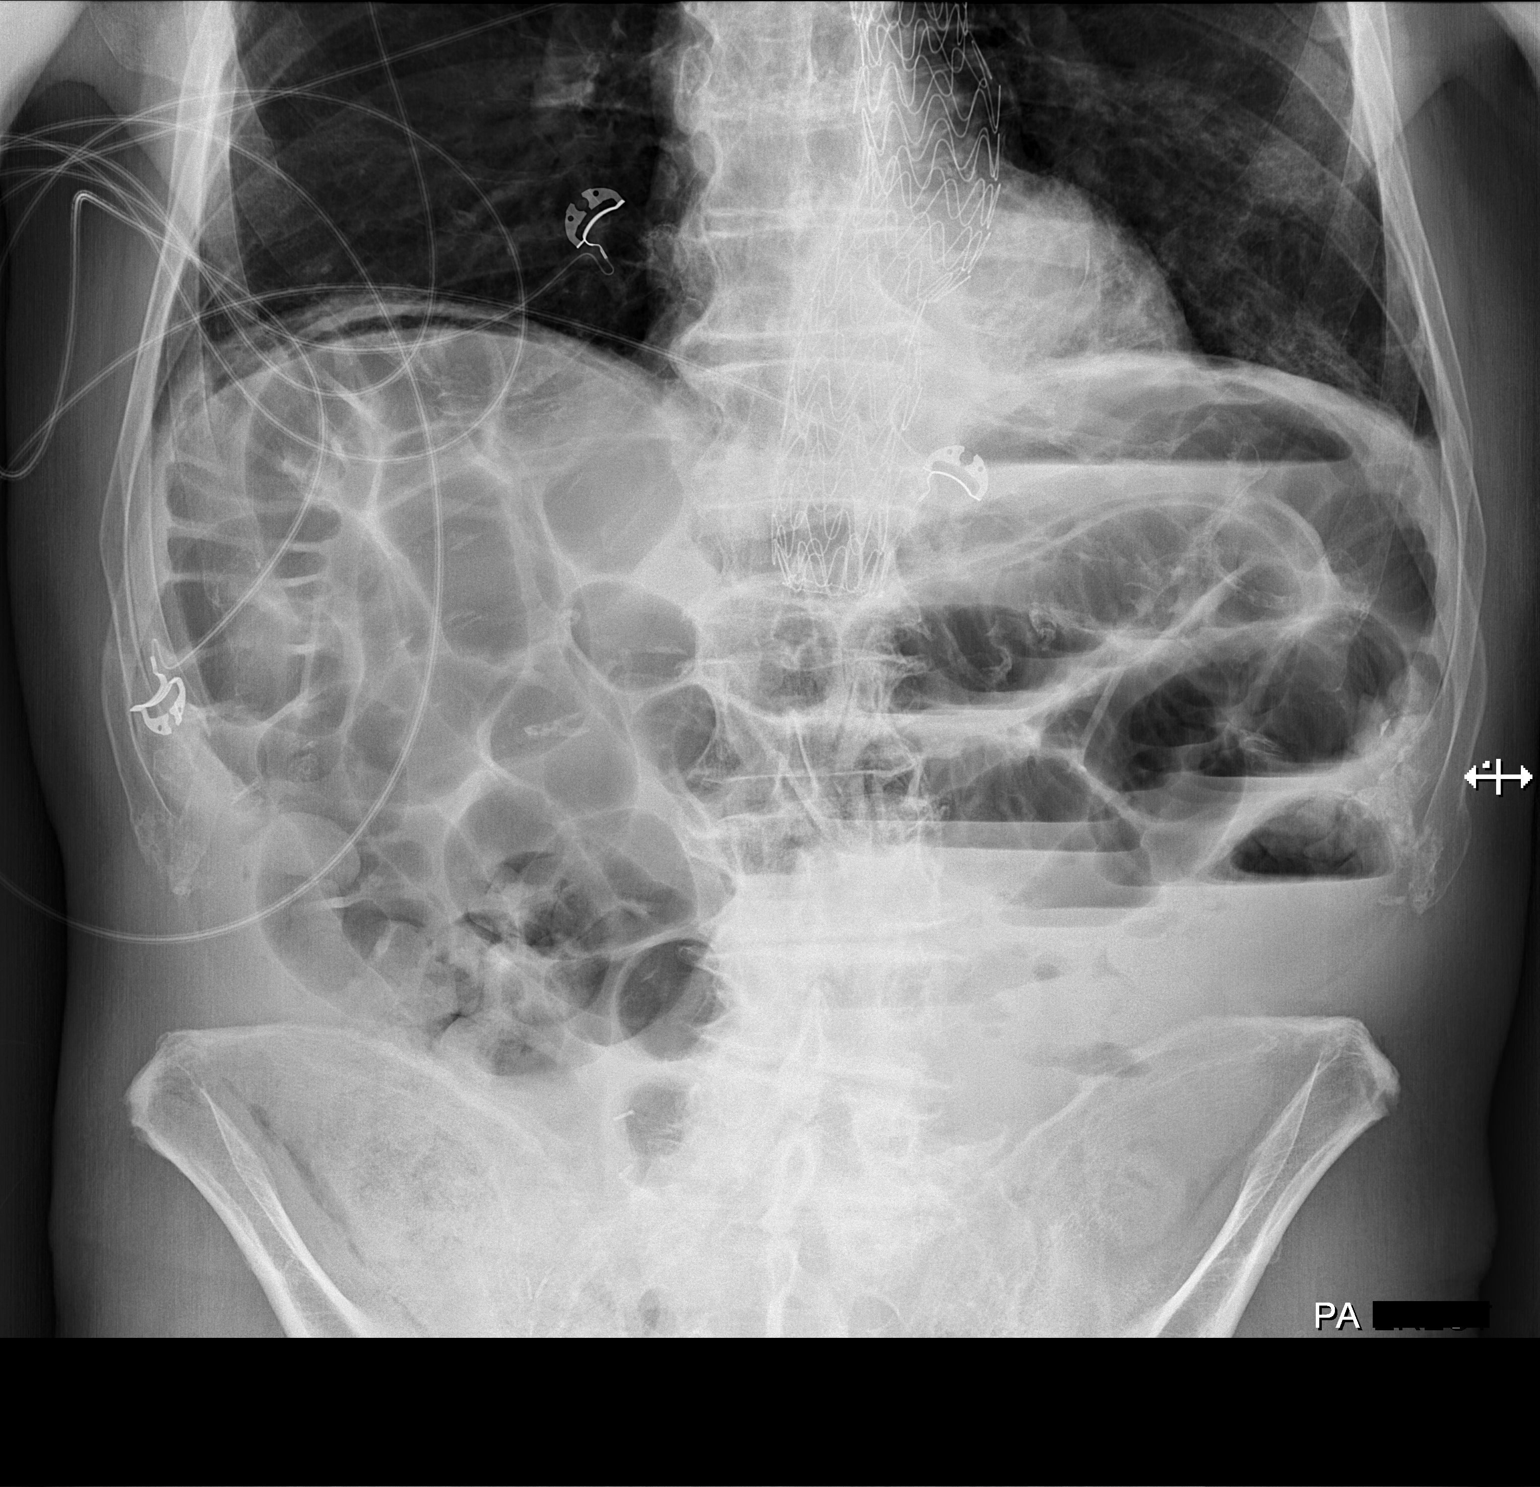

[t abdomen supine]
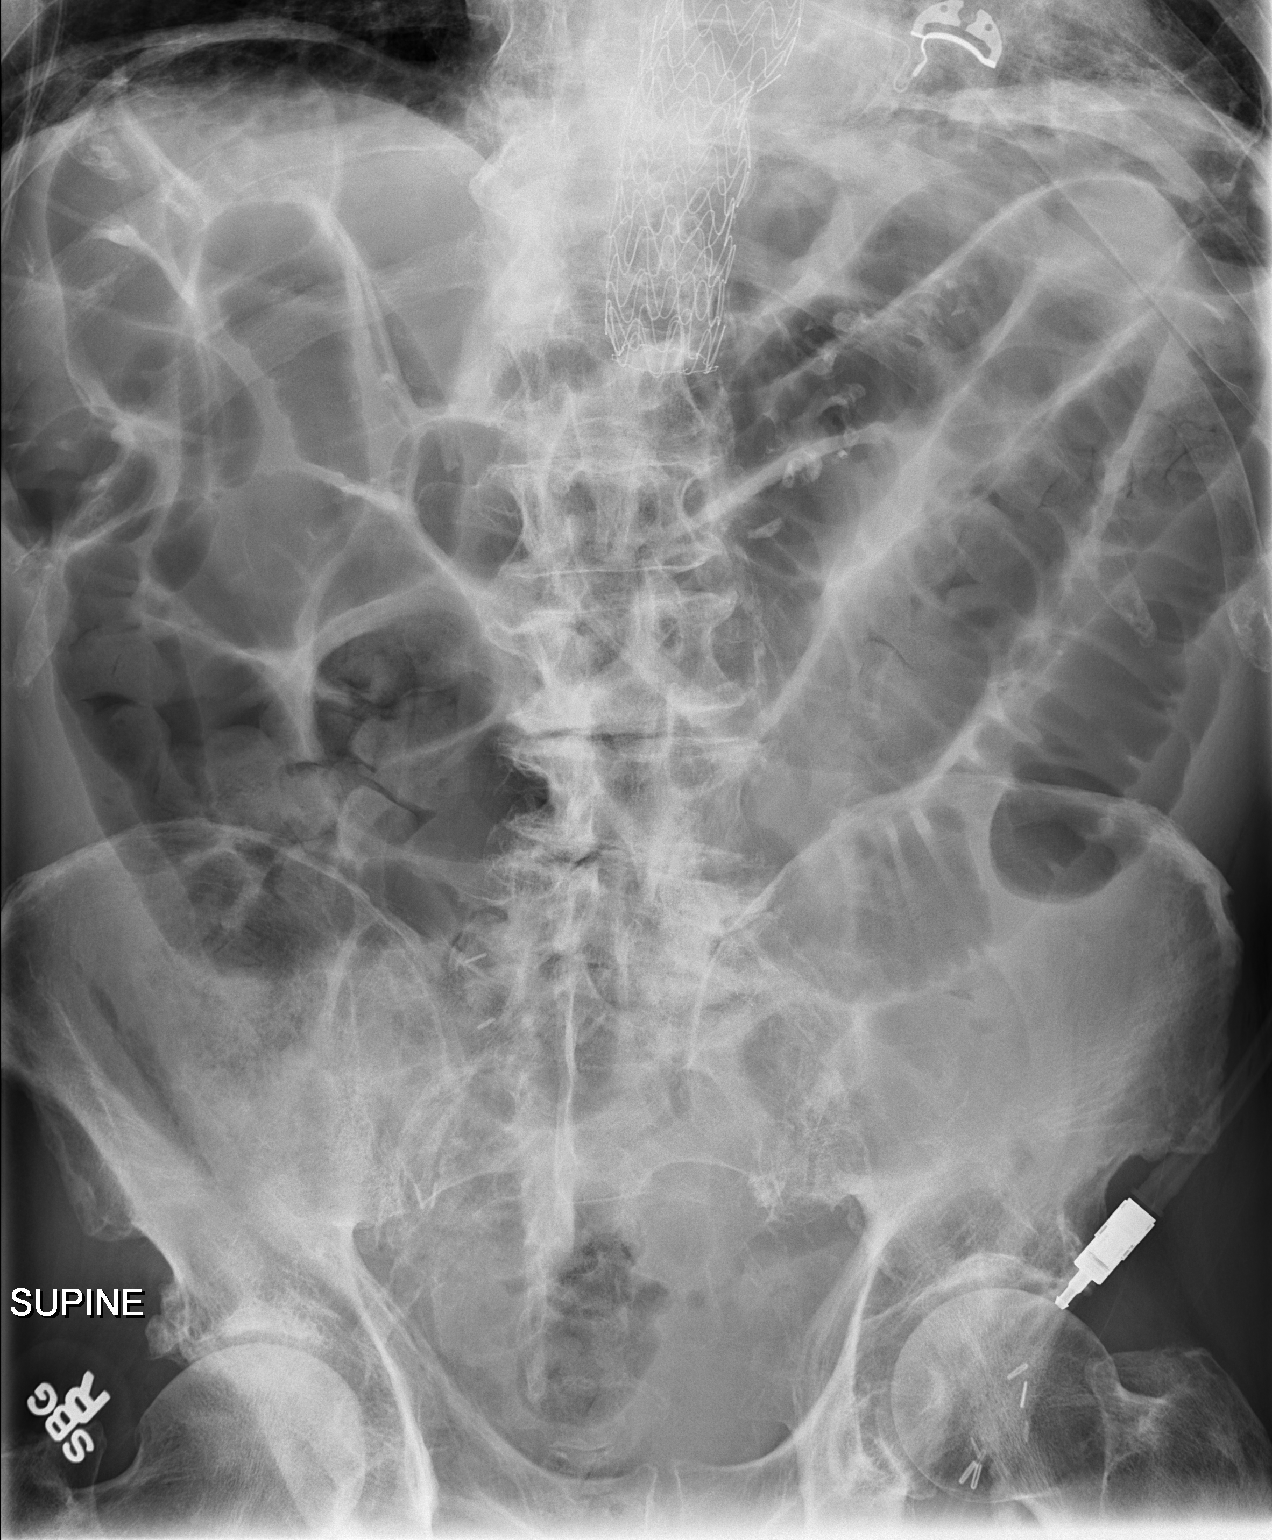

[2 of 2 positions shown; findings below may reference images not displayed]

FINDINGS: Ongoing gaseous distension of predominately small bowel loops.
Colonic loops are less distended. Some stool is seen along the left
colon. There is moderate pneumoperitoneum with Luigy sign on the
left and subdiaphragmatic gas on the right. Right-sided
diaphragmatic gas is similar to chest x-ray 2 days ago.
Retroperitoneal gas in the right iliac fossa is decreasing.
Asymmetric lung markings at the left base, which have been seen on
recent imaging, including 07/05/2016 chest CT.

These results were called by telephone at the time of interpretation
on 08/29/2016 at [DATE] to Dr. ROJUEL MATHERSON , who verbally
acknowledged these results.
IMPRESSION: 1. Unchanged gaseous bowel distention with fluid levels, likely
ileus.
2. Moderate pneumoperitoneum, also seen on chest x-ray 2 days ago.
Surgery was retroperitoneal approach but reportedly the peritoneum
was opened.

## 2016-12-24 IMAGING — CR DG ABDOMEN 1V
1 series · 1 of 1 positions shown · non-contrast
Comparison: KUB of 08/30/2016

CLINICAL DATA: Abdominal pain, abdominal distention, evaluate NG
tube placement

EXAM:
ABDOMEN - 1 VIEW

[abdomen kub]
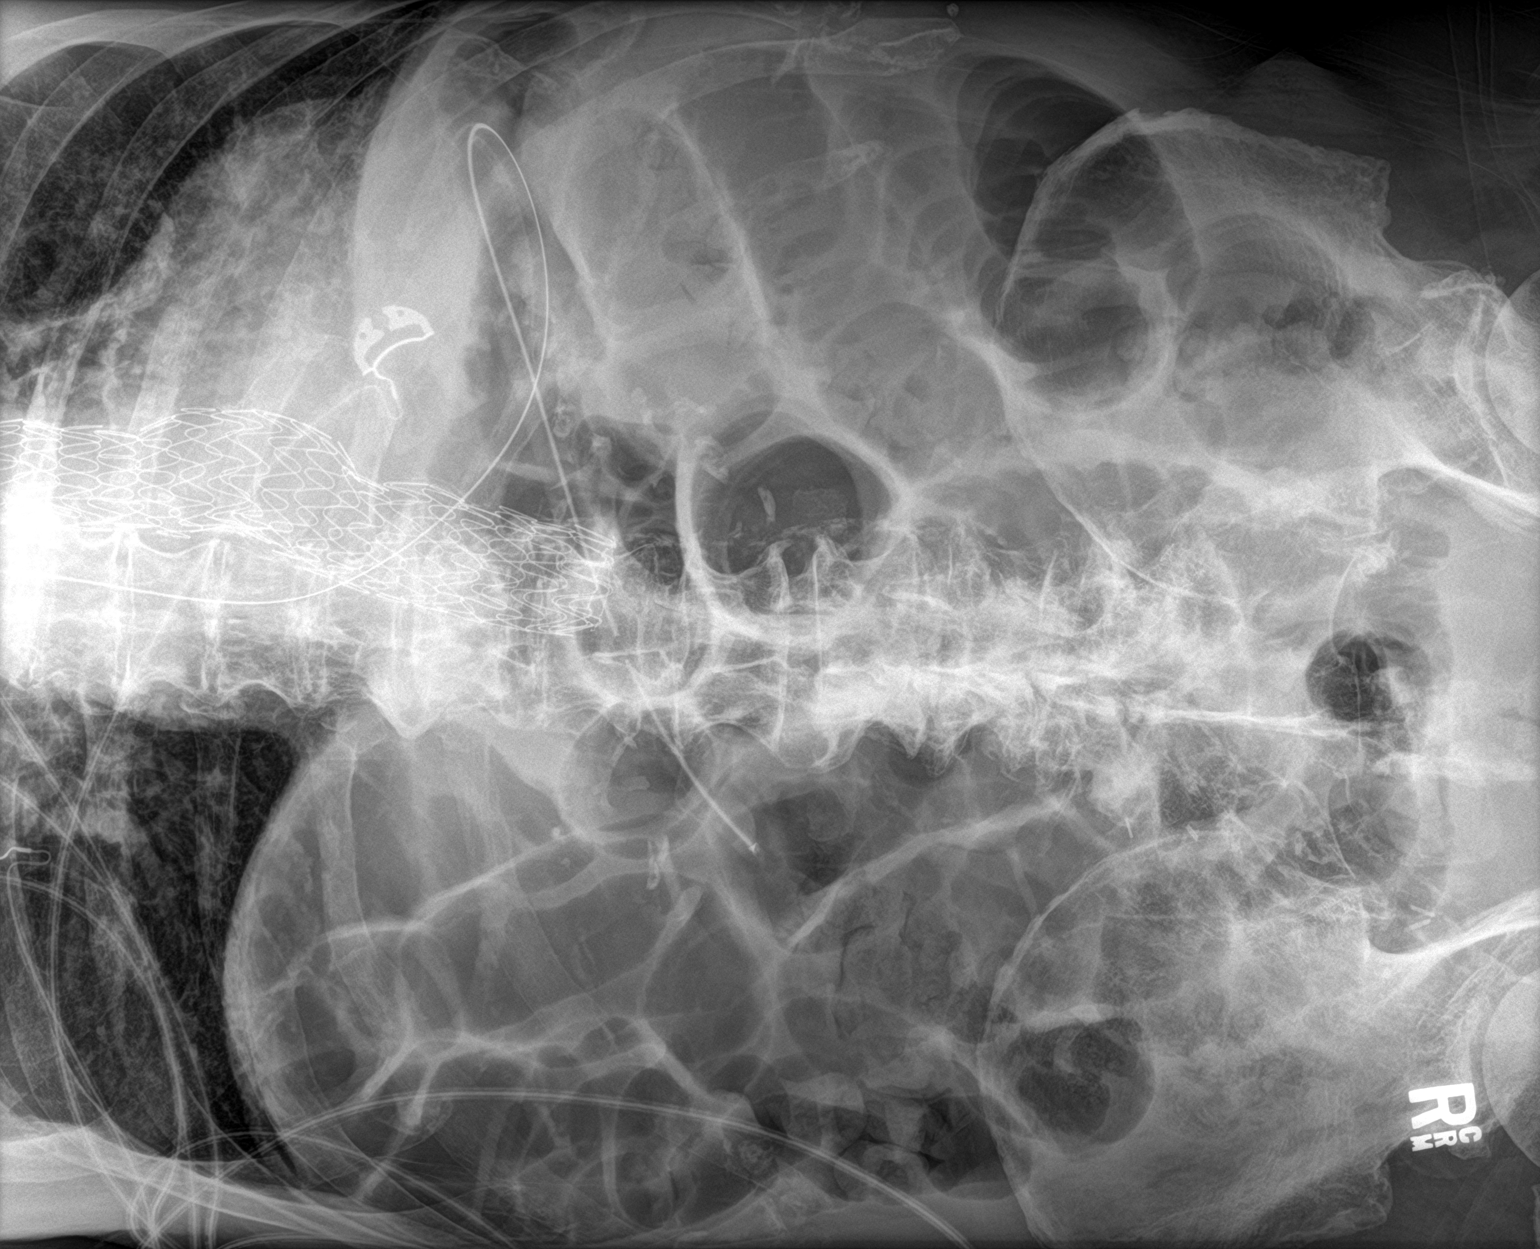

[1 of 1 positions shown; findings below may reference images not displayed]

FINDINGS: A supine film of the abdomen shows a nonspecific bowel gas pattern.
The NG tube is unchanged in position looping in the fundus with the
tip in the region of the distal antrum of the stomach. Some small
bowel distention is again noted most consistent with ileus. Partial
small bowel obstruction cannot be excluded. A lower thoracic aortic
endovascular stent remains.
IMPRESSION: 1. Tip of NG tube in the region of the distal antrum of the stomach.
2. No change in probable ileus. Cannot exclude partial small bowel
obstruction.
